# Patient Record
Sex: Male | Born: 1940 | Race: White | Hispanic: No | Marital: Single | Smoking: Current every day smoker
Health system: Southern US, Community
[De-identification: ages and names within clinical notes are randomized; demographics above are authoritative.]

## PROBLEM LIST (undated history)

## (undated) DIAGNOSIS — F17200 Nicotine dependence, unspecified, uncomplicated: Secondary | ICD-10-CM

## (undated) DIAGNOSIS — J449 Chronic obstructive pulmonary disease, unspecified: Secondary | ICD-10-CM

## (undated) DIAGNOSIS — Z86010 Personal history of colonic polyps: Secondary | ICD-10-CM

## (undated) DIAGNOSIS — R7309 Other abnormal glucose: Secondary | ICD-10-CM

## (undated) DIAGNOSIS — R042 Hemoptysis: Secondary | ICD-10-CM

## (undated) DIAGNOSIS — J438 Other emphysema: Secondary | ICD-10-CM

## (undated) DIAGNOSIS — Z8546 Personal history of malignant neoplasm of prostate: Secondary | ICD-10-CM

## (undated) DIAGNOSIS — E278 Other specified disorders of adrenal gland: Secondary | ICD-10-CM

## (undated) DIAGNOSIS — I10 Essential (primary) hypertension: Secondary | ICD-10-CM

## (undated) DIAGNOSIS — K219 Gastro-esophageal reflux disease without esophagitis: Secondary | ICD-10-CM

## (undated) HISTORY — DX: Gastro-esophageal reflux disease without esophagitis: K21.9

## (undated) HISTORY — DX: Essential (primary) hypertension: I10

## (undated) HISTORY — DX: Other specified disorders of adrenal gland: E27.8

## (undated) HISTORY — PX: CATARACT EXTRACTION: SUR2

## (undated) HISTORY — DX: Personal history of malignant neoplasm of prostate: Z85.46

## (undated) HISTORY — DX: Hemoptysis: R04.2

## (undated) HISTORY — PX: APPENDECTOMY: SHX54

## (undated) HISTORY — PX: PROSTATE SURGERY: SHX751

## (undated) HISTORY — DX: Chronic obstructive pulmonary disease, unspecified: J44.9

## (undated) HISTORY — DX: Personal history of colonic polyps: Z86.010

## (undated) HISTORY — DX: Other abnormal glucose: R73.09

## (undated) HISTORY — DX: Other emphysema: J43.8

## (undated) HISTORY — DX: Nicotine dependence, unspecified, uncomplicated: F17.200

---

## 2004-03-06 ENCOUNTER — Ambulatory Visit: Payer: Self-pay | Admitting: Internal Medicine

## 2004-03-17 ENCOUNTER — Ambulatory Visit: Payer: Self-pay | Admitting: Internal Medicine

## 2004-03-31 ENCOUNTER — Ambulatory Visit: Payer: Self-pay | Admitting: Internal Medicine

## 2004-06-17 ENCOUNTER — Ambulatory Visit: Payer: Self-pay | Admitting: Internal Medicine

## 2005-03-23 ENCOUNTER — Ambulatory Visit: Payer: Self-pay | Admitting: Internal Medicine

## 2006-05-18 ENCOUNTER — Ambulatory Visit: Payer: Self-pay | Admitting: Internal Medicine

## 2006-05-18 LAB — CONVERTED CEMR LAB
ALT: 17 units/L (ref 0–40)
AST: 17 units/L (ref 0–37)
Albumin: 4 g/dL (ref 3.5–5.2)
Alkaline Phosphatase: 92 units/L (ref 39–117)
BUN: 9 mg/dL (ref 6–23)
Basophils Absolute: 0 10*3/uL (ref 0.0–0.1)
Basophils Relative: 0 % (ref 0.0–1.0)
Bilirubin, Direct: 0.2 mg/dL (ref 0.0–0.3)
CO2: 29 meq/L (ref 19–32)
Calcium: 8.9 mg/dL (ref 8.4–10.5)
Chloride: 104 meq/L (ref 96–112)
Cholesterol: 148 mg/dL (ref 0–200)
Creatinine, Ser: 0.9 mg/dL (ref 0.4–1.5)
Eosinophils Absolute: 0.2 10*3/uL (ref 0.0–0.6)
Eosinophils Relative: 2.1 % (ref 0.0–5.0)
GFR calc Af Amer: 109 mL/min
GFR calc non Af Amer: 90 mL/min
Glucose, Bld: 90 mg/dL (ref 70–99)
HCT: 45.3 % (ref 39.0–52.0)
HDL: 26.5 mg/dL — ABNORMAL LOW (ref >39.0)
Hemoglobin: 16.1 g/dL (ref 13.0–17.0)
LDL Cholesterol: 95 mg/dL (ref 0–99)
Lymphocytes Relative: 20.7 % (ref 12.0–46.0)
MCHC: 35.4 g/dL (ref 30.0–36.0)
MCV: 92.1 fL (ref 78.0–100.0)
Monocytes Absolute: 0.6 10*3/uL (ref 0.2–0.7)
Monocytes Relative: 7.1 % (ref 3.0–11.0)
Neutro Abs: 5.4 10*3/uL (ref 1.4–7.7)
Neutrophils Relative %: 70.1 % (ref 43.0–77.0)
PSA: 5.14 ng/mL — ABNORMAL HIGH (ref 0.10–4.00)
Platelets: 268 10*3/uL (ref 150–400)
Potassium: 3.8 meq/L (ref 3.5–5.1)
RBC: 4.92 M/uL (ref 4.22–5.81)
RDW: 12.9 % (ref 11.5–14.6)
Sodium: 139 meq/L (ref 135–145)
TSH: 1.35 microintl units/mL (ref 0.35–5.50)
Total Bilirubin: 1 mg/dL (ref 0.3–1.2)
Total CHOL/HDL Ratio: 5.6
Total Protein: 7.3 g/dL (ref 6.0–8.3)
Triglycerides: 132 mg/dL (ref 0–149)
VLDL: 26 mg/dL (ref 0–40)
WBC: 7.8 10*3/uL (ref 4.5–10.5)

## 2006-06-14 ENCOUNTER — Ambulatory Visit: Payer: Self-pay | Admitting: Internal Medicine

## 2006-06-14 LAB — CONVERTED CEMR LAB
PSA, Free Pct: 9 — ABNORMAL LOW (ref >25)
PSA, Free: 0.4 ng/mL
PSA: 4.67 ng/mL — ABNORMAL HIGH (ref 0.10–4.00)

## 2006-09-15 ENCOUNTER — Inpatient Hospital Stay (HOSPITAL_COMMUNITY): Admission: RE | Admit: 2006-09-15 | Discharge: 2006-09-16 | Payer: Self-pay | Admitting: Urology

## 2006-09-15 ENCOUNTER — Encounter: Payer: Self-pay | Admitting: Urology

## 2006-10-11 DIAGNOSIS — D126 Benign neoplasm of colon, unspecified: Secondary | ICD-10-CM | POA: Insufficient documentation

## 2006-10-11 DIAGNOSIS — Z860100 Personal history of colon polyps, unspecified: Secondary | ICD-10-CM

## 2006-10-11 HISTORY — DX: Personal history of colonic polyps: Z86.010

## 2006-10-11 HISTORY — DX: Personal history of colon polyps, unspecified: Z86.0100

## 2007-01-30 ENCOUNTER — Ambulatory Visit: Payer: Self-pay | Admitting: Internal Medicine

## 2007-01-30 DIAGNOSIS — F17200 Nicotine dependence, unspecified, uncomplicated: Secondary | ICD-10-CM

## 2007-01-30 DIAGNOSIS — Z8546 Personal history of malignant neoplasm of prostate: Secondary | ICD-10-CM

## 2007-01-30 HISTORY — DX: Personal history of malignant neoplasm of prostate: Z85.46

## 2007-01-30 HISTORY — DX: Nicotine dependence, unspecified, uncomplicated: F17.200

## 2007-02-01 ENCOUNTER — Encounter: Payer: Self-pay | Admitting: Internal Medicine

## 2007-05-02 ENCOUNTER — Ambulatory Visit: Payer: Self-pay | Admitting: Internal Medicine

## 2007-05-03 ENCOUNTER — Telehealth: Payer: Self-pay | Admitting: Internal Medicine

## 2007-05-03 LAB — CONVERTED CEMR LAB
ALT: 14 units/L (ref 0–53)
AST: 15 units/L (ref 0–37)
Alkaline Phosphatase: 97 units/L (ref 39–117)
Basophils Absolute: 0 10*3/uL (ref 0.0–0.1)
Basophils Relative: 0.1 % (ref 0.0–1.0)
Bilirubin, Direct: 0.3 mg/dL (ref 0.0–0.3)
CO2: 31 meq/L (ref 19–32)
Chloride: 102 meq/L (ref 96–112)
Creatinine, Ser: 0.9 mg/dL (ref 0.4–1.5)
Eosinophils Absolute: 0.2 10*3/uL (ref 0.0–0.7)
GFR calc non Af Amer: 89 mL/min
HDL: 24.6 mg/dL — ABNORMAL LOW (ref >39.0)
LDL Cholesterol: 103 mg/dL — ABNORMAL HIGH (ref 0–99)
Lymphocytes Relative: 17.8 % (ref 12.0–46.0)
MCHC: 33.7 g/dL (ref 30.0–36.0)
MCV: 94.9 fL (ref 78.0–100.0)
Neutrophils Relative %: 71.5 % (ref 43.0–77.0)
Platelets: 250 10*3/uL (ref 150–400)
Potassium: 4.2 meq/L (ref 3.5–5.1)
RBC: 4.92 M/uL (ref 4.22–5.81)
Total Bilirubin: 1.3 mg/dL — ABNORMAL HIGH (ref 0.3–1.2)
VLDL: 15 mg/dL (ref 0–40)
WBC: 6.9 10*3/uL (ref 4.5–10.5)

## 2007-05-04 ENCOUNTER — Ambulatory Visit: Payer: Self-pay | Admitting: Internal Medicine

## 2007-05-08 ENCOUNTER — Encounter: Payer: Self-pay | Admitting: Internal Medicine

## 2007-05-15 ENCOUNTER — Ambulatory Visit: Payer: Self-pay | Admitting: Internal Medicine

## 2007-05-15 ENCOUNTER — Telehealth: Payer: Self-pay | Admitting: Internal Medicine

## 2007-08-04 ENCOUNTER — Ambulatory Visit: Payer: Self-pay | Admitting: Internal Medicine

## 2007-08-04 DIAGNOSIS — I10 Essential (primary) hypertension: Secondary | ICD-10-CM | POA: Insufficient documentation

## 2007-08-04 HISTORY — DX: Essential (primary) hypertension: I10

## 2007-08-25 ENCOUNTER — Encounter: Payer: Self-pay | Admitting: Internal Medicine

## 2007-10-10 ENCOUNTER — Ambulatory Visit: Payer: Self-pay | Admitting: Internal Medicine

## 2007-12-07 ENCOUNTER — Telehealth: Payer: Self-pay | Admitting: Internal Medicine

## 2008-02-09 ENCOUNTER — Ambulatory Visit: Payer: Self-pay | Admitting: Family Medicine

## 2008-02-21 ENCOUNTER — Encounter: Payer: Self-pay | Admitting: Internal Medicine

## 2008-04-11 ENCOUNTER — Ambulatory Visit: Payer: Self-pay | Admitting: Internal Medicine

## 2008-04-12 LAB — CONVERTED CEMR LAB
BUN: 12 mg/dL (ref 6–23)
CO2: 33 meq/L — ABNORMAL HIGH (ref 19–32)
Calcium: 9.2 mg/dL (ref 8.4–10.5)
Chloride: 98 meq/L (ref 96–112)
Creatinine, Ser: 1 mg/dL (ref 0.4–1.5)
Glucose, Bld: 132 mg/dL — ABNORMAL HIGH (ref 70–99)

## 2008-07-22 ENCOUNTER — Telehealth: Payer: Self-pay | Admitting: Internal Medicine

## 2008-08-19 ENCOUNTER — Encounter: Payer: Self-pay | Admitting: Internal Medicine

## 2008-10-01 ENCOUNTER — Ambulatory Visit: Payer: Self-pay | Admitting: Internal Medicine

## 2008-10-02 LAB — CONVERTED CEMR LAB
BUN: 18 mg/dL (ref 6–23)
Calcium: 8.8 mg/dL (ref 8.4–10.5)
Creatinine, Ser: 1.1 mg/dL (ref 0.4–1.5)
GFR calc non Af Amer: 70.62 mL/min (ref >60)
Glucose, Bld: 104 mg/dL — ABNORMAL HIGH (ref 70–99)
Potassium: 3.2 meq/L — ABNORMAL LOW (ref 3.5–5.1)

## 2008-12-17 ENCOUNTER — Emergency Department (HOSPITAL_COMMUNITY): Admission: EM | Admit: 2008-12-17 | Discharge: 2008-12-17 | Payer: Self-pay | Admitting: Emergency Medicine

## 2008-12-17 ENCOUNTER — Encounter (INDEPENDENT_AMBULATORY_CARE_PROVIDER_SITE_OTHER): Payer: Self-pay | Admitting: *Deleted

## 2008-12-17 ENCOUNTER — Telehealth: Payer: Self-pay | Admitting: Internal Medicine

## 2008-12-24 ENCOUNTER — Ambulatory Visit: Payer: Self-pay | Admitting: Family Medicine

## 2009-01-06 ENCOUNTER — Ambulatory Visit: Payer: Self-pay | Admitting: Internal Medicine

## 2009-02-28 ENCOUNTER — Ambulatory Visit: Payer: Self-pay | Admitting: Internal Medicine

## 2009-03-10 ENCOUNTER — Telehealth: Payer: Self-pay | Admitting: Internal Medicine

## 2009-03-11 ENCOUNTER — Ambulatory Visit: Payer: Self-pay | Admitting: Internal Medicine

## 2009-03-31 ENCOUNTER — Ambulatory Visit: Payer: Self-pay | Admitting: Internal Medicine

## 2009-03-31 DIAGNOSIS — J441 Chronic obstructive pulmonary disease with (acute) exacerbation: Secondary | ICD-10-CM

## 2009-03-31 DIAGNOSIS — J449 Chronic obstructive pulmonary disease, unspecified: Secondary | ICD-10-CM

## 2009-03-31 DIAGNOSIS — J4489 Other specified chronic obstructive pulmonary disease: Secondary | ICD-10-CM

## 2009-03-31 HISTORY — DX: Other specified chronic obstructive pulmonary disease: J44.89

## 2009-03-31 HISTORY — DX: Chronic obstructive pulmonary disease, unspecified: J44.9

## 2009-04-01 LAB — CONVERTED CEMR LAB
CO2: 34 meq/L — ABNORMAL HIGH (ref 19–32)
Calcium: 8.9 mg/dL (ref 8.4–10.5)
Creatinine, Ser: 1 mg/dL (ref 0.4–1.5)
GFR calc non Af Amer: 78.72 mL/min (ref >60)
Sodium: 142 meq/L (ref 135–145)

## 2009-05-06 ENCOUNTER — Telehealth: Payer: Self-pay | Admitting: Internal Medicine

## 2009-06-11 ENCOUNTER — Ambulatory Visit: Payer: Self-pay | Admitting: Internal Medicine

## 2009-06-12 LAB — CONVERTED CEMR LAB
Basophils Absolute: 0 10*3/uL (ref 0.0–0.1)
Basophils Relative: 0.1 % (ref 0.0–3.0)
Eosinophils Absolute: 0.1 10*3/uL (ref 0.0–0.7)
Lipase: 21 units/L (ref 11.0–59.0)
Lymphocytes Relative: 7.2 % — ABNORMAL LOW (ref 12.0–46.0)
MCHC: 35.4 g/dL (ref 30.0–36.0)
Monocytes Relative: 4.3 % (ref 3.0–12.0)
Neutrophils Relative %: 88 % — ABNORMAL HIGH (ref 43.0–77.0)
RBC: 5.04 M/uL (ref 4.22–5.81)

## 2009-06-13 ENCOUNTER — Telehealth: Payer: Self-pay | Admitting: Internal Medicine

## 2009-07-30 ENCOUNTER — Telehealth: Payer: Self-pay | Admitting: Internal Medicine

## 2009-07-30 DIAGNOSIS — R042 Hemoptysis: Secondary | ICD-10-CM | POA: Insufficient documentation

## 2009-07-30 HISTORY — DX: Hemoptysis: R04.2

## 2009-07-31 ENCOUNTER — Ambulatory Visit: Payer: Self-pay | Admitting: Internal Medicine

## 2009-08-10 ENCOUNTER — Encounter (INDEPENDENT_AMBULATORY_CARE_PROVIDER_SITE_OTHER): Payer: Self-pay | Admitting: *Deleted

## 2009-08-10 ENCOUNTER — Emergency Department (HOSPITAL_COMMUNITY): Admission: EM | Admit: 2009-08-10 | Discharge: 2009-08-10 | Payer: Self-pay | Admitting: Emergency Medicine

## 2009-08-12 ENCOUNTER — Ambulatory Visit: Payer: Self-pay | Admitting: Family Medicine

## 2009-08-12 DIAGNOSIS — E278 Other specified disorders of adrenal gland: Secondary | ICD-10-CM

## 2009-08-12 DIAGNOSIS — R739 Hyperglycemia, unspecified: Secondary | ICD-10-CM

## 2009-08-12 DIAGNOSIS — R7309 Other abnormal glucose: Secondary | ICD-10-CM

## 2009-08-12 HISTORY — DX: Other abnormal glucose: R73.09

## 2009-08-12 HISTORY — DX: Other specified disorders of adrenal gland: E27.8

## 2009-08-12 LAB — CONVERTED CEMR LAB
CO2: 32 meq/L (ref 19–32)
Chloride: 105 meq/L (ref 96–112)
Eosinophils Relative: 2.5 % (ref 0.0–5.0)
Glucose, Bld: 101 mg/dL — ABNORMAL HIGH (ref 70–99)
HCT: 43.3 % (ref 39.0–52.0)
Monocytes Relative: 9.4 % (ref 3.0–12.0)
Neutrophils Relative %: 75.1 % (ref 43.0–77.0)
Platelets: 268 10*3/uL (ref 150.0–400.0)
Potassium: 4.2 meq/L (ref 3.5–5.1)
RBC: 4.48 M/uL (ref 4.22–5.81)
Sodium: 140 meq/L (ref 135–145)
WBC: 8.4 10*3/uL (ref 4.5–10.5)

## 2009-08-15 ENCOUNTER — Ambulatory Visit: Payer: Self-pay | Admitting: Internal Medicine

## 2009-08-18 ENCOUNTER — Encounter: Admission: RE | Admit: 2009-08-18 | Discharge: 2009-08-18 | Payer: Self-pay | Admitting: Internal Medicine

## 2009-08-18 ENCOUNTER — Telehealth: Payer: Self-pay | Admitting: Internal Medicine

## 2009-09-09 ENCOUNTER — Ambulatory Visit: Payer: Self-pay | Admitting: Internal Medicine

## 2009-09-09 DIAGNOSIS — K219 Gastro-esophageal reflux disease without esophagitis: Secondary | ICD-10-CM

## 2009-09-09 HISTORY — DX: Gastro-esophageal reflux disease without esophagitis: K21.9

## 2009-09-24 ENCOUNTER — Encounter: Payer: Self-pay | Admitting: Internal Medicine

## 2009-10-02 ENCOUNTER — Ambulatory Visit: Payer: Self-pay | Admitting: Internal Medicine

## 2009-10-03 LAB — CONVERTED CEMR LAB
AST: 15 units/L (ref 0–37)
Basophils Relative: 0.6 % (ref 0.0–3.0)
CO2: 30 meq/L (ref 19–32)
Calcium: 9.1 mg/dL (ref 8.4–10.5)
Eosinophils Relative: 3.3 % (ref 0.0–5.0)
Glucose, Bld: 99 mg/dL (ref 70–99)
HCT: 42.9 % (ref 39.0–52.0)
HDL: 25 mg/dL — ABNORMAL LOW (ref >39.00)
Hemoglobin: 14.8 g/dL (ref 13.0–17.0)
Lymphs Abs: 1 10*3/uL (ref 0.7–4.0)
MCV: 96.6 fL (ref 78.0–100.0)
Monocytes Absolute: 0.5 10*3/uL (ref 0.1–1.0)
Neutrophils Relative %: 69.7 % (ref 43.0–77.0)
RBC: 4.44 M/uL (ref 4.22–5.81)
Sodium: 142 meq/L (ref 135–145)
Total Bilirubin: 0.9 mg/dL (ref 0.3–1.2)
Total CHOL/HDL Ratio: 5
WBC: 5.8 10*3/uL (ref 4.5–10.5)

## 2009-10-08 ENCOUNTER — Ambulatory Visit: Payer: Self-pay | Admitting: Internal Medicine

## 2009-10-08 LAB — HM COLONOSCOPY

## 2009-10-10 ENCOUNTER — Ambulatory Visit: Payer: Self-pay | Admitting: Cardiology

## 2009-10-12 ENCOUNTER — Encounter: Payer: Self-pay | Admitting: Internal Medicine

## 2009-10-16 ENCOUNTER — Ambulatory Visit: Payer: Self-pay | Admitting: Pulmonary Disease

## 2009-10-16 DIAGNOSIS — J438 Other emphysema: Secondary | ICD-10-CM

## 2009-10-16 DIAGNOSIS — R930 Abnormal findings on diagnostic imaging of skull and head, not elsewhere classified: Secondary | ICD-10-CM

## 2009-10-16 HISTORY — DX: Other emphysema: J43.8

## 2009-10-21 ENCOUNTER — Ambulatory Visit: Payer: Self-pay | Admitting: Pulmonary Disease

## 2010-02-09 ENCOUNTER — Ambulatory Visit: Payer: Self-pay | Admitting: Cardiology

## 2010-02-24 NOTE — Assessment & Plan Note (Signed)
Summary: congestion/fever/dm   Vital Signs:  Patient profile:   70 year old male Temp:     98.4 degrees F Pulse rate:   68 / minute Resp:     12 per minute BP sitting:   122 / 64  (left arm)  Vitals Entered By: Gladis Riffle, RN (February 28, 2009 12:32 PM) CC: c/o head congestion, cough, fever 102 yesterday; sxs since 02/25/09 Is Patient Diabetic? No   CC:  c/o head congestion, cough, and fever 102 yesterday; sxs since 02/25/09.  History of Present Illness: 3 day hx of illness sudden onset fever chills, sinus congestion.  sxs improving but developed a cough---bothersome at night sinus sxs improved with OTC sinus meds/spray Fever up to 58 Son and grandsons with similar sxs 2 weeks  All other systems reviewed and were negative   Preventive Screening-Counseling & Management  Alcohol-Tobacco     Smoking Status: current     Smoking Cessation Counseling: yes     Packs/Day: 0.5     Year Started: 1958  Current Problems (verified): 1)  Hypertension  (ICD-401.9) 2)  Preventive Health Care  (ICD-V70.0) 3)  Prostate Cancer, Hx of  (ICD-V10.46) 4)  Tobacco Use  (ICD-305.1) 5)  Colonic Polyps, Hx of  (ICD-V12.72)  Current Medications (verified): 1)  Aspirin 81 Mg  Tbec (Aspirin) .... One By Mouth Every Day 2)  Lisinopril 20 Mg Tabs (Lisinopril) .... Take 1 Tablet By Mouth Once A Day 3)  Ibuprofen 200 Mg Tabs (Ibuprofen) .... As Needed  Allergies: 1)  ! Pcn  Past History:  Past Medical History: Last updated: 01/06/2009 Colonic polyps, hx of Prostate cancer, hx of-8/08 Hypertension tobacco abuse history of hemoptysis  Past Surgical History: Last updated: 04/11/2008 Appendectomy Prostatectomy 09/02/2006  Family History: Last updated: 08/04/2007 Family History of Arthritis Family History Breast cancer 1st degree relative <50 Family History of Colon CA 1st degree relative <60 Family History Lung cancer Family History of Stroke M 1st degree relative <50 Family History  Hypertension-mother  Social History: Last updated: 02/09/2008 Occupation: Single Retired Current Smoker  Risk Factors: Smoking Status: current (02/28/2009) Packs/Day: 0.5 (02/28/2009)  Social History: Packs/Day:  0.5  Physical Exam  General:  Well-developed,well-nourished,in no acute distress; alert,appropriate and cooperative throughout examination Head:  normocephalic and atraumatic.   Eyes:  pupils equal and pupils round.   Neck:  No deformities, masses, or tenderness noted. Chest Wall:  No deformities, masses, tenderness or gynecomastia noted. Lungs:  Normal respiratory effort, chest expands symmetrically. Lungs are clear to auscultation, no crackles or wheezes. Heart:  Normal rate and regular rhythm. S1 and S2 normal without gallop, murmur, click, rub or other extra sounds.   Impression & Recommendations:  Problem # 1:  INFLUENZA, WITH RESPIRATORY SYMPTOMS (ICD-487.1) by hx symptomatic care only side effects discussed call for increased sxs  Complete Medication List: 1)  Aspirin 81 Mg Tbec (Aspirin) .... One by mouth every day 2)  Lisinopril 20 Mg Tabs (Lisinopril) .... Take 1 tablet by mouth once a day 3)  Ibuprofen 200 Mg Tabs (Ibuprofen) .... As needed 4)  Hydromet 5-1.5 Mg/37ml Syrp (Hydrocodone-homatropine) .[name].. 1 tsp three times a day as needed cough Prescriptions: HYDROMET 5-1.5 MG/5ML SYRP (HYDROCODONE-HOMATROPINE) 1 tsp three times a day as needed cough  #120 cc x 0   Entered and Authorized by:   Birdie Sons MD   Signed by:   Birdie Sons MD on 02/28/2009   Method used:   Print then Give to Patient  RxID:   [id number]

## 2010-02-24 NOTE — Procedures (Signed)
Summary: Colonoscopy  Patient: Joe Hill Note: All result statuses are Final unless otherwise noted.  Tests: (1) Colonoscopy (COL)   COL Colonoscopy           DONE     [facility]     [address]     [place], [place]  [address]           COLONOSCOPY PROCEDURE REPORT           PATIENT:  Joe Hill, Joe Hill  MR#:  [id number]     BIRTHDATE:  [birth year], 69 yrs. old  GENDER:  male     ENDOSCOPIST:  Wilhemina Bonito. Eda Keys, MD     REF. BY:  Surveillance Program Recall,     PROCEDURE DATE:  10/08/2009     PROCEDURE:  Colonoscopy with snare polypectomy x 9;     EXTENDED SERVICE FOR TIME (>30     MIN) AND MULTIPLE POLYPECTOMIES(9)     ASA CLASS:  Class II     INDICATIONS:  history of pre-cancerous (adenomatous) colon polyps,     surveillance and high-risk screening ; index exam 03-2004 w/ small     adenomas     MEDICATIONS:   Fentanyl 75 mcg IV, Versed 9 mg IV           DESCRIPTION OF PROCEDURE:   After the risks benefits and     alternatives of the procedure were thoroughly explained, informed     consent was obtained.  Digital rectal exam was performed and     revealed no abnormalities.   The LB CF-[id number] endoscope     was introduced through the anus and advanced to the cecum, which     was identified by both the appendix and ileocecal valve, without     limitations.TIME TO CECUM = 5:10 MIN.  The quality of the prep was     excellent, using MoviPrep.  The instrument was then slowly     withdrawn (TIME = 24;11 MIN) as the colon was fully examined.     <<PROCEDUREIMAGES>>           FINDINGS:  There were multiple polyps measuring between 2mm and     7mm identified (cecum 2, ascending 4, descending 1, rectal 2 hp     like) and removed. Polyps were snared without cautery. Retrieval     was successful.   Moderate diverticulosis was found in the sigmoid     colon.   Retroflexed views in the rectum revealed internal     hemorrhoids.    The scope was then withdrawn from the  patient and     the procedure completed.           COMPLICATIONS:  None           ENDOSCOPIC IMPRESSION:     1) Polyps, multiple (9) - removed     2) Moderate diverticulosis in the sigmoid colon     3) Internal hemorrhoids           RECOMMENDATIONS:     1) Follow up colonoscopy in 3 years           ______________________________     Wilhemina Bonito. Eda Keys, MD           CC:  Lindley Magnus, MD; The Patient           n.     eSIGNED:   Wilhemina Bonito. Eda Keys at 10/08/2009 03:33 PM  Joe Hill, Joe Hill, [id number]  Note: An exclamation mark (!) indicates a result that was not dispersed into the flowsheet. Document Creation Date: 10/08/2009 3:34 PM _______________________________________________________________________  (1) Order result status: Final Collection or observation date-time: 10/08/2009 15:21 Requested date-time:  Receipt date-time:  Reported date-time:  Referring Physician:   Ordering Physician: Fransico Setters [phone]) Specimen Source:  Source: Launa Grill Order Number: [id number] Lab site:   Appended Document: Colonoscopy recall     Procedures Next Due Date:    Colonoscopy: 09/2012

## 2010-02-24 NOTE — Assessment & Plan Note (Signed)
Summary: Post ER appt/dm   Vital Signs:  Patient profile:   70 year old male Height:      69.75 inches (177.16 cm) Weight:      205.31 pounds (93.32 kg) O2 Sat:      97 % on Room air Temp:     98.2 degrees F (36.78 degrees C) oral Pulse rate:   80 / minute BP sitting:   120 / 72  (left arm) Cuff size:   large  Vitals Entered By: Josph Macho RMA (August 12, 2009 8:45 AM)  O2 Flow:  Room air CC: Post ER appt/ CF Is Patient Diabetic? No   History of Present Illness: Patient in today for ER follow up. Patient presented to ER on Saturday with epigastric pain that was unremitting. He had over eaten spaghetti that night. His belly had felt over extended and tight at bedtime. He lied down went to sleep and woke up an hour later with severe pain. By the time he hit the ER he was nauseous and diaphoretic. His work up included a CT but it had to be without contrast because he tried to take the by mouth contrast twice and vomitting both times.  CT Abd/Pelvis w/o showed b/l liver and renal cysts, b/l Adrenal nodules and MRI was recommended to further evaluation. No sign of bowel obstruction/diverticulitis. Cardiac and pancreatic enzymes were normal. He was started on Prilosec and asked to eat a bland diet and he notes his symptoms have greatly improved. He reports his pain on Sat was 6 of 10 and is now 2 of 10 at most and has moved. It was localized to epigastrium/slightly in RUQ and sharp and is now more of a achy strained muscle feel and across his mid abdomen. Of note he usually has a BM roughly every 3rd day and has not had a BM now in 6 days. He denies any CP/palp/malaise/SOB/f/c/diarrhea/bloody or tarry stool.  Current Medications (verified): 1)  Aspirin 325 Mg Tabs (Aspirin) .... Once Daily 2)  Lisinopril 20 Mg Tabs (Lisinopril) .... Take 1 Tablet By Mouth Once A Day 3)  Prilosec 20 Mg Cpdr (Omeprazole) .... 2 A Day For 3 Days, Then 1 A Day Till Finished  Allergies (verified): 1)  !  Pcn  Past History:  Past medical history reviewed for relevance to current acute and chronic problems. Social history (including risk factors) reviewed for relevance to current acute and chronic problems.  Past Medical History: Reviewed history from 03/31/2009 and no changes required. Colonic polyps, hx of Prostate cancer, hx of-8/08 Hypertension tobacco abuse history of hemoptysis COPD  Social History: Reviewed history from 02/09/2008 and no changes required. Occupation: Single Retired Current Smoker  Review of Systems      See HPI  Physical Exam  General:  Well-developed,well-nourished,in no acute distress; alert,appropriate and cooperative throughout examination Head:  Normocephalic and atraumatic without obvious abnormalities. No apparent alopecia or balding. Eyes:  No corneal or conjunctival inflammation noted. EOMI.  Mouth:  Oral mucosa and oropharynx without lesions or exudates.  Teeth in good repair. Neck:  No deformities, masses, or tenderness noted. Lungs:  Normal respiratory effort, chest expands symmetrically. Lungs are clear to auscultation, no crackles or wheezes. Heart:  Normal rate and regular rhythm. S1 and S2 normal without gallop, murmur, click, rub or other extra sounds. Abdomen:  Bowel sounds positive,abdomen soft and non-tender without masses, organomegaly or hernias noted. Obese Extremities:  No clubbing, cyanosis, edema, or deformity noted with normal full range of motion  of all joints.   Psych:  Cognition and judgment appear intact. Alert and cooperative with normal attention span and concentration. No apparent delusions, illusions, hallucinations   Impression & Recommendations:  Problem # 1:  EPIGASTRIC PAIN (ICD-789.06)  Orders: Venipuncture (16109) Specimen Handling (60454) TLB-CBC Platelet - w/Differential (85025-CBCD) TLB-H. Pylori Abs(Helicobacter Pylori) (86677-HELICO) Radiology Referral (Radiology) Responding to Prilosec and bland  diet, cont Bland diet for one more week and Prilosec for next month and then as needed. 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 Take 1 tablet by mouth once a day Well controlled at today's visit  Problem # 5:  HYPERGLYCEMIA (ICD-790.29)  Orders: Venipuncture (09811) Specimen Handling (91478) TLB-CBC Platelet - w/Differential (85025-CBCD) TLB-BMP (Basic Metabolic Panel-BMET) (80048-METABOL) TLB-A1C / Hgb A1C (Glycohemoglobin) (83036-A1C) TLB-H. Pylori Abs(Helicobacter Pylori) (86677-HELICO) Radiology Referral (Radiology) Noted in ER, hgba1c normal today, decrease simple carb intake continue to monitor  Complete Medication List: 1)  Aspirin 325 Mg Tabs (Aspirin) .... Once daily 2)  Lisinopril 20 Mg Tabs (Lisinopril) .... Take 1 tablet by mouth once a day 3)  Prilosec 20 Mg Cpdr (Omeprazole) .[name].. 1 tab by mouth once daily  Patient Instructions: 1)  Please schedule a follow-up appointment in 1 month after MR to discuss results and evaluate for resolution of abdominal discomfort. 2)  Continue bland diet for 1 more week and Omeprazole for one more month. 3)  Call with any worsening or concerning symptoms. 4)  Constipation: MOM 2 tbls by mouth today if no results in 4 hour repeat with Dulcolax Suppository per Rectum at same time. 5)  Add Benefiber powder 2 tsp in liquid daily to ongoing regimen. Prescriptions: PRILOSEC 20 MG CPDR (OMEPRAZOLE) 1 tab by mouth once daily  #30 x 1   Entered and Authorized by:    Danise Edge MD   Signed by:   Danise Edge MD on 08/12/2009   Method used:   Electronically to        [facility]  [address] [phone]* (retail)       [address]       [place], [place]  [address]       Ph: [phone] or [phone]       Fax: [phone]   RxID:   [id number]

## 2010-02-24 NOTE — Progress Notes (Signed)
Summary: hemoptysis  Phone Note Call from Patient   Caller: Patient Call For: Birdie Sons MD Summary of Call: Returning pt's call.  Pt is taking one  Baby ASA 81 mg plus 1200 mg. q day of Ibuprofen.  Has had 2-3 episodes of hemoptysis in the past few months. Dr. Kirtland Bouchard gave him Aciphex 3 months ago and it helped, but he gave out of meds and stopped.  What does Dr. Cato Mulligan think he should do? [phone] Called again to check on this.  Rudy Jew, RN  July 30, 2009 4:38 PM  Initial call taken by: Lynann Beaver CMA,  July 30, 2009 11:08 AM  Follow-up for Phone Call        is he coughing up blood (hemoptysis) or throwing up blood (hematemesis).  if hemoptysis: CXR  and see me if hematemesis: start omeperazole 20 mg by mouth once daily, stop ibuprofen, stop asa and see me Follow-up by: Birdie Sons MD,  July 30, 2009 5:03 PM  Additional Follow-up for Phone Call Additional follow up Details #1::        Pt is having hemoptysis, and will go for chest xray today.......and see Dr. Cato Mulligan depending on results. No hematemisis. Additional Follow-up by: Lynann Beaver CMA,  July 31, 2009 8:57 AM  New Problems: HEMOPTYSIS UNSPECIFIED (ICD-786.30)   Additional Follow-up for Phone Call Additional follow up Details #2::    See chest xray........next step???? Follow-up by: Lynann Beaver CMA,  July 31, 2009 1:11 PM  New Problems: HEMOPTYSIS UNSPECIFIED (ICD-786.30)   Pt notified.  Appended Document: hemoptysis What should pt do next?  What should we tell him about his chest xray?

## 2010-02-24 NOTE — Assessment & Plan Note (Signed)
Summary: FU hemoptysis/et   Vital Signs:  Patient profile:   70 year old male Weight:      202 pounds Temp:     98.4 degrees F oral Pulse rate:   60 / minute Pulse rhythm:   regular Resp:     12 per minute BP sitting:   110 / 76  (left arm) Cuff size:   regular  Vitals Entered By: Gladis Riffle, RN (August 15, 2009 8:59 AM) CC: FU hemoptysis, put on bland diet--waiting to hear when MRI scheduled Is Patient Diabetic? No   CC:  FU hemoptysis and put on bland diet--waiting to hear when MRI scheduled.  History of Present Illness: reports gi irritation when taking ibuprofen he quit ibuprofen and bleeding has stopped. It's hard to determine whenter he is coughing up blood or whether he is gagging and then spitting up blood. regardless sxs have resolved.  All other systems reviewed and were negative    Preventive Screening-Counseling & Management  Alcohol-Tobacco     Smoking Status: current     Smoking Cessation Counseling: yes     Packs/Day: 0.25     Year Started: 1958  Current Medications (verified): 1)  Aspirin 325 Mg Tabs (Aspirin) .... Once Daily 2)  Lisinopril 20 Mg Tabs (Lisinopril) .... Take 1 Tablet By Mouth Once A Day 3)  Prilosec 20 Mg Cpdr (Omeprazole) .[name].. 1 Tab By Mouth Once Daily  Allergies: 1)  ! Pcn  Past History:  Past Medical History: Last updated: 03/31/2009 Colonic polyps, hx of Prostate cancer, hx of-8/08 Hypertension tobacco abuse history of hemoptysis COPD  Past Surgical History: Last updated: 04/11/2008 Appendectomy Prostatectomy 09/02/2006  Family History: Last updated: 08/04/2007 Family History of Arthritis Family History Breast cancer 1st degree relative <50 Family History of Colon CA 1st degree relative <60 Family History Lung cancer Family History of Stroke M 1st degree relative <50 Family History Hypertension-mother  Social History: Last updated: 02/09/2008 Occupation: Single Retired Current Smoker  Risk Factors: Smoking  Status: current (08/15/2009) Packs/Day: 0.25 (08/15/2009)  Physical Exam  General:  Well-developed,well-nourished,in no acute distress; alert,appropriate and cooperative throughout examination Head:  Normocephalic and atraumatic without obvious abnormalities. No apparent alopecia or balding. Neck:  No deformities, masses, or tenderness noted. Lungs:  Normal respiratory effort, chest expands symmetrically. Lungs are clear to auscultation, no crackles or wheezes.   Impression & Recommendations:  Problem # 1:  EPIGASTRIC PAIN (ICD-789.06)  had some bleeding suspect hematemesis not hemoptysis---reviewed CXR eventhough sxs resolved i think needs furhter eval refer GI--probably needs endoscopy  Orders: Gastroenterology Referral (GI)  Complete Medication List: 1)  Aspirin 325 Mg Tabs (Aspirin) .... Once daily 2)  Lisinopril 20 Mg Tabs (Lisinopril) .... Take 1 tablet by mouth once a day 3)  Prilosec 20 Mg Cpdr (Omeprazole) .[name].. 1 tab by mouth once daily

## 2010-02-24 NOTE — Procedures (Signed)
Summary: Colonoscopy   Colonoscopy  Procedure date:  03/31/2004  Findings:      Results: Polyp. Tubular Adenoma Results: Diverticulosis.       Location:  [facility].    Procedures Next Due Date:    Colonoscopy: 03/2007  Colonoscopy  Procedure date:  03/31/2004  Findings:      Results: Polyp. Tubular Adenoma Results: Diverticulosis.       Location:  [facility].    Procedures Next Due Date:    Colonoscopy: 03/2007 Patient Name: Emmanual, Gauthreaux MRN:  Procedure Procedures: Colonoscopy CPT: 16109.    with polypectomy. CPT: [id number].  Personnel: Endoscopist: Wilhemina Bonito. Marina Goodell, MD.  Referred By: Valetta Mole Swords, MD.  Exam Location: Exam performed in Outpatient Clinic. Outpatient  Patient Consent: Procedure, Alternatives, Risks and Benefits discussed, consent obtained, from patient. Consent was obtained by the RN.  Indications  Average Risk Screening Routine.  History  Current Medications: Patient is not currently taking Coumadin.  Pre-Exam Physical: Performed Mar 31, 2004. Entire physical exam was normal.  Exam Exam: Extent of exam reached: Cecum, extent intended: Cecum.  The cecum was identified by appendiceal orifice and IC valve. Patient position: on left side. Colon retroflexion performed. Images taken. ASA Classification: I. Tolerance: excellent.  Monitoring: Pulse and BP monitoring, Oximetry used. Supplemental O2 given.  Colon Prep Used MIRALAX for colon prep. Prep results: excellent.  Sedation Meds: Patient assessed and found to be appropriate for moderate (conscious) sedation. Fentanyl 75 mcg. given IV. Versed 7 mg. given IV.  Findings POLYP: Descending Colon, Maximum size: 6 mm. sessile polyp. Procedure:  snare without cautery, removed, retrieved, Polyp sent to pathology. ICD9: Colon Polyps: 211.3.  NORMAL EXAM: Cecum to Rectum.  POLYP: Sigmoid Colon, Maximum size: 4 mm. pedunculated polyp. Procedure:  snare without  cautery, removed, retrieved, sent to pathology. ICD9: Colon Polyps: 211.3.  DIVERTICULOSIS: Sigmoid Colon. ICD9: Diverticulosis, Colon: 562.10. Comments: VERY MILD.   Assessment Abnormal examination, see findings above.  Diagnoses: 211.3: Colon Polyps.  562.10: Diverticulosis, Colon.   Events  Unplanned Interventions: No intervention was required.  Unplanned Events: There were no complications. Plans Disposition: After procedure patient sent to recovery. After recovery patient sent home.  Scheduling/Referral: Colonoscopy, to Wilhemina Bonito. Marina Goodell, MD, IN 3 YEARS IF POLYPS ADENOMATOUS,    This report was created from the original endoscopy report, which was reviewed and signed by the above listed endoscopist.   cc:  Birdie Sons, MD      The Patient

## 2010-02-24 NOTE — Assessment & Plan Note (Signed)
Summary: abd. pain/dm   Vital Signs:  Patient profile:   70 year old male Weight:      213 pounds Temp:     98.2 degrees F oral BP sitting:   150 / 80  (right arm) Cuff size:   regular  Vitals Entered By: Duard Brady LPN (Jun 11, 2009 10:17 AM) CC: c/o epigastric pain, no n/v/d or fever , worse when lying down, no BM in 3 days  Is Patient Diabetic? No   CC:  c/o epigastric pain, no n/v/d or fever , worse when lying down, and no BM in 3 days .  History of Present Illness:  70 year old patient who was stable until yesterday when at approximately 5:30 p.m. after dinner began having constant epigastric pain.  There is been no nausea, vomiting, or change in his bowel habits.  No fever or chills.  Has never had any similar pain in the past.  There is no history of pancreatitis, ulcer disease or gallbladder disease.  He has had a remote appendectomy and a laparoscopic prostatectomy.  He has taken Tums without benefit.  He has consumed little food intake. He has a history of hypertension, which has been stable.  Also, has stable COPD, present and is without medication  Preventive Screening-Counseling & Management  Alcohol-Tobacco     Smoking Status: current  Allergies: 1)  ! Pcn  Review of Systems       The patient complains of anorexia and abdominal pain.  The patient denies fever, weight loss, weight gain, vision loss, decreased hearing, hoarseness, chest pain, syncope, dyspnea on exertion, peripheral edema, prolonged cough, headaches, hemoptysis, melena, hematochezia, severe indigestion/heartburn, hematuria, incontinence, genital sores, muscle weakness, suspicious skin lesions, transient blindness, difficulty walking, depression, unusual weight change, abnormal bleeding, enlarged lymph nodes, angioedema, breast masses, and testicular masses.    Physical Exam  General:  Well-developed,well-nourished,in no acute distress; alert,appropriate and cooperative throughout examination;  blood pressure 130/80 Head:  Normocephalic and atraumatic without obvious abnormalities. No apparent alopecia or balding. Eyes:  No corneal or conjunctival inflammation noted. EOMI. Perrla. Funduscopic exam benign, without hemorrhages, exudates or papilledema. Vision grossly normal. anicteric Mouth:  Oral mucosa and oropharynx without lesions or exudates.  Teeth in good repair. Neck:  No deformities, masses, or tenderness noted. Lungs:  Normal respiratory effort, chest expands symmetrically. Lungs are clear to auscultation, no crackles or wheezes. Heart:  Normal rate and regular rhythm. S1 and S2 normal without gallop, murmur, click, rub or other extra sounds. Abdomen:  epigastric tenderness.  Bowel sounds are active.  No guarding or rebound noted.  Surgical scars noted.  No organomegaly   Impression & Recommendations:  Problem # 1:  ABDOMINAL PAIN (ICD-789.00)  will check a CBC, amylase, lipase, and empirically placed on PPI therapy.  He will call if he develops worsening symptoms or if he fails to improve in 24 to 48 hours  Orders: Venipuncture (52841) TLB-CBC Platelet - w/Differential (85025-CBCD) TLB-Amylase (82150-AMYL) TLB-Lipase (83690-LIPASE)  Problem # 2:  COPD (ICD-496)  Problem # 3:  HYPERTENSION (ICD-401.9)  His updated medication list for this problem includes:    Lisinopril 20 Mg Tabs (Lisinopril) .[name]... Take 1 tablet by mouth once a day  Complete Medication List: 1)  Aspirin 81 Mg Tbec (Aspirin) .... One by mouth every day 2)  Lisinopril 20 Mg Tabs (Lisinopril) .... Take 1 tablet by mouth once a day 3)  Ibuprofen 200 Mg Tabs (Ibuprofen) .... As needed  Patient Instructions: 1)  Drink clear liquids  only for the next 24 hours, then slowly add other liquids and food as you  tolerate them. 2)  Aciphex 20 mg twice daily 3)  Call if symptoms intensify or you are not  improved and 24 to 48 hours

## 2010-02-24 NOTE — Letter (Signed)
Summary: Patient Notice- Polyp Results  [facility]  [address] [place], [place] [address]   Phone: [phone]  Fax: [phone]        October 12, 2009 MRN: [id number]    [facility] [address] [place], [place]  [address]    Dear Mr. Villeda,  I am pleased to inform you that the colon polyp(s) removed during your recent colonoscopy was (were) found to be benign (no cancer detected) upon pathologic examination.  I recommend you have a repeat colonoscopy examination in 3 years to look for recurrent polyps, as having colon polyps increases your risk for having recurrent polyps or even colon cancer in the future.  Should you develop new or worsening symptoms of abdominal pain, bowel habit changes or bleeding from the rectum or bowels, please schedule an evaluation with either your primary care physician or with me.  Additional information/recommendations:  __ No further action with gastroenterology is needed at this time. Please      follow-up with your primary care physician for your other healthcare      needs.    Please call us if you are having persistent problems or have questions about your condition that have not been fully answered at this time.  Sincerely,  Hilarie Fredrickson MD  This letter has been electronically signed by your physician.  Appended Document: Patient Notice- Polyp Results letter mailed

## 2010-02-24 NOTE — Progress Notes (Signed)
Summary: triage  Phone Note From Other Clinic Call back at [phone]   Caller: Bjorn Loser, scheduler Call For: Dr. Marina Goodell Reason for Call: Schedule Patient Appt Summary of Call: Dr. Cato Mulligan would like pt worked in asap for GERD and spitting up blood Initial call taken by: Vallarie Mare,  August 18, 2009 10:45 AM    Additional Follow-up for Phone Call Additional follow up Details #2::    pt scheduled with Marina Goodell 09/09/09.  Bjorn Loser will notify pt Follow-up by: Chales Abrahams CMA Duncan Dull),  August 18, 2009 11:16 AM

## 2010-02-24 NOTE — Assessment & Plan Note (Signed)
Summary: 6 month rov/njr rsc bmp/njr   Vital Signs:  Patient profile:   70 year old male Height:      69.75 inches Weight:      212 pounds BMI:     30.75 Pulse rate:   54 / minute Pulse rhythm:   regular Resp:     12 per minute BP sitting:   142 / 74  (left arm) Cuff size:   regular  Vitals Entered By: Gladis Riffle, RN (March 31, 2009 9:02 AM) CC: 6 month rov, fasting--c/o PND at times Is Patient Diabetic? No   CC:  6 month rov and fasting--c/o PND at times.  History of Present Illness:  Follow-Up Visit      This is a 70 year old man who presents for Follow-up visit.  The patient denies chest pain and palpitations.  Since the last visit the patient notes no new problems or concerns (see recent uri/bronchitis---he is much better).  The patient reports taking meds as prescribed.  When questioned about possible medication side effects, the patient notes none.    All other systems reviewed and were negative except chronic PnDr---uses otc meds with success  Preventive Screening-Counseling & Management  Alcohol-Tobacco     Smoking Status: current     Packs/Day: 0.25  Current Problems (verified): 1)  COPD  (ICD-496) 2)  Hypertension  (ICD-401.9) 3)  Preventive Health Care  (ICD-V70.0) 4)  Prostate Cancer, Hx of  (ICD-V10.46) 5)  Tobacco Use  (ICD-305.1) 6)  Colonic Polyps, Hx of  (ICD-V12.72)  Current Medications (verified): 1)  Aspirin 81 Mg  Tbec (Aspirin) .... One By Mouth Every Day 2)  Lisinopril 20 Mg Tabs (Lisinopril) .... Take 1 Tablet By Mouth Once A Day 3)  Ibuprofen 200 Mg Tabs (Ibuprofen) .... As Needed  Allergies: 1)  ! Pcn  Past History:  Past Medical History: Colonic polyps, hx of Prostate cancer, hx of-8/08 Hypertension tobacco abuse history of hemoptysis COPD  Social History: Packs/Day:  0.25  Review of Systems       All other systems reviewed and were negative   Physical Exam  General:  alert and well-developed.   Head:  normocephalic and  atraumatic.   Eyes:  pupils equal and pupils round.   Ears:  R ear normal and L ear normal.   Neck:  No deformities, masses, or tenderness noted. Chest Wall:  No deformities, masses, tenderness or gynecomastia noted. Lungs:  normal respiratory effort, no intercostal retractions, and no accessory muscle use.   Heart:  normal rate and regular rhythm.   Abdomen:  soft, non-tender, normal bowel sounds, no distention, no masses, no guarding, no rigidity, and no rebound tenderness.   Msk:  No deformity or scoliosis noted of thoracic or lumbar spine.   Neurologic:  cranial nerves II-XII intact and gait normal.   Cervical Nodes:  no anterior cervical adenopathy and no posterior cervical adenopathy.   Psych:  memory intact for recent and remote and normally interactive.     Impression & Recommendations:  Problem # 1:  HYPERTENSION (ICD-401.9)  reasonable control continue current medications  His updated medication list for this problem includes:    Lisinopril 20 Mg Tabs (Lisinopril) .[name]... Take 1 tablet by mouth once a day  BP today: 142/74 Prior BP: 122/64 (02/28/2009)  Labs Reviewed: K+: 3.2 (10/01/2008) Creat: : 1.1 (10/01/2008)   Chol: 143 (05/02/2007)   HDL: 24.6 (05/02/2007)   LDL: 103 (05/02/2007)   TG: 75 (05/02/2007)  Orders: Venipuncture (  [address]) TLB-BMP (Basic Metabolic Panel-BMET) (80048-METABOL)  Problem # 2:  TOBACCO USE (ICD-305.1)  Encouraged smoking cessation and discussed different methods for smoking cessation.  he will taper cigarrettes weekly  Complete Medication List: 1)  Aspirin 81 Mg Tbec (Aspirin) .... One by mouth every day 2)  Lisinopril 20 Mg Tabs (Lisinopril) .... Take 1 tablet by mouth once a day 3)  Ibuprofen 200 Mg Tabs (Ibuprofen) .... As needed 4)  Fluticasone Propionate 50 Mcg/act Susp (Fluticasone propionate) .... 2 sprays each nostril once daily  Patient Instructions: 1)  cancel march 23 appt 2)  Please schedule a follow-up appointment in 6  months. CPX Prescriptions: FLUTICASONE PROPIONATE 50 MCG/ACT  SUSP (FLUTICASONE PROPIONATE) 2 sprays each nostril once daily  #1 vial x 3   Entered and Authorized by:   Birdie Sons MD   Signed by:   Birdie Sons MD on 03/31/2009   Method used:   Electronically to        [facility] [address] [phone]* (retail)       [address]       [place], [place]  [address]       Ph: [phone] or [phone]       Fax: [phone]   RxID:   [id number]

## 2010-02-24 NOTE — Procedures (Signed)
Summary: Colon   Colonoscopy  Procedure date:  03/31/2004  Findings:      Location:  [facility].   Patient Name: Joe Hill, Joe Hill MRN:  Procedure Procedures: Colonoscopy CPT: 14782.    with polypectomy. CPT: A3573898.  Personnel: Endoscopist: Wilhemina Bonito. Marina Goodell, MD.  Referred By: Valetta Mole Swords, MD.  Exam Location: Exam performed in Outpatient Clinic. Outpatient  Patient Consent: Procedure, Alternatives, Risks and Benefits discussed, consent obtained, from patient. Consent was obtained by the RN.  Indications  Average Risk Screening Routine.  History  Current Medications: Patient is not currently taking Coumadin.  Pre-Exam Physical: Performed Mar 31, 2004. Entire physical exam was normal.  Exam Exam: Extent of exam reached: Cecum, extent intended: Cecum.  The cecum was identified by appendiceal orifice and IC valve. Patient position: on left side. Colon retroflexion performed. Images taken. ASA Classification: I. Tolerance: excellent.  Monitoring: Pulse and BP monitoring, Oximetry used. Supplemental O2 given.  Colon Prep Used MIRALAX for colon prep. Prep results: excellent.  Sedation Meds: Patient assessed and found to be appropriate for moderate (conscious) sedation. Fentanyl 75 mcg. given IV. Versed 7 mg. given IV.  Findings POLYP: Descending Colon, Maximum size: 6 mm. sessile polyp. Procedure:  snare without cautery, removed, retrieved, Polyp sent to pathology. ICD9: Colon Polyps: 211.3.  NORMAL EXAM: Cecum to Rectum.  POLYP: Sigmoid Colon, Maximum size: 4 mm. pedunculated polyp. Procedure:  snare without cautery, removed, retrieved, sent to pathology. ICD9: Colon Polyps: 211.3.  DIVERTICULOSIS: Sigmoid Colon. ICD9: Diverticulosis, Colon: 562.10. Comments: VERY MILD.   Assessment Abnormal examination, see findings above.  Diagnoses: 211.3: Colon Polyps.  562.10: Diverticulosis, Colon.   Events  Unplanned Interventions: No intervention was  required.  Unplanned Events: There were no complications. Plans Disposition: After procedure patient sent to recovery. After recovery patient sent home.  Scheduling/Referral: Colonoscopy, to Wilhemina Bonito. Marina Goodell, MD, IN 3 YEARS IF POLYPS ADENOMATOUS,

## 2010-02-24 NOTE — Progress Notes (Signed)
Summary: no better  Phone Note Call from Patient   Caller: Patient Call For: Birdie Sons MD Reason for Call: Acute Illness Summary of Call: Pt is no better, and was expecting a call from Dr. Kirtland Bouchard yesterday???? [phone]  Initial call taken by: Lynann Beaver CMA,  Jun 13, 2009 8:52 AM  Follow-up for Phone Call        called and discussed; improved but still mild abdominal pain-now in peri umbilical area- will call Monday am for abd u/s if still symptomatic Follow-up by: Gordy Savers  MD,  Jun 13, 2009 12:45 PM

## 2010-02-24 NOTE — Miscellaneous (Signed)
Summary: Orders Update pft charges  Clinical Lists Changes  Orders: Added new Service order of Carbon Monoxide diffusing w/capacity [phone]) - Signed Added new Service order of Lung Volumes (60454) - Signed Added new Service order of Spirometry (Pre & Post) [phone]) - Signed  Appended Document: Orders Update pft charges please let pt know that pfts show he has moderate emphysema, but would probably improve if he was to quit smoking.  If he is truly asymptomatic with his breathing, would stop smoking and see how things go.  If he thinks his breathing is being affected, would start on inhalers to see if things improved (and still needs to quit smoking).  don't forget about upcoming followup ct chest.  Appended Document: Orders Update pft charges Called and spoke with pt about pft results. PT verbalized understanding of the results. Pt states he will try to stop smoking to see if it helps his breathing before he would try any inhalers. I told pt if his breathing gets worse then to give our office a call back. Pt stated he would give [place] a call back if he is having any problems.  I reminded pt of his upcoming ct chest and pt verbalized understanding of the date.

## 2010-02-24 NOTE — Procedures (Signed)
Summary: Upper Endoscopy  Patient: Joe Hill Note: All result statuses are Final unless otherwise noted.  Tests: (1) Upper Endoscopy (EGD)   EGD Upper Endoscopy       DONE     [facility]     [address]     [place], [place]  [address]           ENDOSCOPY PROCEDURE REPORT           PATIENT:  [name], [name]  MR#:  [id number]     BIRTHDATE:  Mar 17, 1940, 69 yrs. old  GENDER:  male           ENDOSCOPIST:  Wilhemina Bonito. Eda Keys, MD     Referred by:  Office           PROCEDURE DATE:  10/08/2009     PROCEDURE:  EGD, diagnostic     ASA CLASS:  Class II     INDICATIONS:  ? hematemesis (sounded more like recurrent minor     hemoptysis - none x 2 months)           MEDICATIONS:   There was residual sedation effect present from     prior procedure., Versed 1 mg IV     TOPICAL ANESTHETIC:  Exactacain Spray           DESCRIPTION OF PROCEDURE:   After the risks benefits and     alternatives of the procedure were thoroughly explained, informed     consent was obtained.  The LB GIF-[id number] [id number] endoscope was     introduced through the mouth and advanced to the second portion of     the duodenum, without limitations.  The instrument was slowly     withdrawn as the mucosa was fully examined.     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 The esophagus was otherwise normal.  The stomach was     entered and closely examined. The antrum, angularis, and lesser     curvature were well visualized, including a retroflexed view of     the cardia and fundus. The stomach wall was normally distensable.     The scope passed easily through the pylorus into the duodenum.     The duodenal bulb was normal in appearance, as was the postbulbar     duodenum.    Retroflexed views revealed no abnormalities.    The     scope was then withdrawn from the patient and the procedure     completed.           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 May need pulmonary evaluation     if CT abnormal or recurrent problems           ______________________________     Wilhemina Bonito. Eda Keys, MD           CC:  Lindley Magnus, MD, The Patient  n.     eSIGNED:   Wilhemina Bonito. Eda Keys at 10/08/2009 03:46 PM           Karie Soda, [phone]  Note: An exclamation mark (!) indicates a result that was not dispersed into the flowsheet. Document Creation Date: 10/08/2009 3:47 PM _______________________________________________________________________  (1) Order result status: Final Collection or observation date-time: 10/08/2009 15:37 Requested date-time:  Receipt date-time:  Reported date-time:  Referring Physician:   Ordering Physician: Fransico Setters [phone]) Specimen Source:  Source: Launa Grill Order Number: [id number] Lab site:   Appended Document: Orders Update-CT Chest    Clinical Lists Changes  Orders: Added new Test order of CT Chest (CT Chest) - Signed      Appended Document: Upper Endoscopy Called patient and left message for patient to call me tomorrow for date and time of CT.  Appended Document: Upper Endoscopy Pt. notified of CT appt. tomorrow.

## 2010-02-24 NOTE — Assessment & Plan Note (Signed)
Summary: consult for hemoptysis, ?copd   Visit Type:  Initial Consult Copy to:  Joe Hill Primary Provider/Referring Provider:  Birdie Sons, MD  CC:  Pulmonary Consult. [name]  History of Present Illness: The pt is a 70y/o male who I have been asked to see for hemoptysis.  The pt reports "coughing up blood" the first part of the year, and it was unclear whether this was from a GI or pulmonary source.  He was taking a lot of ibuprofen at the time, and the symptom resolved with discontinuation of the ibuprofen.  The medication was later restarted, and the "hemoptysis" recurred.  He underwent upper endo which did not show a GI source for the blood, and subsequently underwent ct chest.  This revealed emphysematous changes, as well as pleural thickening in both apices right greater than left.  The pt has not had further hemoptysis in at least 2 mos.  He describes his prior hemoptysis as being bright red, with streaks to clumps.  He denies any epistaxis issues.  He denies sob except with heavy exertional activities.  He feels that he can walk for miles without getting sob.  Unfortunately, he is still smoking.  Preventive Screening-Counseling & Management  Alcohol-Tobacco     Smoking Status: current     Smoking Cessation Counseling: yes     Packs/Day: 1.0     Tobacco Counseling: to quit use of tobacco products  Current Medications (verified): 1)  Aspirin 325 Mg Tabs (Aspirin) .... Once Daily 2)  Lisinopril 20 Mg Tabs (Lisinopril) .... Take 1 Tablet By Mouth Once A Day 3)  Prilosec 20 Mg Cpdr (Omeprazole) .[name].. 1 Tab By Mouth Once Daily  Allergies (verified): 1)  ! Pcn  Past History:  Past Medical History: Colonic polyps, hx of  Tubular Adenoma Prostate cancer, hx of-8/08 Hypertension tobacco abuse Hepatic Cysts  Past Surgical History: Reviewed history from 09/09/2009 and no changes required. Appendectomy Prostatectomy 09/02/2006 Cataract Extraction  Family History: Reviewed history  from 08/04/2007 and no changes required. Family History Breast cancerSister Family History Lung cancer--father Family History Hypertension-mother  Social History: Reviewed history from 09/09/2009 and no changes required. Occupation: PT Dentist Single Retired Current Smoker 1/2 ppd Daily Caffeine Use 1 Illicit Drug Use - no Packs/Day:  1.0  Review of Systems       The patient complains of coughing up blood and indigestion.  The patient denies shortness of breath with activity, shortness of breath at rest, productive cough, non-productive cough, chest pain, irregular heartbeats, acid heartburn, loss of appetite, weight change, abdominal pain, difficulty swallowing, sore throat, tooth/dental problems, headaches, nasal congestion/difficulty breathing through nose, sneezing, itching, ear ache, anxiety, depression, hand/feet swelling, joint stiffness or pain, rash, change in color of mucus, and fever.    Vital Signs:  Patient profile:   70 year old male Height:      70 inches Weight:      207.13 pounds BMI:     29.83 O2 Sat:      98 % on Room air Temp:     97.8 degrees F oral Pulse rate:   49 / minute BP sitting:   132 / 70  (right arm) Cuff size:   regular  Vitals Entered By: [name] (October 16, 2009 11:54 AM)  O2 Flow:  Room air  CC: Pulmonary Consult.  Comments meds and allergies updated Phone number updated [name]  October 16, 2009 11:54 AM    Physical Exam  General:  23 male in  nad Eyes:  PERRLA and EOMI.   Nose:  mildly prominent plexus of vessels on left turbinate, but no bleeding noted.  No lesions or other sources of NP bleeding. Mouth:  clear, no lesions or exudates. Neck:  no jvd, tmg, LN Lungs:  decreased bs, but no wheezing or rhonchi Heart:  rrr, no mrg Abdomen:  soft and nontender, bs+ Extremities:  no edema or cyanosis pulses intact distally Neurologic:  alert and oriented, moves all 4.   Impression &  Recommendations:  Problem # 1:  EMPHYSEMA (ICD-492.8) the pt has significant emphysematous changes on his ct chest that do not necessarily translate into functional issues.  He feels his exertional tolerance is fairly good, even with very heavy exertional activities.  I think he should have pfts for documentation, and I have asked him to stop smoking.  Problem # 2:  CT, CHEST, ABNORMAL (ICD-793.1) the pt has hemoptysis that may or may not be from a pulmonary source.  He has an area of thickening in the right apex that is not overly impressive for a cancer, however does need to be followed.  I would recommend a f/u scan in 4mos.  The pt is also to call me if his hemoptysis recurs.  There is about a 3-4 % incidence of occult endobronchial cancer in pt's over age 14, ongoing smoking, and a nonlocalizing cxr.    Other Orders: Consultation Level IV [phone]) Radiology Referral (Radiology) Pulmonary Referral (Pulmonary) Tobacco use cessation intermediate 3-10 minutes (28413)  Patient Instructions: 1)  will schedule for breathing studies, and let you know the results 2)  will need a followup ct chest in 4mos, and call you with results 3)  please call me if you cough up blood again.

## 2010-02-24 NOTE — Assessment & Plan Note (Signed)
Summary: Abdominal pain / GERD / "spitting up blood"   History of Present Illness Visit Type: Initial Consult Primary GI MD: Yancey Flemings MD Primary Provider: Birdie Sons, MD Requesting Provider: Birdie Sons, MD Chief Complaint: Patient had episodes of spitting up blood after coughing 3 months ago; some problems with GERD pertaining to diet. History of Present Illness:   70 year old white male with history of hypertension, COPD, prostate cancer, chronic tobacco abuse, and adenomatous colon polyps. He is sent today regarding "spitting up blood" and a question of hematemesis. Also, problems with epigastric pain and the need for surveillance colonoscopy. The patient underwent index colonoscopy in March of 2006. He was found to have several small adenomas and mild diverticulosis. Followup in 3 years recommended. He did receive a recall letter. He has not had followup colonoscopy. The patient reports several episodes of what is clearly a hemoptysis. He describes 3-4 episodes over the past year. He attributes this to ibuprofen use, generally 600 mg b.i.d. for joint aches. He has been treated with PPI sporadically for this symptom. He does report occasional pyrosis. Next, he was seen in the emergency room for severe epigastric pain on August 10, 2009. Extensive laboratory profile was unremarkable. CT Scan of the abdomen and pelvis was negative except for liver cysts and adrenal nodules. Followup MRI for characterization revealed benign adrenal adenomas as well as benign renal and hepatic cysts. Follow up laboratories, including H. pylori antibody were negative. Chest x-ray on July 7 revealed atelectasis at the right lung base. Chest x-ray July 17 revealed mild peribronchial thickening. He has not seen a pulmonologist. He denies change in bowel habits or weight loss. No obvious melena.   GI Review of Systems    Reports acid reflux.      Denies abdominal pain, belching, bloating, chest pain, dysphagia with  liquids, dysphagia with solids, heartburn, loss of appetite, nausea, vomiting, vomiting blood, weight loss, and  weight gain.        Denies anal fissure, black tarry stools, change in bowel habit, constipation, diarrhea, diverticulosis, fecal incontinence, heme positive stool, hemorrhoids, irritable bowel syndrome, jaundice, light color stool, liver problems, rectal bleeding, and  rectal pain. Preventive Screening-Counseling & Management      Drug Use:  no.      Current Medications (verified): 1)  Aspirin 325 Mg Tabs (Aspirin) .... Once Daily 2)  Lisinopril 20 Mg Tabs (Lisinopril) .... Take 1 Tablet By Mouth Once A Day 3)  Prilosec 20 Mg Cpdr (Omeprazole) .[name].. 1 Tab By Mouth Once Daily  Allergies (verified): 1)  ! Pcn  Past History:  Past Medical History: Reviewed history from 09/03/2009 and no changes required. Colonic polyps, hx of  Tubular Adenoma Prostate cancer, hx of-8/08 Hypertension tobacco abuse history of hemoptysis COPD Hepatic Cysts  Past Surgical History: Appendectomy Prostatectomy 09/02/2006 Cataract Extraction  Family History: Reviewed history from 08/04/2007 and no changes required. Family History of Arthritis Family History Breast cancer 1st degree relative <50 Family History of Colon CA 1st degree relative <60 Family History Lung cancer Family History of Stroke M 1st degree relative <50 Family History Hypertension-mother  Social History: Occupation: PT Single Retired Current Smoker 1/2 ppd Daily Caffeine Use 1 Illicit Drug Use - no Drug Use:  no  Review of Systems       The patient complains of change in vision, cough, and coughing up blood.  The patient denies allergy/sinus, anemia, anxiety-new, arthritis/joint pain, back pain, blood in urine, breast changes/lumps, confusion, depression-new, fainting, fatigue, fever,  headaches-new, hearing problems, heart murmur, heart rhythm changes, itching, menstrual pain, muscle pains/cramps, night sweats,  nosebleeds, pregnancy symptoms, shortness of breath, skin rash, sleeping problems, sore throat, swelling of feet/legs, swollen lymph glands, thirst - excessive , urination - excessive , urination changes/pain, urine leakage, vision changes, and voice change.    Vital Signs:  Patient profile:   70 year old male Height:      70 inches Weight:      206.38 pounds BMI:     29.72 Pulse rate:   60 / minute Pulse rhythm:   regular BP sitting:   126 / 68  (left arm) Cuff size:   regular  Vitals Entered By: June McMurray CMA Duncan Dull) (September 09, 2009 8:27 AM)  Physical Exam  General:  Well developed, well nourished, no acute distress. Head:  Normocephalic and atraumatic. Eyes:  PERRLA, no icterus. Mouth:  No deformity or lesions. Tobacco stained tongue Neck:  Supple; no masses or thyromegaly. Breasts:  no masses, tenderness or gynecomastia noted. Lungs:  Clear throughout to auscultation. Heart:  Regular rate and rhythm; no murmurs, rubs,  or bruits. Abdomen:  Soft, nontender and nondistended. No masses, hepatosplenomegaly or hernias noted. Normal bowel sounds. Rectal:  deferred until colonoscopy Msk:  Symmetrical with no gross deformities. Normal posture. Pulses:  Normal pulses noted. Extremities:  No clubbing, cyanosis, edema or deformities noted. Neurologic:  Alert and  oriented x4;  grossly normal neurologically. Skin:  Intact without significant lesions or rashes. Cervical Nodes:  No significant cervical adenopathy.. No supraclavicular adenopathy Psych:  Alert and cooperative. Normal mood and affect.   Impression & Recommendations:  Problem # 1:  EPIGASTRIC PAIN (ICD-789.06) problems with severe epigastric pain. Negative workup as outlined. Possibly ulcer disease given chronic NSAID use. Possibly reflux equivalent with occasional pyrosis.  Plan: #1. Continue daily omeprazole #2. Schedule upper endoscopy. The nature of the procedure as well as the risks, benefits, and alternatives  were reviewed. He understood and agreed to proceed #3. Avoid NSAIDs  Problem # 2:  HEMOPTYSIS UNSPECIFIED (ICD-786.30) the patient describes hemoptysis. After GI workup completed, I think he needs chest CT and pulmonology evaluation. I discussed this with him.  Problem # 3:  COLONIC POLYPS, HX OF (ICD-V12.72) history of colonic adenomas. Somewhat overdue for followup.  Plan: #1. Colonoscopy. The nature of the procedure as well as the risks, benefits, and alternatives were reviewed. He understood and agreed to proceed. #2. Movi prep prescribed. The patient instructed on its use  Problem # 4:  GERD (ICD-530.81) continue PPI daily. Initiate reflux precautions including discontinuation of smoking  Other Orders: Colon/Endo (Colon/Endo)  Patient Instructions: 1)  Colon/Endo [facility] 10/08/09 2:00 pm  arrive at 1:00 pm 2)  Movi prep instructions given to patient. 3)  Movi prep Rx. sent to pharmacy. 4)  Colonoscopy and Flexible Sigmoidoscopy brochure given.  5)  Upper Endoscopy brochure given.  6)  Copy sent to : Birdie Sons, MD 7)  The medication list was reviewed and reconciled.  All changed / newly prescribed medications were explained.  A complete medication list was provided to the patient / caregiver. Prescriptions: MOVIPREP 100 GM  SOLR (PEG-KCL-NACL-NASULF-NA ASC-C) As per prep instructions.  #1 x 0   Entered by:   Milford Cage NCMA   Authorized by:   Hilarie Fredrickson MD   Signed by:   Milford Cage NCMA on 09/09/2009   Method used:   [facility]        [facility] [address] [id number] (retail[facility]       [address]       [place], [place]  [address]       Ph: [phone] or [phone]       Fax: [phone]   RxID:   [id number]

## 2010-02-24 NOTE — Assessment & Plan Note (Signed)
Summary: pt will come in fasting/njr   Vital Signs:  Patient profile:   70 year old male Height:      70 inches (177.80 cm) Weight:      205.50 pounds (93.41 kg) BMI:     29.59 Temp:     98.0 degrees F (36.67 degrees C) oral BP sitting:   120 / 60  (left arm) Cuff size:   large  Vitals Entered By: Lucious Groves CMA (October 02, 2009 7:59 AM) CC: Yearly--Fasting./kb Is Patient Diabetic? No Pain Assessment Patient in pain? no      Comments Patient states that no refills are needed at this time. He is scheduled for colonoscopy next week/kb   Primary Care Provider:  Birdie Sons, MD  CC:  Yearly--Fasting./kb.  History of Present Illness: Here for Medicare AWV:  1.   Risk factors based on Past M, S, F history: see list 2.   Physical Activities:  3.   Depression/mood:  4.   Hearing:  5.   ADL's:  6.   Fall Risk:  7.   Home Safety:  8.   Height, weight, &visual acuity: 9.   Counseling:  10.   Labs ordered based on risk factors:  11.           Referral Coordination 12.           Care Plan 13.            Cognitive Assessment   Current Problems:  GERD (ICD-530.81) HYPERGLYCEMIA (ICD-790.29) HEMOPTYSIS UNSPECIFIED (ICD-786.30) COPD (ICD-496) HYPERTENSION (ICD-401.9) PREVENTIVE HEALTH CARE (ICD-V70.0) PROSTATE CANCER, HX OF (ICD-V10.46) TOBACCO USE (ICD-305.1) COLONIC POLYPS, HX OF (ICD-V12.72)    Current Problems (verified): 1)  Gerd  (ICD-530.81) 2)  Hyperglycemia  (ICD-790.29) 3)  Adrenal Mass, Bilateral  (ICD-255.8) 4)  Hemoptysis Unspecified  (ICD-786.30) 5)  COPD  (ICD-496) 6)  Hypertension  (ICD-401.9) 7)  Preventive Health Care  (ICD-V70.0) 8)  Prostate Cancer, Hx of  (ICD-V10.46) 9)  Tobacco Use  (ICD-305.1) 10)  Colonic Polyps, Hx of  (ICD-V12.72)  Current Medications (verified): 1)  Aspirin 325 Mg Tabs (Aspirin) .... Once Daily 2)  Lisinopril 20 Mg Tabs (Lisinopril) .... Take 1 Tablet By Mouth Once A Day 3)  Prilosec 20 Mg Cpdr (Omeprazole)  .[name].. 1 Tab By Mouth Once Daily 4)  Moviprep 100 Gm  Solr (Peg-Kcl-Nacl-Nasulf-Na Asc-C) .... As Per Prep Instructions.  Allergies (verified): 1)  ! Pcn  Past History:  Past Medical History: Last updated: 09/03/2009 Colonic polyps, hx of  Tubular Adenoma Prostate cancer, hx of-8/08 Hypertension tobacco abuse history of hemoptysis COPD Hepatic Cysts  Past Surgical History: Last updated: 09/09/2009 Appendectomy Prostatectomy 09/02/2006 Cataract Extraction  Family History: Last updated: 08/04/2007 Family History of Arthritis Family History Breast cancer 1st degree relative <50 Family History of Colon CA 1st degree relative <60 Family History Lung cancer Family History of Stroke M 1st degree relative <50 Family History Hypertension-mother  Social History: Last updated: 09/09/2009 Occupation: PT Single Retired Current Smoker 1/2 ppd Daily Caffeine Use 1 Illicit Drug Use - no  Risk Factors: Smoking Status: current (08/15/2009) Packs/Day: 0.25 (08/15/2009)  Physical Exam  General:  alert and well-developed.   Head:  normocephalic and atraumatic.   Eyes:  pupils equal and pupils round.   Ears:  R ear normal and L ear normal.   Neck:  No deformities, masses, or tenderness noted. Chest Wall:  No deformities, masses, tenderness or gynecomastia noted. Lungs:  normal respiratory effort and no  intercostal retractions.   Heart:  normal rate and regular rhythm.   Abdomen:  soft and non-tender.   Msk:  No deformity or scoliosis noted of thoracic or lumbar spine.   Neurologic:  cranial nerves II-XII intact and gait normal.   Skin:  turgor normal and color normal.   Psych:  good eye contact and not anxious appearing.     Impression & Recommendations:  Problem # 1:  PREVENTIVE HEALTH CARE (ICD-V70.0)  Orders: Medicare -1st Annual Wellness Visit [phone])  Problem # 2:  HEMOPTYSIS UNSPECIFIED (ICD-786.30)  Problem # 3:  GERD (ICD-530.81)  His updated medication list  for this problem includes:    Prilosec 20 Mg Cpdr (Omeprazole) .[name]... 1 tab by mouth once daily  Problem # 4:  COPD (ICD-496)  Problem # 5:  HYPERTENSION (ICD-401.9)  His updated medication list for this problem includes:    Lisinopril 20 Mg Tabs (Lisinopril) .[name]... Take 1 tablet by mouth once a day  BP today: 120/60 Prior BP: 126/68 (09/09/2009)  Labs Reviewed: K+: 4.2 (08/12/2009) Creat: : 1.0 (08/12/2009)   Chol: 143 (05/02/2007)   HDL: 24.6 (05/02/2007)   LDL: 103 (05/02/2007)   TG: 75 (05/02/2007)  Orders: TLB-Lipid Panel (80061-LIPID)  Complete Medication List: 1)  Aspirin 325 Mg Tabs (Aspirin) .... Once daily 2)  Lisinopril 20 Mg Tabs (Lisinopril) .... Take 1 tablet by mouth once a day 3)  Prilosec 20 Mg Cpdr (Omeprazole) .[name].. 1 tab by mouth once daily 4)  Moviprep 100 Gm Solr (Peg-kcl-nacl-nasulf-na asc-c) .... As per prep instructions.  Other Orders: Venipuncture (60454) TLB-CBC Platelet - w/Differential (85025-CBCD) TLB-A1C / Hgb A1C (Glycohemoglobin) (83036-A1C) TLB-BMP (Basic Metabolic Panel-BMET) (80048-METABOL) TLB-Hepatic/Liver Function Pnl (80076-HEPATIC)  Contraindications/Deferment of Procedures/Staging:    Test/Procedure: FLU VAX    Reason for deferment: patient declined     Test/Procedure: Zoster vaccine    Reason for deferment: declined   Patient Instructions: 1)  Please schedule a follow-up appointment in 6 months.  Appended Document: Orders Update    Clinical Lists Changes  Orders: Added new Service order of Specimen Handling (09811) - Signed

## 2010-02-24 NOTE — Progress Notes (Signed)
Summary: muscle relaxer  Phone Note Call from Patient   Caller: Patient Call For: Birdie Sons MD Summary of Call: [facility] [phone] Tightness in back with increased activiity. Would like a muscle relaxer.  Initial call taken by: Lynann Beaver CMA,  May 06, 2009 9:18 AM  Follow-up for Phone Call        flexeril 10 mg by mouth three times a day as needed #30/0 Follow-up by: Birdie Sons MD,  May 06, 2009 4:49 PM    New/Updated Medications: FLEXERIL 10 MG TABS (CYCLOBENZAPRINE HCL) one by mouth three times a day Prescriptions: FLEXERIL 10 MG TABS (CYCLOBENZAPRINE HCL) one by mouth three times a day  #30 x 0   Entered by:   Lynann Beaver CMA   Authorized by:   Birdie Sons MD   Signed by:   Lynann Beaver CMA on 05/06/2009   Method used:   [facility] to        [facility] [address] [phone]* (retail)       [address]       [place], [place]  [address]       Ph: [phone] or [phone]       Fax: [phone]   RxID:   [id number]  Pt notified.

## 2010-02-24 NOTE — Letter (Signed)
Summary: [facility] Instructions  [facility]  [address] [place], [place] [address]   Phone: [phone]  Fax: [phone]       Joe Hill    [birth year]    MRN: [id number]        Procedure Day /Date:WEDNESDAY, 10/08/09     Arrival Time:1:00 PM     Procedure Time:2:00 PM     Location of Procedure:                    X  [facility] (4th Floor)                        PREPARATION FOR COLONOSCOPY WITH MOVIPREP/ENDO   Starting 5 days prior to your procedure 10/03/09 do not eat nuts, seeds, popcorn, corn, beans, peas,  salads, or any raw vegetables.  Do not take any fiber supplements (e.g. Metamucil, Citrucel, and Benefiber).  THE DAY BEFORE YOUR PROCEDURE         DATE: 10/07/09  DAY: TUESDAY  1.  Drink clear liquids the entire day-NO SOLID FOOD  2.  Do not drink anything colored red or purple.  Avoid juices with pulp.  No orange juice.  3.  Drink at least 64 oz. (8 glasses) of fluid/clear liquids during the day to prevent dehydration and help the prep work efficiently.  CLEAR LIQUIDS INCLUDE: Water Jello Ice Popsicles Tea (sugar ok, no milk/cream) Powdered fruit flavored drinks Coffee (sugar ok, no milk/cream) Gatorade Juice: apple, white grape, white cranberry  Lemonade Clear bullion, consomm, broth Carbonated beverages (any kind) Strained chicken noodle soup Hard Candy                             4.  In the morning, mix first dose of MoviPrep solution:    Empty 1 Pouch A and 1 Pouch B into the disposable container    Add lukewarm drinking water to the top line of the container. Mix to dissolve    Refrigerate (mixed solution should be used within 24 hrs)  5.  Begin drinking the prep at 5:00 p.m. The MoviPrep container is divided by 4 marks.   Every 15 minutes drink the solution down to the next mark (approximately 8 oz) until the full liter is complete.   6.  Follow completed prep with 16 oz of clear liquid of your choice (Nothing  red or purple).  Continue to drink clear liquids until bedtime.  7.  Before going to bed, mix second dose of MoviPrep solution:    Empty 1 Pouch A and 1 Pouch B into the disposable container    Add lukewarm drinking water to the top line of the container. Mix to dissolve    Refrigerate  THE DAY OF YOUR PROCEDURE      DATE: 9/14/11DAY: WEDNESDAY  Beginning at 9:00 a.m. (5 hours before procedure):         1. Every 15 minutes, drink the solution down to the next mark (approx 8 oz) until the full liter is complete.  2. Follow completed prep with 16 oz. of clear liquid of your choice.    3. You may drink clear liquids until 12:00 NOON (2 HOURS BEFORE PROCEDURE).   MEDICATION INSTRUCTIONS  Unless otherwise instructed, you should take regular prescription medications with a small sip of water   as early as possible the morning of your procedure.  OTHER INSTRUCTIONS  You will need a responsible adult at least 70 years of age to accompany you and drive you home.   This person must remain in the waiting room during your procedure.  Wear loose fitting clothing that is easily removed.  Leave jewelry and other valuables at home.  However, you may wish to bring a book to read or  an iPod/MP3 player to listen to music as you wait for your procedure to start.  Remove all body piercing jewelry and leave at home.  Total time from sign-in until discharge is approximately 2-3 hours.  You should go home directly after your procedure and rest.  You can resume normal activities the  day after your procedure.  The day of your procedure you should not:   Drive   Make legal decisions   Operate machinery   Drink alcohol   Return to work  You will receive specific instructions about eating, activities and medications before you leave.    The above instructions have been reviewed and explained to me by   _______________________    I fully understand and can verbalize  these instructions _____________________________ Date _________

## 2010-02-24 NOTE — Progress Notes (Signed)
Summary: slight hemoptysis  Phone Note Call from Patient Call back at Work Phone [phone]   Caller: Patient VM Call For: Birdie Sons MD Summary of Call: Was seen for flu sxs week ago Fri.  Now small amount blood when coughs, has done this previously in Nov 2010.  Asking if should continue mucinex DM. Initial call taken by: Gladis Riffle, RN,  March 10, 2009 2:39 PM  Follow-up for Phone Call        Advised pt to continue mucinex, but monitor amount of blood coughing up.  He is to call for appt if persists or amt of bleeding increases.  Did have the problem before in Nov and subsided on its own.  He admits to coughing pretty hard to clear phlegm. Follow-up by: Gladis Riffle, RN,  March 10, 2009 2:44 PM  Additional Follow-up for Phone Call Additional follow up Details #1::        agree, send for CXR---hemoptysis Additional Follow-up by: Birdie Sons MD,  March 10, 2009 3:57 PM    Additional Follow-up for Phone Call Additional follow up Details #2::    Patient notified. order in process and he will have done Follow-up by: Gladis Riffle, RN,  March 10, 2009 4:00 PM

## 2010-02-24 NOTE — Letter (Signed)
[facility]: [facility]   Imported By: Sherian Rein 10/03/2009 13:35:29  _____________________________________________________________________  External Attachment:    Type:   Image     Comment:   External Document

## 2010-04-01 ENCOUNTER — Encounter: Payer: Self-pay | Admitting: Internal Medicine

## 2010-04-02 ENCOUNTER — Ambulatory Visit: Payer: Self-pay | Admitting: Internal Medicine

## 2010-04-11 LAB — POCT CARDIAC MARKERS
CKMB, poc: <1 ng/mL — ABNORMAL LOW (ref 1.0–8.0)
Myoglobin, poc: 75.8 ng/mL (ref 12–200)

## 2010-04-11 LAB — URINALYSIS, ROUTINE W REFLEX MICROSCOPIC
Hgb urine dipstick: NEGATIVE
Specific Gravity, Urine: 1.018 (ref 1.005–1.030)
Urobilinogen, UA: 1 mg/dL (ref 0.0–1.0)
pH: 7.5 (ref 5.0–8.0)

## 2010-04-11 LAB — COMPREHENSIVE METABOLIC PANEL
AST: 20 U/L (ref 0–37)
Albumin: 4.2 g/dL (ref 3.5–5.2)
BUN: 13 mg/dL (ref 6–23)
Calcium: 9.2 mg/dL (ref 8.4–10.5)
Creatinine, Ser: 0.95 mg/dL (ref 0.4–1.5)
GFR calc Af Amer: >60 mL/min (ref >60)
Total Protein: 7.3 g/dL (ref 6.0–8.3)

## 2010-04-11 LAB — DIFFERENTIAL
Basophils Absolute: 0 10*3/uL (ref 0.0–0.1)
Eosinophils Relative: 1 % (ref 0–5)
Lymphocytes Relative: 8 % — ABNORMAL LOW (ref 12–46)
Lymphs Abs: 0.7 10*3/uL (ref 0.7–4.0)
Monocytes Absolute: 0.5 10*3/uL (ref 0.1–1.0)
Monocytes Relative: 5 % (ref 3–12)
Neutro Abs: 7.6 10*3/uL (ref 1.7–7.7)

## 2010-04-11 LAB — CBC
MCH: 33.5 pg (ref 26.0–34.0)
MCV: 95.3 fL (ref 78.0–100.0)
Platelets: 252 10*3/uL (ref 150–400)
RDW: 14.2 % (ref 11.5–15.5)

## 2010-05-06 ENCOUNTER — Ambulatory Visit (INDEPENDENT_AMBULATORY_CARE_PROVIDER_SITE_OTHER): Payer: Medicare Other | Admitting: Internal Medicine

## 2010-05-06 ENCOUNTER — Encounter: Payer: Self-pay | Admitting: Internal Medicine

## 2010-05-06 DIAGNOSIS — J449 Chronic obstructive pulmonary disease, unspecified: Secondary | ICD-10-CM

## 2010-05-06 DIAGNOSIS — I10 Essential (primary) hypertension: Secondary | ICD-10-CM

## 2010-05-06 DIAGNOSIS — M25559 Pain in unspecified hip: Secondary | ICD-10-CM

## 2010-05-06 MED ORDER — AMLODIPINE BESYLATE 5 MG PO TABS: 5.0000 mg | ORAL_TABLET | Freq: Every day | ORAL | Status: DC

## 2010-05-06 NOTE — Assessment & Plan Note (Signed)
Amlodipine 5 mg Side effects discussed  I'm not sure that his abdominal complaints related to lisinopril---i will not list as allergy

## 2010-05-06 NOTE — Assessment & Plan Note (Signed)
Reviewed ct He understands need to quit smoking---not interested

## 2010-05-06 NOTE — Progress Notes (Signed)
  Subjective:    Patient ID: Joe Hill, male    DOB: 1940/11/11, 70 y.o.   MRN: [id number]  HPI  htn Copd Tobacco abuse  [place] Peers sxs last week---resolved  Hip pain and leg pain (right) when he first stands---no trouble with walking  Past Medical History  Diagnosis Date  . 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 COLONIC POLYPS, HX OF 10/11/2006  . COPD 03/31/2009  . EMPHYSEMA 10/16/2009  . GERD 09/09/2009  . HEMOPTYSIS UNSPECIFIED 07/30/2009  . HYPERGLYCEMIA 08/12/2009  . HYPERTENSION 08/04/2007  . 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 TOBACCO USE 01/30/2007   Past Surgical History  Procedure Date  . Appendectomy   . Prostate surgery     prostatectomy  . Cataract extraction     reports that he has been smoking Cigarettes.  He has been smoking about .5 packs per day. He does not have any smokeless tobacco history on file. He reports that he does not drink alcohol or use illicit drugs. family history includes Arthritis in his maternal aunt; Cancer in his father and sister; and Hyperlipidemia in his mother. Allergies  Allergen Reactions  . Penicillins      Review of Systems  patient denies chest pain, shortness of breath, orthopnea. Denies lower extremity edema, abdominal pain, change in appetite, change in bowel movements. Patient denies rashes, musculoskeletal complaints. No other specific complaints in a complete review of systems.      Objective:   Physical Exam  well-developed well-nourished male in no acute distress. HEENT exam atraumatic, normocephalic, neck supple without jugular venous distention. Chest clear to auscultation cardiac exam S1-S2 are regular. Abdominal exam overweight with bowel sounds, soft and nontender. Extremities no edema. Neurologic exam is alert with a normal gait.        Assessment & Plan:

## 2010-05-07 ENCOUNTER — Ambulatory Visit (INDEPENDENT_AMBULATORY_CARE_PROVIDER_SITE_OTHER)
Admission: RE | Admit: 2010-05-07 | Discharge: 2010-05-07 | Disposition: A | Discharge status: Home / Self Care | Payer: Medicare Other | Source: Ambulatory Visit | Attending: Internal Medicine | Admitting: Internal Medicine

## 2010-05-07 DIAGNOSIS — M25559 Pain in unspecified hip: Secondary | ICD-10-CM

## 2010-05-08 ENCOUNTER — Other Ambulatory Visit: Payer: Self-pay | Admitting: Internal Medicine

## 2010-05-08 DIAGNOSIS — M25551 Pain in right hip: Secondary | ICD-10-CM

## 2010-06-09 NOTE — Op Note (Signed)
NAMEINFANT, ZINK NO.:  [id number]   MEDICAL RECORD NO.:  [id number]          PATIENT TYPE:  INP   LOCATION:  X002                         FACILITY:  [facility]   PHYSICIAN:  Excell Seltzer. Annabell Howells, M.D.    DATE OF BIRTH:  [birth year]   DATE OF PROCEDURE:  09/15/2006  DATE OF DISCHARGE:                               OPERATIVE REPORT   PROCEDURE:  Robot assisted laparoscopic radical prostatectomy with  bilateral pelvic lymph node dissection.   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   SURGEON:  Dr. Bjorn Pippin.   ASSISTANT:  Crecencio Mc, M.D.   ANESTHESIA:  General.   DRAIN:  10-French round Blake wound drain and 20-French coude' Foley  catheter.   BLOOD LOSS:  200 mL.   COMPLICATIONS:  None.   INDICATIONS:  Mr. Kniskern is a 70 year old white male who was sent for an  elevated PSA and prostate nodularity, who was found to have a T2a  Gleason 7 for 3+4 adenocarcinoma of prostate.  He has elected  prostatectomy for treatment.   FINDINGS AND PROCEDURE:  The patient was taken to the operating room  after receiving a gram of Ancef and being fitted with PAS hose.  A  general anesthetic was induced.  He was placed in the lithotomy  position.  His abdomen was shaved and a red rubber rectal catheter with  an Asepto syringe was placed rectally.  The patient's abdomen was  prepped with Betadine solution.  He was draped in the usual sterile  fashion.  Prior to draping the patient was placed in steep Trendelenburg  as per routine.   At this point the pubis was marked and the camera port incision was  located 18 cm superior to the pubis just to the left of the umbilicus.  A 2.5 cm incision was made with the knife.  The subcutaneous tissue was  spread with a hemostat and the anterior rectus sheath was exposed.  This  was incised with the Bovie.  The rectus  muscle was separated exposing  the posterior sheath which was nicked with a knife.  A hemostat was used  to then enter the peritoneal cavity followed by a finger to ensure good  positioning.  The #12 camera port was then placed and the skin around  the camera port was secured to prevent leakage with 0 Vicryl suture.  The abdomen was then insufflated initially on low-flow once progress was  made on high-flow.  The remainder of the ports were then placed in with  a total of three robot ports, one 5 mm working port and one 12 mm  working port in the standard distribution.  The sites were marked,  infiltrated with lidocaine.  Skin was incised with a knife and then  trocars were then placed under visual guidance.   The robot was then docked.  A PK dissector was placed in the third arm  port. A progress was placed in the fourth arm port, cautery  scissors  were placed in the right #2 port.   We then initiated the procedure by filling the bladder to allow  identification then incising the peritoneum over the anterior abdominal  wall dividing the obliterated umbilical arteries.  The bladder was then  dropped down off the anterior abdominal wall exposing the anterior  prostate and anterior prostate was defatted.  The endopelvic fascia was  incised initially on the right and the prostate was dissected off the  lateral pelvic sidewall.  This was then repeated on the left.  The  puboprostatic ligaments were divided and the dorsal vein complex was  exposed distally an Endo-GIA was then used to divide the dorsal vein  complex.  We then turned our attention to the bladder neck which was  located by traction on the Foley balloon.  The bladder neck was incised  using the cautery scissors exposing the Foley which was brought into the  wound and used to provide anterior traction.  The posterior bladder neck  was then divided allowing exposure of the ampulla of the vas.  The left  vas was grasped, dissected  out, divided and the fourth arm was then used  to provide anterior traction and the Foley catheter was removed.  The  left vas was then dissected out and divided.  The right seminal vesicle  was dissected out using cautery dissection.  Followed by the right  seminal vesicle.  Once all the structures had been elevated,  Denonvilliers fascia was incised exposing the plane between the prostate  and the rectum which was then developed using the progress in the fourth  arm.  Once the posterior plane had been developed, we turned our  attention to the nerve-sparing procedure.  Initially the right  periprostatic fascia was incised allowing development of plane between  the prostate and the neurovascular bundle which was dropped away from  the prostate.  This was then repeated on the left side.  Once both of  the nerve-sparing portions had been performed, the pedicles were taken  down.  Initially the left pedicle was taken down using large Hem-o-lok  clips.  Once the pedicle had been divided, additional attachments out to  the apex were taken down sharply.  We then took down the right pedicle  in identical fashion.  At this point the remaining dorsal vein complex  was incised with the cautery scissors.  The urethra was opened  anteriorly with sharp dissection and the Foley was then replaced was  removed.  The posterior urethra was divided along with some residual  rectourethralis attachments.  The prostate was then moved the operative  field and the pelvic floor was inspected for bleeding.  No active  bleeding was noted.  The pelvic floor was then irrigated.  The rectum  was then insufflated.  No evidence of rectal injury was identified.   At this point we turned our attention to the node dissection.  The left  pelvic nodes were dissected out initially with the limits dissection  being the iliac vein the obturator nerve, the circumflex iliac vein and  the bifurcation of the iliac artery.  Clips  were used controlled the  lymphatic pedicles and the packet was removed and sent for permanent  section.  The dissection was then repeated on the right with similar  limits of dissection and clip control of the pedicles.   Once the lymph node dissection had been performed, we turned our  attention to the anastomosis.  A 3-0 Vicryl  suture was used to  reapproximate the cut edge of Denonvilliers fascia with the  rectourethralis muscle.  A 2-0 Vicryl was then used to reapproximate the  posterior bladder neck with the posterior urethra.  A 4-0 PDS was then  used to complete a running urethrovesical anastomosis without  complication.  The sutures were snugged and tied and a fresh 20-French  coude' Foley catheter was inserted.  The balloon was filled with 15 mL  of sterile fluid and the catheter was irrigated.  No evidence of  anastomotic leak was identified.   At this point a round #10 Blake drain was placed through the fourth arm  port and the port was removed.  The drain was secured to the skin with a  nylon suture that had been positioned prior to being secured.   At this point the prostate was placed in an endocatch pouch and the 12  mm right working port was then removed and the port site was closed  using a 2-0 Vicryl on suture passer.  Once this port had been closed,  the remaining ports were removed under visual inspection.  Finally the  camera port was removed and the incisional suture was divided.  We then  opened the anterior rectus fascia sufficient to remove the specimen.  The anterior rectus fascia was then closed with 0 Vicryl suture.  The  port sites were infiltrated with Marcaine and closed with skin clips.  Dressings  were applied.  The catheter was irrigated a final time with clear return  and was placed to straight drainage.  The patient was taken down from  lithotomy position.  His anesthetic was reversed.  He was removed to the  recovery room in stable condition and  there were no complications.      Excell Seltzer. Annabell Howells, M.D.  Electronically Signed     JJW/MEDQ  D:  09/15/2006  T:  09/16/2006  Job:  [id number]   cc:   Valetta Mole. Swords, MD  [address] [place]  [place] [address]

## 2010-08-05 ENCOUNTER — Ambulatory Visit: Payer: Medicare Other | Admitting: Internal Medicine

## 2010-08-25 ENCOUNTER — Encounter: Payer: Self-pay | Admitting: Internal Medicine

## 2010-08-25 ENCOUNTER — Ambulatory Visit (INDEPENDENT_AMBULATORY_CARE_PROVIDER_SITE_OTHER): Payer: Medicare Other | Admitting: Internal Medicine

## 2010-08-25 DIAGNOSIS — I10 Essential (primary) hypertension: Secondary | ICD-10-CM

## 2010-08-25 DIAGNOSIS — M79604 Pain in right leg: Secondary | ICD-10-CM | POA: Insufficient documentation

## 2010-08-25 DIAGNOSIS — M79609 Pain in unspecified limb: Secondary | ICD-10-CM

## 2010-08-25 MED ORDER — MELOXICAM 7.5 MG PO TABS: 7.5000 mg | ORAL_TABLET | Freq: Every day | ORAL | Status: AC

## 2010-08-25 NOTE — Progress Notes (Signed)
  Subjective:    Patient ID: Joe Hill, male    DOB: Feb 18, 1940, 70 y.o.   MRN: [id number]  HPI Right leg pain Ongoing for 4 weeks can be severe.  Pain extends from thigh to calf Pain is positional---can sleep well  Past Medical History  Diagnosis Date  . 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 COLONIC POLYPS, HX OF 10/11/2006  . COPD 03/31/2009  . EMPHYSEMA 10/16/2009  . GERD 09/09/2009  . HEMOPTYSIS UNSPECIFIED 07/30/2009  . HYPERGLYCEMIA 08/12/2009  . HYPERTENSION 08/04/2007  . 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 TOBACCO USE 01/30/2007   Past Surgical History  Procedure Date  . Appendectomy   . Prostate surgery     prostatectomy  . Cataract extraction     reports that he has been smoking Cigarettes.  He has been smoking about .5 packs per day. He does not have any smokeless tobacco history on file. He reports that he does not drink alcohol or use illicit drugs. family history includes Arthritis in his maternal aunt; Cancer in his father and sister; and Hyperlipidemia in his mother. Allergies  Allergen Reactions  . Penicillins      Review of Systems  patient denies chest pain, shortness of breath, orthopnea. Denies lower extremity edema, abdominal pain, change in appetite, change in bowel movements. Patient denies rashes, musculoskeletal complaints. No other specific complaints in a complete review of systems.      Objective:   Physical Exam  well-developed well-nourished male in no acute distress. HEENT exam atraumatic, normocephalic, neck supple without jugular venous distention. Chest clear to auscultation cardiac exam S1-S2 are regular. Abdominal exam overweight with bowel sounds, soft and nontender. Extremities no edema. Neurologic exam is alert with a normal gait.        Assessment & Plan:

## 2010-08-25 NOTE — Assessment & Plan Note (Signed)
Controlled No need for further evaluation Stay on amlodipine

## 2010-08-25 NOTE — Assessment & Plan Note (Signed)
Pain is new  Exacerbated by sitting i think this is more likely neurologic (radiculopathy)

## 2010-08-26 LAB — D-DIMER, QUANTITATIVE: D-Dimer, Quant: 0.38 ug/mL-FEU (ref 0.00–0.48)

## 2010-09-07 ENCOUNTER — Other Ambulatory Visit: Payer: Self-pay | Admitting: Pulmonary Disease

## 2010-09-07 DIAGNOSIS — R9389 Abnormal findings on diagnostic imaging of other specified body structures: Secondary | ICD-10-CM | POA: Insufficient documentation

## 2010-09-09 ENCOUNTER — Ambulatory Visit (INDEPENDENT_AMBULATORY_CARE_PROVIDER_SITE_OTHER)
Admission: RE | Admit: 2010-09-09 | Discharge: 2010-09-09 | Disposition: A | Discharge status: Home / Self Care | Payer: Medicare Other | Source: Ambulatory Visit | Attending: Pulmonary Disease | Admitting: Pulmonary Disease

## 2010-09-09 DIAGNOSIS — R9389 Abnormal findings on diagnostic imaging of other specified body structures: Secondary | ICD-10-CM

## 2010-11-06 LAB — TYPE AND SCREEN: ABO/RH(D): O POS

## 2010-11-06 LAB — BASIC METABOLIC PANEL
CO2: 29
Calcium: 9.2
Chloride: 106
Creatinine, Ser: 0.99
GFR calc Af Amer: >60
Glucose, Bld: 121 — ABNORMAL HIGH
Sodium: 144

## 2010-11-06 LAB — ABO/RH: ABO/RH(D): O POS

## 2010-11-06 LAB — HEMOGLOBIN AND HEMATOCRIT, BLOOD: HCT: 44

## 2011-05-25 ENCOUNTER — Other Ambulatory Visit: Payer: Self-pay | Admitting: *Deleted

## 2011-05-25 DIAGNOSIS — I10 Essential (primary) hypertension: Secondary | ICD-10-CM

## 2011-05-25 MED ORDER — AMLODIPINE BESYLATE 5 MG PO TABS: 5.0000 mg | ORAL_TABLET | Freq: Every day | ORAL | Status: DC

## 2011-06-25 ENCOUNTER — Other Ambulatory Visit: Payer: Self-pay | Admitting: Internal Medicine

## 2011-09-06 DIAGNOSIS — Z8546 Personal history of malignant neoplasm of prostate: Secondary | ICD-10-CM | POA: Diagnosis not present

## 2011-11-02 DIAGNOSIS — N529 Male erectile dysfunction, unspecified: Secondary | ICD-10-CM | POA: Diagnosis not present

## 2011-11-02 DIAGNOSIS — Z8546 Personal history of malignant neoplasm of prostate: Secondary | ICD-10-CM | POA: Diagnosis not present

## 2011-11-02 DIAGNOSIS — N39498 Other specified urinary incontinence: Secondary | ICD-10-CM | POA: Diagnosis not present

## 2012-02-14 ENCOUNTER — Telehealth: Payer: Self-pay | Admitting: Internal Medicine

## 2012-02-14 MED ORDER — HYDROCODONE-ACETAMINOPHEN 5-500 MG PO TABS: 1.0000 | ORAL_TABLET | Freq: Every day | ORAL | Status: DC | PRN

## 2012-02-14 NOTE — Telephone Encounter (Signed)
Pt's HYDROcodone-acetaminophen (VICODIN) 5-500 MG per tablet ran out in July 2013 & he never got in here to get refills. He states now he is feeling some symptoms and he feels it is because of this. Dr. Cato Mulligan' first appt is later in Feb. Please advise re refills vs appt, or refills and OV and call pt. Thank you.

## 2012-02-14 NOTE — Telephone Encounter (Signed)
Can give #20/0 refills

## 2012-02-14 NOTE — Telephone Encounter (Signed)
rx called in

## 2012-02-14 NOTE — Telephone Encounter (Signed)
Is this ok to refill since its been since July?

## 2012-02-15 MED ORDER — AMLODIPINE BESYLATE 5 MG PO TABS: 5.0000 mg | ORAL_TABLET | Freq: Every day | ORAL | Status: DC

## 2012-02-15 NOTE — Telephone Encounter (Signed)
Pt called back and said he does not know where we got that he needed a refill on his hydrocodone because he has never taken hydrocodone.  He needs a refill on amlodipine.  Pt has already p/u the hydrocodone at the pharmacy and I instructed pt to take the medicine back and I would call in the correct med

## 2012-02-15 NOTE — Addendum Note (Signed)
Addended by: Alfred Levins D on: 02/15/2012 12:06 PM   Modules accepted: Orders

## 2012-03-14 ENCOUNTER — Encounter: Payer: Self-pay | Admitting: Internal Medicine

## 2012-03-14 ENCOUNTER — Ambulatory Visit (INDEPENDENT_AMBULATORY_CARE_PROVIDER_SITE_OTHER): Payer: Medicare Other | Admitting: Internal Medicine

## 2012-03-14 VITALS — BP 132/76 | HR 68 | Temp 97.9°F | Wt 212.0 lb

## 2012-03-14 DIAGNOSIS — I10 Essential (primary) hypertension: Secondary | ICD-10-CM | POA: Diagnosis not present

## 2012-03-14 DIAGNOSIS — R9389 Abnormal findings on diagnostic imaging of other specified body structures: Secondary | ICD-10-CM | POA: Diagnosis not present

## 2012-03-14 DIAGNOSIS — J449 Chronic obstructive pulmonary disease, unspecified: Secondary | ICD-10-CM | POA: Diagnosis not present

## 2012-03-14 LAB — BASIC METABOLIC PANEL
Calcium: 8.9 mg/dL (ref 8.4–10.5)
Creatinine, Ser: 0.9 mg/dL (ref 0.4–1.5)
Sodium: 141 mEq/L (ref 135–145)

## 2012-03-14 LAB — LIPID PANEL
Cholesterol: 126 mg/dL (ref 0–200)
HDL: 33.3 mg/dL — ABNORMAL LOW (ref >39.00)
Triglycerides: 74 mg/dL (ref 0.0–149.0)

## 2012-03-14 NOTE — Progress Notes (Signed)
Patient ID: Joe Hill, male   DOB: 06-07-1940, 72 y.o.   MRN: [id number] htn-- has run out of bp meds  1 month ago had an episode of sob at rest-- lasted 5 minutes and resolved.   Prostate ca-- sees urology yearly  Past Medical History  Diagnosis Date  . 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 COLONIC POLYPS, HX OF 10/11/2006  . COPD 03/31/2009  . EMPHYSEMA 10/16/2009  . GERD 09/09/2009  . HEMOPTYSIS UNSPECIFIED 07/30/2009  . HYPERGLYCEMIA 08/12/2009  . HYPERTENSION 08/04/2007  . 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 TOBACCO USE 01/30/2007    History   Social History  . Marital Status: Single    Spouse Name: N/A    Number of Children: N/A  . Years of Education: N/A   Occupational History  . Not on file.   Social History Main Topics  . Smoking status: Current Every Day Smoker -- 0.50 packs/day    Types: Cigarettes  . Smokeless tobacco: Not on file  . Alcohol Use: No  . Drug Use: No  . Sexually Active: Not on file   Other Topics Concern  . Not on file   Social History Narrative  . No narrative on file    Past Surgical History  Procedure Laterality Date  . Appendectomy    . Prostate surgery      prostatectomy  . Cataract extraction      Family History  Problem Relation Age of Onset  . Hyperlipidemia Mother   . Cancer Father     lung  . Cancer Sister     lung  . Arthritis Maternal Aunt     Allergies  Allergen Reactions  . Penicillins     Current Outpatient Prescriptions on File Prior to Visit  Medication Sig Dispense Refill  . amLODipine (NORVASC) 5 MG tablet Take 1 tablet (5 mg total) by mouth daily.  30 tablet  1   No current facility-administered medications on file prior to visit.     patient denies chest pain, shortness of breath, orthopnea. Denies lower extremity edema, abdominal pain, change in appetite, change in bowel movements. Patient denies rashes, musculoskeletal complaints. No other specific complaints in a complete review of systems.   BP  132/76  Pulse 68  Temp(Src) 97.9 F (36.6 C) (Oral)  Wt 212 lb (96.163 kg)  BMI 29.98 kg/m2  well-developed well-nourished male in no acute distress. HEENT exam atraumatic, normocephalic, neck supple without jugular venous distention. Chest clear to auscultation cardiac exam S1-S2 are regular. Abdominal exam overweight with bowel sounds, soft and nontender. Extremities no edema. Neurologic exam is alert with a normal gait.

## 2012-03-14 NOTE — Assessment & Plan Note (Signed)
No need for f/u-- reviewed chart and ct chest

## 2012-03-15 NOTE — Assessment & Plan Note (Signed)
Patient has no complaints

## 2012-04-12 ENCOUNTER — Other Ambulatory Visit: Payer: Self-pay | Admitting: Internal Medicine

## 2012-06-12 ENCOUNTER — Other Ambulatory Visit: Payer: Self-pay | Admitting: Internal Medicine

## 2012-08-14 ENCOUNTER — Other Ambulatory Visit: Payer: Self-pay | Admitting: Internal Medicine

## 2012-10-12 ENCOUNTER — Other Ambulatory Visit: Payer: Self-pay | Admitting: Internal Medicine

## 2012-10-16 ENCOUNTER — Encounter: Payer: Self-pay | Admitting: Internal Medicine

## 2012-10-25 DIAGNOSIS — Z8546 Personal history of malignant neoplasm of prostate: Secondary | ICD-10-CM | POA: Diagnosis not present

## 2012-11-01 DIAGNOSIS — N39498 Other specified urinary incontinence: Secondary | ICD-10-CM | POA: Diagnosis not present

## 2012-11-01 DIAGNOSIS — Z8546 Personal history of malignant neoplasm of prostate: Secondary | ICD-10-CM | POA: Diagnosis not present

## 2012-11-01 DIAGNOSIS — N529 Male erectile dysfunction, unspecified: Secondary | ICD-10-CM | POA: Diagnosis not present

## 2012-12-12 ENCOUNTER — Other Ambulatory Visit: Payer: Self-pay | Admitting: Internal Medicine

## 2012-12-19 DIAGNOSIS — H5200 Hypermetropia, unspecified eye: Secondary | ICD-10-CM | POA: Diagnosis not present

## 2012-12-19 DIAGNOSIS — Z961 Presence of intraocular lens: Secondary | ICD-10-CM | POA: Diagnosis not present

## 2012-12-19 DIAGNOSIS — H52229 Regular astigmatism, unspecified eye: Secondary | ICD-10-CM | POA: Diagnosis not present

## 2013-02-10 ENCOUNTER — Other Ambulatory Visit: Payer: Self-pay | Admitting: Internal Medicine

## 2013-02-28 ENCOUNTER — Encounter: Payer: Self-pay | Admitting: Family Medicine

## 2013-02-28 ENCOUNTER — Ambulatory Visit (INDEPENDENT_AMBULATORY_CARE_PROVIDER_SITE_OTHER): Payer: Medicare Other | Admitting: Family Medicine

## 2013-02-28 ENCOUNTER — Telehealth: Payer: Self-pay | Admitting: Internal Medicine

## 2013-02-28 VITALS — BP 146/78 | HR 67 | Temp 98.3°F | Ht 70.5 in | Wt 210.0 lb

## 2013-02-28 DIAGNOSIS — R1032 Left lower quadrant pain: Secondary | ICD-10-CM | POA: Diagnosis not present

## 2013-02-28 DIAGNOSIS — R109 Unspecified abdominal pain: Secondary | ICD-10-CM | POA: Diagnosis not present

## 2013-02-28 LAB — POCT URINALYSIS DIPSTICK
Bilirubin, UA: NEGATIVE
Glucose, UA: NEGATIVE
Ketones, UA: NEGATIVE
LEUKOCYTES UA: NEGATIVE
Nitrite, UA: NEGATIVE
Spec Grav, UA: 1.01
UROBILINOGEN UA: 0.2
pH, UA: 8

## 2013-02-28 MED ORDER — METRONIDAZOLE 500 MG PO TABS: 500.0000 mg | ORAL_TABLET | Freq: Two times a day (BID) | ORAL | Status: DC

## 2013-02-28 MED ORDER — CIPROFLOXACIN HCL 500 MG PO TABS: 500.0000 mg | ORAL_TABLET | Freq: Two times a day (BID) | ORAL | Status: DC

## 2013-02-28 NOTE — Telephone Encounter (Signed)
Patient Information:  Caller Name: [place]  Phone: [phone]  Patient: Joe Hill, Joe Hill  Gender: Male  DOB: January 25, 1941  Age: 73 Years  PCP: Phoebe Sharps (Adults only)  Office Follow Up:  Does the office need to follow up with this patient?: No  Instructions For The Office: N/A  RN Note:  Patient calling today regarding lower abdominal pain.  Voiding without difficulty.  Last PM 02/25/13. Describes as constant pain.  Symptoms  Reason For Call & Symptoms: abdominal pain and discomfort  Reviewed Health History In EMR: Yes  Reviewed Medications In EMR: Yes  Reviewed Allergies In EMR: Yes  Reviewed Surgeries / Procedures: Yes  Date of Onset of Symptoms: 02/27/2013  Guideline(s) Used:  Abdominal Pain - Male  Disposition Per Guideline:   Go to ED Now (or to Office with PCP Approval)  Reason For Disposition Reached:   Constant abdominal pain lasting > 2 hours  Advice Given:  Call Back If:  You become worse.  RN Overrode Recommendation:  Follow Up With Office Later  Appointment scheduled with Dr. Sarajane Jews @ 15:00

## 2013-02-28 NOTE — Progress Notes (Signed)
Pre visit review using our clinic review tool, if applicable. No additional management support is needed unless otherwise documented below in the visit note. 

## 2013-02-28 NOTE — Telephone Encounter (Signed)
He was seen today

## 2013-02-28 NOTE — Progress Notes (Signed)
   Subjective:    Patient ID: Joe Hill, male    DOB: [birth year], 73 y.o.   MRN: [id number]  HPI Here for 2 days of left sided lower back pain, of left flank pain and LLQ pain. No fever or nausea. His last BM was 3 days ago but it is typical for him to have a BM every 2-3 days. No urinary sx. His colonoscopy on 10-08-09 showed diverticulosis.    Review of Systems  Constitutional: Negative.   Respiratory: Negative.   Gastrointestinal: Positive for abdominal pain. Negative for nausea, vomiting, diarrhea, constipation, blood in stool, abdominal distention, anal bleeding and rectal pain.  Genitourinary: Negative.        Objective:   Physical Exam  Constitutional: He appears well-developed and well-nourished. No distress.  Abdominal: Soft. Bowel sounds are normal. He exhibits no distension and no mass. There is no rebound and no guarding.  Mildly tender in the LLQ and left flank           Assessment & Plan:  Use Cipro and Flagyl. Drink plenty of water and start using Miralax daily

## 2013-02-28 NOTE — Telephone Encounter (Signed)
For your review.  Pt coming to see Dr. Sarajane Jews.

## 2013-03-02 ENCOUNTER — Telehealth: Payer: Self-pay | Admitting: Internal Medicine

## 2013-03-02 NOTE — Telephone Encounter (Signed)
Relevant patient education mailed to patient.  

## 2013-03-05 ENCOUNTER — Emergency Department (HOSPITAL_COMMUNITY): Payer: Medicare Other

## 2013-03-05 ENCOUNTER — Emergency Department (HOSPITAL_COMMUNITY)
Admission: EM | Admit: 2013-03-05 | Discharge: 2013-03-06 | Disposition: A | Discharge status: Home / Self Care | Payer: Medicare Other | Attending: Emergency Medicine | Admitting: Emergency Medicine

## 2013-03-05 ENCOUNTER — Encounter (HOSPITAL_COMMUNITY): Payer: Self-pay | Admitting: Emergency Medicine

## 2013-03-05 DIAGNOSIS — F17200 Nicotine dependence, unspecified, uncomplicated: Secondary | ICD-10-CM | POA: Insufficient documentation

## 2013-03-05 DIAGNOSIS — I10 Essential (primary) hypertension: Secondary | ICD-10-CM | POA: Diagnosis not present

## 2013-03-05 DIAGNOSIS — Z8719 Personal history of other diseases of the digestive system: Secondary | ICD-10-CM | POA: Insufficient documentation

## 2013-03-05 DIAGNOSIS — Z860100 Personal history of colon polyps, unspecified: Secondary | ICD-10-CM | POA: Insufficient documentation

## 2013-03-05 DIAGNOSIS — J438 Other emphysema: Secondary | ICD-10-CM | POA: Diagnosis not present

## 2013-03-05 DIAGNOSIS — Z79899 Other long term (current) drug therapy: Secondary | ICD-10-CM | POA: Insufficient documentation

## 2013-03-05 DIAGNOSIS — Z8546 Personal history of malignant neoplasm of prostate: Secondary | ICD-10-CM | POA: Diagnosis not present

## 2013-03-05 DIAGNOSIS — N1330 Unspecified hydronephrosis: Secondary | ICD-10-CM | POA: Diagnosis not present

## 2013-03-05 DIAGNOSIS — Z792 Long term (current) use of antibiotics: Secondary | ICD-10-CM | POA: Insufficient documentation

## 2013-03-05 DIAGNOSIS — N201 Calculus of ureter: Secondary | ICD-10-CM | POA: Diagnosis not present

## 2013-03-05 DIAGNOSIS — Z880 Allergy status to penicillin: Secondary | ICD-10-CM | POA: Diagnosis not present

## 2013-03-05 DIAGNOSIS — E876 Hypokalemia: Secondary | ICD-10-CM | POA: Diagnosis not present

## 2013-03-05 DIAGNOSIS — N200 Calculus of kidney: Secondary | ICD-10-CM | POA: Diagnosis not present

## 2013-03-05 LAB — URINALYSIS, ROUTINE W REFLEX MICROSCOPIC
Bilirubin Urine: NEGATIVE
GLUCOSE, UA: NEGATIVE mg/dL
Ketones, ur: 15 mg/dL — AB
Nitrite: NEGATIVE
PH: 7 (ref 5.0–8.0)
PROTEIN: NEGATIVE mg/dL
Specific Gravity, Urine: 1.012 (ref 1.005–1.030)
Urobilinogen, UA: 0.2 mg/dL (ref 0.0–1.0)

## 2013-03-05 LAB — CBC
HCT: 41.2 % (ref 39.0–52.0)
Hemoglobin: 15.4 g/dL (ref 13.0–17.0)
MCH: 33.1 pg (ref 26.0–34.0)
MCHC: 37.4 g/dL — AB (ref 30.0–36.0)
MCV: 88.6 fL (ref 78.0–100.0)
PLATELETS: 229 10*3/uL (ref 150–400)
RBC: 4.65 MIL/uL (ref 4.22–5.81)
RDW: 13.2 % (ref 11.5–15.5)
WBC: 10.8 10*3/uL — AB (ref 4.0–10.5)

## 2013-03-05 LAB — COMPREHENSIVE METABOLIC PANEL
ALBUMIN: 4.5 g/dL (ref 3.5–5.2)
ALK PHOS: 94 U/L (ref 39–117)
ALT: 31 U/L (ref 0–53)
AST: 45 U/L — AB (ref 0–37)
BILIRUBIN TOTAL: 0.9 mg/dL (ref 0.3–1.2)
BUN: 12 mg/dL (ref 6–23)
CHLORIDE: 96 meq/L (ref 96–112)
CO2: 27 mEq/L (ref 19–32)
CREATININE: 1.31 mg/dL (ref 0.50–1.35)
Calcium: 9.6 mg/dL (ref 8.4–10.5)
GFR calc Af Amer: 61 mL/min — ABNORMAL LOW (ref >90)
GFR calc non Af Amer: 52 mL/min — ABNORMAL LOW (ref >90)
Glucose, Bld: 109 mg/dL — ABNORMAL HIGH (ref 70–99)
POTASSIUM: 3 meq/L — AB (ref 3.7–5.3)
Sodium: 138 mEq/L (ref 137–147)
TOTAL PROTEIN: 7.5 g/dL (ref 6.0–8.3)

## 2013-03-05 LAB — URINE MICROSCOPIC-ADD ON

## 2013-03-05 NOTE — ED Notes (Signed)
Pt states that he began to have left sided flank pain that began this afternoon; pt states that he had a similar episode last week but that it had gotten better then came back this afternoon more intense; pt states that he has had his prostate removed and has urgency but states that he feels like he is going more frequently

## 2013-03-05 NOTE — ED Notes (Signed)
Initial Contact - pt a+ox4, present with c/o 7/10 L flank pain since this afternoon.  Pt reports similar episode last week, seen by PCP and given Cipro and Flagyl "they thought it was diverticulitis".  Pt reports pain improved since then, but came back today and is worsening. Pt reports possible inc frequency of urination, but otherwise denies all complaints.  Pt is well appearing.  Skin PWD.  MAEI.  Speaking full/clear sentences.  NAD.  Changed to hospital gown, awaiting EDP eval.

## 2013-03-05 NOTE — ED Provider Notes (Signed)
CSN: [id number]     Arrival date & time 03/05/13  1934 History   First MD Initiated Contact with Patient 03/05/13 2314     Chief Complaint  Patient presents with  . Flank Pain     (Consider location/radiation/quality/duration/timing/severity/associated sxs/prior Treatment) HPI Comments: 73 year old male, history of COPD, hypertension who presents with a complaint of left lower back and flank pain. He states this started last Wednesday, lasted several days but gradually eased off after he was prescribed antibiotics by his family doctor. Saturday and Sunday he had no pain but today at 2:30 in the afternoon he developed acute onset of recurrent left-sided pain in the same location. There is no radiation, no associated abdominal pain, no nausea vomiting or testicular pain and no urinary or gastrointestinal symptoms. Normal bowel movement this morning, normal urinary habits. It seems to get worse when he moves, bends, flexes or rotates at the hips, there are certain positions are more comfortable than others. He has no history of kidney stones  Patient is a 73 y.o. male presenting with flank pain. The history is provided by the patient.  Flank Pain    Past Medical History  Diagnosis Date  . 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 COLONIC POLYPS, HX OF 10/11/2006  . COPD 03/31/2009  . EMPHYSEMA 10/16/2009  . GERD 09/09/2009  . HEMOPTYSIS UNSPECIFIED 07/30/2009  . HYPERGLYCEMIA 08/12/2009  . HYPERTENSION 08/04/2007  . 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 TOBACCO USE 01/30/2007   Past Surgical History  Procedure Laterality Date  . Appendectomy    . Prostate surgery      prostatectomy  . Cataract extraction     Family History  Problem Relation Age of Onset  . Hyperlipidemia Mother   . Cancer Father     lung  . Cancer Sister     lung  . Arthritis Maternal Aunt    History  Substance Use Topics  . Smoking status: Current Every Day Smoker -- 0.50 packs/day    Types: Cigarettes  . Smokeless  tobacco: Never Used  . Alcohol Use: No    Review of Systems  Genitourinary: Positive for flank pain.  All other systems reviewed and are negative.      Allergies  Penicillins  Home Medications   Current Outpatient Rx  Name  Route  Sig  Dispense  Refill  . amLODipine (NORVASC) 5 MG tablet   Oral   Take 5 mg by mouth daily.         . ciprofloxacin (CIPRO) 500 MG tablet   Oral   Take 1 tablet (500 mg total) by mouth 2 (two) times daily.   20 tablet   0   . ibuprofen (ADVIL,MOTRIN) 200 MG tablet   Oral   Take 200 mg by mouth every 6 (six) hours as needed for pain.         . metroNIDAZOLE (FLAGYL) 500 MG tablet   Oral   Take 1 tablet (500 mg total) by mouth 2 (two) times daily.   20 tablet   0   . naproxen (NAPROSYN) 500 MG tablet   Oral   Take 1 tablet (500 mg total) by mouth 2 (two) times daily with a meal.   30 tablet   0   . ondansetron (ZOFRAN ODT) 4 MG disintegrating tablet   Oral   Take 1 tablet (4 mg total) by mouth every 8 (eight) hours as needed for nausea.   10 tablet   0   . oxyCODONE-acetaminophen (  PERCOCET) 5-325 MG per tablet   Oral   Take 1 tablet by mouth every 4 (four) hours as needed.   20 tablet   0   . potassium chloride (K-DUR) 10 MEQ tablet   Oral   Take 1 tablet (10 mEq total) by mouth daily.   10 tablet   0   . tamsulosin (FLOMAX) 0.4 MG CAPS capsule   Oral   Take 1 capsule (0.4 mg total) by mouth 2 (two) times daily.   10 capsule   0    BP 155/65  Pulse 53  Temp(Src) 98.2 F (36.8 C) (Oral)  Resp 18  SpO2 99% Physical Exam  Nursing note and vitals reviewed. Constitutional: He appears well-developed and well-nourished. No distress.  HENT:  Head: Normocephalic and atraumatic.  Mouth/Throat: Oropharynx is clear and moist. No oropharyngeal exudate.  Eyes: Conjunctivae and EOM are normal. Pupils are equal, round, and reactive to light. Right eye exhibits no discharge. Left eye exhibits no discharge. No scleral  icterus.  Neck: Normal range of motion. Neck supple. No JVD present. No thyromegaly present.  Cardiovascular: Normal rate, regular rhythm, normal heart sounds and intact distal pulses.  Exam reveals no gallop and no friction rub.   No murmur heard. Pulmonary/Chest: Effort normal and breath sounds normal. No respiratory distress. He has no wheezes. He has no rales.  Abdominal: Soft. Bowel sounds are normal. He exhibits no distension and no mass. There is no tenderness.  Musculoskeletal: Normal range of motion. He exhibits tenderness ( Focal tenderness to the left costovertebral angle with palpation and percussion). He exhibits no edema.  Lymphadenopathy:    He has no cervical adenopathy.  Neurological: He is alert. Coordination normal.  Skin: Skin is warm and dry. No rash noted. No erythema.  Psychiatric: He has a normal mood and affect. His behavior is normal.    ED Course  Procedures (including critical care time) Labs Review Labs Reviewed  CBC - Abnormal; Notable for the following:    WBC 10.8 (*)    MCHC 37.4 (*)    All other components within normal limits  COMPREHENSIVE METABOLIC PANEL - Abnormal; Notable for the following:    Potassium 3.0 (*)    Glucose, Bld 109 (*)    AST 45 (*)    GFR calc non Af Amer 52 (*)    GFR calc Af Amer 61 (*)    All other components within normal limits  URINALYSIS, ROUTINE W REFLEX MICROSCOPIC - Abnormal; Notable for the following:    APPearance CLOUDY (*)    Hgb urine dipstick TRACE (*)    Ketones, ur 15 (*)    Leukocytes, UA TRACE (*)    All other components within normal limits  URINE MICROSCOPIC-ADD ON   Imaging Review Ct Abdomen Pelvis Wo Contrast  03/06/2013   CLINICAL DATA:  Left flank pain  EXAM: CT ABDOMEN AND PELVIS WITHOUT CONTRAST  TECHNIQUE: Multidetector CT imaging of the abdomen and pelvis was performed following the standard protocol without intravenous contrast.  COMPARISON:  August 10, 2009  FINDINGS: There is left perinephric  stranding with left hydronephrosis due to obstruction by a 3 mm stone in the mid left ureter. There is a 2.4 cm cyst in the upper pole left kidney unchanged. The previously noted cyst in the lower pole right kidney with partial calcified wall is significantly smaller. There is no right hydronephrosis.  There are small low-density lesions within the liver unchanged compared to prior exam, probably liver cysts.  There is gallstone at the gallbladder neck. The spleen, pancreas are normal. There are bilateral adrenal nodules unchanged. Atherosclerosis of the abdominal aorta is identified without aneurysmal dilatation. No abdominal lymphadenopathy is identified. There is no small bowel obstruction or diverticulitis. The patient is status post prior appendectomy.  The bladder is decompressed. Pelvic phleboliths are noted. The visualized lung bases are clear. Degenerative joint changes of the spine are noted.  IMPRESSION: Left hydroureteronephrosis due to obstruction by 3 mm stone in the mid left ureter.   Electronically Signed   By: Abelardo Diesel M.D.   On: 03/06/2013 00:08    EKG Interpretation   None       MDM   Final diagnoses:  Kidney stone on left side  Hypokalemia    Patient has absolutely no abdominal tenderness, his vital signs are also absolutely normal, he states that he is always slightly bradycardic of note the patient is not on a beta blocker. At this time the patient will go over for a CT scan to evaluate for kidney stone though based on his urinalysis and his history I suspect that this will be musculoskeletal rather than urologic in origin. Doubt diverticulitis given no abdominal or gastrointestinal symptoms. The patient declines pain medication.  Labs overall unremarkable, mild hypokalemia, patient informed of kidney stone, will followup for recheck. Pain medication given here including Toradol, other medications per home, patient expresses understanding.   Meds given in  ED:  Medications  ketorolac (TORADOL) injection 60 mg (not administered)    New Prescriptions   NAPROXEN (NAPROSYN) 500 MG TABLET    Take 1 tablet (500 mg total) by mouth 2 (two) times daily with a meal.   ONDANSETRON (ZOFRAN ODT) 4 MG DISINTEGRATING TABLET    Take 1 tablet (4 mg total) by mouth every 8 (eight) hours as needed for nausea.   OXYCODONE-ACETAMINOPHEN (PERCOCET) 5-325 MG PER TABLET    Take 1 tablet by mouth every 4 (four) hours as needed.   POTASSIUM CHLORIDE (K-DUR) 10 MEQ TABLET    Take 1 tablet (10 mEq total) by mouth daily.   TAMSULOSIN (FLOMAX) 0.4 MG CAPS CAPSULE    Take 1 capsule (0.4 mg total) by mouth 2 (two) times daily.      Johnna Acosta, MD 03/06/13 [phone]

## 2013-03-06 DIAGNOSIS — N1330 Unspecified hydronephrosis: Secondary | ICD-10-CM | POA: Diagnosis not present

## 2013-03-06 DIAGNOSIS — N201 Calculus of ureter: Secondary | ICD-10-CM | POA: Diagnosis not present

## 2013-03-06 MED ORDER — POTASSIUM CHLORIDE ER 10 MEQ PO TBCR: 10.0000 meq | EXTENDED_RELEASE_TABLET | Freq: Every day | ORAL | Status: DC

## 2013-03-06 MED ORDER — NAPROXEN 500 MG PO TABS: 500.0000 mg | ORAL_TABLET | Freq: Two times a day (BID) | ORAL | Status: DC

## 2013-03-06 MED ORDER — TAMSULOSIN HCL 0.4 MG PO CAPS: 0.4000 mg | ORAL_CAPSULE | Freq: Two times a day (BID) | ORAL | Status: DC

## 2013-03-06 MED ORDER — OXYCODONE-ACETAMINOPHEN 5-325 MG PO TABS: 1.0000 | ORAL_TABLET | ORAL | Status: DC | PRN

## 2013-03-06 MED ORDER — KETOROLAC TROMETHAMINE 60 MG/2ML IM SOLN
60.0000 mg | Freq: Once | INTRAMUSCULAR | Status: AC
  Administered 2013-03-06: 60 mg via INTRAMUSCULAR
  Filled 2013-03-06: qty 2

## 2013-03-06 MED ORDER — ONDANSETRON 4 MG PO TBDP: 4.0000 mg | ORAL_TABLET | Freq: Three times a day (TID) | ORAL | Status: DC | PRN

## 2013-03-06 NOTE — Discharge Instructions (Signed)
You have a 65mm kidney stone - read instructions below.  Your potassium was slightly low - take supplement for 10 days, have level rechecked at your doctor in 1 week  Your exam and or your xrays have shown that you likely have a kidney stone.  You should follow up with the Urologist of your choosing or the Urologist listed above in the next 2-3 days if you have not passed the stone.  You should urinate in to the strainer until you pass the stone.    Flomax helps with passing the stone by opening up the Ureters (tubes), Vicodin and an antiinflammatory for pain if you are not allergic to these medicines.  Phenergan or Zofran for nausea.  Return to the ER for severe or worsening pain, vomiting or fevers or if you are unable to control your pain with the medicines provided.  Kidney Stones Kidney stones (ureteral lithiasis) are deposits that form inside your kidneys. The intense pain is caused by the stone moving through the urinary tract. When the stone moves, the ureter goes into spasm around the stone. The stone is usually passed in the urine.  CAUSES   A disorder that makes certain neck glands produce too much parathyroid hormone (primary hyperparathyroidism).   A buildup of uric acid crystals.   Narrowing (stricture) of the ureter.   A kidney obstruction present at birth (congenital obstruction).   Previous surgery on the kidney or ureters.   Numerous kidney infections.  SYMPTOMS   Feeling sick to your stomach (nauseous).   Throwing up (vomiting).   Blood in the urine (hematuria).   Pain that usually spreads (radiates) to the groin.   Frequency or urgency of urination.  DIAGNOSIS   Taking a history and physical exam.   Blood or urine tests.   Computerized X-ray scan (CT scan).   Occasionally, an examination of the inside of the urinary bladder (cystoscopy) is performed.  TREATMENT   Observation.   Increasing your fluid intake.   Surgery may be needed if you have severe  pain or persistent obstruction.  The size, location, and chemical composition are all important variables that will determine the proper choice of action for you. Talk to your caregiver to better understand your situation so that you will minimize the risk of injury to yourself and your kidney.  HOME CARE INSTRUCTIONS   Drink enough water and fluids to keep your urine clear or pale yellow.   Strain all urine through the provided strainer. Keep all particulate matter and stones for your caregiver to see. The stone causing the pain may be as small as a grain of salt. It is very important to use the strainer each and every time you pass your urine. The collection of your stone will allow your caregiver to analyze it and verify that a stone has actually passed.   Only take over-the-counter or prescription medicines for pain, discomfort, or fever as directed by your caregiver.   Make a follow-up appointment with your caregiver as directed.   Get follow-up X-rays if required. The absence of pain does not always mean that the stone has passed. It may have only stopped moving. If the urine remains completely obstructed, it can cause loss of kidney function or even complete destruction of the kidney. It is your responsibility to make sure X-rays and follow-ups are completed. Ultrasounds of the kidney can show blockages and the status of the kidney. Ultrasounds are not associated with any radiation and can be performed  easily in a matter of minutes.  SEEK IMMEDIATE MEDICAL CARE IF:   Pain cannot be controlled with the prescribed medicine.   You have a fever.   The severity or intensity of pain increases over 18 hours and is not relieved by pain medicine.   You develop a new onset of abdominal pain.   You feel faint or pass out.  MAKE SURE YOU:   Understand these instructions.   Will watch your condition.   Will get help right away if you are not doing well or get worse.  Document Released:  01/11/2005 Document Revised: 12/31/2010 Document Reviewed: 05/09/2009 [facility] Patient Information 2012 [place].  RESOURCE GUIDE  Chronic Pain Problems: Contact [facility]  [phone] Patients need to be referred by their primary care doctor.  Insufficient Money for Medicine: Contact [employer] Way:  call "211" or [place] [phone].  No Primary Care Doctor: - Call [facility]  [phone] - can help you locate a primary care doctor that  accepts your insurance, provides certain services, etc. - Physician Referral Service- [phone]  Agencies that provide inexpensive medical care: - [facility]  [facility]  [phone] - [facility]  [phone] - [facility]  [phone] - Planned Parenthood  [phone] - [facility]  [phone]  [place] Providers: - [facility]- [address], [address]  [phone], Mon-Fri 9am-7pm, Sat 9am-1pm - [place], [address] [place]  [place], [address] [place]  [place]- [address]  [place], [address], [phone]  Only accepts [place] patients after they have their name  applied to their card  Self Pay (no insurance) in [place]: - Sickle Cell Patients: Dr Kevan Ny, [facility]  [place], [facility]- [place]  [facility] [place]- [address], [facility]- see information above (Speak to [employer] if you do not have insurance)       -  [facility]- [place], [place],  [place], [place]  Dr Vista Lawman-  [address], [place], [facility]- [address], [phone]       -  [facility]- [address], [place], also [address] [phone]       -    [facility]- [address], [place], 1st & 3rd Saturday   every month, 10am-1pm  1) Find a Doctor and Pay Out of Pocket Although you won't have to find out who is covered by your insurance plan, it is a good idea to ask around and get recommendations. You will then need to call the office and see if the doctor you have chosen will accept you as a new patient and what types  of options they offer for patients who are self-pay. Some doctors offer discounts or will set up payment plans for their patients who do not have insurance, but you will need to ask so you aren't surprised when you get to your appointment.  2) Contact Your Local Health Department Not all health departments have doctors that can see patients for sick visits, but many do, so it is worth a call to see if yours does. If you don't know where your local health department is, you can check in your phone book. The CDC also has a tool to help you locate your state's health department, and many state websites also have listings of all of their local health departments.  3) Find a [place] [facility] If your illness is not likely to be very severe or complicated, you may want to try a walk in clinic. These are popping up all over the country in pharmacies, drugstores, and shopping centers. They're usually staffed by nurse practitioners or physician assistants that have been trained to treat common illnesses and complaints. They're usually fairly quick and inexpensive. However, if you have serious medical issues or chronic medical problems, these are probably not your best option  STD Testing - [place], [facility], [address], [place], phone [phone] or [phone].  Monday - Friday, call for an appointment. - [place], [facility], [address], [place], phone [phone] or [phone].  Monday - Friday, call for an appointment.  Abuse/Neglect: - [place] [phone] - [place] [phone] (After Hours)  Emergency Shelter:  [facility] [phone]  Maternity Homes: - [facility] [place] [phone] - [place] [phone]  MRSA Hotline #:   210-610-7225  [facility] Dept. [address]         [place] Phone:  [phone]                                  Phone:  [phone]                   Phone:  [phone]  [place], [place] in [place], [address],                                  [place] [phone] or [phone] (After Hours)   [place]  Substance Abuse Resources: - Alcohol and Drug Services  [phone] - [facility] [phone] - [facility] [place] [phone] - Residential & Outpatient Substance Abuse Program  [phone]  Psychological Services: - [place]  [place]  [place], [phone] [place]. [address], [place], [place]: [phone] or [phone], [place]  Dental Assistance  If unable to pay  or uninsured, contact:  [facility] or [facility]. to become qualified for the adult dental clinic.  Patients with Medicaid: [facility] [phone] [address], [place] [address] [phone]  If unable to pay, or uninsured, contact [facility] [phone]) or [place] [phone] in [place], [place] in [facility]) to become qualified for the adult dental clinic  Other [place]- [place], [place], [place], [address], [place], [place], 2nd and 4th Thursday of the month at 6:30am.  10 clients each day by appointment, can sometimes see walk-in patients if someone does not show for an appointment. [facility]- [address] [place], [place], [address], [place], [place], [place], [address], [place] Department- [place] Department- [place] Department- [phone]

## 2013-03-12 ENCOUNTER — Other Ambulatory Visit: Payer: Self-pay | Admitting: Internal Medicine

## 2013-03-13 DIAGNOSIS — N201 Calculus of ureter: Secondary | ICD-10-CM | POA: Diagnosis not present

## 2013-03-29 DIAGNOSIS — N201 Calculus of ureter: Secondary | ICD-10-CM | POA: Diagnosis not present

## 2013-04-13 ENCOUNTER — Other Ambulatory Visit: Payer: Self-pay | Admitting: Internal Medicine

## 2013-05-16 ENCOUNTER — Other Ambulatory Visit: Payer: Self-pay | Admitting: Internal Medicine

## 2013-05-20 ENCOUNTER — Other Ambulatory Visit: Payer: Self-pay | Admitting: Internal Medicine

## 2013-06-18 ENCOUNTER — Other Ambulatory Visit: Payer: Self-pay | Admitting: Internal Medicine

## 2013-07-19 ENCOUNTER — Other Ambulatory Visit: Payer: Self-pay | Admitting: Internal Medicine

## 2013-08-15 ENCOUNTER — Encounter: Payer: Self-pay | Admitting: Internal Medicine

## 2013-08-20 ENCOUNTER — Other Ambulatory Visit: Payer: Self-pay | Admitting: Internal Medicine

## 2013-08-21 ENCOUNTER — Encounter: Payer: Self-pay | Admitting: Physician Assistant

## 2013-08-21 ENCOUNTER — Ambulatory Visit (INDEPENDENT_AMBULATORY_CARE_PROVIDER_SITE_OTHER): Payer: Medicare Other | Admitting: Physician Assistant

## 2013-08-21 VITALS — BP 120/68 | HR 60 | Temp 98.4°F | Resp 18 | Wt 215.0 lb

## 2013-08-21 DIAGNOSIS — I10 Essential (primary) hypertension: Secondary | ICD-10-CM

## 2013-08-21 LAB — BASIC METABOLIC PANEL
BUN: 17 mg/dL (ref 6–23)
CHLORIDE: 105 meq/L (ref 96–112)
CO2: 31 mEq/L (ref 19–32)
Calcium: 8.9 mg/dL (ref 8.4–10.5)
Creatinine, Ser: 1.2 mg/dL (ref 0.4–1.5)
GFR: 66.16 mL/min (ref >60.00)
Glucose, Bld: 119 mg/dL — ABNORMAL HIGH (ref 70–99)
POTASSIUM: 3.3 meq/L — AB (ref 3.5–5.1)
SODIUM: 141 meq/L (ref 135–145)

## 2013-08-21 MED ORDER — AMLODIPINE BESYLATE 5 MG PO TABS: 5.0000 mg | ORAL_TABLET | Freq: Every day | ORAL | Status: DC

## 2013-08-21 NOTE — Patient Instructions (Addendum)
We will call with your lab results when available.  We have refilled your amlodipine.  Schedule a physical with your PCP up front prior to leaving.  If emergency symptoms discussed during visit developed, seek medical attention immediately.  Followup as needed, or for worsening or persistent symptoms despite treatment.    Hypertension Hypertension is another name for high blood pressure. High blood pressure forces your heart to work harder to pump blood. A blood pressure reading has two numbers, which includes a higher number over a lower number (example: 110/72). HOME CARE   Have your blood pressure rechecked by your doctor.  Only take medicine as told by your doctor. Follow the directions carefully. The medicine does not work as well if you skip doses. Skipping doses also puts you at risk for problems.  Do not smoke.  Monitor your blood pressure at home as told by your doctor. GET HELP IF:  You think you are having a reaction to the medicine you are taking.  You have repeat headaches or feel dizzy.  You have puffiness (swelling) in your ankles.  You have trouble with your vision. GET HELP RIGHT AWAY IF:   You get a very bad headache and are confused.  You feel weak, numb, or faint.  You get chest or belly (abdominal) pain.  You throw up (vomit).  You cannot breathe very well. MAKE SURE YOU:   Understand these instructions.  Will watch your condition.  Will get help right away if you are not doing well or get worse. Document Released: 06/30/2007 Document Revised: 01/16/2013 Document Reviewed: 11/03/2012 [facility] Patient Information 2015 [place], [place]. This information is not intended to replace advice given to you by your health care provider. Make sure you discuss any questions you have with your health care provider.

## 2013-08-21 NOTE — Progress Notes (Signed)
Pre visit review using our clinic review tool, if applicable. No additional management support is needed unless otherwise documented below in the visit note. 

## 2013-08-21 NOTE — Progress Notes (Signed)
Subjective:    Patient ID: Joe Hill, male    DOB: 04-Feb-1940, 73 y.o.   MRN: [id number]  HPI Patient is a 73 y.o. male presenting for medication management for HTN. Pt takes amlodipine for HTN as monotherapy. He states that he tolerates this well and denies adverse effects to this medication. Asymptomatic, no complaints. Patient denies fevers, chills, nausea, vomiting, diarrhea, shortness of breath, chest pain, headache, syncope.    Review of Systems As per history of present illness and are otherwise negative.   Past Medical History  Diagnosis Date  . 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 COLONIC POLYPS, HX OF 10/11/2006  . COPD 03/31/2009  . EMPHYSEMA 10/16/2009  . GERD 09/09/2009  . HEMOPTYSIS UNSPECIFIED 07/30/2009  . HYPERGLYCEMIA 08/12/2009  . HYPERTENSION 08/04/2007  . 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 TOBACCO USE 01/30/2007    History   Social History  . Marital Status: Single    Spouse Name: N/A    Number of Children: N/A  . Years of Education: N/A   Occupational History  . Not on file.   Social History Main Topics  . Smoking status: Current Every Day Smoker -- 0.50 packs/day    Types: Cigarettes  . Smokeless tobacco: Never Used  . Alcohol Use: No  . Drug Use: No  . Sexual Activity: Not on file   Other Topics Concern  . Not on file   Social History Narrative  . No narrative on file    Past Surgical History  Procedure Laterality Date  . Appendectomy    . Prostate surgery      prostatectomy  . Cataract extraction      Family History  Problem Relation Age of Onset  . Hyperlipidemia Mother   . Cancer Father     lung  . Cancer Sister     lung  . Arthritis Maternal Aunt     Allergies  Allergen Reactions  . Penicillins     Current Outpatient Prescriptions on File Prior to Visit  Medication Sig Dispense Refill  . ibuprofen (ADVIL,MOTRIN) 200 MG tablet Take 200 mg by mouth every 6 (six) hours as needed for pain.      . naproxen (NAPROSYN)  500 MG tablet Take 1 tablet (500 mg total) by mouth 2 (two) times daily with a meal.  30 tablet  0  . potassium chloride (K-DUR) 10 MEQ tablet Take 1 tablet (10 mEq total) by mouth daily.  10 tablet  0  . tamsulosin (FLOMAX) 0.4 MG CAPS capsule Take 1 capsule (0.4 mg total) by mouth 2 (two) times daily.  10 capsule  0   No current facility-administered medications on file prior to visit.    EXAM: BP 120/68  Pulse 60  Temp(Src) 98.4 F (36.9 C) (Oral)  Resp 18  Wt 215 lb (97.523 kg)     Objective:   Physical Exam  Nursing note and vitals reviewed. Constitutional: He is oriented to person, place, and time. He appears well-developed and well-nourished. No distress.  HENT:  Head: Normocephalic and atraumatic.  Eyes: Conjunctivae and EOM are normal. Pupils are equal, round, and reactive to light.  Cardiovascular: Normal rate, regular rhythm and intact distal pulses.   Pulmonary/Chest: Effort normal and breath sounds normal. No respiratory distress. He exhibits no tenderness.  Musculoskeletal: He exhibits no edema.  Neurological: He is alert and oriented to person, place, and time.  Skin: Skin is warm and dry. No rash noted. He is not diaphoretic. No  erythema. No pallor.  Psychiatric: He has a normal mood and affect. His behavior is normal. Judgment and thought content normal.     Lab Results  Component Value Date   WBC 10.8* 03/05/2013   HGB 15.4 03/05/2013   HCT 41.2 03/05/2013   PLT 229 03/05/2013   GLUCOSE 109* 03/05/2013   CHOL 126 03/14/2012   TRIG 74.0 03/14/2012   HDL 33.30* 03/14/2012   LDLCALC 78 03/14/2012   ALT 31 03/05/2013   AST 45* 03/05/2013   NA 138 03/05/2013   K 3.0* 03/05/2013   CL 96 03/05/2013   CREATININE 1.31 03/05/2013   BUN 12 03/05/2013   CO2 27 03/05/2013   TSH 1.09 05/02/2007   PSA 4.67* 06/14/2006   HGBA1C 4.9 10/02/2009        Assessment & Plan:  Joe Hill was seen today for follow-up.  Diagnoses and associated orders for this visit:  Unspecified essential  hypertension Comments: Stable on amlodipine. Will obtain BMP. Will continue. - amLODipine (NORVASC) 5 MG tablet; Take 1 tablet (5 mg total) by mouth daily. - Basic Metabolic Panel    Will have pt schedule physical with PCP prior to leaving today.  Return precautions provided, and patient handout on HTN.  Plan to follow up as needed, or for worsening or persistent symptoms despite treatment.  Patient Instructions  We will call with your lab results when available.  We have refilled your amlodipine.  Schedule a physical with your PCP up front prior to leaving.  If emergency symptoms discussed during visit developed, seek medical attention immediately.  Followup as needed, or for worsening or persistent symptoms despite treatment.

## 2013-10-15 ENCOUNTER — Emergency Department (HOSPITAL_COMMUNITY): Payer: Medicare Other

## 2013-10-15 ENCOUNTER — Encounter (HOSPITAL_COMMUNITY): Payer: Self-pay | Admitting: Emergency Medicine

## 2013-10-15 ENCOUNTER — Emergency Department (HOSPITAL_COMMUNITY)
Admission: EM | Admit: 2013-10-15 | Discharge: 2013-10-15 | Disposition: A | Discharge status: Home / Self Care | Payer: Medicare Other | Attending: Emergency Medicine | Admitting: Emergency Medicine

## 2013-10-15 DIAGNOSIS — Z860100 Personal history of colon polyps, unspecified: Secondary | ICD-10-CM | POA: Insufficient documentation

## 2013-10-15 DIAGNOSIS — Z8719 Personal history of other diseases of the digestive system: Secondary | ICD-10-CM | POA: Diagnosis not present

## 2013-10-15 DIAGNOSIS — F17200 Nicotine dependence, unspecified, uncomplicated: Secondary | ICD-10-CM | POA: Insufficient documentation

## 2013-10-15 DIAGNOSIS — Z8639 Personal history of other endocrine, nutritional and metabolic disease: Secondary | ICD-10-CM | POA: Insufficient documentation

## 2013-10-15 DIAGNOSIS — I10 Essential (primary) hypertension: Secondary | ICD-10-CM | POA: Insufficient documentation

## 2013-10-15 DIAGNOSIS — Z8546 Personal history of malignant neoplasm of prostate: Secondary | ICD-10-CM | POA: Insufficient documentation

## 2013-10-15 DIAGNOSIS — Z862 Personal history of diseases of the blood and blood-forming organs and certain disorders involving the immune mechanism: Secondary | ICD-10-CM | POA: Diagnosis not present

## 2013-10-15 DIAGNOSIS — Z880 Allergy status to penicillin: Secondary | ICD-10-CM | POA: Diagnosis not present

## 2013-10-15 DIAGNOSIS — Z79899 Other long term (current) drug therapy: Secondary | ICD-10-CM | POA: Diagnosis not present

## 2013-10-15 DIAGNOSIS — R042 Hemoptysis: Secondary | ICD-10-CM | POA: Diagnosis not present

## 2013-10-15 DIAGNOSIS — J438 Other emphysema: Secondary | ICD-10-CM | POA: Insufficient documentation

## 2013-10-15 LAB — TROPONIN I

## 2013-10-15 LAB — COMPREHENSIVE METABOLIC PANEL
ALT: 11 U/L (ref 0–53)
AST: 18 U/L (ref 0–37)
Albumin: 4.2 g/dL (ref 3.5–5.2)
Alkaline Phosphatase: 110 U/L (ref 39–117)
Anion gap: 12 (ref 5–15)
BUN: 16 mg/dL (ref 6–23)
CO2: 23 mEq/L (ref 19–32)
CREATININE: 0.84 mg/dL (ref 0.50–1.35)
Calcium: 8.5 mg/dL (ref 8.4–10.5)
Chloride: 100 mEq/L (ref 96–112)
GFR calc Af Amer: >90 mL/min (ref >90)
GFR, EST NON AFRICAN AMERICAN: 85 mL/min — AB (ref >90)
Glucose, Bld: 120 mg/dL — ABNORMAL HIGH (ref 70–99)
Potassium: 3.6 mEq/L — ABNORMAL LOW (ref 3.7–5.3)
Sodium: 135 mEq/L — ABNORMAL LOW (ref 137–147)
Total Bilirubin: 0.7 mg/dL (ref 0.3–1.2)
Total Protein: 7.5 g/dL (ref 6.0–8.3)

## 2013-10-15 LAB — CBC WITH DIFFERENTIAL/PLATELET
Basophils Absolute: 0 10*3/uL (ref 0.0–0.1)
Basophils Relative: 0 % (ref 0–1)
Eosinophils Absolute: 0.2 10*3/uL (ref 0.0–0.7)
Eosinophils Relative: 2 % (ref 0–5)
HEMATOCRIT: 41.6 % (ref 39.0–52.0)
HEMOGLOBIN: 15 g/dL (ref 13.0–17.0)
LYMPHS PCT: 14 % (ref 12–46)
Lymphs Abs: 1.1 10*3/uL (ref 0.7–4.0)
MCH: 32.6 pg (ref 26.0–34.0)
MCHC: 36.1 g/dL — ABNORMAL HIGH (ref 30.0–36.0)
MCV: 90.4 fL (ref 78.0–100.0)
MONO ABS: 0.6 10*3/uL (ref 0.1–1.0)
MONOS PCT: 7 % (ref 3–12)
NEUTROS ABS: 6 10*3/uL (ref 1.7–7.7)
Neutrophils Relative %: 77 % (ref 43–77)
Platelets: 218 10*3/uL (ref 150–400)
RBC: 4.6 MIL/uL (ref 4.22–5.81)
RDW: 12.8 % (ref 11.5–15.5)
WBC: 7.9 10*3/uL (ref 4.0–10.5)

## 2013-10-15 MED ORDER — IOHEXOL 350 MG/ML SOLN
100.0000 mL | Freq: Once | INTRAVENOUS | Status: AC | PRN
  Administered 2013-10-15: 100 mL via INTRAVENOUS

## 2013-10-15 NOTE — ED Notes (Signed)
Pt states that he coughed prior to coming to the ED and coughed up blood, he describes it as bright red blood, his blood pressure is elevated in triage

## 2013-10-15 NOTE — ED Provider Notes (Signed)
CSN: [id number]     Arrival date & time 10/15/13  2030 History   First MD Initiated Contact with Patient 10/15/13 2045     Chief Complaint  Patient presents with  . Hemoptysis     (Consider location/radiation/quality/duration/timing/severity/associated sxs/prior Treatment) HPI Comments: Patient is a 73 yo M PMHx significant for COPD, GERD, HTN, Hx of a Prostate cancer, Hx of hemoptysis presenting to the ED for hemoptysis that started this evening. Patient states he had "rattling in my chest" and started coughing and "tasted something metallic in my mouth." He describes the sputum production as a combination of mucous streaked with blood and straight blood. Denies any excess NSAID use, ETOH use, hematemesis, black or tarry stools, bright red blood per rectum.    Past Medical History  Diagnosis Date  . 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 COLONIC POLYPS, HX OF 10/11/2006  . COPD 03/31/2009  . EMPHYSEMA 10/16/2009  . GERD 09/09/2009  . HEMOPTYSIS UNSPECIFIED 07/30/2009  . HYPERGLYCEMIA 08/12/2009  . HYPERTENSION 08/04/2007  . 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 TOBACCO USE 01/30/2007   Past Surgical History  Procedure Laterality Date  . Appendectomy    . Prostate surgery      prostatectomy  . Cataract extraction     Family History  Problem Relation Age of Onset  . Hyperlipidemia Mother   . Cancer Father     lung  . Cancer Sister     lung  . Arthritis Maternal Aunt    History  Substance Use Topics  . Smoking status: Current Every Day Smoker -- 0.50 packs/day    Types: Cigarettes  . Smokeless tobacco: Never Used  . Alcohol Use: No    Review of Systems  Respiratory:       Hemoptysis.   All other systems reviewed and are negative.     Allergies  Penicillins  Home Medications   Prior to Admission medications   Medication Sig Start Date End Date Taking? Authorizing Provider  amLODipine (NORVASC) 5 MG tablet Take 1 tablet (5 mg total) by mouth daily. 08/21/13  Yes Zenaida Niece, PA-C  ibuprofen (ADVIL,MOTRIN) 200 MG tablet Take 200 mg by mouth every 6 (six) hours as needed (for pain).    Yes Historical Provider, MD   BP 143/65  Pulse 54  Temp(Src) 98.5 F (36.9 C) (Oral)  Resp 15  Ht 5\' 10"  (1.778 m)  Wt 220 lb (99.791 kg)  BMI 31.57 kg/m2  SpO2 93% Physical Exam  Nursing note and vitals reviewed. Constitutional: He is oriented to person, place, and time. He appears well-developed and well-nourished. No distress.  HENT:  Head: Normocephalic and atraumatic.  Right Ear: External ear normal.  Left Ear: External ear normal.  Nose: Nose normal.  Mouth/Throat: Uvula is midline, oropharynx is clear and moist and mucous membranes are normal. No oropharyngeal exudate.  Eyes: Conjunctivae are normal.  Neck: Normal range of motion. Neck supple.  Cardiovascular: Normal rate, regular rhythm and normal heart sounds.   Pulmonary/Chest: Effort normal and breath sounds normal. No respiratory distress.  Abdominal: Soft. Bowel sounds are normal. There is no tenderness.  Musculoskeletal: Normal range of motion.  Neurological: He is alert and oriented to person, place, and time.  Skin: Skin is warm and dry. He is not diaphoretic.  Psychiatric: He has a normal mood and affect.    ED Course  Procedures (including critical care time) Medications  iohexol (OMNIPAQUE) 350 MG/ML injection 100 mL (100 mLs Intravenous Contrast Given  10/15/13 2238)    Labs Review Labs Reviewed  CBC WITH DIFFERENTIAL - Abnormal; Notable for the following:    MCHC 36.1 (*)    All other components within normal limits  COMPREHENSIVE METABOLIC PANEL - Abnormal; Notable for the following:    Sodium 135 (*)    Potassium 3.6 (*)    Glucose, Bld 120 (*)    GFR calc non Af Amer 85 (*)    All other components within normal limits  TROPONIN I    Imaging Review Dg Chest 2 View  10/15/2013   CLINICAL DATA:  Hemoptysis.  Cough.  EXAM: CHEST  2 VIEW  COMPARISON:  Chest CT 09/09/2010   FINDINGS: Pulmonary hyperinflation and apical emphysematous change. No superimposed edema or pneumonia suspected. No effusion or pneumothorax. Symmetric biapical pleural thickening. Questionable 16 mm nodule at the right apex.  No cardiomegaly.  IMPRESSION: 1. Emphysema without acute superimposed findings. 2. Questionable pulmonary nodule at the right apex. Suggest nonemergent chest CT followup.   Electronically Signed   By: Jorje Guild M.D.   On: 10/15/2013 22:04   Ct Angio Chest W/cm &/or Wo Cm  10/15/2013   CLINICAL DATA:  Hemoptysis.  History prostate cancer.  EXAM: CT ANGIOGRAPHY CHEST WITH CONTRAST  TECHNIQUE: Multidetector CT imaging of the chest was performed using the standard protocol during bolus administration of intravenous contrast. Multiplanar CT image reconstructions and MIPs were obtained to evaluate the vascular anatomy.  CONTRAST:  135mL OMNIPAQUE IOHEXOL 350 MG/ML SOLN  COMPARISON:  09/09/2010  FINDINGS: THORACIC INLET/BODY WALL:  No acute abnormality.  MEDIASTINUM:  Normal heart size. No pericardial effusion. Scattered atherosclerosis, including the aorta and coronary arteries. No aortic aneurysm or dissection. No adenopathy. Negative esophagus. Negative for pulmonary embolism.  LUNG WINDOWS:  Moderate centrilobular emphysema. Stable pleural scarring and subpleural nodularity in the right more than left apices. There is no suspicious pulmonary nodule. No consolidation. No effusion or pneumothorax.  UPPER ABDOMEN:  No significant findings.  No change from abdominal CT 03/06/2013.  OSSEOUS:  No acute fracture.  No suspicious lytic or blastic lesions.  Review of the MIP images confirms the above findings.  IMPRESSION: 1. Negative for pulmonary embolism or other cause of hemoptysis. 2. Moderate centrilobular emphysema.   Electronically Signed   By: Jorje Guild M.D.   On: 10/15/2013 22:57     EKG Interpretation None      MDM   Final diagnoses:  Hemoptysis    Filed Vitals:    10/15/13 2308  BP:   Pulse:   Temp: 98.5 F (36.9 C)  Resp:    Afebrile, NAD, non-toxic appearing, AAOx4. Physical examination unremarkable. I have reviewed nursing notes, vital signs, and all appropriate lab and imaging results for this patient. CT angio chest obtained w/o evidence of PE or pulmonary nodule. Hgb and HCT stable. Patient hemodynamically stable. The patient can followup with his primary care physician for further evaluation of his hemoptysis. Discussed the importance of tobacco cessation. Return precautions discussed. Patient is agreeable to plan. Patient is stable at time of discharge. Patient d/w with Dr. Wilson Singer, agrees with plan.         Harlow Mares, PA-C 10/16/13 [phone]

## 2013-10-15 NOTE — Discharge Instructions (Signed)
Please follow up with your primary care physician in 1-2 days. If you do not have one please call the [facility] number listed above. Please read all discharge instructions and return precautions.    Hemoptysis Hemoptysis, which means coughing up blood, can be a sign of a minor problem or a serious medical condition. The blood that is coughed up may come from the lungs and airways. Coughed-up blood can also come from bleeding that occurs outside the lungs and airways. Blood can drain into the windpipe during a severe nosebleed or when blood is vomited from the stomach. Because hemoptysis can be a sign of something serious, a medical evaluation is required. For some people with hemoptysis, no definite cause is ever identified. CAUSES  The most common cause of hemoptysis is bronchitis. Some other common causes include:   A ruptured blood vessel caused by coughing or an infection.   A medical condition that causes damage to the large air passageways (bronchiectasis).   A blood clot in the lungs (pulmonary embolism).   Pneumonia.   Tuberculosis.   Breathing in a small foreign object.   Cancer. For some people with hemoptysis, no definite cause is ever identified.  HOME CARE INSTRUCTIONS  Only take over-the-counter or prescription medicines as directed by your caregiver. Do not use cough suppressants unless your caregiver approves.  If your caregiver prescribes antibiotic medicines, take them as directed. Finish them even if you start to feel better.  Do not smoke. Also avoid secondhand smoke.  Follow up with your caregiver as directed. SEEK IMMEDIATE MEDICAL CARE IF:   You cough up bloody mucus for longer than a week.  You have a blood-producing cough that is severe or getting worse.  You have a blood-producing cough thatcomes and goes over time.  You develop problems with your breathing.   You vomit blood.  You develop bloody or black-colored  stools.  You have chest pain.   You develop night sweats.  You feel faint or pass out.   You have a fever or persistent symptoms for more than 2-3 days.  You have a fever and your symptoms suddenly get worse. MAKE SURE YOU:  Understand these instructions.  Will watch your condition.  Will get help right away if you are not doing well or get worse. Document Released: 03/22/2001 Document Revised: 12/29/2011 Document Reviewed: 10/29/2011 [facility] Patient Information 2015 [place], [place]. This information is not intended to replace advice given to you by your health care provider. Make sure you discuss any questions you have with your health care provider.

## 2013-10-16 ENCOUNTER — Encounter: Payer: Self-pay | Admitting: Family Medicine

## 2013-10-16 ENCOUNTER — Ambulatory Visit (INDEPENDENT_AMBULATORY_CARE_PROVIDER_SITE_OTHER): Payer: Medicare Other | Admitting: Family Medicine

## 2013-10-16 VITALS — BP 122/78 | HR 58 | Temp 98.0°F | Ht 70.0 in | Wt 210.0 lb

## 2013-10-16 DIAGNOSIS — J069 Acute upper respiratory infection, unspecified: Secondary | ICD-10-CM | POA: Diagnosis not present

## 2013-10-16 DIAGNOSIS — J438 Other emphysema: Secondary | ICD-10-CM

## 2013-10-16 DIAGNOSIS — R042 Hemoptysis: Secondary | ICD-10-CM | POA: Diagnosis not present

## 2013-10-16 DIAGNOSIS — J449 Chronic obstructive pulmonary disease, unspecified: Secondary | ICD-10-CM | POA: Diagnosis not present

## 2013-10-16 DIAGNOSIS — F17200 Nicotine dependence, unspecified, uncomplicated: Secondary | ICD-10-CM

## 2013-10-16 NOTE — Progress Notes (Signed)
Pre visit review using our clinic review tool, if applicable. No additional management support is needed unless otherwise documented below in the visit note. 

## 2013-10-16 NOTE — Patient Instructions (Signed)
-  for 5 days, very gently applied antibiotic ointment to area in nose and have Dr. Yong Channel check this when you see him  -if continued coughing of blood will ned further evaluation  -call the [facility] and consider medications to help you quit smoking- please quit  -follow up with Dr. Yong Channel as scheduled or see a doctor immediately if worsening or new concerns

## 2013-10-16 NOTE — Progress Notes (Signed)
No chief complaint on file.   HPI:  Joe Hill, a patient of Dr. Leanne Chang, with MH COPD, emphysema, smoking, GERD, hemoptysis, HTN , HLD, prostate cancer presents ED follow up for:  1) Hemoptysis: -cough yesterday with increased mucus production and streak of blood in sputum - a small streak today -evaluated extensively in the ED yesterday with labs, CXR and CT angio all ok except for moderate centrilobular emphysema and told to follow up with PCP -denies: SOB, fever, hematochezia, melena, bleeding elsewhere, , SOB, wheezing, CP -has felt like has a mild flu the last few days, sinus congestion, cough, nasal congestion, mild sore throat -has chronic cough, COPD, continues to smoke, occ wheeze -occ nasal congestion and dry mucus membranes in the nose -reports he had this in the past and was thought to be due to ibuprofen and he had been taking normal amounts of this -smokes about 1/2 ppd, starting to cut back, not interested in quitting right now but maybe in the future Note: he was not aware of Dr. Leanne Chang leaving.  ROS: See pertinent positives and negatives per HPI.  Past Medical History  Diagnosis Date  . 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 COLONIC POLYPS, HX OF 10/11/2006  . COPD 03/31/2009  . EMPHYSEMA 10/16/2009  . GERD 09/09/2009  . HEMOPTYSIS UNSPECIFIED 07/30/2009  . HYPERGLYCEMIA 08/12/2009  . HYPERTENSION 08/04/2007  . 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 TOBACCO USE 01/30/2007    Past Surgical History  Procedure Laterality Date  . Appendectomy    . Prostate surgery      prostatectomy  . Cataract extraction      Family History  Problem Relation Age of Onset  . Hyperlipidemia Mother   . Cancer Father     lung  . Cancer Sister     lung  . Arthritis Maternal Aunt     History   Social History  . Marital Status: Single    Spouse Name: N/A    Number of Children: N/A  . Years of Education: N/A   Social History Main Topics  . Smoking status: Current Every Day Smoker  -- 0.50 packs/day    Types: Cigarettes  . Smokeless tobacco: Never Used  . Alcohol Use: No  . Drug Use: No  . Sexual Activity: None   Other Topics Concern  . None   Social History Narrative  . None    Current outpatient prescriptions:amLODipine (NORVASC) 5 MG tablet, Take 1 tablet (5 mg total) by mouth daily., Disp: 90 tablet, Rfl: 2;  ibuprofen (ADVIL,MOTRIN) 200 MG tablet, Take 200 mg by mouth every 6 (six) hours as needed (for pain). , Disp: , Rfl:   EXAM:  Filed Vitals:   10/16/13 1340  BP: 122/78  Pulse: 58  Temp: 98 F (36.7 C)    Body mass index is 30.13 kg/(m^2).  GENERAL: vitals reviewed and listed above, alert, oriented, appears well hydrated and in no acute distress  HEENT: atraumatic, conjunttiva clear, no obvious abnormalities on inspection of external nose and ears, normal appearance of ear canals and TMs, clear nasal congestion, mild irritation of R ant septal nasal mucosal with small amount of dried blood, mild post oropharyngeal erythema with PND, no tonsillar edema or exudate, no sinus TTP  NECK: no obvious masses on inspection  LUNGS: clear to auscultation bilaterally, no wheezes, rales or rhonchi, good air movement  CV: HRRR, no peripheral edema  MS: moves all extremities without noticeable abnormality  PSYCH: pleasant and cooperative, no obvious  depression or anxiety  ASSESSMENT AND PLAN:  Discussed the following assessment and plan:  Hemoptysis -s/p extensive eval with cxr, CT angio, labs -discussed seeing pulm - but he reports did this before and nothing further done, certainly should see them if persists, has COPDbut no trouble breathing -query from nasal membranes given findings on exam - likely from dryer weather with VURI - apply ointment and close follow up to evaluate -return and emergency precautions discussed  Viral upper respiratory infection -supportive care  COPD/EMPHYSEMA/TOBACCO USE -no SOB, DOE -advised to quit smoking and  counseled for >3<10 minutes -quit line information provided  -Patient advised to return or notify a doctor immediately if symptoms worsen or persist or new concerns arise.  Patient Instructions  -for 5 days, very gently applied antibiotic ointment to area in nose and have Dr. Yong Channel check this when you see him  -if continued coughing of blood will ned further evaluation  -call the [facility] and consider medications to help you quit smoking- please quit  -follow up with Dr. Yong Channel as scheduled or see a doctor immediately if worsening or new concerns     Joe Hill, Joe Soho R.

## 2013-10-18 NOTE — ED Provider Notes (Signed)
Medical screening examination/treatment/procedure(s) were performed by non-physician practitioner and as supervising physician I was immediately available for consultation/collaboration.   EKG Interpretation None       Virgel Manifold, MD 10/18/13 1501

## 2013-10-30 ENCOUNTER — Ambulatory Visit: Payer: Medicare Other | Admitting: Family Medicine

## 2013-11-01 DIAGNOSIS — Z8546 Personal history of malignant neoplasm of prostate: Secondary | ICD-10-CM | POA: Diagnosis not present

## 2013-11-07 DIAGNOSIS — N393 Stress incontinence (female) (male): Secondary | ICD-10-CM | POA: Diagnosis not present

## 2013-11-07 DIAGNOSIS — Z8546 Personal history of malignant neoplasm of prostate: Secondary | ICD-10-CM | POA: Diagnosis not present

## 2013-11-07 DIAGNOSIS — N5201 Erectile dysfunction due to arterial insufficiency: Secondary | ICD-10-CM | POA: Diagnosis not present

## 2014-04-04 ENCOUNTER — Ambulatory Visit: Payer: Medicare Other | Admitting: Family Medicine

## 2014-04-09 ENCOUNTER — Encounter: Payer: Self-pay | Admitting: Internal Medicine

## 2014-04-09 ENCOUNTER — Ambulatory Visit (INDEPENDENT_AMBULATORY_CARE_PROVIDER_SITE_OTHER): Payer: Medicare Other | Admitting: Internal Medicine

## 2014-04-09 ENCOUNTER — Telehealth: Payer: Self-pay | Admitting: Family Medicine

## 2014-04-09 VITALS — BP 128/70 | HR 54 | Temp 98.1°F | Resp 20 | Ht 70.0 in | Wt 214.0 lb

## 2014-04-09 DIAGNOSIS — F17200 Nicotine dependence, unspecified, uncomplicated: Secondary | ICD-10-CM

## 2014-04-09 DIAGNOSIS — Z720 Tobacco use: Secondary | ICD-10-CM

## 2014-04-09 DIAGNOSIS — J069 Acute upper respiratory infection, unspecified: Secondary | ICD-10-CM | POA: Diagnosis not present

## 2014-04-09 DIAGNOSIS — I10 Essential (primary) hypertension: Secondary | ICD-10-CM

## 2014-04-09 DIAGNOSIS — B9789 Other viral agents as the cause of diseases classified elsewhere: Secondary | ICD-10-CM

## 2014-04-09 DIAGNOSIS — J441 Chronic obstructive pulmonary disease with (acute) exacerbation: Secondary | ICD-10-CM | POA: Diagnosis not present

## 2014-04-09 MED ORDER — HYDROCODONE-HOMATROPINE 5-1.5 MG/5ML PO SYRP: 5.0000 mL | ORAL_SOLUTION | Freq: Four times a day (QID) | ORAL | Status: DC | PRN

## 2014-04-09 NOTE — Progress Notes (Signed)
Pre visit review using our clinic review tool, if applicable. No additional management support is needed unless otherwise documented below in the visit note. 

## 2014-04-09 NOTE — Patient Instructions (Signed)
Acute bronchitis symptoms for less than 10 days are generally not helped by antibiotics.  Take over-the-counter expectorants and cough medications such as  Mucinex DM.  Call if there is no improvement in 5 to 7 days or if  you develop worsening cough, fever, or new symptoms, such as shortness of breath or chest pain.  TREATMENT  Acute bronchitis usually goes away in a couple weeks. Oftentimes, no medical treatment is necessary. Medicines are sometimes given for relief of fever or cough. Antibiotic medicines are usually not needed but may be prescribed in certain situations. In some cases, an inhaler may be recommended to help reduce shortness of breath and control the cough. A cool mist vaporizer may also be used to help thin bronchial secretions and make it easier to clear the chest.  HOME CARE INSTRUCTIONS  Get plenty of rest.  Drink enough fluids to keep your urine clear or pale yellow (unless you have a medical condition that requires fluid restriction). Increasing fluids may help thin your respiratory secretions (sputum) and reduce chest congestion, and it will prevent dehydration.  Take medicines only as directed by your health care provider.  [name]  Avoid smoking and secondhand smoke. Exposure to cigarette smoke or irritating chemicals will make bronchitis worse. If you are a smoker, consider using nicotine gum or skin patches to help control withdrawal symptoms. Quitting smoking will help your lungs heal faster.  Reduce the chances of another bout of acute bronchitis by washing your hands frequently, avoiding people with cold symptoms, and trying not to touch your hands to your mouth, nose, or eyes.  Keep all follow-up visits as directed by your health care provider.

## 2014-04-09 NOTE — Progress Notes (Signed)
Subjective:    Patient ID: Joe Hill, male    DOB: [birth year], 74 y.o.   MRN: [id number]  HPI 74 year old patient who has a history of essential hypertension and ongoing tobacco use.  He presents with a 5 day history of rhinorrhea and cough.  He has persistent chest congestion and mild the productive cough.  He has been using no OTC medications.  His cough and rhinorrhea seen to have improved.  There is been no fever or wheezing. He does have a history of COPD and impaired glucose tolerance.  He is scheduled for a physical in the fall  Past Medical History  Diagnosis Date  . 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 COLONIC POLYPS, HX OF 10/11/2006  . COPD 03/31/2009  . EMPHYSEMA 10/16/2009  . GERD 09/09/2009  . HEMOPTYSIS UNSPECIFIED 07/30/2009  . HYPERGLYCEMIA 08/12/2009  . HYPERTENSION 08/04/2007  . 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 TOBACCO USE 01/30/2007    History   Social History  . Marital Status: Single    Spouse Name: N/A  . Number of Children: N/A  . Years of Education: N/A   Occupational History  . Not on file.   Social History Main Topics  . Smoking status: Current Every Day Smoker -- 0.50 packs/day    Types: Cigarettes  . Smokeless tobacco: Never Used  . Alcohol Use: No  . Drug Use: No  . Sexual Activity: Not on file   Other Topics Concern  . Not on file   Social History Narrative    Past Surgical History  Procedure Laterality Date  . Appendectomy    . Prostate surgery      prostatectomy  . Cataract extraction      Family History  Problem Relation Age of Onset  . Hyperlipidemia Mother   . Cancer Father     lung  . Cancer Sister     lung  . Arthritis Maternal Aunt     Allergies  Allergen Reactions  . Penicillins Hives    Current Outpatient Prescriptions on File Prior to Visit  Medication Sig Dispense Refill  . amLODipine (NORVASC) 5 MG tablet Take 1 tablet (5 mg total) by mouth daily. 90 tablet 2  . ibuprofen (ADVIL,MOTRIN) 200 MG tablet  Take 200 mg by mouth every 6 (six) hours as needed (for pain).      No current facility-administered medications on file prior to visit.    BP 128/70 mmHg  Pulse 54  Temp(Src) 98.1 F (36.7 C) (Oral)  Resp 20  Ht 5\' 10"  (1.778 m)  Wt 214 lb (97.07 kg)  BMI 30.71 kg/m2  SpO2 97%      Review of Systems  Constitutional: Positive for activity change. Negative for fever, chills, appetite change and fatigue.  HENT: Positive for congestion, postnasal drip, rhinorrhea and sinus pressure. Negative for dental problem, ear pain, hearing loss, sore throat, tinnitus, trouble swallowing and voice change.   Eyes: Negative for pain, discharge and visual disturbance.  Respiratory: Positive for cough. Negative for chest tightness, wheezing and stridor.   Cardiovascular: Negative for chest pain, palpitations and leg swelling.  Gastrointestinal: Negative for nausea, vomiting, abdominal pain, diarrhea, constipation, blood in stool and abdominal distention.  Genitourinary: Negative for urgency, hematuria, flank pain, discharge, difficulty urinating and genital sores.  Musculoskeletal: Negative for myalgias, back pain, joint swelling, arthralgias, gait problem and neck stiffness.  Skin: Negative for rash.  Neurological: Negative for dizziness, syncope, speech difficulty, weakness, numbness and headaches.  Hematological: Negative  for adenopathy. Does not bruise/bleed easily.  Psychiatric/Behavioral: Negative for behavioral problems and dysphoric mood. The patient is not nervous/anxious.        Objective:   Physical Exam  Constitutional: He is oriented to person, place, and time. He appears well-developed.  HENT:  Head: Normocephalic.  Right Ear: External ear normal.  Left Ear: External ear normal.  Eyes: Conjunctivae and EOM are normal.  Neck: Normal range of motion.  Cardiovascular: Normal rate and normal heart sounds.   Pulmonary/Chest: Effort normal and breath sounds normal. No respiratory  distress. He has no wheezes. He has no rales.  Abdominal: Bowel sounds are normal.  Musculoskeletal: Normal range of motion. He exhibits no edema or tenderness.  Neurological: He is alert and oriented to person, place, and time.  Psychiatric: He has a normal mood and affect. His behavior is normal.          Assessment & Plan:   Viral URI with cough.  Will treat symptomatically Patient instructions discussed and dispensed   Hypertension, well-controlled.  Medicines the refilled  CPX as scheduled

## 2014-04-09 NOTE — Telephone Encounter (Signed)
emmi mailed  °

## 2014-05-27 ENCOUNTER — Other Ambulatory Visit: Payer: Self-pay | Admitting: Internal Medicine

## 2014-05-29 ENCOUNTER — Other Ambulatory Visit: Payer: Self-pay | Admitting: Family Medicine

## 2014-07-16 ENCOUNTER — Encounter: Payer: Self-pay | Admitting: Internal Medicine

## 2014-07-16 ENCOUNTER — Telehealth: Payer: Self-pay | Admitting: Family Medicine

## 2014-07-18 NOTE — Telephone Encounter (Signed)
Error

## 2014-10-02 ENCOUNTER — Encounter: Payer: Self-pay | Admitting: Family Medicine

## 2014-10-02 ENCOUNTER — Ambulatory Visit (INDEPENDENT_AMBULATORY_CARE_PROVIDER_SITE_OTHER): Payer: Medicare Other | Admitting: Family Medicine

## 2014-10-02 VITALS — BP 122/74 | HR 55 | Temp 98.1°F | Wt 210.0 lb

## 2014-10-02 DIAGNOSIS — J438 Other emphysema: Secondary | ICD-10-CM

## 2014-10-02 DIAGNOSIS — R739 Hyperglycemia, unspecified: Secondary | ICD-10-CM | POA: Diagnosis not present

## 2014-10-02 DIAGNOSIS — D126 Benign neoplasm of colon, unspecified: Secondary | ICD-10-CM

## 2014-10-02 DIAGNOSIS — Z8546 Personal history of malignant neoplasm of prostate: Secondary | ICD-10-CM

## 2014-10-02 DIAGNOSIS — F17200 Nicotine dependence, unspecified, uncomplicated: Secondary | ICD-10-CM

## 2014-10-02 DIAGNOSIS — Z720 Tobacco use: Secondary | ICD-10-CM | POA: Diagnosis not present

## 2014-10-02 DIAGNOSIS — I10 Essential (primary) hypertension: Secondary | ICD-10-CM

## 2014-10-02 LAB — PSA: PSA: 0.01 ng/mL — AB (ref 0.10–4.00)

## 2014-10-02 LAB — HEMOGLOBIN A1C: Hgb A1c MFr Bld: 5.1 % (ref 4.6–6.5)

## 2014-10-02 NOTE — Progress Notes (Signed)
Joe Reddish, MD Phone: [phone]  Subjective:  Patient presents today to establish care with me as their new primary care provider. Patient was formerly a patient of Dr. Leanne Chang. Chief complaint-noted.   See problem oriented charting ROS- no further hemoptysis, denies chest pain or shortness of breath, able to climb stairs without difficulty   The following were reviewed and entered/updated in epic: Past Medical History  Diagnosis Date  . 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 COLONIC POLYPS, HX OF 10/11/2006  . COPD 03/31/2009  . EMPHYSEMA 10/16/2009  . GERD 09/09/2009  . HEMOPTYSIS UNSPECIFIED 07/30/2009  . HYPERGLYCEMIA 08/12/2009  . HYPERTENSION 08/04/2007  . 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 TOBACCO USE 01/30/2007   Patient Active Problem List   Diagnosis Date Noted  . EMPHYSEMA 10/16/2009    Priority: High  . TOBACCO USE 01/30/2007    Priority: High  . 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 Hyperglycemia 08/12/2009    Priority: Medium  . Essential hypertension 08/04/2007    Priority: Medium  . Abnormal CT scan, chest 09/07/2010    Priority: Low  . GERD 09/09/2009    Priority: Low  . 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/11/2006    Priority: Low   Past Surgical History  Procedure Laterality Date  . Appendectomy    . Prostate surgery      prostatectomy  . Cataract extraction      Family History  Problem Relation Age of Onset  . Hyperlipidemia Mother   . Cancer Father     lung, smoker  . Cancer Sister     breast, smoker  . Arthritis Maternal Aunt     Medications- reviewed and updated Current Outpatient Prescriptions  Medication Sig Dispense Refill  . amLODipine (NORVASC) 5 MG tablet TAKE 1 TABLET (5 MG TOTAL) BY MOUTH DAILY. 90 tablet 2  . ibuprofen (ADVIL,MOTRIN) 200 MG tablet Take 200 mg by mouth every 6 (six) hours as needed (for pain).      No current facility-administered medications for this visit.    Allergies-reviewed and  updated Allergies  Allergen Reactions  . Penicillins Hives    Social History   Social History  . Marital Status: Single    Spouse Name: N/A  . Number of Children: N/A  . Years of Education: N/A   Social History Main Topics  . Smoking status: Current Every Day Smoker -- 0.50 packs/day    Types: Cigarettes  . Smokeless tobacco: Never Used  . Alcohol Use: No  . Drug Use: No  . Sexual Activity: Not Asked   Other Topics Concern  . None   Social History Narrative   Divorced. 2 sons. 5 grandkids.       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       HObbies: woodworking, taking care of yard, very active at work    ROS--See HPI   Objective: BP 122/74 mmHg  Pulse 55  Temp(Src) 98.1 F (36.7 C)  Wt 210 lb (95.255 kg) Gen: NAD, resting comfortably, smells of smoke HEENT: Mucous membranes are moist. Oropharynx normal Neck: no thyromegaly CV: RRR no murmurs rubs or gallops Lungs: CTAB no crackles, wheeze, rhonchi Abdomen: soft/nontender/nondistended/normal bowel sounds. No rebound or guarding.  Ext: no edema Skin: warm, dry, no rash Neuro: grossly normal, moves all extremities, PERRLA   Assessment/Plan:   TOBACCO USE S:1 PPD or 2 days. 60 years. At least 30 pack.  Not interested in quitting A/P: advised cessation, patient declines. Also declines lung cancer screening program but wants me to ask again next visit   PROSTATE CANCER, HX OF S:Dr. Jeffie Pollock previously-now released 2008 or so. Has had undetectable PSA <0.01. Now to check with PCP yearly.  A/P: check PSA today   EMPHYSEMA S: known by CT and PFTs but patient largely asymptomatic A/P: inhalers primarily for symptom treatment so we will not start as asymptomatic. Needs to quit smoking- declines   Hyperglycemia S: previous CBGs between 100-125 A/P: check a1c   Essential hypertension S: controlled. On Amlodipine '5mg'$   A/P:Continue  current meds.    Adenomatous colon polyp S: 2011 several adenomatous polyps A/P: was supposed to have 3 year repeat, refer at this time back to GI   6 month CPE  Orders Placed This Encounter  Procedures  . Hemoglobin A1c    [place]  . PSA  . Ambulatory referral to Gastroenterology    Referral Priority:  Routine    Referral Type:  Consultation    Referral Reason:  Specialty Services Required    Number of Visits Requested:  1

## 2014-10-02 NOTE — Assessment & Plan Note (Signed)
S: 2011 several adenomatous polyps A/P: was supposed to have 3 year repeat, refer at this time back to GI

## 2014-10-02 NOTE — Assessment & Plan Note (Signed)
S: previous CBGs between 100-125 A/P: check a1c

## 2014-10-02 NOTE — Assessment & Plan Note (Signed)
S:Dr. Jeffie Pollock previously-now released 2008 or so. Has had undetectable PSA <0.01. Now to check with PCP yearly.  A/P: check PSA today

## 2014-10-02 NOTE — Assessment & Plan Note (Signed)
S: known by CT and PFTs but patient largely asymptomatic A/P: inhalers primarily for symptom treatment so we will not start as asymptomatic. Needs to quit smoking- declines

## 2014-10-02 NOTE — Patient Instructions (Addendum)
Labs before you go  See me for a physical in 6 months or so.   Please strongly consider if you would like to do the lung cancer screening. WIth your smoking history and family history I would strongly consider it.   Come in fasting at physical and we will update other needed labs

## 2014-10-02 NOTE — Assessment & Plan Note (Signed)
S: controlled. On Amlodipine '5mg'$   A/P:Continue current meds.

## 2014-10-02 NOTE — Assessment & Plan Note (Signed)
S:1 PPD or 2 days. 60 years. At least 30 pack. Not interested in quitting A/P: advised cessation, patient declines. Also declines lung cancer screening program but wants me to ask again next visit

## 2015-03-04 ENCOUNTER — Other Ambulatory Visit: Payer: Self-pay | Admitting: Family Medicine

## 2015-03-06 ENCOUNTER — Encounter (INDEPENDENT_AMBULATORY_CARE_PROVIDER_SITE_OTHER): Payer: Medicare Other | Admitting: Ophthalmology

## 2015-03-06 DIAGNOSIS — H26493 Other secondary cataract, bilateral: Secondary | ICD-10-CM | POA: Diagnosis not present

## 2015-03-06 DIAGNOSIS — I10 Essential (primary) hypertension: Secondary | ICD-10-CM | POA: Diagnosis not present

## 2015-03-06 DIAGNOSIS — H43813 Vitreous degeneration, bilateral: Secondary | ICD-10-CM

## 2015-03-06 DIAGNOSIS — H35033 Hypertensive retinopathy, bilateral: Secondary | ICD-10-CM

## 2015-03-27 ENCOUNTER — Ambulatory Visit (INDEPENDENT_AMBULATORY_CARE_PROVIDER_SITE_OTHER): Payer: Medicare Other | Admitting: Ophthalmology

## 2015-03-27 DIAGNOSIS — H2702 Aphakia, left eye: Secondary | ICD-10-CM

## 2015-04-01 ENCOUNTER — Encounter: Payer: Self-pay | Admitting: Family Medicine

## 2015-04-01 ENCOUNTER — Ambulatory Visit (INDEPENDENT_AMBULATORY_CARE_PROVIDER_SITE_OTHER): Payer: Medicare Other | Admitting: Family Medicine

## 2015-04-01 VITALS — BP 118/70 | HR 64 | Temp 97.7°F | Ht 70.0 in | Wt 207.0 lb

## 2015-04-01 DIAGNOSIS — R739 Hyperglycemia, unspecified: Secondary | ICD-10-CM

## 2015-04-01 DIAGNOSIS — Z8546 Personal history of malignant neoplasm of prostate: Secondary | ICD-10-CM

## 2015-04-01 DIAGNOSIS — Z0001 Encounter for general adult medical examination with abnormal findings: Secondary | ICD-10-CM

## 2015-04-01 DIAGNOSIS — Z0000 Encounter for general adult medical examination without abnormal findings: Secondary | ICD-10-CM | POA: Diagnosis not present

## 2015-04-01 DIAGNOSIS — I10 Essential (primary) hypertension: Secondary | ICD-10-CM

## 2015-04-01 DIAGNOSIS — Z1211 Encounter for screening for malignant neoplasm of colon: Secondary | ICD-10-CM

## 2015-04-01 NOTE — Progress Notes (Signed)
Joe Reddish, MD Phone: [phone]  Subjective:  Patient presents today for their annual wellness visit.    Preventive Screening-Counseling & Management  Smoking Status: current Smoker slightly less than a pack a day, advised cessation- not interested Second Hand Smoking status: No smokers in home  Risk Factors Regular exercise: very active at work, no regular exercise outside Diet: reasonable  Fall Risk: None   Cardiac risk factors:  advanced age (older than 65 for men, 42 for women)  Hyperlipidemia - low HDL but otherwise good No diabetes.  Family History: none Current smoker   Depression Screen None. PHQ2 0 o  Activities of Daily Living Independent ADLs and IADLs   Hearing Difficulties: -patient declines  Cognitive Testing No reported trouble.   Normal 3 word recall   List the Names of Other Physician/Practitioners you currently use: -Dr. Sherlean Foot optho -Dr. Zigmund Daniel- retina specialist  Immunization History  Administered Date(s) Administered  . Influenza Whole 03/23/2005  . Pneumococcal Polysaccharide-23 05/02/2007  . Td 05/02/2007   Required Immunizations needed today: declines prevnar  Screening tests- up to date Health Maintenance Due  Topic Date Due  . ZOSTAVAX - declines 02/17/2000  . COLONOSCOPY - was supposed to have 3 year follow up, referred 09/2014 did not go, refer again today and reinforced 10/09/2014    ROS- No pertinent positives discovered in course of AWV No chest pain. Still with some shortness of breath with mowing lawn but not worsening in years. Very active at work  The following were reviewed and entered/updated in epic: Past Medical History  Diagnosis Date  . 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 COLONIC POLYPS, HX OF 10/11/2006  . COPD 03/31/2009  . EMPHYSEMA 10/16/2009  . GERD 09/09/2009  . HEMOPTYSIS UNSPECIFIED 07/30/2009  . HYPERGLYCEMIA 08/12/2009  . HYPERTENSION 08/04/2007  . 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  TOBACCO USE 01/30/2007   Patient Active Problem List   Diagnosis Date Noted  . EMPHYSEMA 10/16/2009    Priority: High  . TOBACCO USE 01/30/2007    Priority: High  . 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 Hyperglycemia 08/12/2009    Priority: Medium  . Essential hypertension 08/04/2007    Priority: Medium  . Abnormal CT scan, chest 09/07/2010    Priority: Low  . GERD 09/09/2009    Priority: Low  . 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/11/2006    Priority: Low   Past Surgical History  Procedure Laterality Date  . Appendectomy    . Prostate surgery      prostatectomy  . Cataract extraction      Family History  Problem Relation Age of Onset  . Hyperlipidemia Mother   . Cancer Father     lung, smoker  . Cancer Sister     breast, smoker  . Arthritis Maternal Aunt     Medications- reviewed and updated Current Outpatient Prescriptions  Medication Sig Dispense Refill  . amLODipine (NORVASC) 5 MG tablet TAKE 1 TABLET (5 MG TOTAL) BY MOUTH DAILY. 90 tablet 1  . ibuprofen (ADVIL,MOTRIN) 200 MG tablet Take 200 mg by mouth every 6 (six) hours as needed (for pain).      No current facility-administered medications for this visit.    Allergies-reviewed and updated Allergies  Allergen Reactions  . Penicillins Hives    Social History   Social History  . Marital Status: Single    Spouse Name: N/A  . Number of Children: N/A  . Years of Education: N/A  Social History Main Topics  . Smoking status: Current Every Day Smoker -- 0.50 packs/day    Types: Cigarettes  . Smokeless tobacco: Never Used  . Alcohol Use: No  . Drug Use: No  . Sexual Activity: Not Asked   Other Topics Concern  . None   Social History Narrative   Divorced. 2 sons. 5 grandkids.       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       HObbies: woodworking, taking care of yard, very active at work    Objective: BP  118/70 mmHg  Pulse 64  Temp(Src) 97.7 F (36.5 C)  Ht '5\' 10"'$  (1.778 m)  Wt 207 lb (93.895 kg)  BMI 29.70 kg/m2 Gen: NAD, resting comfortably HEENT: Mucous membranes are moist. Oropharynx normal Neck: no thyromegaly CV: RRR no murmurs rubs or gallops Lungs: CTAB no crackles, wheeze, rhonchi Abdomen: soft/nontender/nondistended/normal bowel sounds.  Ext: no edema, 2+ PT pulse Skin: warm, dry Neuro: grossly normal, moves all extremities, PERRLA  Assessment/Plan:  AWV completed  Tobacco abuse- counseled to quit smoking, not ready Lung cancer screening- opts out but will continue to consider Opts out of Prevnar Denies worsening shortness of breath- stable for years despite known empysema and smoking Hyperglycemia- a1c was normal- will only check fasting CBG next visit Hypertension controlled on amlodipine alone on repeat Referred back for colonoscopy  Return in about 6 months (around 10/02/2015) for follow up- also schedule visit for labs a few days before.  Future fasting Orders Placed This Encounter  Procedures  . CBC    Standing Status: Future     Number of Occurrences:      Standing Expiration Date: 03/31/2016  . Comprehensive metabolic panel    [place]    Standing Status: Future     Number of Occurrences:      Standing Expiration Date: 03/31/2016  . Lipid panel    Standing Status: Future     Number of Occurrences:      Standing Expiration Date: 03/31/2016  . PSA    Standing Status: Future     Number of Occurrences:      Standing Expiration Date: 03/31/2016  . Ambulatory referral to Gastroenterology    Referral Priority:  Routine    Referral Type:  Consultation    Referral Reason:  Specialty Services Required    Number of Visits Requested:  1  . POCT Urinalysis Dipstick (Automated)

## 2015-04-01 NOTE — Patient Instructions (Addendum)
Mr. Henk , Thank you for taking time to come for your Medicare Wellness Visit. I appreciate your ongoing commitment to your health goals. Please review the following plan we discussed and let me know if I can assist you in the future.   ____________________________________________________ These are the goals we discussed: 1.  [facility] will call you to schedule your repeat colonoscopy. Please update your colonoscopy 2. Continue to consider quitting smoking- one of best things you can do for your health 3. Consider Prevnar (final pneumonia shot) 4. Consider lung cancer screening program 5. Return for fasting labs before your next visit ______________________________________________________     This is a list of the screening recommended for you and due dates:  Health Maintenance  Topic Date Due  . Colon Cancer Screening  10/09/2014  . Flu Shot  10/01/2024*  . Pneumonia vaccines (2 of 2 - PCV13) 10/01/2024*  . Shingles Vaccine  05/10/2035*  . Tetanus Vaccine  05/01/2017  *Topic was postponed. The date shown is not the original due date.

## 2015-08-29 ENCOUNTER — Other Ambulatory Visit: Payer: Self-pay | Admitting: Family Medicine

## 2015-10-02 ENCOUNTER — Ambulatory Visit (INDEPENDENT_AMBULATORY_CARE_PROVIDER_SITE_OTHER): Payer: Medicare Other | Admitting: Family Medicine

## 2015-10-02 ENCOUNTER — Encounter: Payer: Self-pay | Admitting: Family Medicine

## 2015-10-02 VITALS — BP 136/72 | HR 60 | Temp 97.8°F | Wt 208.0 lb

## 2015-10-02 DIAGNOSIS — D126 Benign neoplasm of colon, unspecified: Secondary | ICD-10-CM | POA: Diagnosis not present

## 2015-10-02 DIAGNOSIS — J438 Other emphysema: Secondary | ICD-10-CM

## 2015-10-02 DIAGNOSIS — F17200 Nicotine dependence, unspecified, uncomplicated: Secondary | ICD-10-CM

## 2015-10-02 DIAGNOSIS — Z8546 Personal history of malignant neoplasm of prostate: Secondary | ICD-10-CM

## 2015-10-02 DIAGNOSIS — I10 Essential (primary) hypertension: Secondary | ICD-10-CM | POA: Diagnosis not present

## 2015-10-02 NOTE — Progress Notes (Signed)
Subjective:  Joe Hill is a 75 y.o. year old very pleasant male patient who presents for/with See problem oriented charting ROS- No chest pain. No headache or blurry vision. see any ROS included in HPI as well.   Past Medical History-  Patient Active Problem List   Diagnosis Date Noted  . EMPHYSEMA 10/16/2009    Priority: High  . TOBACCO USE 01/30/2007    Priority: High  . 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 Hyperglycemia 08/12/2009    Priority: Medium  . Essential hypertension 08/04/2007    Priority: Medium  . Abnormal CT scan, chest 09/07/2010    Priority: Low  . GERD 09/09/2009    Priority: Low  . 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 amLODipine (NORVASC) 5 MG tablet TAKE 1 TABLET (5 MG TOTAL) BY MOUTH DAILY. 90 tablet 1  . ibuprofen (ADVIL,MOTRIN) 200 MG tablet Take 200 mg by mouth every 6 (six) hours as needed (for pain).      No current facility-administered medications for this visit.     Objective: BP 136/72 (BP Location: Left Arm, Patient Position: Sitting, Cuff Size: Normal)   Pulse 60   Temp 97.8 F (36.6 C) (Oral)   Wt 208 lb (94.3 kg)   SpO2 96%   BMI 29.84 kg/m  Gen: NAD, resting comfortably, smells of smoke CV: RRR no murmurs rubs or gallops Lungs: CTAB no crackles, wheeze, rhonchi Abdomen: soft/nontender/nondistended/normal bowel sounds.overweight Ext: no edema Skin: warm, dry Neuro: grossly normal, moves all extremities  Assessment/Plan:  Hypertension S: controlled on amlodipine '5mg'$  BP Readings from Last 3 Encounters:  10/02/15 136/72  04/01/15 118/70  10/02/14 122/74  A/P:Continue current meds:  Doing well  TOBACCO USE Remains uninterested in lung cancer screening program. Declines cessation despite my advice to quit.   EMPHYSEMA S: no worsening SOB, no difficulty with stairs still. Rests a few  times with mowing yard- no change A/P: he does not have any wheezing episodes- he declines inhaler- but we discussed he can call in for prn albuterol if needed in future   Adenomatous colon polyp S: needs 5 year repeat colonsocopy- did not return GI call last time A/P: states he does not have someone to wait with him right now- does agree to stool cards and if positive- would agree to work harder to find help with someone to sit with him   6 months follow up  Orders Placed This Encounter  Procedures  . PSA    Standing Status:   Future    Standing Expiration Date:   10/01/2016  . Comprehensive metabolic panel    [place]    Standing Status:   Future    Standing Expiration Date:   10/01/2016  . CBC    Standing Status:   Future    Standing Expiration Date:   10/01/2016  . Lipid panel    Standing Status:   Future    Standing Expiration Date:   10/01/2016  . POC Hemoccult Bld/Stl (3-Cd Home Screen)    Send home    Standing Status:   Future    Standing Expiration Date:   10/01/2016  . POCT Urinalysis Dipstick (Automated)    Standing Status:   Future    Standing Expiration Date:   10/01/2016   Declines all immunizations  The duration of face-to-face time during this visit was  greater than 25 minutes. Greater than 50% of this time was spent in counseling, explanation of diagnosis, planning of further management, and/or coordination of care including counseling on risks of continued smoking, risks for colon cancer, risks for lung cancer, importance of regular BP checks, importance of returning for labs- he would rather wait until next visit but I advised at this time, importance of immunizations.   Return precautions advised.  Garret Reddish, MD

## 2015-10-02 NOTE — Assessment & Plan Note (Signed)
S: needs 5 year repeat colonsocopy- did not return GI call last time A/P: states he does not have someone to wait with him right now- does agree to stool cards and if positive- would agree to work harder to find help with someone to sit with him

## 2015-10-02 NOTE — Progress Notes (Signed)
Pre visit review using our clinic review tool, if applicable. No additional management support is needed unless otherwise documented below in the visit note. 

## 2015-10-02 NOTE — Patient Instructions (Signed)
Stop by lab to get stool cards- return them to [place]. This will give [place] some information but we still need to get around to colonoscopy as able  Schedule a lab visit at the check out desk within 2 weeks. Return for future fasting labs meaning nothing but water after midnight please. Ok to take your medications with water.   Return to see [place] in 6 months

## 2015-10-02 NOTE — Assessment & Plan Note (Signed)
S: no worsening SOB, no difficulty with stairs still. Rests a few times with mowing yard- no change A/P: he does not have any wheezing episodes- he declines inhaler- but we discussed he can call in for prn albuterol if needed in future

## 2015-10-02 NOTE — Assessment & Plan Note (Signed)
Remains uninterested in lung cancer screening program. Declines cessation despite my advice to quit.

## 2015-10-16 ENCOUNTER — Other Ambulatory Visit (INDEPENDENT_AMBULATORY_CARE_PROVIDER_SITE_OTHER): Payer: Medicare Other

## 2015-10-16 DIAGNOSIS — I10 Essential (primary) hypertension: Secondary | ICD-10-CM | POA: Diagnosis not present

## 2015-10-16 DIAGNOSIS — Z8546 Personal history of malignant neoplasm of prostate: Secondary | ICD-10-CM

## 2015-10-16 LAB — COMPREHENSIVE METABOLIC PANEL
ALBUMIN: 4.1 g/dL (ref 3.5–5.2)
ALK PHOS: 91 U/L (ref 39–117)
ALT: 10 U/L (ref 0–53)
AST: 12 U/L (ref 0–37)
BILIRUBIN TOTAL: 0.8 mg/dL (ref 0.2–1.2)
BUN: 18 mg/dL (ref 6–23)
CO2: 31 mEq/L (ref 19–32)
Calcium: 8.7 mg/dL (ref 8.4–10.5)
Chloride: 106 mEq/L (ref 96–112)
Creatinine, Ser: 1.02 mg/dL (ref 0.40–1.50)
GFR: 75.54 mL/min (ref >60.00)
Glucose, Bld: 110 mg/dL — ABNORMAL HIGH (ref 70–99)
POTASSIUM: 3.6 meq/L (ref 3.5–5.1)
SODIUM: 142 meq/L (ref 135–145)
TOTAL PROTEIN: 6.8 g/dL (ref 6.0–8.3)

## 2015-10-16 LAB — LIPID PANEL
CHOLESTEROL: 119 mg/dL (ref 0–200)
HDL: 35.1 mg/dL — AB (ref >39.00)
LDL Cholesterol: 74 mg/dL (ref 0–99)
NONHDL: 83.87
TRIGLYCERIDES: 49 mg/dL (ref 0.0–149.0)
Total CHOL/HDL Ratio: 3
VLDL: 9.8 mg/dL (ref 0.0–40.0)

## 2015-10-16 LAB — CBC
HCT: 44.4 % (ref 39.0–52.0)
HEMOGLOBIN: 15.5 g/dL (ref 13.0–17.0)
MCHC: 35 g/dL (ref 30.0–36.0)
MCV: 94.1 fl (ref 78.0–100.0)
Platelets: 247 10*3/uL (ref 150.0–400.0)
RBC: 4.71 Mil/uL (ref 4.22–5.81)
RDW: 13.5 % (ref 11.5–15.5)
WBC: 7.8 10*3/uL (ref 4.0–10.5)

## 2015-10-16 LAB — PSA: PSA: 0 ng/mL — ABNORMAL LOW (ref 0.10–4.00)

## 2016-02-23 ENCOUNTER — Other Ambulatory Visit: Payer: Self-pay | Admitting: Family Medicine

## 2016-03-31 ENCOUNTER — Ambulatory Visit: Payer: Medicare Other | Admitting: Family Medicine

## 2016-04-06 ENCOUNTER — Telehealth: Payer: Self-pay | Admitting: Family Medicine

## 2016-04-07 ENCOUNTER — Encounter: Payer: Self-pay | Admitting: Family Medicine

## 2016-04-07 ENCOUNTER — Ambulatory Visit (INDEPENDENT_AMBULATORY_CARE_PROVIDER_SITE_OTHER): Payer: Medicare Other | Admitting: Family Medicine

## 2016-04-07 DIAGNOSIS — D126 Benign neoplasm of colon, unspecified: Secondary | ICD-10-CM | POA: Diagnosis not present

## 2016-04-07 DIAGNOSIS — F17200 Nicotine dependence, unspecified, uncomplicated: Secondary | ICD-10-CM | POA: Diagnosis not present

## 2016-04-07 DIAGNOSIS — R739 Hyperglycemia, unspecified: Secondary | ICD-10-CM | POA: Diagnosis not present

## 2016-04-07 DIAGNOSIS — I10 Essential (primary) hypertension: Secondary | ICD-10-CM

## 2016-04-07 DIAGNOSIS — J438 Other emphysema: Secondary | ICD-10-CM | POA: Diagnosis not present

## 2016-04-07 NOTE — Assessment & Plan Note (Signed)
S: Smoking 1 pack per 1.5 days.  offered lung cancer screening again and declined again. Declines cessation as well.  A/P: advised to quit as always. He states may consider if retires again- feels like he needs the smoking for travel

## 2016-04-07 NOTE — Progress Notes (Signed)
Pre visit review using our clinic review tool, if applicable. No additional management support is needed unless otherwise documented below in the visit note. 

## 2016-04-07 NOTE — Assessment & Plan Note (Signed)
S: needs colonoscopy. Does not have someone to sit with him. Did not bring back stool card slast visit A/P: offered colonoscopy again- he opts for stool cards given above mentioned reason. He is going to actively look for someone to sit with him for colonoscopy

## 2016-04-07 NOTE — Progress Notes (Signed)
Subjective:  Joe Hill is a 76 y.o. year old very pleasant male patient who presents for/with See problem oriented charting ROS- no chest pain. Some shortness of breath but not worsening. No headaches or blurry vision.    Past Medical History-  Patient Active Problem List   Diagnosis Date Noted  . EMPHYSEMA 10/16/2009    Priority: High  . TOBACCO USE 01/30/2007    Priority: High  . 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 Hyperglycemia 08/12/2009    Priority: Medium  . Essential hypertension 08/04/2007    Priority: Medium  . Abnormal CT scan, chest 09/07/2010    Priority: Low  . GERD 09/09/2009    Priority: Low  . 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 amLODipine (NORVASC) 5 MG tablet TAKE 1 TABLET (5 MG TOTAL) BY MOUTH DAILY. 90 tablet 1  . ibuprofen (ADVIL,MOTRIN) 200 MG tablet Take 200 mg by mouth every 6 (six) hours as needed (for pain).      No current facility-administered medications for this visit.     Objective: BP 128/62 (BP Location: Left Arm, Patient Position: Sitting, Cuff Size: Large)   Pulse 61   Temp 97.9 F (36.6 C) (Oral)   Ht '5\' 10"'$  (1.778 m)   Wt 211 lb 3.2 oz (95.8 kg)   SpO2 97%   BMI 30.30 kg/m  Gen: NAD, resting comfortably CV: RRR no murmurs rubs or gallops Lungs: CTAB no crackles, wheeze, rhonchi Ext: no edema Skin: warm, dry, no rash  Assessment/Plan:  Adenomatous colon polyp S: needs colonoscopy. Does not have someone to sit with him. Did not bring back stool card slast visit A/P: offered colonoscopy again- he opts for stool cards given above mentioned reason. He is going to actively look for someone to sit with him for colonoscopy  Essential hypertension S: controlled on amlodipine '5mg'$  alone.  BP Readings from Last 3 Encounters:  04/07/16 128/62  10/02/15 136/72  04/01/15 118/70  A/P:Continue  current medications  TOBACCO USE S: Smoking 1 pack per 1.5 days.  offered lung cancer screening again and declined again. Declines cessation as well.  A/P: advised to quit as always. He states may consider if retires again- feels like he needs the smoking for travel  EMPHYSEMA S: no worsening SOB reported. Rests when mowing yard or doing other harder labor. He thinks this is due to age and not copd. A/P: once again offered prn albuterol and declines. Discussed smoking cessation  Hyperglycemia S: fasting sugar was 110.  Wt Readings from Last 3 Encounters:  04/07/16 211 lb 3.2 oz (95.8 kg)  10/02/15 208 lb (94.3 kg)  04/01/15 207 lb (93.9 kg)  A/P: weight up 3 lbs- Encouraged need for healthy eating, regular exercise, weight loss.   Return in about 6 months (around 10/08/2016) for follow up- or sooner if needed. Declines awv   Orders Placed This Encounter  Procedures  . POC Hemoccult Bld/Stl (3-Cd Home Screen)    Send home    Standing Status:   Future    Standing Expiration Date:   04/07/2017   Return precautions advised.  Garret Reddish, MD

## 2016-04-07 NOTE — Assessment & Plan Note (Signed)
S: no worsening SOB reported. Rests when mowing yard or doing other harder labor. He thinks this is due to age and not copd. A/P: once again offered prn albuterol and declines. Discussed smoking cessation

## 2016-04-07 NOTE — Patient Instructions (Addendum)
Please stop by lab before you go to pick up stool cards. Have to bring these back to [place]  No changes today otherwise

## 2016-04-07 NOTE — Assessment & Plan Note (Signed)
S: controlled on amlodipine '5mg'$  alone.  BP Readings from Last 3 Encounters:  04/07/16 128/62  10/02/15 136/72  04/01/15 118/70  A/P:Continue current medications

## 2016-04-07 NOTE — Assessment & Plan Note (Signed)
S: fasting sugar was 110.  Wt Readings from Last 3 Encounters:  04/07/16 211 lb 3.2 oz (95.8 kg)  10/02/15 208 lb (94.3 kg)  04/01/15 207 lb (93.9 kg)  A/P: weight up 3 lbs- Encouraged need for healthy eating, regular exercise, weight loss.

## 2016-06-08 NOTE — Telephone Encounter (Signed)
Called to see if pt wanted to schedule awv - left message.  

## 2016-08-20 ENCOUNTER — Other Ambulatory Visit: Payer: Self-pay | Admitting: Family Medicine

## 2016-08-27 ENCOUNTER — Other Ambulatory Visit: Payer: Self-pay | Admitting: Family Medicine

## 2016-10-07 ENCOUNTER — Ambulatory Visit (INDEPENDENT_AMBULATORY_CARE_PROVIDER_SITE_OTHER): Payer: Medicare Other | Admitting: Family Medicine

## 2016-10-07 ENCOUNTER — Encounter: Payer: Self-pay | Admitting: Family Medicine

## 2016-10-07 VITALS — BP 118/66 | HR 54 | Temp 98.0°F | Ht 70.0 in | Wt 206.4 lb

## 2016-10-07 DIAGNOSIS — J438 Other emphysema: Secondary | ICD-10-CM

## 2016-10-07 DIAGNOSIS — F17200 Nicotine dependence, unspecified, uncomplicated: Secondary | ICD-10-CM | POA: Diagnosis not present

## 2016-10-07 DIAGNOSIS — Z8546 Personal history of malignant neoplasm of prostate: Secondary | ICD-10-CM | POA: Diagnosis not present

## 2016-10-07 DIAGNOSIS — D126 Benign neoplasm of colon, unspecified: Secondary | ICD-10-CM

## 2016-10-07 DIAGNOSIS — I10 Essential (primary) hypertension: Secondary | ICD-10-CM | POA: Diagnosis not present

## 2016-10-07 DIAGNOSIS — R739 Hyperglycemia, unspecified: Secondary | ICD-10-CM | POA: Diagnosis not present

## 2016-10-07 NOTE — Assessment & Plan Note (Signed)
Adenomatous colon polyp history but does not have someone to sit with him. Asked for stool cares last visit which  (He never brought them back)- discussed this is not truly adequate given polyp history. He is going to work hard on this and call me once he finds someone to help- he states has a friend that is close to helping him (he will sit for hers and she will sit for his). Discussed colon cancer risks. He wants to stop as over age 76 but with polyp history I advised minimum at least one more and now has bene 7 years.

## 2016-10-07 NOTE — Assessment & Plan Note (Signed)
s:  smoking 1 pack per 1.5 days or less. Declines lung cancer screening and cessation  A/P: advised cessation- not ready. Considering if he retires again.

## 2016-10-07 NOTE — Assessment & Plan Note (Signed)
Released from urology. 2008 treatment.  Lab Results  Component Value Date   PSA 0.00 (L) 10/16/2015   PSA 0.01 (L) 10/02/2014   PSA 4.67 (H) 06/14/2006  update psa with labs

## 2016-10-07 NOTE — Assessment & Plan Note (Signed)
S: luckily no worsening SOB but we discussed risks with continued smoking. Still has to rest when mowing yard or doing other hard labor.  A/P:  does not want to trial albuterol = prefers to stop and rest. Continue to monitor. Advised smoking cessation

## 2016-10-07 NOTE — Patient Instructions (Addendum)
Schedule a lab visit at the check out desk within 2 weeks. Return for future fasting labs meaning nothing but water after midnight please. Ok to take your medications with water.   As always encourage you to quit smoking  Please call me when you have someone to sit with you for colonoscopy- usually takes at least a month to get in for the colonoscopy after order placed  I would also like for you to sign up for an annual wellness visit with our nurse, Manuela Schwartz, who specializes in the annual wellness exam. This is a free benefit under medicare that may help [place] find additional ways to help you. Some highlights are reviewing medications, lifestyle, and doing a dementia screen.

## 2016-10-07 NOTE — Progress Notes (Signed)
Subjective:  Joe Hill is a 76 y.o. year old very pleasant male patient who presents for/with See problem oriented charting ROS- No chest pain. stable shortness of breath. No headache or blurry vision.     Past Medical History-  Patient Active Problem List   Diagnosis Date Noted  . EMPHYSEMA 10/16/2009    Priority: High  . TOBACCO USE 01/30/2007    Priority: High  . 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 Hyperglycemia 08/12/2009    Priority: Medium  . Essential hypertension 08/04/2007    Priority: Medium  . Abnormal CT scan, chest 09/07/2010    Priority: Low  . GERD 09/09/2009    Priority: Low  . 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 amLODipine (NORVASC) 5 MG tablet TAKE 1 TABLET BY MOUTH EVERY DAY 90 tablet 1  . ibuprofen (ADVIL,MOTRIN) 200 MG tablet Take 200 mg by mouth every 6 (six) hours as needed (for pain).      No current facility-administered medications for this visit.     Objective: BP 118/66 (BP Location: Left Arm, Patient Position: Sitting, Cuff Size: Large)   Pulse (!) 54   Temp 98 F (36.7 C) (Oral)   Ht 5\' 10"  (1.778 m)   Wt 206 lb 6.4 oz (93.6 kg)   SpO2 97%   BMI 29.62 kg/m  Gen: NAD, resting comfortably, smells of smoke CV: RRR no murmurs rubs or gallops Lungs: CTAB no crackles, wheeze, rhonchi Abdomen: soft/nontender/nondistended  Ext: no edema Skin: warm, dry Neuro: grossly normal, moves all extremities, normal gait  Assessment/Plan:  Hypertension S: controlled on amlodipine 5mg  alone.  BP Readings from Last 3 Encounters:  10/07/16 118/66  04/07/16 128/62  10/02/15 136/72  A/P: We discussed blood pressure goal of <140/90. Continue current meds  Adenomatous colon polyp Adenomatous colon polyp history but does not have someone to sit with him. Asked for stool cares last visit which  (He never  brought them back)- discussed this is not truly adequate given polyp history. He is going to work hard on this and call me once he finds someone to help- he states has a friend that is close to helping him (he will sit for hers and she will sit for his). Discussed colon cancer risks. He wants to stop as over age 29 but with polyp history I advised minimum at least one more and now has bene 7 years.   EMPHYSEMA S: luckily no worsening SOB but we discussed risks with continued smoking. Still has to rest when mowing yard or doing other hard labor.  A/P:  does not want to trial albuterol = prefers to stop and rest. Continue to monitor. Advised smoking cessation  Hyperglycemia S: weight down 5 lbs after gaining 3 previously. Last cbg elevated at 110 but lower than 120 2 years ago A/P: update cbg and a1c- luckily a1c has not been elevated  TOBACCO USE s:  smoking 1 pack per 1.5 days or less. Declines lung cancer screening and cessation  A/P: advised cessation- not ready. Considering if he retires again.   PROSTATE CANCER, HX OF Released from urology. 2008 treatment.  Lab Results  Component Value Date   PSA 0.00 (L) 10/16/2015   PSA 0.01 (L) 10/02/2014   PSA 4.67 (H) 06/14/2006  update psa with labs  Declines Flu and prevnar and pneumovax again despite smoking  Return in about 6 months (around 04/06/2017) for follow up- or sooner if needed.  Future fasting Orders Placed This Encounter  Procedures  . CBC    Standing Status:   Future    Standing Expiration Date:   10/07/2017  . Comprehensive metabolic panel    [place]    Standing Status:   Future    Standing Expiration Date:   10/07/2017  . Hemoglobin A1c    [place]    Standing Status:   Future    Standing Expiration Date:   10/07/2017  . Lipid panel    Standing Status:   Future    Standing Expiration Date:   10/07/2017  . PSA    Standing Status:   Future    Standing Expiration Date:   10/07/2017  . Urinalysis    Standing Status:    Future    Standing Expiration Date:   10/07/2017   Return precautions advised.  Garret Reddish, MD

## 2016-10-07 NOTE — Assessment & Plan Note (Signed)
S: weight down 5 lbs after gaining 3 previously. Last cbg elevated at 110 but lower than 120 2 years ago A/P: update cbg and a1c- luckily a1c has not been elevated

## 2016-10-21 ENCOUNTER — Other Ambulatory Visit (INDEPENDENT_AMBULATORY_CARE_PROVIDER_SITE_OTHER): Payer: Medicare Other

## 2016-10-21 DIAGNOSIS — Z8546 Personal history of malignant neoplasm of prostate: Secondary | ICD-10-CM | POA: Diagnosis not present

## 2016-10-21 DIAGNOSIS — I10 Essential (primary) hypertension: Secondary | ICD-10-CM | POA: Diagnosis not present

## 2016-10-21 DIAGNOSIS — R739 Hyperglycemia, unspecified: Secondary | ICD-10-CM | POA: Diagnosis not present

## 2016-10-21 DIAGNOSIS — F17200 Nicotine dependence, unspecified, uncomplicated: Secondary | ICD-10-CM

## 2016-10-21 LAB — COMPREHENSIVE METABOLIC PANEL
ALBUMIN: 4.1 g/dL (ref 3.5–5.2)
ALT: 8 U/L (ref 0–53)
AST: 12 U/L (ref 0–37)
Alkaline Phosphatase: 91 U/L (ref 39–117)
BUN: 12 mg/dL (ref 6–23)
CO2: 31 mEq/L (ref 19–32)
Calcium: 9.1 mg/dL (ref 8.4–10.5)
Chloride: 104 mEq/L (ref 96–112)
Creatinine, Ser: 0.93 mg/dL (ref 0.40–1.50)
GFR: 83.81 mL/min (ref >60.00)
Glucose, Bld: 105 mg/dL — ABNORMAL HIGH (ref 70–99)
Potassium: 3.3 mEq/L — ABNORMAL LOW (ref 3.5–5.1)
Sodium: 141 mEq/L (ref 135–145)
Total Bilirubin: 1 mg/dL (ref 0.2–1.2)
Total Protein: 6.6 g/dL (ref 6.0–8.3)

## 2016-10-21 LAB — LIPID PANEL
CHOLESTEROL: 121 mg/dL (ref 0–200)
HDL: 34.9 mg/dL — AB (ref >39.00)
LDL CALC: 73 mg/dL (ref 0–99)
NonHDL: 85.88
TRIGLYCERIDES: 66 mg/dL (ref 0.0–149.0)
Total CHOL/HDL Ratio: 3
VLDL: 13.2 mg/dL (ref 0.0–40.0)

## 2016-10-21 LAB — CBC
HCT: 44.9 % (ref 39.0–52.0)
HEMOGLOBIN: 15.7 g/dL (ref 13.0–17.0)
MCHC: 35 g/dL (ref 30.0–36.0)
MCV: 94.5 fl (ref 78.0–100.0)
PLATELETS: 243 10*3/uL (ref 150.0–400.0)
RBC: 4.75 Mil/uL (ref 4.22–5.81)
RDW: 13.2 % (ref 11.5–15.5)
WBC: 6.4 10*3/uL (ref 4.0–10.5)

## 2016-10-21 LAB — PSA: PSA: 0.01 ng/mL — AB (ref 0.10–4.00)

## 2016-10-21 LAB — HEMOGLOBIN A1C: HEMOGLOBIN A1C: 5.3 % (ref 4.6–6.5)

## 2016-10-23 ENCOUNTER — Encounter: Payer: Self-pay | Admitting: Family Medicine

## 2016-10-23 DIAGNOSIS — E785 Hyperlipidemia, unspecified: Secondary | ICD-10-CM | POA: Insufficient documentation

## 2016-10-25 ENCOUNTER — Other Ambulatory Visit: Payer: Self-pay

## 2016-10-25 ENCOUNTER — Other Ambulatory Visit (INDEPENDENT_AMBULATORY_CARE_PROVIDER_SITE_OTHER): Payer: Medicare Other

## 2016-10-25 DIAGNOSIS — I10 Essential (primary) hypertension: Secondary | ICD-10-CM | POA: Diagnosis not present

## 2016-10-25 DIAGNOSIS — F17200 Nicotine dependence, unspecified, uncomplicated: Secondary | ICD-10-CM

## 2016-10-25 LAB — URINALYSIS, ROUTINE W REFLEX MICROSCOPIC
Bilirubin Urine: NEGATIVE
Ketones, ur: NEGATIVE
Leukocytes, UA: NEGATIVE
Nitrite: NEGATIVE
PH: 5.5 (ref 5.0–8.0)
Total Protein, Urine: NEGATIVE
Urine Glucose: NEGATIVE
Urobilinogen, UA: 0.2 (ref 0.0–1.0)

## 2016-10-25 MED ORDER — ATORVASTATIN CALCIUM 10 MG PO TABS: 10.0000 mg | ORAL_TABLET | Freq: Every day | ORAL | 3 refills | Status: DC

## 2017-04-06 ENCOUNTER — Ambulatory Visit: Payer: Medicare Other | Admitting: Family Medicine

## 2017-05-17 ENCOUNTER — Ambulatory Visit (INDEPENDENT_AMBULATORY_CARE_PROVIDER_SITE_OTHER): Payer: Medicare Other | Admitting: Family Medicine

## 2017-05-17 ENCOUNTER — Encounter: Payer: Self-pay | Admitting: Family Medicine

## 2017-05-17 VITALS — BP 118/68 | HR 51 | Temp 97.7°F | Ht 70.0 in | Wt 204.2 lb

## 2017-05-17 DIAGNOSIS — F17200 Nicotine dependence, unspecified, uncomplicated: Secondary | ICD-10-CM | POA: Diagnosis not present

## 2017-05-17 DIAGNOSIS — J438 Other emphysema: Secondary | ICD-10-CM

## 2017-05-17 DIAGNOSIS — D126 Benign neoplasm of colon, unspecified: Secondary | ICD-10-CM | POA: Diagnosis not present

## 2017-05-17 DIAGNOSIS — I10 Essential (primary) hypertension: Secondary | ICD-10-CM

## 2017-05-17 DIAGNOSIS — R739 Hyperglycemia, unspecified: Secondary | ICD-10-CM

## 2017-05-17 NOTE — Progress Notes (Signed)
Subjective:  Joe Hill is a 77 y.o. year old very pleasant male patient who presents for/with See problem oriented charting ROS- complains of knee pain, some fatigue with exercise though no calf pain or thigh or hamstring pain. No edema. No chest pain.    Past Medical History-  Patient Active Problem List   Diagnosis Date Noted  . EMPHYSEMA 10/16/2009    Priority: High  . TOBACCO USE 01/30/2007    Priority: High  . 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 Hyperglycemia 08/12/2009    Priority: Medium  . Essential hypertension 08/04/2007    Priority: Medium  . Abnormal CT scan, chest 09/07/2010    Priority: Low  . GERD 09/09/2009    Priority: Low  . 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 Dyslipidemia 10/23/2016    Medications- reviewed and updated Current Outpatient Medications  Medication Sig Dispense Refill  . amLODipine (NORVASC) 5 MG tablet TAKE 1 TABLET BY MOUTH EVERY DAY 90 tablet 1   No current facility-administered medications for this visit.     Objective: BP 118/68 (BP Location: Left Arm, Patient Position: Sitting, Cuff Size: Normal)   Pulse (!) 51   Temp 97.7 F (36.5 C) (Oral)   Ht 5\' 10"  (1.778 m)   Wt 204 lb 3.2 oz (92.6 kg)   SpO2 97%   BMI 29.30 kg/m  Gen: NAD, resting comfortably CV: RRR no murmurs rubs or gallops Lungs: CTAB no crackles, wheeze, rhonchi Abdomen: soft/nontender/nondistended/normal bowel sounds.  Ext: no edema Skin: warm, dry Neuro: normal gait and speech  Assessment/Plan:  Of note does take advil once a day- helps with knee pain. Will monitor creatinine  Could consider EKG at future visit with intermittent bradycardia.   Hypertension S: controlled on amlodipine alone BP Readings from Last 3 Encounters:  05/17/17 118/68  10/07/16 118/66  04/07/16 128/62  A/P: We discussed blood pressure goal of <140/90. Continue current meds  EMPHYSEMA S: no recent wheezing or shortness of breath.  Sometimes fatigues with yardwork- after about an hour but knee pain contributes. No calf pain or thigh pain with this. Is getting to [employer] to walk 2-3x a week.  A/P: declines albuterol once again  Adenomatous colon polyp S: adenomatous colon poly history- has had a hard time finding someone to sit with him A/P: he agrees to ask his son- he will ask within a few weeks about this.   Hyperglycemia S: has lost another 2 lbs. a1c has not been elevated.  cbgs high- a1c ok Lab Results  Component Value Date   HGBA1C 5.3 10/21/2016  A/P: continue healthy lifestyle changes.  We will monitor A1c just once a year at this point  TOBACCO USE S: Last visit he was smoking 1 pack per 1.5 days- 2 days.  He has declined lung cancer screening and cessation of cigarettes.  He also declines flu and Prevnar despite his smoking A/P: not ready for cessation. Contemplating doing prevnar 13 next visit  Future Appointments  Date Time Provider [place]  11/17/2017  8:15 AM [name], MD [facility]   Return in about 6 months (around 11/16/2017) for follow up- come fasting.  Return precautions advised.  Garret Reddish, MD

## 2017-05-17 NOTE — Assessment & Plan Note (Signed)
S: Last visit he was smoking 1 pack per 1.5 days- 2 days.  He has declined lung cancer screening and cessation of cigarettes.  He also declines flu and Prevnar despite his smoking A/P: not ready for cessation. Contemplating doing prevnar 13 next visit

## 2017-05-17 NOTE — Patient Instructions (Addendum)
Health Maintenance Due  Topic Date Due  . COLONOSCOPY - Patient states he will talk to his son about taking him. 10/09/2014  . TETANUS/TDAP - Informed patient to go to his pharmacy and have it do and send [place] the record. 05/01/2017   No labs today- will do next visit. Will monitor kidney function at that time as advil can affect kidneys in long run. Also does slightly increase heart attack and stroke risk.

## 2017-05-17 NOTE — Assessment & Plan Note (Signed)
S: has lost another 2 lbs. a1c has not been elevated.  cbgs high- a1c ok Lab Results  Component Value Date   HGBA1C 5.3 10/21/2016  A/P: continue healthy lifestyle changes.  We will monitor A1c just once a year at this point

## 2017-05-17 NOTE — Assessment & Plan Note (Signed)
S: no recent wheezing or shortness of breath. Sometimes fatigues with yardwork- after about an hour but knee pain contributes. No calf pain or thigh pain with this. Is getting to [employer] to walk 2-3x a week.  A/P: declines albuterol once again

## 2017-05-17 NOTE — Assessment & Plan Note (Signed)
S: adenomatous colon poly history- has had a hard time finding someone to sit with him A/P: he agrees to ask his son- he will ask within a few weeks about this.

## 2017-06-08 ENCOUNTER — Telehealth: Payer: Self-pay

## 2017-06-08 NOTE — Telephone Encounter (Signed)
-----   Message from [name], MD sent at 06/07/2017  9:35 AM EDT ----- Adonis Housekeeper this to phone note please  have team call him about colonoscopy/can son sit with him?

## 2017-06-08 NOTE — Telephone Encounter (Signed)
Called patient and left a voicemail message asking patient to return my phone call.

## 2017-09-05 ENCOUNTER — Other Ambulatory Visit: Payer: Self-pay

## 2017-09-05 MED ORDER — AMLODIPINE BESYLATE 5 MG PO TABS: 5.0000 mg | ORAL_TABLET | Freq: Every day | ORAL | 3 refills | Status: DC

## 2017-11-16 NOTE — Patient Instructions (Addendum)
1.Please stop by lab before you go 2.  Please get your colonoscopy updated once you find some time for son to sit with you 3.  Please consider tetanus shot at the pharmacy.  Can get it here if you have a cut or scrape. 4. I would also like for you to sign up for an annual wellness visit with one of our nurse specialists, Cassie or Manuela Schwartz or Roselyn Reef. This is a free benefit under medicare that may help [place] find additional ways to help you. Some highlights are reviewing medications, lifestyle, and doing a dementia screen.

## 2017-11-16 NOTE — Progress Notes (Signed)
Subjective:  Joe Hill is a 77 y.o. year old very pleasant male patient who presents for/with See problem oriented charting ROS- no chest pain. No edema. Cough hasnt been as bad lately. Some runny nose with weather change.    Past Medical History-  Patient Active Problem List   Diagnosis Date Noted  . EMPHYSEMA 10/16/2009    Priority: High  . TOBACCO USE 01/30/2007    Priority: High  . 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 Dyslipidemia 10/23/2016    Priority: Medium  . Hyperglycemia 08/12/2009    Priority: Medium  . Essential hypertension 08/04/2007    Priority: Medium  . Abnormal CT scan, chest 09/07/2010    Priority: Low  . GERD 09/09/2009    Priority: Low  . 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 amLODipine (NORVASC) 5 MG tablet Take 1 tablet (5 mg total) by mouth daily. 90 tablet 3   No current facility-administered medications for this visit.     Objective: BP 116/68 (BP Location: Left Arm, Patient Position: Sitting, Cuff Size: Large)   Pulse (!) 51   Temp 97.8 F (36.6 C) (Oral)   Ht 5\' 10"  (1.778 m)   Wt 206 lb 9.6 oz (93.7 kg)   SpO2 97%   BMI 29.64 kg/m  Gen: NAD, resting comfortably CV: RRR no murmurs rubs or gallops Lungs: CTAB no crackles, wheeze, rhonchi Abdomen: soft/nontender/nondistended/normal bowel sounds. No rebound or guarding.  Ext: trace edema Skin: warm, dry Neuro: grossly normal, moves all extremities  Assessment/Plan:  Other notes: 1.Continues to decline lung cancer screening, Prevnar 2.  History of adenomatous polyp-still does not have anyone to sit with him for colonoscopy-strongly encouraged again 3.  Takes Advil once a day-continue to monitor creatinine each visit.  4.  History of prostate cancer-update PSA Lab Results  Component Value Date   PSA 0.01 (L) 10/21/2016   PSA 0.00 (L)  10/16/2015   PSA 0.01 (L) 10/02/2014  5. See me back in 6 months 6. High CBGs in past- will get a1c to make sure no diabetes risk though looked good last year  Essential hypertension S: controlled on amlodipine 5 mg alone BP Readings from Last 3 Encounters:  11/17/17 116/68  05/17/17 118/68  10/07/16 118/66  A/P: Stable-at blood pressure goal of <140/90. Continue current meds  Dyslipidemia S: Given long-term smoking, intolerance to aspirin, hypertension- high cardiac risk.  Had suggested atorvastatin 10 mg for daily use September 2018-he stopped taking medication due to stomach upset and muscle cramps.    Lab Results  Component Value Date   CHOL 121 10/21/2016   HDL 34.90 (L) 10/21/2016   LDLCALC 73 10/21/2016   TRIG 66.0 10/21/2016   CHOLHDL 3 10/21/2016   A/P: Update lipid panel today-discussed posisble statin use once again- discussed weekly option rosuvastatin 5mg  if HDL under 50 or LDL over 70  EMPHYSEMA S: Patient continues to smoke about 1/2 pack a day- his plan is to continue to slowly cut down.  Still with some shortness of breath particular with activities like mowing the yard (he states just gets tired) Declines inhaler trial A/P: Stable-continue to monitor -encouraged smoking cessation  6 month follow up  Lab/Order associations: FASTING Dyslipidemia - Plan: CBC, Comprehensive metabolic panel, Lipid panel, Lipid panel, Comprehensive metabolic panel, CBC  Hyperglycemia - Plan: Hemoglobin A1c, Hemoglobin A1c  History  of prostate cancer - Plan: PSA, PSA  TOBACCO USE - Plan: Urinalysis  Essential hypertension  EMPHYSEMA  Return precautions advised.  Garret Reddish, MD

## 2017-11-17 ENCOUNTER — Encounter: Payer: Self-pay | Admitting: Family Medicine

## 2017-11-17 ENCOUNTER — Ambulatory Visit (INDEPENDENT_AMBULATORY_CARE_PROVIDER_SITE_OTHER): Payer: Medicare Other | Admitting: Family Medicine

## 2017-11-17 VITALS — BP 116/68 | HR 51 | Temp 97.8°F | Ht 70.0 in | Wt 206.6 lb

## 2017-11-17 DIAGNOSIS — Z8546 Personal history of malignant neoplasm of prostate: Secondary | ICD-10-CM | POA: Diagnosis not present

## 2017-11-17 DIAGNOSIS — J438 Other emphysema: Secondary | ICD-10-CM

## 2017-11-17 DIAGNOSIS — E785 Hyperlipidemia, unspecified: Secondary | ICD-10-CM | POA: Diagnosis not present

## 2017-11-17 DIAGNOSIS — I10 Essential (primary) hypertension: Secondary | ICD-10-CM | POA: Diagnosis not present

## 2017-11-17 DIAGNOSIS — F17200 Nicotine dependence, unspecified, uncomplicated: Secondary | ICD-10-CM | POA: Diagnosis not present

## 2017-11-17 DIAGNOSIS — R739 Hyperglycemia, unspecified: Secondary | ICD-10-CM

## 2017-11-17 LAB — COMPREHENSIVE METABOLIC PANEL
ALT: 13 U/L (ref 0–53)
AST: 12 U/L (ref 0–37)
Albumin: 4.5 g/dL (ref 3.5–5.2)
Alkaline Phosphatase: 109 U/L (ref 39–117)
BUN: 12 mg/dL (ref 6–23)
CALCIUM: 9.2 mg/dL (ref 8.4–10.5)
CHLORIDE: 103 meq/L (ref 96–112)
CO2: 31 meq/L (ref 19–32)
Creatinine, Ser: 1 mg/dL (ref 0.40–1.50)
GFR: 76.86 mL/min (ref >60.00)
Glucose, Bld: 120 mg/dL — ABNORMAL HIGH (ref 70–99)
POTASSIUM: 3.9 meq/L (ref 3.5–5.1)
Sodium: 140 mEq/L (ref 135–145)
Total Bilirubin: 0.8 mg/dL (ref 0.2–1.2)
Total Protein: 7.1 g/dL (ref 6.0–8.3)

## 2017-11-17 LAB — LIPID PANEL
Cholesterol: 126 mg/dL (ref 0–200)
HDL: 37 mg/dL — AB (ref >39.00)
LDL CALC: 75 mg/dL (ref 0–99)
NonHDL: 89.45
TRIGLYCERIDES: 70 mg/dL (ref 0.0–149.0)
Total CHOL/HDL Ratio: 3
VLDL: 14 mg/dL (ref 0.0–40.0)

## 2017-11-17 LAB — CBC
HEMATOCRIT: 47.1 % (ref 39.0–52.0)
HEMOGLOBIN: 16.4 g/dL (ref 13.0–17.0)
MCHC: 34.8 g/dL (ref 30.0–36.0)
MCV: 94.4 fl (ref 78.0–100.0)
PLATELETS: 259 10*3/uL (ref 150.0–400.0)
RBC: 4.99 Mil/uL (ref 4.22–5.81)
RDW: 13.4 % (ref 11.5–15.5)
WBC: 8.7 10*3/uL (ref 4.0–10.5)

## 2017-11-17 LAB — URINALYSIS
Bilirubin Urine: NEGATIVE
Hgb urine dipstick: NEGATIVE
Ketones, ur: NEGATIVE
Leukocytes, UA: NEGATIVE
NITRITE: NEGATIVE
PH: 7 (ref 5.0–8.0)
Specific Gravity, Urine: 1.015 (ref 1.000–1.030)
TOTAL PROTEIN, URINE-UPE24: NEGATIVE
Urine Glucose: NEGATIVE
Urobilinogen, UA: 0.2 (ref 0.0–1.0)

## 2017-11-17 LAB — PSA: PSA: 0.01 ng/mL — ABNORMAL LOW (ref 0.10–4.00)

## 2017-11-17 LAB — HEMOGLOBIN A1C: Hgb A1c MFr Bld: 5.4 % (ref 4.6–6.5)

## 2017-11-17 NOTE — Assessment & Plan Note (Signed)
S: Patient continues to smoke about 1/2 pack a day- his plan is to continue to slowly cut down.  Still with some shortness of breath particular with activities like mowing the yard (he states just gets tired) Declines inhaler trial A/P: Stable-continue to monitor -encouraged smoking cessation

## 2017-11-17 NOTE — Assessment & Plan Note (Signed)
S: Given long-term smoking, intolerance to aspirin, hypertension- high cardiac risk.  Had suggested atorvastatin 10 mg for daily use September 2018-he stopped taking medication due to stomach upset and muscle cramps.    Lab Results  Component Value Date   CHOL 121 10/21/2016   HDL 34.90 (L) 10/21/2016   LDLCALC 73 10/21/2016   TRIG 66.0 10/21/2016   CHOLHDL 3 10/21/2016   A/P: Update lipid panel today-discussed posisble statin use once again- discussed weekly option rosuvastatin 5mg  if HDL under 50 or LDL over 70

## 2017-11-17 NOTE — Addendum Note (Signed)
Addended by: Kayren Eaves T on: 11/17/2017 08:34 AM   Modules accepted: Orders

## 2017-11-17 NOTE — Assessment & Plan Note (Signed)
S: controlled on amlodipine 5 mg alone BP Readings from Last 3 Encounters:  11/17/17 116/68  05/17/17 118/68  10/07/16 118/66  A/P: Stable-at blood pressure goal of <140/90. Continue current meds

## 2017-11-18 ENCOUNTER — Other Ambulatory Visit: Payer: Self-pay

## 2017-11-18 MED ORDER — ROSUVASTATIN CALCIUM 5 MG PO TABS: 5.0000 mg | ORAL_TABLET | ORAL | 3 refills | Status: DC

## 2018-05-19 ENCOUNTER — Ambulatory Visit: Payer: Medicare Other | Admitting: Family Medicine

## 2018-07-05 ENCOUNTER — Ambulatory Visit: Payer: Medicare Other | Admitting: Family Medicine

## 2018-08-24 ENCOUNTER — Other Ambulatory Visit: Payer: Self-pay

## 2018-08-24 DIAGNOSIS — R6889 Other general symptoms and signs: Secondary | ICD-10-CM | POA: Diagnosis not present

## 2018-08-24 DIAGNOSIS — Z20822 Contact with and (suspected) exposure to covid-19: Secondary | ICD-10-CM

## 2018-08-26 LAB — NOVEL CORONAVIRUS, NAA: SARS-CoV-2, NAA: NOT DETECTED

## 2018-08-27 ENCOUNTER — Encounter: Payer: Self-pay | Admitting: Family Medicine

## 2018-08-28 ENCOUNTER — Telehealth: Payer: Self-pay

## 2018-08-28 NOTE — Telephone Encounter (Signed)
Pt. Called to request copy of COVID result be emailed to him.  Advised it is available to view in [place].  Pt. Reported he can't print copy from [place].  Offered to mail to home address, as we are not able to email results.  Pt. Agreed with plan.  Prepared report of COVID result to be mailed to home address.

## 2018-08-29 ENCOUNTER — Other Ambulatory Visit: Payer: Self-pay | Admitting: Family Medicine

## 2018-10-10 ENCOUNTER — Ambulatory Visit (INDEPENDENT_AMBULATORY_CARE_PROVIDER_SITE_OTHER): Payer: Medicare Other | Admitting: Family Medicine

## 2018-10-10 ENCOUNTER — Other Ambulatory Visit: Payer: Self-pay

## 2018-10-10 ENCOUNTER — Encounter: Payer: Self-pay | Admitting: Family Medicine

## 2018-10-10 VITALS — BP 138/80 | HR 56 | Temp 98.3°F | Ht 70.0 in | Wt 205.0 lb

## 2018-10-10 DIAGNOSIS — J438 Other emphysema: Secondary | ICD-10-CM | POA: Diagnosis not present

## 2018-10-10 DIAGNOSIS — F17200 Nicotine dependence, unspecified, uncomplicated: Secondary | ICD-10-CM | POA: Diagnosis not present

## 2018-10-10 DIAGNOSIS — R739 Hyperglycemia, unspecified: Secondary | ICD-10-CM

## 2018-10-10 DIAGNOSIS — E785 Hyperlipidemia, unspecified: Secondary | ICD-10-CM | POA: Diagnosis not present

## 2018-10-10 DIAGNOSIS — I10 Essential (primary) hypertension: Secondary | ICD-10-CM

## 2018-10-10 DIAGNOSIS — Z8546 Personal history of malignant neoplasm of prostate: Secondary | ICD-10-CM | POA: Diagnosis not present

## 2018-10-10 LAB — CBC
HCT: 44.7 % (ref 39.0–52.0)
Hemoglobin: 15.7 g/dL (ref 13.0–17.0)
MCHC: 35.1 g/dL (ref 30.0–36.0)
MCV: 93.1 fl (ref 78.0–100.0)
Platelets: 225 10*3/uL (ref 150.0–400.0)
RBC: 4.8 Mil/uL (ref 4.22–5.81)
RDW: 13.3 % (ref 11.5–15.5)
WBC: 6.4 10*3/uL (ref 4.0–10.5)

## 2018-10-10 LAB — COMPREHENSIVE METABOLIC PANEL
ALT: 10 U/L (ref 0–53)
AST: 14 U/L (ref 0–37)
Albumin: 4.1 g/dL (ref 3.5–5.2)
Alkaline Phosphatase: 100 U/L (ref 39–117)
BUN: 14 mg/dL (ref 6–23)
CO2: 31 mEq/L (ref 19–32)
Calcium: 9.3 mg/dL (ref 8.4–10.5)
Chloride: 101 mEq/L (ref 96–112)
Creatinine, Ser: 0.92 mg/dL (ref 0.40–1.50)
GFR: 79.43 mL/min (ref >60.00)
Glucose, Bld: 103 mg/dL — ABNORMAL HIGH (ref 70–99)
Potassium: 3.5 mEq/L (ref 3.5–5.1)
Sodium: 138 mEq/L (ref 135–145)
Total Bilirubin: 0.8 mg/dL (ref 0.2–1.2)
Total Protein: 6.7 g/dL (ref 6.0–8.3)

## 2018-10-10 LAB — LIPID PANEL
Cholesterol: 115 mg/dL (ref 0–200)
HDL: 33.8 mg/dL — ABNORMAL LOW (ref >39.00)
LDL Cholesterol: 65 mg/dL (ref 0–99)
NonHDL: 81.23
Total CHOL/HDL Ratio: 3
Triglycerides: 82 mg/dL (ref 0.0–149.0)
VLDL: 16.4 mg/dL (ref 0.0–40.0)

## 2018-10-10 LAB — HEMOGLOBIN A1C: Hgb A1c MFr Bld: 5.6 % (ref 4.6–6.5)

## 2018-10-10 LAB — PSA: PSA: 0.01 ng/mL — ABNORMAL LOW (ref 0.10–4.00)

## 2018-10-10 LAB — POC URINALSYSI DIPSTICK (AUTOMATED)
Bilirubin, UA: NEGATIVE
Blood, UA: POSITIVE
Glucose, UA: NEGATIVE
Ketones, UA: NEGATIVE
Leukocytes, UA: NEGATIVE
Nitrite, UA: NEGATIVE
Protein, UA: NEGATIVE
Spec Grav, UA: 1.025 (ref 1.010–1.025)
Urobilinogen, UA: 0.2 E.U./dL
pH, UA: 6 (ref 5.0–8.0)

## 2018-10-10 NOTE — Patient Instructions (Addendum)
Health Maintenance Due  Topic Date Due  . COLONOSCOPY declines. We discussed history of precancerous polyp- he is going to get with his son to see if he can sit with him now- and let us know if he would like to proceed forward 10/09/2014  . TETANUS/TDAP - check with your pharmacy 05/01/2017  . INFLUENZA VACCINE - declines today, will consider later in flu season (please let us know if you get this at another location so we can update your chart)  08/26/2018   As always- would love for you to quit smoking  Please stop by lab before you go If you do not have mychart- we will call you about results within 5 business days of [place] receiving them.  If you have mychart- we will send your results within 3 business days of [place] receiving them.  If abnormal or we want to clarify a result, we will call or mychart you to make sure you receive the message.  If you have questions or concerns or don't hear within 5-7 days, please send [place] a message or call us.

## 2018-10-10 NOTE — Progress Notes (Signed)
Phone [phone]   Subjective:  Joe Hill is a 78 y.o. year old very pleasant male patient who presents for/with See problem oriented charting Chief Complaint  Patient presents with  . Follow-up    Not fasing today.   . Hypertension  . Hyperglycemia  . Hyperlipidemia   ROS-  Denies HA, dizziness, CP, SOB, visual changes.   Past Medical History-  Patient Active Problem List   Diagnosis Date Noted  . EMPHYSEMA 10/16/2009    Priority: High  . TOBACCO USE 01/30/2007    Priority: High  . 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 Dyslipidemia 10/23/2016    Priority: Medium  . Hyperglycemia 08/12/2009    Priority: Medium  . Essential hypertension 08/04/2007    Priority: Medium  . Abnormal CT scan, chest 09/07/2010    Priority: Low  . GERD 09/09/2009    Priority: Low  . 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 amLODipine (NORVASC) 5 MG tablet TAKE 1 TABLET BY MOUTH EVERY DAY 90 tablet 3  . rosuvastatin (CRESTOR) 5 MG tablet Take 1 tablet (5 mg total) by mouth once a week. (Patient not taking: Reported on 10/10/2018) 13 tablet 3   No current facility-administered medications for this visit.      Objective:  BP 138/80 (BP Location: Left Arm, Patient Position: Sitting, Cuff Size: Normal)   Pulse (!) 56   Temp 98.3 F (36.8 C) (Temporal)   Ht 5\' 10"  (1.778 m)   Wt 205 lb (93 kg)   SpO2 99%   BMI 29.41 kg/m  Gen: NAD, resting comfortably CV: RRR- not bradycardic on my exam- low 60s- no murmurs rubs or gallops Lungs: CTAB no crackles, wheeze, rhonchi Abdomen: soft/nontender/nondistended/normal bowel sounds. No rebound or guarding.  Ext: no edema, 2+ DP pulses Skin: warm, dry Neuro: grossly normal, moves all extremities     Assessment and Plan   #hypertension S: controlled on Amlodipine 5 mg daily. Checking BP at home  occasionally, staying below 140/90- usually 120s to 130s. Trying to follow low sodium diet. Very active at work.  BP Readings from Last 3 Encounters:  10/10/18 138/80  11/17/17 116/68  05/17/17 118/68  A/P: High normal systolic reading today-but has run better at home. Stable- continue current medicine.   #hyperlipidemia S: Took Rosuvastatin in the past, d/c d/t side effects-even low-dose rosuvastatin 5 mg once a week-ideally with smoking history would get LDL below 70 consistently Lab Results  Component Value Date   CHOL 126 11/17/2017   HDL 37.00 (L) 11/17/2017   LDLCALC 75 11/17/2017   TRIG 70.0 11/17/2017   CHOLHDL 3 11/17/2017   A/P: we reviewed ideally LDL under 70- since he has had no history of heart attack or stroke- he would prefer to remain off medicine even if LDL slightly high today. Can continue to work on healthy eating/regular exercise- specifically plant based diet has been shown to be effective for lowering lipids.  - update lipids today  # Hyperglycemia S: Has had elevated CBGs in the past but A1c's have not been elevated-last approximately a year ago  A/P: we opted to just do a cbg today- if normal will continue this, if high- consider a1c again in future  #Tobacco use/emphysema S: down to 0.75 pack per day or less.  Over 30-pack-year smoking-outside of lung cancer screening program.  Patient has emphysema  by PFTs in 2011 and also noted on CT.  Patient still has to stop mowing his yard at times due to heat but doesn't feel short of breath- no issues with stairs A/P: strongly encouraged quitting smoking- one of the best things he can do for his health. Emphysema is stable without medicines- we can send in an inhaler if needed in the future.    # maintaining weight around same range- very active with work and this pushes him. Single- doesn't cook much but trying to make healthier choices- adding salads in.   #monitor PSA with history prostate cancer- was released from  uroogy years ago  Recommended follow up: 6 months follow up, he plans for 1 year follow up instead  Lab/Order associations:   ICD-10-CM   1. Essential hypertension  I10 CBC    Comprehensive metabolic panel    Lipid panel    POCT Urinalysis Dipstick (Automated)  2. Dyslipidemia  E78.5 CBC    Comprehensive metabolic panel    Lipid panel  3. EMPHYSEMA  J43.8   4. Hyperglycemia  R73.9 Hemoglobin A1c  5. TOBACCO USE  F17.200   6. PROSTATE CANCER, HX OF  Z85.46 PSA   Return precautions advised.  Garret Reddish, MD

## 2018-10-10 NOTE — Addendum Note (Signed)
Addended by: Francis Dowse T on: 10/10/2018 09:46 AM   Modules accepted: Orders

## 2018-10-11 ENCOUNTER — Other Ambulatory Visit: Payer: Self-pay

## 2018-10-11 DIAGNOSIS — R319 Hematuria, unspecified: Secondary | ICD-10-CM

## 2018-10-13 ENCOUNTER — Telehealth: Payer: Self-pay | Admitting: Family Medicine

## 2018-10-13 NOTE — Telephone Encounter (Signed)
I called the patient to schedule AWV with Loma Sousa, but he declined because he just saw Dr. Yong Channel this week. VDM (DD)

## 2018-10-17 ENCOUNTER — Other Ambulatory Visit: Payer: Self-pay

## 2018-10-17 ENCOUNTER — Other Ambulatory Visit (INDEPENDENT_AMBULATORY_CARE_PROVIDER_SITE_OTHER): Payer: Medicare Other

## 2018-10-17 DIAGNOSIS — R319 Hematuria, unspecified: Secondary | ICD-10-CM | POA: Diagnosis not present

## 2018-10-17 LAB — URINALYSIS, MICROSCOPIC ONLY: RBC / HPF: NONE SEEN (ref 0–?)

## 2019-03-25 ENCOUNTER — Ambulatory Visit: Payer: Medicare Other | Attending: Internal Medicine

## 2019-03-25 DIAGNOSIS — Z23 Encounter for immunization: Secondary | ICD-10-CM | POA: Insufficient documentation

## 2019-03-25 NOTE — Progress Notes (Signed)
   Covid-19 Vaccination Clinic  Name:  Joe Hill    MRN: [id number] DOB: 02-Nov-1940  03/25/2019  [name] was observed post Covid-19 immunization for 15 minutes without incidence. He was provided with Vaccine Information Sheet and instruction to access the V-Safe system.   [name] was instructed to call 911 with any severe reactions post vaccine: [name] Difficulty breathing  . Swelling of your face and throat  . A fast heartbeat  . A bad rash all over your body  . Dizziness and weakness    Immunizations Administered    Name Date Dose VIS Date Route   Pfizer COVID-19 Vaccine 03/25/2019 10:59 AM 0.3 mL 01/05/2019 Intramuscular   Manufacturer: [place]   Lot: [id number]   [place]: [id number]

## 2019-04-10 ENCOUNTER — Ambulatory Visit (INDEPENDENT_AMBULATORY_CARE_PROVIDER_SITE_OTHER): Payer: Medicare Other | Admitting: Family Medicine

## 2019-04-10 ENCOUNTER — Encounter: Payer: Self-pay | Admitting: Family Medicine

## 2019-04-10 ENCOUNTER — Other Ambulatory Visit: Payer: Self-pay

## 2019-04-10 VITALS — BP 110/70 | HR 57 | Temp 98.6°F | Ht 70.0 in | Wt 207.8 lb

## 2019-04-10 DIAGNOSIS — I10 Essential (primary) hypertension: Secondary | ICD-10-CM | POA: Diagnosis not present

## 2019-04-10 DIAGNOSIS — R739 Hyperglycemia, unspecified: Secondary | ICD-10-CM

## 2019-04-10 DIAGNOSIS — E785 Hyperlipidemia, unspecified: Secondary | ICD-10-CM | POA: Diagnosis not present

## 2019-04-10 DIAGNOSIS — J438 Other emphysema: Secondary | ICD-10-CM | POA: Diagnosis not present

## 2019-04-10 DIAGNOSIS — F17200 Nicotine dependence, unspecified, uncomplicated: Secondary | ICD-10-CM

## 2019-04-10 NOTE — Progress Notes (Signed)
Phone [phone] In person visit   Subjective:   Joe Hill is a 79 y.o. year old very pleasant male patient who presents for/with See problem oriented charting Chief Complaint  Patient presents with  . Hypertension   This visit occurred during the SARS-CoV-2 public health emergency.  Safety protocols were in place, including screening questions prior to the visit, additional usage of staff PPE, and extensive cleaning of exam room while observing appropriate contact time as indicated for disinfecting solutions.   Past Medical History-  Patient Active Problem List   Diagnosis Date Noted  . EMPHYSEMA 10/16/2009    Priority: High  . TOBACCO USE 01/30/2007    Priority: High  . 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 Dyslipidemia 10/23/2016    Priority: Medium  . Hyperglycemia 08/12/2009    Priority: Medium  . Essential hypertension 08/04/2007    Priority: Medium  . Abnormal CT scan, chest 09/07/2010    Priority: Low  . GERD 09/09/2009    Priority: Low  . 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 amLODipine (NORVASC) 5 MG tablet TAKE 1 TABLET BY MOUTH EVERY DAY 90 tablet 3   No current facility-administered medications for this visit.     Objective:  BP 110/70   Pulse (!) 57   Temp 98.6 F (37 C) (Temporal)   Ht 5\' 10"  (1.778 m)   Wt 207 lb 12.8 oz (94.3 kg)   SpO2 98%   BMI 29.82 kg/m  Gen: NAD, resting comfortably CV: RRR no murmurs rubs or gallops Lungs: CTAB no crackles, wheeze, rhonchi Abdomen: soft/nontender/nondistended/normal bowel sounds.  Ext: no edema Skin: warm, dry    Assessment and Plan   # scheduled for 2nd covid vaccine on the 22nd  # declines lung cancer screening and prevnar  #consider ekg next visit  # Hypertension  S:controlled on Amlodipine 5 mg daily. Patient denies any chest pain, shortness of  breath, changes or changes in vision. Does not add salt to food. Tries to maintain a heart healthy diet.  BP Readings from Last 3 Encounters:  04/10/19 110/70  10/10/18 138/80  11/17/17 116/68  A/P: Stable. Continue current medications.    # Dyslipidemia  S:Not currently on any medications. Took Rosuvastatin in the past, d/c d/t side effects-even low-dose rosuvastatin 5 mg once a week-ideally with smoking history would get LDL below 70 consistently.  Lab Results  Component Value Date   CHOL 115 10/10/2018   HDL 33.80 (L) 10/10/2018   LDLCALC 65 10/10/2018   TRIG 82.0 10/10/2018   CHOLHDL 3 10/10/2018  A/P: excellent LDL under 70 and total cholesterol at 115. HDL still low at 33- discussed a more regular exercise regimen- he is doing a great job getting steps in on daily basis but just not intentional regular exercise.    - we did discuss potentially starting aspirin 81 mg even though guidelines would ont clearly recommend in his group- my thought is with smoking and low HDL that it may be protective since he has not tolerated statin even once a week- he declines for now.   # Hyperglycemia S: Elevated CGB's in the past but A1C's have not been elevated.  Lab Results  Component Value Date   HGBA1C 5.6 10/10/2018  A/P: CBGs high but a1c ok- we will continue to check yearly- defer until next visit   #  Emphysema/tobacco abuse S:just under a pack a day.   Continues to not feel limited by breathing- cuts lawn but has to stop to rest- he states feels this is normal for his age not even from a lung perspective alone. Emphysema was noted by CT and PFTs in 2011 with Dr. Gwenette Greet- luckily has not had worsening issues A/P: emphysema appears stable. He does not want to try an inhaler.  - strongly encouraged smoking cessation-he is not ready to quit   Recommended follow up: Return in about 6 months (around 10/11/2019) for annual wellness visit or sooner if needed. Future Appointments  Date Time  Provider [place]  04/16/2019  9:45 AM [facility]   Lab/Order associations:   ICD-10-CM   1. Essential hypertension  I10   2. Dyslipidemia  E78.5   3. Hyperglycemia  R73.9   4. EMPHYSEMA  J43.8   5. TOBACCO USE  F17.200     Return precautions advised.  Garret Reddish, MD

## 2019-04-10 NOTE — Patient Instructions (Addendum)
Health Maintenance Due  Topic Date Due  . COLONOSCOPY Patient not sure if he would like to have at this time. Son is very busy with work and kids and he does not feel like he needs at this time  10/09/2014  . TETANUS/TDAP Will need to get at pharmacy  05/01/2017   No changes today other than as always would encourage you to quit smoking for your long term health  Recommended follow up: Return in about 6 months (around 10/11/2019) for annual wellness visit or sooner if needed.

## 2019-04-16 ENCOUNTER — Ambulatory Visit: Payer: Medicare Other | Attending: Internal Medicine

## 2019-04-16 DIAGNOSIS — Z23 Encounter for immunization: Secondary | ICD-10-CM

## 2019-04-16 NOTE — Progress Notes (Signed)
   Covid-19 Vaccination Clinic  Name:  Joe Hill    MRN: [id number] DOB: 23-Apr-1940  04/16/2019  [name] was observed post Covid-19 immunization for 15 minutes without incident. He was provided with Vaccine Information Sheet and instruction to access the V-Safe system.   [name] was instructed to call 911 with any severe reactions post vaccine: [name] Difficulty breathing  . Swelling of face and throat  . A fast heartbeat  . A bad rash all over body  . Dizziness and weakness   Immunizations Administered    Name Date Dose VIS Date Route   Pfizer COVID-19 Vaccine 04/16/2019  9:29 AM 0.3 mL 01/05/2019 Intramuscular   Manufacturer: [place]   Lot: [id number]   [place]: [id number]

## 2019-09-03 ENCOUNTER — Other Ambulatory Visit: Payer: Self-pay | Admitting: Family Medicine

## 2019-10-11 NOTE — Progress Notes (Signed)
Phone [phone]    Subjective:  Patient presents today for their annual wellness visit (subsequent )  Preventive Screening-Counseling & Management  Modifiable Risk Factors/behavioral risk assessment/psychosocial risk assessment Regular exercise:  Mows his lawn, active with work. Does not feel has a lot of extra energy for exercise on top of this Diet: has some unhealthy habits but has been able to maintain weight- did discuss trying to increase fruits/veggies and limiting processed foods Wt Readings from Last 3 Encounters:  10/15/19 206 lb 9.6 oz (93.7 kg)  04/10/19 207 lb 12.8 oz (94.3 kg)  10/10/18 205 lb (93 kg)   Smoking Status:  Smoker just under a pack a day and sometimes less. Encouraged cessation. Declines yearly CT for lung cancer screening Second Hand Smoking status: No smokers in home Alcohol intake: 0 per week No drugs  Cardiac risk factors:  advanced age (older than 72 for men, 57 for women)  smoking dyslipidemia treated Hypertension  No diabetes.  Lab Results  Component Value Date   HGBA1C 5.6 10/10/2018  Family History: none  Family History  Problem Relation Age of Onset  . Hyperlipidemia Mother   . Cancer Father        lung, smoker  . Cancer Sister        breast, smoker  . Arthritis Maternal Aunt     Depression Screen/risk evaluation Risk factors: denies.[name] PHQ2 0  Depression screen [facility] 2/9 10/15/2019 10/10/2018 11/17/2017 10/07/2016 10/02/2015  Decreased Interest 0 0 0 0 0  Down, Depressed, Hopeless 0 0 0 0 0  PHQ - 2 Score 0 0 0 0 0    Functional ability and level of safety Mobility assessment:  timed get up and go <12 seconds Activities of Daily Living- Independent in ADLs (toileting, bathing, dressing, transferring, eating) and in IADLs (shopping, housekeeping, managing own medications, and handling finances) Home Safety: Loose rugs (no), smoke detectors (up to date), small pets (no), grab bars (does not feel at a point where he needs these),  stairs (none- 1 level home), life-alert system (keeps cell phone on him) Hearing Difficulties: -patient declines Fall Risk: None  Fall Risk  10/15/2019 10/10/2018 11/17/2017 10/07/2016 10/02/2015  Falls in the past year? 0 0 No No No  Number falls in past yr: 0 0 - - -  Injury with Fall? 0 0 - - -   Opioid use history:  no long term opioids use Self assessment of health status: "good"  Cognitive Testing             No reported trouble.   Mini cog: normal clock draw. 1/3 delayed recall. Normal test result   List the Names of Other Physician/Practitioners you currently use: Patient Care Team: [name], MD as PCP - General (Family Medicine) -considering seeing an eye doctor  Required Immunizations needed today:  Declines flu shot despite smoking history Immunization History  Administered Date(s) Administered  . Influenza Whole 03/23/2005  . PFIZER SARS-COV-2 Vaccination 03/25/2019, 04/16/2019  . Pneumococcal Polysaccharide-23 05/02/2007  . Td 05/02/2007   Health Maintenance  Topic Date Due  .  Hepatitis C: One time screening is recommended by Center for Disease Control  (CDC) for  adults born from [birth year] through 1965.   Never done  . Colon Cancer Screening  10/09/2014  . Flu Shot  08/26/2019  . Tetanus Vaccine  04/09/2020*  . Pneumonia vaccines (2 of 2 - PCV13) 10/01/2024*  . COVID-19 Vaccine  Completed  *Topic was postponed. The date shown is not  the original due date.    Screening tests-  Health Maintenance Due  Topic Date Due  . Hepatitis C Screening - with labs Never done  . COLONOSCOPY - see below 10/09/2014  1. Colon cancer screening- declines for now- has to work out situation with his son to sit with him for the visit 2. Lung Cancer screening- declines 3. Skin cancer screening- saw dermatologist 4-5 years ago for a mole in back of head and reassuring exam. No new concerning lesions since that time. Advised regular sunscreen 4. Prostate cancer screening- history  of prostate cancer so will get PSA with labs Lab Results  Component Value Date   PSA 0.01 (L) 10/10/2018   PSA 0.01 (L) 11/17/2017   PSA 0.01 (L) 10/21/2016   The following were reviewed and entered/updated in epic if appropriate: Past Medical History:  Diagnosis Date  . 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 COLONIC POLYPS, HX OF 10/11/2006  . COPD 03/31/2009  . EMPHYSEMA 10/16/2009  . GERD 09/09/2009  . HEMOPTYSIS UNSPECIFIED 07/30/2009  . HYPERGLYCEMIA 08/12/2009  . HYPERTENSION 08/04/2007  . 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 TOBACCO USE 01/30/2007   Patient Active Problem List   Diagnosis Date Noted  . EMPHYSEMA 10/16/2009    Priority: High  . TOBACCO USE 01/30/2007    Priority: High  . 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 Dyslipidemia 10/23/2016    Priority: Medium  . Hyperglycemia 08/12/2009    Priority: Medium  . Essential hypertension 08/04/2007    Priority: Medium  . Abnormal CT scan, chest 09/07/2010    Priority: Low  . GERD 09/09/2009    Priority: Low  . 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/11/2006    Priority: Low   Past Surgical History:  Procedure Laterality Date  . APPENDECTOMY    . CATARACT EXTRACTION    . PROSTATE SURGERY     prostatectomy    Family History  Problem Relation Age of Onset  . Hyperlipidemia Mother   . Cancer Father        lung, smoker  . Cancer Sister        breast, smoker  . Arthritis Maternal Aunt     Medications- reviewed and updated Current Outpatient Medications  Medication Sig Dispense Refill  . amLODipine (NORVASC) 5 MG tablet TAKE 1 TABLET BY MOUTH EVERY DAY 90 tablet 3   No current facility-administered medications for this visit.    Allergies-reviewed and updated Allergies  Allergen Reactions  . Penicillins Hives    Social History   Socioeconomic History  . Marital status: Single    Spouse name: Not on file  . Number of children: Not on file  . Years of education: Not on file  . Highest education  level: Not on file  Occupational History  . Not on file  Tobacco Use  . Smoking status: Current Every Day Smoker    Packs/day: 0.50    Types: Cigarettes  . Smokeless tobacco: Never Used  Substance and Sexual Activity  . Alcohol use: No    Alcohol/week: 0.0 standard drinks  . Drug use: No  . Sexual activity: Not on file  Other Topics Concern  . Not on file  Social History Narrative   Divorced. 2 sons. 5 grandkids.       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       HObbies: woodworking, taking care of yard, very active  at work   Social Determinants of Radio broadcast assistant Strain:   . Difficulty of Paying Living Expenses: Not on file  Food Insecurity:   . Worried About Charity fundraiser in the Last Year: Not on file  . Ran Out of Food in the Last Year: Not on file  Transportation Needs:   . Lack of Transportation (Medical): Not on file  . Lack of Transportation (Non-Medical): Not on file  Physical Activity:   . Days of Exercise per Week: Not on file  . Minutes of Exercise per Session: Not on file  Stress:   . Feeling of Stress : Not on file  Social Connections:   . Frequency of Communication with Friends and Family: Not on file  . Frequency of Social Gatherings with Friends and Family: Not on file  . Attends Religious Services: Not on file  . Active Member of Clubs or Organizations: Not on file  . Attends Archivist Meetings: Not on file  . Marital Status: Not on file      Objective:  BP 122/62   Pulse (!) 51   Temp 98 F (36.7 C) (Temporal)   Resp 18   Ht 5\' 10"  (1.778 m)   Wt 206 lb 9.6 oz (93.7 kg)   SpO2 100%   BMI 29.64 kg/m  Gen: NAD, resting comfortably   Assessment/Plan:  AWV completed 1. Educated, counseled and referred based on above elements 2. Educated, counseled and referred as appropriate for preventative needs 3. Discussed and documented a written  plan for preventiative services and screenings with personalized health advice- After Visit Summary was given to patient which included this plan   Status of chronic or acute concerns   See separate note  Recommended follow up: 6 month follow up   Lab/Order associations:   ICD-10-CM   1. Preventative health care  Z00.00   2. Encounter for hepatitis C screening test for low risk patient  Z11.59 Hepatitis C antibody  3. Screening for colon cancer  Z12.11 Ambulatory referral to Gastroenterology    No orders of the defined types were placed in this encounter.    Return precautions advised.  Garret Reddish, MD

## 2019-10-11 NOTE — Patient Instructions (Addendum)
Health Maintenance Due  Topic Date Due  . COLONOSCOPY declines for now- has to work out situation with his son to sit with him for the visit  10/09/2014  . INFLUENZA VACCINE Declined in office flu shot.  08/26/2019   Please stop by lab before you go If you have mychart- we will send your results within 3 business days of [place] receiving them.  If you do not have mychart- we will call you about results within 5 business days of [place] receiving them.  *please note we are currently using [facility] which has a longer processing time than [place] typically so labs may not come back as quickly as in the past *please also note that you will see labs on mychart as soon as they post. I will later go in and write notes on them- will say "notes from Dr. Yong Channel"    Mr. Bonn , Thank you for taking time to come for your Medicare Wellness Visit. I appreciate your ongoing commitment to your health goals. Please review the following plan we discussed and let me know if I can assist you in the future.   These are the goals we discussed: 1. Strongly encourage you to quit smoking   This is a list of the screening recommended for you and due dates:  Health Maintenance  Topic Date Due  .  Hepatitis C: One time screening is recommended by Center for Disease Control  (CDC) for  adults born from [birth year] through 1965.   Never done  . Colon Cancer Screening  10/09/2014  . Flu Shot  08/26/2019  . Tetanus Vaccine  04/09/2020*  . Pneumonia vaccines (2 of 2 - PCV13) 10/01/2024*  . COVID-19 Vaccine  Completed  *Topic was postponed. The date shown is not the original due date.

## 2019-10-15 ENCOUNTER — Encounter: Payer: Self-pay | Admitting: Family Medicine

## 2019-10-15 ENCOUNTER — Ambulatory Visit (INDEPENDENT_AMBULATORY_CARE_PROVIDER_SITE_OTHER): Payer: Medicare Other | Admitting: Family Medicine

## 2019-10-15 ENCOUNTER — Other Ambulatory Visit: Payer: Self-pay

## 2019-10-15 VITALS — BP 122/62 | HR 51 | Temp 98.0°F | Resp 18 | Ht 70.0 in | Wt 206.6 lb

## 2019-10-15 DIAGNOSIS — Z0001 Encounter for general adult medical examination with abnormal findings: Secondary | ICD-10-CM

## 2019-10-15 DIAGNOSIS — E785 Hyperlipidemia, unspecified: Secondary | ICD-10-CM | POA: Diagnosis not present

## 2019-10-15 DIAGNOSIS — R001 Bradycardia, unspecified: Secondary | ICD-10-CM

## 2019-10-15 DIAGNOSIS — R739 Hyperglycemia, unspecified: Secondary | ICD-10-CM | POA: Diagnosis not present

## 2019-10-15 DIAGNOSIS — Z1159 Encounter for screening for other viral diseases: Secondary | ICD-10-CM

## 2019-10-15 DIAGNOSIS — Z8546 Personal history of malignant neoplasm of prostate: Secondary | ICD-10-CM

## 2019-10-15 DIAGNOSIS — I10 Essential (primary) hypertension: Secondary | ICD-10-CM | POA: Diagnosis not present

## 2019-10-15 DIAGNOSIS — Z1211 Encounter for screening for malignant neoplasm of colon: Secondary | ICD-10-CM

## 2019-10-15 DIAGNOSIS — Z0000 Encounter for general adult medical examination without abnormal findings: Secondary | ICD-10-CM

## 2019-10-15 NOTE — Progress Notes (Signed)
Phone [phone] In person visit   Subjective:   Joe Hill is a 79 y.o. year old very pleasant male patient who presents for/with See problem oriented charting Chief Complaint  Patient presents with  . Hypertension   This visit occurred during the SARS-CoV-2 public health emergency.  Safety protocols were in place, including screening questions prior to the visit, additional usage of staff PPE, and extensive cleaning of exam room while observing appropriate contact time as indicated for disinfecting solutions.   Past Medical History-  Patient Active Problem List   Diagnosis Date Noted  . EMPHYSEMA 10/16/2009    Priority: High  . TOBACCO USE 01/30/2007    Priority: High  . 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 Dyslipidemia 10/23/2016    Priority: Medium  . Hyperglycemia 08/12/2009    Priority: Medium  . Essential hypertension 08/04/2007    Priority: Medium  . Abnormal CT scan, chest 09/07/2010    Priority: Low  . GERD 09/09/2009    Priority: Low  . 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 amLODipine (NORVASC) 5 MG tablet TAKE 1 TABLET BY MOUTH EVERY DAY 90 tablet 3   No current facility-administered medications for this visit.     Objective:  BP 122/62   Pulse (!) 51   Temp 98 F (36.7 C) (Temporal)   Resp 18   Ht 5\' 10"  (1.778 m)   Wt 206 lb 9.6 oz (93.7 kg)   SpO2 100%   BMI 29.64 kg/m  Gen: NAD, resting comfortably CV: bradycardic but regular no murmurs rubs or gallops Lungs: CTAB no crackles, wheeze, rhonchi Abdomen: soft/nontender/nondistended/normal bowel sounds. No rebound or guarding.  Ext: no edema Skin: warm, dry Neuro: grossly normal, moves all extremities  EKG: sinus bradycardia with rate 52, normal axis, normal intervals (pr borderline), no hypertrophy, no significant st or t wave changes (st depressions  noted less than 1 box in v4-v6 but I believe movement may hve affected ekg machine reading       Assessment and Plan   #ekg under bradycardia- baseline today  # Emphysema/tobacco abuse S:Under a pack a day. Denies limitatiosn in breathing other than stopping to rest when mowing yard. Emphysema noted on CT in past and PFTs in 2011 with Dr. Gwenette Greet.  -has declined lung cancer screening- declines again today -cessation counseling: yes but not ready to quit  A/P: emphysema- discussed having albuterol on hand but declines. Seems stable - encouraged smoking cessation- not ready to quit.    #hypertension S: medication:  amlodipine 5 mg Home readings #s: checks if feels- normal #s A/P: Stable. Continue current medications. Get baseline EKG  #dyslipidemia- low HDL, LDL <70 S: Medication: none- did not tolerate rosuvastatin in past due to side effects- upset stomach Lab Results  Component Value Date   CHOL 115 10/10/2018   HDL 33.80 (L) 10/10/2018   LDLCALC 65 10/10/2018   TRIG 82.0 10/10/2018   CHOLHDL 3 10/10/2018  A/P: we will update this today- he wants to hold off on statin and aspirin and also discussed coronary calcium and opts out. Diet and exercise could improve #s potentially  # Hyperglycemia/insulin resistance/prediabetes- a1c has not been elevated, just fasting CBGs S:  Medication: none Exercise and diet- active, diet not the best.  A/P: suspect controlled update a1c today  Recommended follow up:  6 months  Lab/Order associations:   ICD-10-CM   5. Bradycardia as manifestation of blood transfusion reaction  T80.89XA EKG 12-Lead   R00.1    Y84.8   6. Essential hypertension  I10 CBC With Differential/Platelet    COMPLETE METABOLIC PANEL WITH GFR    Lipid Panel (Refl)  7. Dyslipidemia  E78.5 CBC With Differential/Platelet    COMPLETE METABOLIC PANEL WITH GFR    Lipid Panel (Refl)  8. Hyperglycemia  R73.9 Hemoglobin A1c   Return precautions advised.  Garret Reddish,  MD

## 2019-10-16 LAB — COMPLETE METABOLIC PANEL WITH GFR
AG Ratio: 1.6 (calc) (ref 1.0–2.5)
ALT: 10 U/L (ref 9–46)
AST: 15 U/L (ref 10–35)
Albumin: 4.2 g/dL (ref 3.6–5.1)
Alkaline phosphatase (APISO): 91 U/L (ref 35–144)
BUN: 15 mg/dL (ref 7–25)
CO2: 30 mmol/L (ref 20–32)
Calcium: 9 mg/dL (ref 8.6–10.3)
Chloride: 102 mmol/L (ref 98–110)
Creat: 0.97 mg/dL (ref 0.70–1.18)
GFR, Est African American: 86 mL/min/{1.73_m2} (ref >=60)
GFR, Est Non African American: 74 mL/min/{1.73_m2} (ref >=60)
Globulin: 2.6 g/dL (calc) (ref 1.9–3.7)
Glucose, Bld: 93 mg/dL (ref 65–99)
Potassium: 3.1 mmol/L — ABNORMAL LOW (ref 3.5–5.3)
Sodium: 139 mmol/L (ref 135–146)
Total Bilirubin: 0.9 mg/dL (ref 0.2–1.2)
Total Protein: 6.8 g/dL (ref 6.1–8.1)

## 2019-10-16 LAB — HEPATITIS C ANTIBODY
Hepatitis C Ab: NONREACTIVE
SIGNAL TO CUT-OFF: 0.02 (ref <1.00)

## 2019-10-16 LAB — PSA: PSA: <0.04 ng/mL (ref <=4.0)

## 2019-10-16 LAB — CBC WITH DIFFERENTIAL/PLATELET
Absolute Monocytes: 444 cells/uL (ref 200–950)
Basophils Absolute: 41 cells/uL (ref 0–200)
Basophils Relative: 0.8 %
Eosinophils Absolute: 82 cells/uL (ref 15–500)
Eosinophils Relative: 1.6 %
HCT: 42.8 % (ref 38.5–50.0)
Hemoglobin: 14.8 g/dL (ref 13.2–17.1)
Lymphs Abs: 724 cells/uL — ABNORMAL LOW (ref 850–3900)
MCH: 33 pg (ref 27.0–33.0)
MCHC: 34.6 g/dL (ref 32.0–36.0)
MCV: 95.5 fL (ref 80.0–100.0)
MPV: 9.8 fL (ref 7.5–12.5)
Monocytes Relative: 8.7 %
Neutro Abs: 3810 cells/uL (ref 1500–7800)
Neutrophils Relative %: 74.7 %
Platelets: 224 10*3/uL (ref 140–400)
RBC: 4.48 10*6/uL (ref 4.20–5.80)
RDW: 12.8 % (ref 11.0–15.0)
Total Lymphocyte: 14.2 %
WBC: 5.1 10*3/uL (ref 3.8–10.8)

## 2019-10-16 LAB — LIPID PANEL (REFL)
Cholesterol: 116 mg/dL (ref <200)
HDL: 38 mg/dL — ABNORMAL LOW (ref >=40)
LDL Cholesterol (Calc): 65 mg/dL (calc)
Non-HDL Cholesterol (Calc): 78 mg/dL (calc) (ref <130)
Total CHOL/HDL Ratio: 3.1 (calc) (ref <5.0)
Triglycerides: 53 mg/dL (ref <150)

## 2019-10-16 LAB — HEMOGLOBIN A1C
Hgb A1c MFr Bld: 5.4 % of total Hgb (ref <5.7)
Mean Plasma Glucose: 108 (calc)
eAG (mmol/L): 6 (calc)

## 2020-02-27 DIAGNOSIS — H25099 Other age-related incipient cataract, unspecified eye: Secondary | ICD-10-CM | POA: Diagnosis not present

## 2020-04-04 DIAGNOSIS — H18413 Arcus senilis, bilateral: Secondary | ICD-10-CM | POA: Diagnosis not present

## 2020-04-04 DIAGNOSIS — H02831 Dermatochalasis of right upper eyelid: Secondary | ICD-10-CM | POA: Diagnosis not present

## 2020-04-04 DIAGNOSIS — H26491 Other secondary cataract, right eye: Secondary | ICD-10-CM | POA: Diagnosis not present

## 2020-04-04 DIAGNOSIS — Z961 Presence of intraocular lens: Secondary | ICD-10-CM | POA: Diagnosis not present

## 2020-04-09 NOTE — Patient Instructions (Incomplete)
Health Maintenance Due  Topic Date Due  . COLONOSCOPY (Pts 45-74yrs Insurance coverage will need to be confirmed)  10/09/2014  . INFLUENZA VACCINE  08/26/2019  . COVID-19 Vaccine (3 - Booster for Pfizer series) 10/17/2019   Depression screen [facility] 2/9 10/15/2019 10/10/2018 11/17/2017  Decreased Interest 0 0 0  Down, Depressed, Hopeless 0 0 0  PHQ - 2 Score 0 0 0

## 2020-04-09 NOTE — Progress Notes (Deleted)
  Phone [phone] In person visit   Subjective:   Joe Hill is a 80 y.o. year old very pleasant male patient who presents for/with See problem oriented charting No chief complaint on file.   This visit occurred during the SARS-CoV-2 public health emergency.  Safety protocols were in place, including screening questions prior to the visit, additional usage of staff PPE, and extensive cleaning of exam room while observing appropriate contact time as indicated for disinfecting solutions.   Past Medical History-  Patient Active Problem List   Diagnosis Date Noted  . Dyslipidemia 10/23/2016  . Abnormal CT scan, chest 09/07/2010  . EMPHYSEMA 10/16/2009  . GERD 09/09/2009  . Hyperglycemia 08/12/2009  . Essential hypertension 08/04/2007  . TOBACCO USE 01/30/2007  . 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/11/2006    Medications- reviewed and updated Current Outpatient Medications  Medication Sig Dispense Refill  . amLODipine (NORVASC) 5 MG tablet TAKE 1 TABLET BY MOUTH EVERY DAY 90 tablet 3   No current facility-administered medications for this visit.     Objective:  There were no vitals taken for this visit. Gen: NAD, resting comfortably CV: RRR no murmurs rubs or gallops Lungs: CTAB no crackles, wheeze, rhonchi Abdomen: soft/nontender/nondistended/normal bowel sounds. No rebound or guarding.  Ext: no edema Skin: warm, dry Neuro: grossly normal, moves all extremities  ***    Assessment and Plan  *** ***awv 10/15/19 ***always bradycardic ***ekg under bradycardia next visit  # Emphysema/tobacco abuse S:Under a pack a day. Denies limitations in breathing other than stopping to rest when mowing yard. Emphysema noted on CT in past and PFTs in 2011 with Dr. Gwenette Greet.  -has declined lung cancer screening -cessation counseling: ***  A/P: ***   #hypertension S: medication: *** amlodipine 5 mg Home readings #s: *** A/P: ***  #dyslipidemia-  low HDL, LDL <70 S: Medication:*** none- did not tolerate rosuvastatin in past due to side effects- upset stomach A/P: ***  # Hyperglycemia/insulin resistance/prediabetes- a1c has not been elevated, just fasting CBGs S:  Medication: *** Exercise and diet- *** A/P: ***  No problem-specific Assessment & Plan notes found for this encounter.   Recommended follow up: ***No follow-ups on file. Future Appointments  Date Time Provider [place]  04/14/2020  8:00 AM [name], MD [facility]    Lab/Order associations: No diagnosis found.  No orders of the defined types were placed in this encounter.   Time Spent: *** minutes of total time (7:32 PM***- 7:32 PM***) was spent on the date of the encounter performing the following actions: chart review prior to seeing the patient, obtaining history, performing a medically necessary exam, counseling on the treatment plan, placing orders, and documenting in our EHR.   Return precautions advised.  Clyde Lundborg, CMA

## 2020-04-14 ENCOUNTER — Ambulatory Visit: Payer: Medicare Other | Admitting: Family Medicine

## 2020-04-14 DIAGNOSIS — K219 Gastro-esophageal reflux disease without esophagitis: Secondary | ICD-10-CM

## 2020-04-14 DIAGNOSIS — R739 Hyperglycemia, unspecified: Secondary | ICD-10-CM

## 2020-04-14 DIAGNOSIS — E785 Hyperlipidemia, unspecified: Secondary | ICD-10-CM

## 2020-04-14 DIAGNOSIS — I10 Essential (primary) hypertension: Secondary | ICD-10-CM

## 2020-04-24 NOTE — Progress Notes (Signed)
Phone [phone] In person visit   Subjective:   Joe Hill is a 80 y.o. year old very pleasant male patient who presents for/with See problem oriented charting Chief Complaint  Patient presents with  . Follow-up    Dyslipidemia, hyperglycemia, blood pressure. Prediabetic    This visit occurred during the SARS-CoV-2 public health emergency.  Safety protocols were in place, including screening questions prior to the visit, additional usage of staff PPE, and extensive cleaning of exam room while observing appropriate contact time as indicated for disinfecting solutions.   Past Medical History-  Patient Active Problem List   Diagnosis Date Noted  . EMPHYSEMA 10/16/2009    Priority: High  . TOBACCO USE 01/30/2007    Priority: High  . 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 Dyslipidemia 10/23/2016    Priority: Medium  . Hyperglycemia 08/12/2009    Priority: Medium  . Essential hypertension 08/04/2007    Priority: Medium  . Abnormal CT scan, chest 09/07/2010    Priority: Low  . GERD 09/09/2009    Priority: Low  . 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 amLODipine (NORVASC) 5 MG tablet TAKE 1 TABLET BY MOUTH EVERY DAY 90 tablet 3   No current facility-administered medications for this visit.     Objective:  BP 130/70   Pulse (!) 55   Temp 98.2 F (36.8 C) (Temporal)   Ht 5\' 10"  (1.778 m)   Wt 203 lb (92.1 kg)   SpO2 98%   BMI 29.13 kg/m  Gen: NAD, resting comfortably Nasal turbinates erythematous and slightly swollen, some drainage in pharynx CV: slightly bradycardic but regular no murmurs rubs or gallops Lungs: CTAB no crackles, wheeze, rhonchi Abdomen: soft/nontender/nondistended/normal bowel sounds. No rebound or guarding.  Ext: no edema Skin: warm, dry    Assessment and Plan   # Emphysema/tobacco abuse S:Under a pack a  day. Denies limitations in breathing other than stopping to rest when mowing yard but no recent worsening. Emphysema noted on CT in past and PFTs in 2011 with Dr. Gwenette Greet.  -has declined lung cancer screening-declines again today plus would have aged out -cessation counseling: today but not ready to quit  A/P: emphysema noted- declines inhaler. symptom stable. Encouraged cessation but hes not ready to quit   # Rhinorrhea S:started in December and has had intermittent issues since that time. Happens similar inside and out of the house. No head trauma- worse on the right side  -he has tried nasacort and flonase and didn't benefit. Sounds like may use afrin as well- advised against.  - has not tried OTC antihistamine  A/P: I am not overly confident this will work but would try a claritin or allegra before bed for at least 2 weeks to see if that helps with runny nose symptoms   #hypertension S: medication:  amlodipine 5 mg Home readings #s: does not check BP Readings from Last 3 Encounters:  04/28/20 130/70  10/15/19 122/62  04/10/19 110/70  A/P: Stable. Continue current medications.   #dyslipidemia- low HDL, LDL <70 S: Medication: none- did not tolerate rosuvastatin in past due to side effects- upset stomach even on just once a week  Lab Results  Component Value Date   CHOL 116 10/15/2019   HDL 38 (L) 10/15/2019   LDLCALC 65 10/15/2019   TRIG 53 10/15/2019   CHOLHDL 3.1 10/15/2019  A/P: #s looked really good overall except for low HDL. We will continue without statin particular since he has reached age 44 with no evidence of heart attacks or strokes in past.   # Hyperglycemia/insulin resistance- a1c has not been elevated, just fasting CBGs S:  Medication: none Exercise and diet- remains active - mowing own grass. Walks a fair amount with work. Down 4 lbs from last vist- he actually feels better at this weight or 210 than he does in 180s  Lab Results  Component Value Date   HGBA1C 5.4  10/15/2019   HGBA1C 5.6 10/10/2018   HGBA1C 5.4 11/17/2017  A/P: hopefully stable- update a1c next visit  #slightly low potassium- offered to repeat today - declines for now  Recommended follow up: Return in about 6 months (around 10/28/2020) for follow up- or sooner if needed.   Lab/Order associations:   ICD-10-CM   1. Essential hypertension  I10   2. Dyslipidemia  E78.5   3. Hyperglycemia  R73.9     No orders of the defined types were placed in this encounter.   Return precautions advised.  Garret Reddish, MD

## 2020-04-24 NOTE — Patient Instructions (Addendum)
  Health Maintenance Due  Topic Date Due  . COLONOSCOPY -overdue since 2015 but has not been able to work out situation for someone to sit with him yet. Please try to work on this 10/09/2014  . TETANUS/TDAP - recommend doing this at the pharmacy 05/01/2017  . COVID-19 Vaccine - could consider getting 3rd shot/1st booster at pharmacy 10/17/2019   I am not overly confident this will work but would try a claritin or allegra or zyrtec (or generic) before bed for at least 2 weeks to see if that helps with runny nose symptoms  Recommended follow up: Return in about 6 months (around 10/28/2020) for follow up- or sooner if needed.

## 2020-04-28 ENCOUNTER — Ambulatory Visit (INDEPENDENT_AMBULATORY_CARE_PROVIDER_SITE_OTHER): Payer: Medicare Other | Admitting: Family Medicine

## 2020-04-28 ENCOUNTER — Encounter: Payer: Self-pay | Admitting: Family Medicine

## 2020-04-28 ENCOUNTER — Other Ambulatory Visit: Payer: Self-pay

## 2020-04-28 VITALS — BP 130/70 | HR 55 | Temp 98.2°F | Ht 70.0 in | Wt 203.0 lb

## 2020-04-28 DIAGNOSIS — K219 Gastro-esophageal reflux disease without esophagitis: Secondary | ICD-10-CM

## 2020-04-28 DIAGNOSIS — R739 Hyperglycemia, unspecified: Secondary | ICD-10-CM | POA: Diagnosis not present

## 2020-04-28 DIAGNOSIS — I10 Essential (primary) hypertension: Secondary | ICD-10-CM | POA: Diagnosis not present

## 2020-04-28 DIAGNOSIS — J438 Other emphysema: Secondary | ICD-10-CM | POA: Diagnosis not present

## 2020-04-28 DIAGNOSIS — E785 Hyperlipidemia, unspecified: Secondary | ICD-10-CM | POA: Diagnosis not present

## 2020-07-22 ENCOUNTER — Ambulatory Visit (INDEPENDENT_AMBULATORY_CARE_PROVIDER_SITE_OTHER): Payer: Medicare Other | Admitting: Family

## 2020-07-22 ENCOUNTER — Other Ambulatory Visit: Payer: Self-pay

## 2020-07-22 ENCOUNTER — Encounter: Payer: Self-pay | Admitting: Family

## 2020-07-22 VITALS — BP 156/78 | HR 61 | Temp 98.1°F | Ht 70.0 in | Wt 198.0 lb

## 2020-07-22 DIAGNOSIS — I499 Cardiac arrhythmia, unspecified: Secondary | ICD-10-CM | POA: Diagnosis not present

## 2020-07-22 DIAGNOSIS — I491 Atrial premature depolarization: Secondary | ICD-10-CM | POA: Diagnosis not present

## 2020-07-22 DIAGNOSIS — R490 Dysphonia: Secondary | ICD-10-CM | POA: Diagnosis not present

## 2020-07-22 DIAGNOSIS — J302 Other seasonal allergic rhinitis: Secondary | ICD-10-CM

## 2020-07-22 MED ORDER — PREDNISONE 10 MG (21) PO TBPK: ORAL_TABLET | ORAL | 0 refills | Status: DC

## 2020-07-22 NOTE — Patient Instructions (Signed)
Allergic Rhinitis, Adult Allergic rhinitis is a reaction to allergens. Allergens are things that can cause an allergic reaction. This condition affects the lining inside the nose (mucous membrane). There are two types of allergic rhinitis: Seasonal. This type is also called hay fever. It happens only during some times of the year. Perennial. This type can happen at any time of the year. This condition cannot be spread from person to person (is not contagious). It can be mild, worse, or very bad. It can develop at any age and may beoutgrown. What are the causes? This condition may be caused by: Pollen from grasses, trees, and weeds. Dust mites. Smoke. Mold. Car fumes. The pee (urine), spit, or dander of pets. Dander is dead skin cells from a pet. What increases the risk? You are more likely to develop this condition if: You have allergies in your family. You have problems like allergies in your family. You may have: Swelling of parts of your eyes and eyelids. Asthma. This affects how you breathe. Long-term redness and swelling on your skin. Food allergies. What are the signs or symptoms? The main symptom of this condition is a runny or stuffy nose (nasal congestion). Other symptoms may include: Sneezing or coughing. Itching and tearing of your eyes. Mucus that drips down the back of your throat (postnasal drip). Trouble sleeping. Feeling tired. Headache. Sore throat. How is this treated? There is no cure for this condition. You should avoid things that you are allergic to. Treatment can help to relieve symptoms. This may include: Medicines that block allergy symptoms, such as corticosteroids or antihistamines. These may be given as a shot, nasal spray, or pill. Avoiding things you are allergic to. Medicines that give you bits of what you are allergic to over time. This is called immunotherapy. It is done if other treatments do not help. You may get: Shots. Medicine under your  tongue. Stronger medicines, if other treatments do not help. Follow these instructions at home: Avoiding allergens Find out what things you are allergic to and avoid them. To do this, try these things: If you get allergies any time of year: Replace carpet with wood, tile, or vinyl flooring. Carpet can trap pet dander and dust. Do not smoke. Do not allow smoking in your home. Change your heating and air conditioning filters at least once a month. If you get allergies only some times of the year: Keep windows closed when you can. Plan things to do outside when pollen counts are lowest. Check pollen counts before you plan things to do outside. When you come indoors, change your clothes and shower before you sit on furniture or bedding. If you are allergic to a pet: Keep the pet out of your bedroom. Vacuum, sweep, and dust often.  General instructions Take over-the-counter and prescription medicines only as told by your doctor. Drink enough fluid to keep your pee (urine) pale yellow. Keep all follow-up visits as told by your doctor. This is important. Where to find more information American Academy of Allergy, Asthma & Immunology: www.aaaai.org Contact a doctor if: You have a fever. You get a cough that does not go away. You make whistling sounds when you breathe (wheeze). Your symptoms slow you down. Your symptoms stop you from doing your normal things each day. Get help right away if: You are short of breath. This symptom may be an emergency. Do not wait to see if the symptom will go away. Get medical help right away. Call your local emergency  services (911 in the [place] Do not drive yourself to the hospital. Summary Allergic rhinitis may be treated by taking medicines and avoiding things you are allergic to. If you have allergies only some of the year, keep windows closed when you can at those times. Contact your doctor if you get a fever or a cough that does not go away. This  information is not intended to replace advice given to you by your health care provider. Make sure you discuss any questions you have with your healthcare provider. Document Revised: 03/05/2019 Document Reviewed: 01/09/2019 Elsevier Patient Education  2022 [employer].

## 2020-07-23 ENCOUNTER — Telehealth: Payer: Self-pay | Admitting: Family

## 2020-07-23 NOTE — Telephone Encounter (Signed)
Patient is returning a phone call.

## 2020-07-23 NOTE — Telephone Encounter (Signed)
Lvm for the pt to call the office back.

## 2020-07-23 NOTE — Progress Notes (Signed)
Acute Office Visit  Subjective:    Patient ID: Joe Hill, male    DOB: February 15, 1940, 80 y.o.   MRN: [id number]  Chief Complaint  Patient presents with   Hoarse    Pt says that he was cutting grass over the weekend. He was able to gargle with warm salt water, and this did help some.    HPI Patient is in today with concerns of hoarseness and congestion after cutting grass over the weekend. He has been gargling with salt water that helps. Has a history of allergic rhinitis. Wanted to have it checked. No chest pain, SOB  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   TOBACCO USE 01/30/2007    Past Surgical History:  Procedure Laterality Date   APPENDECTOMY     CATARACT EXTRACTION     PROSTATE SURGERY     prostatectomy    Family History  Problem Relation Age of Onset   Hyperlipidemia Mother    Cancer Father        lung, smoker   Cancer Sister        breast, smoker   Arthritis Maternal Aunt     Social History   Socioeconomic History   Marital status: Single    Spouse name: Not on file   Number of children: Not on file   Years of education: Not on file   Highest education level: Not on file  Occupational History   Not on file  Tobacco Use   Smoking status: Every Day    Packs/day: 0.50    Pack years: 0.00    Types: Cigarettes   Smokeless tobacco: Never  Substance and Sexual Activity   Alcohol use: No    Alcohol/week: 0.0 standard drinks   Drug use: No   Sexual activity: Not on file  Other Topics Concern   Not on file  Social History Narrative   Divorced. 2 sons. 5 grandkids- 2 in college, 3 in hs in 2021.       Worked in [employer] loss [employer] life. 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       HObbies: woodworking, taking care of yard, very active at work   Social Determinants of Radio broadcast assistant Strain: Not on file  Food Insecurity: Not on file  Transportation Needs: Not on file  Physical Activity: Not on file  Stress: Not on file  Social Connections: Not on file  Intimate Partner Violence: Not on file    Outpatient Medications Prior to Visit  Medication Sig Dispense Refill   amLODipine (NORVASC) 5 MG tablet TAKE 1 TABLET BY MOUTH EVERY DAY 90 tablet 3   No facility-administered medications prior to visit.    Allergies  Allergen Reactions   Penicillins Hives    Review of Systems  Constitutional: Negative.   HENT:  Positive for congestion and voice change.        Hoarseness  Respiratory: Negative.  Negative for chest tightness, shortness of breath and wheezing.   Cardiovascular: Negative.  Negative for chest pain and palpitations.  Gastrointestinal: Negative.   Musculoskeletal: Negative.   Skin: Negative.   Allergic/Immunologic: Negative.   Neurological: Negative.   Hematological: Negative.   All other  systems reviewed and are negative.     Objective:    Physical Exam Vitals and nursing note reviewed.  Constitutional:      Appearance: Normal appearance.  HENT:     Right Ear: Tympanic membrane normal.     Left Ear: Tympanic membrane and ear canal normal.  Cardiovascular:     Rate and Rhythm: Normal rate. Rhythm irregular.     Comments: PACs Pulmonary:     Effort: Pulmonary effort is normal.     Breath sounds: Normal breath sounds.  Abdominal:     General: Abdomen is flat.     Palpations: Abdomen is soft.     Tenderness: There is no rebound.  Musculoskeletal:        General: Normal range of motion.     Cervical back: Normal range of motion and neck supple.  Skin:    General: Skin is warm and dry.  Neurological:     General: No focal deficit present.     Mental Status: He is alert and oriented to person, place, and  time.  Psychiatric:        Mood and Affect: Mood normal.        Behavior: Behavior normal.    BP (!) 156/78   Pulse 61   Temp 98.1 F (36.7 C) (Temporal)   Ht 5\' 10"  (1.778 m)   Wt 198 lb (89.8 kg)   SpO2 96%   BMI 28.41 kg/m  Wt Readings from Last 3 Encounters:  07/22/20 198 lb (89.8 kg)  04/28/20 203 lb (92.1 kg)  10/15/19 206 lb 9.6 oz (93.7 kg)    Health Maintenance Due  Topic Date Due   Zoster Vaccines- Shingrix (1 of 2) Never done   COLONOSCOPY (Pts 45-39yrs Insurance coverage will need to be confirmed)  10/09/2014   TETANUS/TDAP  05/01/2017    There are no preventive care reminders to display for this patient.   Lab Results  Component Value Date   TSH 1.09 05/02/2007   Lab Results  Component Value Date   WBC 5.1 10/15/2019   HGB 14.8 10/15/2019   HCT 42.8 10/15/2019   MCV 95.5 10/15/2019   PLT 224 10/15/2019   Lab Results  Component Value Date   NA 139 10/15/2019   K 3.1 (L) 10/15/2019   CO2 30 10/15/2019   GLUCOSE 93 10/15/2019   BUN 15 10/15/2019   CREATININE 0.97 10/15/2019   BILITOT 0.9 10/15/2019   ALKPHOS 100 10/10/2018   AST 15 10/15/2019   ALT 10 10/15/2019   PROT 6.8 10/15/2019   ALBUMIN 4.1 10/10/2018   CALCIUM 9.0 10/15/2019   ANIONGAP 12 10/15/2013   GFR 79.43 10/10/2018   Lab Results  Component Value Date   CHOL 116 10/15/2019   Lab Results  Component Value Date   HDL 38 (L) 10/15/2019   Lab Results  Component Value Date   LDLCALC 65 10/15/2019   Lab Results  Component Value Date   TRIG 53 10/15/2019   Lab Results  Component Value Date   CHOLHDL 3.1 10/15/2019   Lab Results  Component Value Date   HGBA1C 5.4 10/15/2019       Assessment & Plan:   Problem List Items Addressed This Visit   None Visit Diagnoses     Raspy voice    -  Primary   Irregular heart rhythm       Relevant Orders   EKG 12-Lead (Completed)   Seasonal allergies  Premature atrial contraction       Relevant Orders   Ambulatory  referral to Cardiology        Meds ordered this encounter  Medications   predniSONE (STERAPRED UNI-PAK 21 TAB) 10 MG (21) TBPK tablet    Sig: As directed    Dispense:  21 tablet    Refill:  0    Call the office with any questions or concerns. Cardiology referral placed as a precaution. Stable. Prednisone as prescribed.   Kennyth Arnold, FNP

## 2020-07-24 NOTE — Telephone Encounter (Signed)
I spoke with the pt to give message below. Pt voiced understanding and has no questions or concerns. He is aware to allow 7-10 business days to be contacted for scheduling.

## 2020-07-30 ENCOUNTER — Other Ambulatory Visit: Payer: Self-pay

## 2020-07-30 ENCOUNTER — Ambulatory Visit (INDEPENDENT_AMBULATORY_CARE_PROVIDER_SITE_OTHER): Payer: Medicare Other | Admitting: Physician Assistant

## 2020-07-30 VITALS — BP 131/72 | HR 58 | Temp 98.1°F | Ht 70.0 in | Wt 195.0 lb

## 2020-07-30 DIAGNOSIS — J040 Acute laryngitis: Secondary | ICD-10-CM

## 2020-07-30 DIAGNOSIS — F17210 Nicotine dependence, cigarettes, uncomplicated: Secondary | ICD-10-CM | POA: Diagnosis not present

## 2020-07-30 NOTE — Patient Instructions (Addendum)
You have acute laryngitis, which may last up to 3 weeks. VOICE REST is going to be the best treatment. I also recommend daily humidifier, tea with honey and lemon, as well as Claritin 10 mg daily.  If this does not resolve, we will need to refer to ENT. Please call if this is the case.  For smoking cessation, you may try the Nicotine patch, which you do not need a prescription for. Discuss with pharmacist about the best strength for you based on the amount of cigarettes you smoke.

## 2020-07-30 NOTE — Progress Notes (Signed)
Acute Office Visit  Subjective:    Patient ID: Joe Hill, male    DOB: November 03, 1940, 80 y.o.   MRN: [id number]  Chief Complaint  Patient presents with   Hoarse    HPI Patient is in today for hoarse voice x 1 week after working outside mowing grass. He came in for a visit on 6-28 and was given a steroid pak, which he says did not help. He has also tried Robitussin, Sudafed, Mucinex DM, Cepacol, warm salt water. Does not take any allergy medication.   No history of losing his voice. Current every day smoker about 1/2 ppd.   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   TOBACCO USE 01/30/2007    Past Surgical History:  Procedure Laterality Date   APPENDECTOMY     CATARACT EXTRACTION     PROSTATE SURGERY     prostatectomy    Family History  Problem Relation Age of Onset   Hyperlipidemia Mother    Cancer Father        lung, smoker   Cancer Sister        breast, smoker   Arthritis Maternal Aunt     Social History   Socioeconomic History   Marital status: Single    Spouse name: Not on file   Number of children: Not on file   Years of education: Not on file   Highest education level: Not on file  Occupational History   Not on file  Tobacco Use   Smoking status: Every Day    Packs/day: 0.50    Pack years: 0.00    Types: Cigarettes   Smokeless tobacco: Never  Substance and Sexual Activity   Alcohol use: No    Alcohol/week: 0.0 standard drinks   Drug use: No   Sexual activity: Not on file  Other Topics Concern   Not on file  Social History Narrative   Divorced. 2 sons. 5 grandkids- 2 in college, 3 in hs in 2021.       Worked in [employer]. 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       HObbies: woodworking, taking care of yard, very active at work   Social Determinants of Radio broadcast assistant Strain: Not on file  Food Insecurity: Not on file  Transportation Needs: Not on file  Physical Activity: Not on file  Stress: Not on file  Social Connections: Not on file  Intimate Partner Violence: Not on file    Outpatient Medications Prior to Visit  Medication Sig Dispense Refill   amLODipine (NORVASC) 5 MG tablet TAKE 1 TABLET BY MOUTH EVERY DAY 90 tablet 3   predniSONE (STERAPRED UNI-PAK 21 TAB) 10 MG (21) TBPK tablet As directed 21 tablet 0   No facility-administered medications prior to visit.    Allergies  Allergen Reactions   Penicillins Hives    Review of Systems  Constitutional:  Negative for activity change, chills and fever.  HENT:  Positive for voice change. Negative for congestion, ear discharge, ear pain, rhinorrhea, sinus pressure, sinus pain, sneezing, sore throat and trouble swallowing.   Respiratory:  Negative for cough and shortness of breath.   Gastrointestinal:  Negative for nausea.  Skin:  Negative for rash.      Objective:    Physical Exam Vitals and nursing note reviewed.  Constitutional:      Appearance: Normal appearance.  HENT:     Right Ear: Tympanic membrane and ear canal normal.     Left Ear: Tympanic membrane and ear canal normal.     Mouth/Throat:     Mouth: Mucous membranes are moist.     Pharynx: Oropharynx is clear. No oropharyngeal exudate or posterior oropharyngeal erythema.     Comments: Raspy voice, but still able to vocalize  Eyes:     Extraocular Movements: Extraocular movements intact.     Conjunctiva/sclera: Conjunctivae normal.     Pupils: Pupils are equal, round, and reactive to light.  Pulmonary:     Effort: Pulmonary effort is normal.     Breath sounds: Normal breath sounds.  Neurological:     Mental Status: He is alert.    BP 131/72   Pulse (!) 58   Temp 98.1 F (36.7  C)   Ht 5\' 10"  (1.778 m)   Wt 195 lb (88.5 kg)   SpO2 98%   BMI 27.98 kg/m  Wt Readings from Last 3 Encounters:  07/30/20 195 lb (88.5 kg)  07/22/20 198 lb (89.8 kg)  04/28/20 203 lb (92.1 kg)    Health Maintenance Due  Topic Date Due   Zoster Vaccines- Shingrix (1 of 2) Never done   COLONOSCOPY (Pts 45-44yrs Insurance coverage will need to be confirmed)  10/09/2014   TETANUS/TDAP  05/01/2017    There are no preventive care reminders to display for this patient.   Lab Results  Component Value Date   TSH 1.09 05/02/2007   Lab Results  Component Value Date   WBC 5.1 10/15/2019   HGB 14.8 10/15/2019   HCT 42.8 10/15/2019   MCV 95.5 10/15/2019   PLT 224 10/15/2019   Lab Results  Component Value Date   NA 139 10/15/2019   K 3.1 (L) 10/15/2019   CO2 30 10/15/2019   GLUCOSE 93 10/15/2019   BUN 15 10/15/2019   CREATININE 0.97 10/15/2019   BILITOT 0.9 10/15/2019   ALKPHOS 100 10/10/2018   AST 15 10/15/2019   ALT 10 10/15/2019   PROT 6.8 10/15/2019   ALBUMIN 4.1 10/10/2018   CALCIUM 9.0 10/15/2019   ANIONGAP 12 10/15/2013   GFR 79.43 10/10/2018   Lab Results  Component Value Date   CHOL 116 10/15/2019   Lab Results  Component Value Date   HDL 38 (L) 10/15/2019   Lab Results  Component Value Date   LDLCALC 65 10/15/2019   Lab Results  Component Value Date   TRIG 53 10/15/2019   Lab Results  Component Value Date   CHOLHDL 3.1 10/15/2019   Lab Results  Component Value Date   HGBA1C 5.4 10/15/2019       Assessment & Plan:   Problem List Items Addressed This Visit   None   1. Acute laryngitis Reassured. Supportive care at this time, see AVS2. ENT if >3 weeks. Antibiotics not indicated and patient understanding.   2. Cigarette nicotine dependence without complication He expressed interest in quitting. He is going to look into the patches.   Margery Szostak M Herschell Virani, PA-C

## 2020-08-04 DIAGNOSIS — U071 COVID-19: Secondary | ICD-10-CM | POA: Diagnosis not present

## 2020-08-08 ENCOUNTER — Encounter: Payer: Self-pay | Admitting: Physician Assistant

## 2020-08-08 ENCOUNTER — Other Ambulatory Visit: Payer: Self-pay

## 2020-08-08 ENCOUNTER — Ambulatory Visit (INDEPENDENT_AMBULATORY_CARE_PROVIDER_SITE_OTHER): Payer: Medicare Other | Admitting: Physician Assistant

## 2020-08-08 VITALS — BP 113/55 | HR 68 | Temp 98.1°F | Ht 70.0 in | Wt 195.2 lb

## 2020-08-08 DIAGNOSIS — F17210 Nicotine dependence, cigarettes, uncomplicated: Secondary | ICD-10-CM | POA: Diagnosis not present

## 2020-08-08 DIAGNOSIS — R490 Dysphonia: Secondary | ICD-10-CM

## 2020-08-08 NOTE — Progress Notes (Signed)
Acute Office Visit  Subjective:    Patient ID: Joe Hill, male    DOB: [birth year], 80 y.o.   MRN: [id number]  Chief Complaint  Patient presents with   Hoarse    HPI  08-08-20: Recheck from previous description listed below... states no change / no improvement at all:   07-30-20: Patient is in today for hoarse voice x 1 week after working outside mowing grass. He came in for a visit on 6-28 and was given a steroid pak, which he says did not help. He has also tried Robitussin, Sudafed, Mucinex DM, Cepacol, warm salt water. Does not take any allergy medication.   No history of losing his voice. Current every day smoker about 1/2 ppd.   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   TOBACCO USE 01/30/2007    Past Surgical History:  Procedure Laterality Date   APPENDECTOMY     CATARACT EXTRACTION     PROSTATE SURGERY     prostatectomy    Family History  Problem Relation Age of Onset   Hyperlipidemia Mother    Cancer Father        lung, smoker   Cancer Sister        breast, smoker   Arthritis Maternal Aunt     Social History   Socioeconomic History   Marital status: Single    Spouse name: Not on file   Number of children: Not on file   Years of education: Not on file   Highest education level: Not on file  Occupational History   Not on file  Tobacco Use   Smoking status: Every Day    Packs/day: 0.50    Types: Cigarettes   Smokeless tobacco: Never  Substance and Sexual Activity   Alcohol use: No    Alcohol/week: 0.0 standard drinks   Drug use: No   Sexual activity: Not on file  Other Topics Concern   Not on file  Social History Narrative   Divorced. 2 sons. 5 grandkids- 2 in college, 3 in hs in 2021.        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       HObbies: woodworking, taking care of yard, very active at work   Social Determinants of Radio broadcast assistant Strain: Not on file  Food Insecurity: Not on file  Transportation Needs: Not on file  Physical Activity: Not on file  Stress: Not on file  Social Connections: Not on file  Intimate Partner Violence: Not on file    Outpatient Medications Prior to Visit  Medication Sig Dispense Refill   amLODipine (NORVASC) 5 MG tablet TAKE 1 TABLET BY MOUTH EVERY DAY 90 tablet 3   predniSONE (STERAPRED UNI-PAK 21 TAB) 10 MG (21) TBPK tablet As directed 21 tablet 0   No facility-administered medications prior to visit.    Allergies  Allergen Reactions   Penicillins Hives    Review of Systems  Constitutional:  Negative for activity change, chills and fever.  HENT:  Positive for voice change. Negative for congestion, ear discharge, ear pain, rhinorrhea, sinus pressure, sinus pain, sneezing, sore throat and trouble swallowing.   Respiratory:  Negative  for cough and shortness of breath.   Gastrointestinal:  Negative for nausea.  Skin:  Negative for rash.      Objective:    Physical Exam Vitals and nursing note reviewed.  Constitutional:      Appearance: Normal appearance.  HENT:     Right Ear: Tympanic membrane and ear canal normal.     Left Ear: Tympanic membrane and ear canal normal.     Mouth/Throat:     Mouth: Mucous membranes are moist.     Pharynx: Oropharynx is clear. No oropharyngeal exudate or posterior oropharyngeal erythema.     Comments: Raspy voice, but still able to vocalize  Eyes:     Extraocular Movements: Extraocular movements intact.     Conjunctiva/sclera: Conjunctivae normal.     Pupils: Pupils are equal, round, and reactive to light.  Pulmonary:     Effort: Pulmonary effort is normal.     Breath sounds: Normal breath sounds.   Neurological:     Mental Status: He is alert.    BP (!) 113/55   Pulse 68   Temp 98.1 F (36.7 C)   Ht 5\' 10"  (1.778 m)   Wt 195 lb 4 oz (88.6 kg)   SpO2 97%   BMI 28.02 kg/m  Wt Readings from Last 3 Encounters:  08/08/20 195 lb 4 oz (88.6 kg)  07/30/20 195 lb (88.5 kg)  07/22/20 198 lb (89.8 kg)    Health Maintenance Due  Topic Date Due   Zoster Vaccines- Shingrix (1 of 2) Never done   COLONOSCOPY (Pts 45-34yrs Insurance coverage will need to be confirmed)  10/09/2014   TETANUS/TDAP  05/01/2017   COVID-19 Vaccine (3 - Booster for [place] series) 09/16/2019    There are no preventive care reminders to display for this patient.   Lab Results  Component Value Date   TSH 1.09 05/02/2007   Lab Results  Component Value Date   WBC 5.1 10/15/2019   HGB 14.8 10/15/2019   HCT 42.8 10/15/2019   MCV 95.5 10/15/2019   PLT 224 10/15/2019   Lab Results  Component Value Date   NA 139 10/15/2019   K 3.1 (L) 10/15/2019   CO2 30 10/15/2019   GLUCOSE 93 10/15/2019   BUN 15 10/15/2019   CREATININE 0.97 10/15/2019   BILITOT 0.9 10/15/2019   ALKPHOS 100 10/10/2018   AST 15 10/15/2019   ALT 10 10/15/2019   PROT 6.8 10/15/2019   ALBUMIN 4.1 10/10/2018   CALCIUM 9.0 10/15/2019   ANIONGAP 12 10/15/2013   GFR 79.43 10/10/2018   Lab Results  Component Value Date   CHOL 116 10/15/2019   Lab Results  Component Value Date   HDL 38 (L) 10/15/2019   Lab Results  Component Value Date   LDLCALC 65 10/15/2019   Lab Results  Component Value Date   TRIG 53 10/15/2019   Lab Results  Component Value Date   CHOLHDL 3.1 10/15/2019   Lab Results  Component Value Date   HGBA1C 5.4 10/15/2019       Assessment & Plan:   Problem List Items Addressed This Visit   None   1. Acute laryngitis No improvement. >3 weeks. Referral to ENT at this time.  Antibiotics not indicated and patient understanding.   2. Cigarette nicotine dependence without  complication CONGRATULATED PATIENT down to 1-2 cigs/day! Using the patch.    Madelline Eshbach M Abdirahman Chittum, PA-C

## 2020-08-08 NOTE — Patient Instructions (Signed)
Someone will call about referral to ENT Continue voice rest! Doristine Devoid work cutting down smoking!!

## 2020-08-28 ENCOUNTER — Other Ambulatory Visit: Payer: Self-pay

## 2020-08-28 ENCOUNTER — Ambulatory Visit (INDEPENDENT_AMBULATORY_CARE_PROVIDER_SITE_OTHER): Payer: Medicare Other | Admitting: Otolaryngology

## 2020-08-28 DIAGNOSIS — J3801 Paralysis of vocal cords and larynx, unilateral: Secondary | ICD-10-CM

## 2020-08-28 DIAGNOSIS — R490 Dysphonia: Secondary | ICD-10-CM

## 2020-08-28 NOTE — Progress Notes (Signed)
HPI: Joe Hill is a 80 y.o. male who presents is referred by his PCP Dr. Yong Channel for evaluation of hoarseness that he has had now for about 6 weeks.  Denies any sore throat.  He does smoke less than a pack a day for over 60 years. On discussion with the patient in the office today he has a very breathy voice and weak voice..  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   TOBACCO USE 01/30/2007   Past Surgical History:  Procedure Laterality Date   APPENDECTOMY     CATARACT EXTRACTION     PROSTATE SURGERY     prostatectomy   Social History   Socioeconomic History   Marital status: Single    Spouse name: Not on file   Number of children: Not on file   Years of education: Not on file   Highest education level: Not on file  Occupational History   Not on file  Tobacco Use   Smoking status: Every Day    Packs/day: 0.50    Types: Cigarettes   Smokeless tobacco: Never  Substance and Sexual Activity   Alcohol use: No    Alcohol/week: 0.0 standard drinks   Drug use: No   Sexual activity: Not on file  Other Topics Concern   Not on file  Social History Narrative   Divorced. 2 sons. 5 grandkids- 2 in college, 3 in hs in 2021.       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       HObbies: woodworking, taking care of yard, very active at work   Social Determinants of Radio broadcast assistant Strain: Not on file  Food Insecurity: Not on file  Transportation Needs: Not on file  Physical Activity: Not on file  Stress: Not on file  Social Connections: Not on file   Family History  Problem Relation Age of Onset   Hyperlipidemia Mother    Cancer Father         lung, smoker   Cancer Sister        breast, smoker   Arthritis Maternal Aunt    Allergies  Allergen Reactions   Penicillins Hives   Prior to Admission medications   Medication Sig Start Date End Date Taking? Authorizing Provider  amLODipine (NORVASC) 5 MG tablet TAKE 1 TABLET BY MOUTH EVERY DAY 09/03/19   [name], MD     Positive ROS: Otherwise negative  All other systems have been reviewed and were otherwise negative with the exception of those mentioned in the HPI and as above.  Physical Exam: Constitutional: Alert, well-appearing, no acute distress.  Patient with a weak breathy voice. Ears: External ears without lesions or tenderness. Ear canals are clear bilaterally with intact, clear TMs.  Nasal: External nose without lesions. Septum with minimal deformity and mild rhinitis.. Clear nasal passages bilaterally. Oral: Lips and gums without lesions. Tongue and palate mucosa without lesions. Posterior oropharynx clear.  Indirect laryngoscopy revealed a clear base of tongue vallecula epiglottis.  I could barely visualize the vocal cords with what appears to be a left vocal cord paralysis. Fiberoptic laryngoscopy through the left nostril revealed a clear nasopharynx, vallecula  base of tongue and epiglottis were normal.  On evaluation of vocal cords both vocal cords were clear with normal mobility of the right true vocal cord and a paralyzed left true vocal cord with no significant mobility. Neck: No palpable adenopathy or masses.  Do not appreciate any thyroid nodules or jugular lymph nodes.  No supraclavicular adenopathy. Respiratory: Breathing comfortably  Skin: No facial/neck lesions or rash noted.  Laryngoscopy  Date/Time: 08/28/2020 2:51 PM Performed by: Rozetta Nunnery, MD Authorized by: Rozetta Nunnery, MD   Consent:    Consent obtained:  Verbal   Consent given by:  Patient Procedure details:    Indications: hoarseness, dysphagia, or aspiration      Medication:  Afrin   Instrument: flexible fiberoptic laryngoscope     Scope location: left nare   Sinus:    Left nasopharynx: normal   Mouth:    Oropharynx: normal     Vallecula: normal     Base of tongue: normal     Epiglottis: normal   Throat:    True vocal cords: normal     reduced mobility   Comments:     On fiberoptic laryngoscopy patient has paralysis of the left true vocal cord with no significant mobility.  There are no vocal cord lesions noted.  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 Patient will call us concerning results of the CT scan after this is performed.   Radene Journey, MD   CC:

## 2020-08-31 ENCOUNTER — Other Ambulatory Visit: Payer: Self-pay | Admitting: Family Medicine

## 2020-09-02 ENCOUNTER — Telehealth (INDEPENDENT_AMBULATORY_CARE_PROVIDER_SITE_OTHER): Payer: Self-pay | Admitting: Otolaryngology

## 2020-09-02 ENCOUNTER — Other Ambulatory Visit (INDEPENDENT_AMBULATORY_CARE_PROVIDER_SITE_OTHER): Payer: Self-pay | Admitting: Otolaryngology

## 2020-09-02 ENCOUNTER — Other Ambulatory Visit: Payer: Self-pay | Admitting: Otolaryngology

## 2020-09-02 DIAGNOSIS — J3800 Paralysis of vocal cords and larynx, unspecified: Secondary | ICD-10-CM

## 2020-09-02 DIAGNOSIS — J3801 Paralysis of vocal cords and larynx, unilateral: Secondary | ICD-10-CM

## 2020-09-02 NOTE — Telephone Encounter (Signed)
Called Joe Hill concerning scheduling his CT scan of the neck and chest to evaluate his left vocal cord paralysis.  The first scheduled appointment availability was over 2 weeks so I made this a stat CT scan and he can show up [facility] [address] as a walk-in before 3 PM today to have the CT scan of his neck and chest performed.

## 2020-09-03 ENCOUNTER — Ambulatory Visit
Admission: RE | Admit: 2020-09-03 | Discharge: 2020-09-03 | Disposition: A | Discharge status: Home / Self Care | Payer: Medicare Other | Source: Ambulatory Visit | Attending: Otolaryngology | Admitting: Otolaryngology

## 2020-09-03 DIAGNOSIS — J3800 Paralysis of vocal cords and larynx, unspecified: Secondary | ICD-10-CM

## 2020-09-03 DIAGNOSIS — R490 Dysphonia: Secondary | ICD-10-CM | POA: Diagnosis not present

## 2020-09-03 DIAGNOSIS — Z8546 Personal history of malignant neoplasm of prostate: Secondary | ICD-10-CM | POA: Diagnosis not present

## 2020-09-03 DIAGNOSIS — J387 Other diseases of larynx: Secondary | ICD-10-CM | POA: Diagnosis not present

## 2020-09-03 DIAGNOSIS — J040 Acute laryngitis: Secondary | ICD-10-CM | POA: Diagnosis not present

## 2020-09-03 DIAGNOSIS — G839 Paralytic syndrome, unspecified: Secondary | ICD-10-CM | POA: Diagnosis not present

## 2020-09-03 DIAGNOSIS — J432 Centrilobular emphysema: Secondary | ICD-10-CM | POA: Diagnosis not present

## 2020-09-03 MED ORDER — IOPAMIDOL (ISOVUE-300) INJECTION 61%
75.0000 mL | Freq: Once | INTRAVENOUS | Status: AC | PRN
  Administered 2020-09-03: 75 mL via INTRAVENOUS

## 2020-09-15 ENCOUNTER — Telehealth: Payer: Self-pay

## 2020-09-15 NOTE — Telephone Encounter (Signed)
Please work pt in sooner per Dr. Ronney Lion message below.

## 2020-09-15 NOTE — Telephone Encounter (Signed)
Patient is requesting a full panel of lab work in order to see if he has cancer he  Please place orders or call patient and advise patient does have an upcoming apt in October   Please be aware when calling patient he does have paralyzed voice cord and you can barley hear patient speak just be mindful. Thank you

## 2020-09-15 NOTE — Telephone Encounter (Signed)
Patient scheduled for 9/12

## 2020-09-15 NOTE — Telephone Encounter (Signed)
Is this ok or should this blood work be done at his visit in Oct?

## 2020-09-15 NOTE — Telephone Encounter (Signed)
There is not a standard panel to assess for cancer. There are basic screening tests that are typically age based (he has aged out of most of these) as well as some symptom based ones- I would like for you to schedule him a sooner visit so we can discuss symptoms and his unique risks to further evaluate

## 2020-09-17 ENCOUNTER — Ambulatory Visit (INDEPENDENT_AMBULATORY_CARE_PROVIDER_SITE_OTHER): Payer: Medicare Other | Admitting: Otolaryngology

## 2020-09-17 ENCOUNTER — Other Ambulatory Visit: Payer: Self-pay

## 2020-09-17 DIAGNOSIS — R490 Dysphonia: Secondary | ICD-10-CM | POA: Diagnosis not present

## 2020-09-17 DIAGNOSIS — J3801 Paralysis of vocal cords and larynx, unilateral: Secondary | ICD-10-CM | POA: Diagnosis not present

## 2020-09-17 NOTE — Progress Notes (Signed)
HPI: Joe Hill is a 80 y.o. male who returns today for evaluation of recent CT scan of the chest and neck.  On last visit patient has been hoarse for over a month and on exam he had a paralyzed left vocal cord and subsequent underwent a CT scan of his neck and chest.  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  Patient has an appointment to see Dr. Roxan Hockey next week for further treatment.[name] He is able to eat and swallow without too much difficulty.  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   TOBACCO USE 01/30/2007   Past Surgical History:  Procedure Laterality Date   APPENDECTOMY     CATARACT EXTRACTION     PROSTATE SURGERY     prostatectomy   Social History   Socioeconomic History   Marital status: Single    Spouse name: Not on file   Number of children: Not on file   Years of education: Not on file   Highest education level: Not on file  Occupational History   Not on file  Tobacco Use   Smoking status: Every Day    Packs/day: 0.50    Types: Cigarettes   Smokeless tobacco: Never  Substance and Sexual Activity   Alcohol use: No    Alcohol/week: 0.0 standard drinks   Drug use: No   Sexual activity: Not on file  Other Topics Concern   Not on file  Social History Narrative   Divorced. 2 sons. 5 grandkids- 2 in college, 3 in hs in 2021.       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       HObbies: woodworking, taking care of yard, very  active at work   Social Determinants of Radio broadcast assistant Strain: Not on file  Food Insecurity: Not on file  Transportation Needs: Not on file  Physical Activity: Not on file  Stress: Not on file  Social Connections: Not on file   Family History  Problem Relation Age of Onset   Hyperlipidemia Mother    Cancer Father        lung, smoker   Cancer Sister        breast, smoker   Arthritis Maternal Aunt    Allergies  Allergen Reactions   Penicillins Hives   Prior to Admission medications   Medication Sig Start Date End Date Taking? Authorizing Provider  amLODipine (NORVASC) 5 MG tablet TAKE 1 TABLET BY MOUTH EVERY DAY 09/03/20   [name], MD     Positive ROS: Otherwise negative  All other systems have been reviewed and were otherwise negative with the exception of those mentioned in the HPI and as above.  Physical Exam: Constitutional: Alert, well-appearing, no acute distress.  Patient has a very breathy voice. Ears: External ears without lesions or tenderness. Ear canals are clear bilaterally with intact, clear TMs.  Nasal: External nose without lesions.. Clear nasal passages Oral:  Lips and gums without lesions. Tongue and palate mucosa without lesions. Posterior oropharynx clear. Neck: No palpable adenopathy or masses Respiratory: Breathing comfortably  Skin: No facial/neck lesions or rash noted.  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  Plan: Reviewed the CT scan with the patient and his son in the office today. He has an appointment to meet with Dr. Roxan Hockey next week.   Radene Journey, MD

## 2020-09-24 ENCOUNTER — Encounter: Payer: Medicare Other | Admitting: Thoracic Surgery (Cardiothoracic Vascular Surgery)

## 2020-09-25 ENCOUNTER — Institutional Professional Consult (permissible substitution) (INDEPENDENT_AMBULATORY_CARE_PROVIDER_SITE_OTHER): Payer: Medicare Other | Admitting: Thoracic Surgery (Cardiothoracic Vascular Surgery)

## 2020-09-25 ENCOUNTER — Other Ambulatory Visit: Payer: Self-pay

## 2020-09-25 ENCOUNTER — Other Ambulatory Visit: Payer: Self-pay | Admitting: *Deleted

## 2020-09-25 VITALS — BP 135/83 | HR 84 | Resp 20 | Ht 70.0 in | Wt 188.0 lb

## 2020-09-25 DIAGNOSIS — R918 Other nonspecific abnormal finding of lung field: Secondary | ICD-10-CM

## 2020-09-25 DIAGNOSIS — C7931 Secondary malignant neoplasm of brain: Secondary | ICD-10-CM

## 2020-09-25 DIAGNOSIS — C3490 Malignant neoplasm of unspecified part of unspecified bronchus or lung: Secondary | ICD-10-CM

## 2020-09-25 DIAGNOSIS — R911 Solitary pulmonary nodule: Secondary | ICD-10-CM

## 2020-09-25 NOTE — Progress Notes (Signed)
PCP is Joe Olp, MD Referring Provider is Joe Hill, *  Chief Complaint  Patient presents with   Lung Lesion    Initial surgical consult, CT chest 8/10    HPI: [name] is sent for consultation regarding a left upper lobe lung mass with mediastinal adenopathy.  Joe Hill is an 80 year old man with history of tobacco abuse, COPD hypertension, hyperglycemia, prostate cancer, and reflux.  He also has a history of bilateral adrenal masses noted in 2011.  He was in his usual state of health until June.  He woke up on Monday morning noticed his voice is very raspy.  He has developed progressive hoarseness from that.  He was referred to Dr. Lucia Hill of ENT.  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  CT of the neck and chest showed a left apical lung mass with mediastinal adenopathy.  For the most part he is quit smoking.  Still smoking 1 to 2 cigarettes a day.  He says he smokes when he gets bored.  He denies any chest pain, pressure, or tightness.  No hemoptysis.  He does have a cough but that has not changed recently.  No change in appetite or weight loss.  No shoulder or arm pain but has noticed some neck pain when he looks down.  Zubrod Score: At the time of surgery this patient's most appropriate activity status/level should be described as: [x]     0    Normal activity, no symptoms []     1    Restricted in physical strenuous activity but ambulatory, able to do out light work []     2    Ambulatory and capable of self care, unable to do work activities, up and about >50 % of waking hours                              []     3    Only limited self care, in bed greater than 50% of waking hours []     4    Completely disabled, no self care, confined to bed or chair []     5    Moribund  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   COLONIC POLYPS, HX OF 10/11/2006   COPD 03/31/2009   EMPHYSEMA 10/16/2009   GERD 09/09/2009   HEMOPTYSIS UNSPECIFIED  07/30/2009   HYPERGLYCEMIA 08/12/2009   HYPERTENSION 08/04/2007   PROSTATE CANCER, 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   TOBACCO USE 01/30/2007    Past Surgical History:  Procedure Laterality Date   APPENDECTOMY     CATARACT EXTRACTION     PROSTATE SURGERY     prostatectomy    Family History  Problem Relation Age of Onset   Hyperlipidemia Mother    Cancer Father        lung, smoker   Cancer Sister        breast, smoker   Arthritis Maternal Aunt     Social History Social History   Tobacco Use   Smoking status: Every Day    Packs/day: 0.50    Types: Cigarettes   Smokeless tobacco: Never  Substance Use Topics   Alcohol use: No    Alcohol/week: 0.0 standard drinks   Drug use: No    Current Outpatient Medications  Medication Sig Dispense Refill   amLODipine (NORVASC) 5 MG tablet TAKE 1 TABLET BY MOUTH EVERY DAY 90 tablet 3   No current  facility-administered medications for this visit.    Allergies  Allergen Reactions   Penicillins Hives    Review of Systems  Constitutional:  Negative for activity change, fatigue and unexpected weight change.  HENT:  Positive for voice change. Negative for trouble swallowing.   Respiratory:  Positive for cough. Negative for shortness of breath and wheezing.   Cardiovascular:  Negative for chest pain and leg swelling.   BP 135/83 (BP Location: Right Arm, Patient Position: Sitting)   Pulse 84   Resp 20   Ht 5\' 10"  (1.778 m)   Wt 188 lb (85.3 kg)   SpO2 95% Comment: RA  BMI 26.98 kg/m  Physical Exam Vitals reviewed.  Constitutional:      General: He is not in acute distress.    Appearance: Normal appearance.  HENT:     Head: Normocephalic and atraumatic.  Eyes:     General: No scleral icterus.    Extraocular Movements: Extraocular movements intact.  Cardiovascular:     Rate and Rhythm: Normal rate and regular rhythm.     Heart sounds: Normal heart sounds. No murmur heard. Pulmonary:     Effort: No respiratory distress.     Breath sounds:  Normal breath sounds. No wheezing or rales.  Abdominal:     General: There is no distension.     Palpations: Abdomen is soft.  Musculoskeletal:     Cervical back: Neck supple.  Lymphadenopathy:     Cervical: No cervical adenopathy.  Skin:    General: Skin is warm and dry.  Neurological:     General: No focal deficit present.     Mental Status: He is alert and oriented to person, place, and time.     Cranial Nerves: No cranial nerve deficit.     Motor: No weakness.     Diagnostic Tests: CT CHEST WITH CONTRAST   TECHNIQUE: Multidetector CT imaging of the chest was performed during intravenous contrast administration.   CONTRAST:  96mL ISOVUE-300 IOPAMIDOL (ISOVUE-300) INJECTION 61%   COMPARISON:  10/15/2013   FINDINGS: Cardiovascular: Coronary, aortic arch, and branch vessel atherosclerotic vascular disease.   Mediastinum/Nodes: A medial left upper lobe mass invades the mediastinum in the vicinity of the AP window, with the mass in the region of the mediastinal invasion measuring 4.0 by 3.1 cm on image 67 series 4, and located about 1.9 cm from the trachea and about 3 cm from the carina.   Small right paratracheal lymph nodes are present. Right infrahilar node 0.8 cm in short axis, image 97 series 4. Pleural-based tumor versus left internal mammary chain lymph node 1.0 by 1.9 cm on image 51 series 4.   Lungs/Pleura: Marginally continuous with the lesion invading the AP window, a 3.2 by 2.0 by 2.6 cm medial left upper lobe mass is present abutting the pleural and mediastinal margins on image 57 series 9. Surrounding interstitial accentuation possibly reflecting mild surrounding lymphangitic tumor spread.   Biapical pleuroparenchymal scarring. Severe centrilobular emphysema. 3 mm subpleural nodule in the right upper lobe on image 37 series 7 is stable from 2015 and considered benign. Confluent nodularity anteriorly in the right upper lobe measuring about 1.1 by 0.6 cm  on image 56 series 7 is new and could be related to adjacent atelectasis, but a neoplastic pulmonary nodule is not readily excluded.   Airway thickening is present, suggesting bronchitis or reactive airways disease. Mild scarring or atelectasis in the lower lingula. Mild peripheral interstitial accentuation at the lung bases.   Upper  Abdomen: Small hypodense lesions in the dome of the right hepatic lobe and anteriorly in the left hepatic lobe are similar to the prior exam. A 0.7 cm hypodense lesion posteriorly in the left hepatic lobe on image 145 series 4 is not well seen previously and technically nonspecific although statistically likely to be benign. There calcifications along the gallbladder fossa presumably from gallstones in a gallbladder. Low-density nodular fullness of both adrenal glands was present previously and most likely from adrenal adenomas. Cystic lesions of the left kidney upper pole are present previously and most likely benign cysts. Single punctate bilateral calcifications along the renal hila could represent nonobstructive renal calculi.   Musculoskeletal: Thoracic spondylosis. Degenerative sternoclavicular arthropathy bilaterally.   IMPRESSION: 1. Suspected left lung malignancy with a central mass invading the mediastinum measuring 4.0 cm in long axis, tangential to a 3.2 cm medial left upper lobe mass abutting the mediastinal and anterior pleural surfaces. There is also a suspected tumor nodule along the left anterior upper pleura, versus an adjacent internal mammary lymph node. No significant pleural effusion at this time. 2. Nodularity along likely scarring in the right upper lobe 1.1 by 0.6 cm, cannot exclude neoplastic pulmonary nodule in this location. 3. Other imaging findings of potential clinical significance: Aortic Atherosclerosis (ICD10-I70.0) and Emphysema (ICD10-J43.9). Coronary atherosclerosis. Biapical scarring. Airway thickening is  present, suggesting bronchitis or reactive airways disease. Suspected gallstones. Bilateral nonobstructive nephrolithiasis. Low-density adrenal masses are similar to 9/20 1/15 and likely adenomas.     Electronically Signed   By: Joe Hill M.D.   On: 09/03/2020 12:34  I personally reviewed the CT images.  There is a left apical mass with significant AP window adenopathy and also an enlarged internal mammary node.  Emphysema noted as well.  Impression: Joe Hill is an 80 year old man with a history of tobacco abuse, COPD hypertension, hyperglycemia, prostate cancer, and reflux.  He presented with hoarseness and was found to have a paralyzed left vocal cord.  Work-up revealed a left apical mass with mediastinal adenopathy.  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  The apical mass does abut the chest wall.  He may have T3 disease.  He does not have any pain that is definitive for chest wall involvement.  He needs a PET/CT and MRI of the brain to complete his radiographic staging.  He then will need a biopsy for diagnostic and staging purposes.  I recommended we proceed with PET/CT first to make sure there is no other site that would be easier to biopsy.  If not, we would then proceed with navigational bronchoscopy and endobronchial ultrasound for diagnosis and staging.  I described the proposed procedure to Joe Hill and his son.  They understand we will do this in the operating room under general anesthesia.  It is an endoscopic procedure.  We would plan to do it as an outpatient.  I informed him of the indications, risks, benefits, and alternatives.  They understand the risks include, but not limited to bleeding, pneumothorax, failure to make a diagnosis, as well as those risks associated with general anesthesia.  Plan: PET/CT to guide initial diagnostic work-up of new left lung mass. MRI of the brain to rule out brain metastases.  Navigational  bronchoscopy and endobronchial ultrasound for diagnosis and staging.  We will call to schedule once we know when the PET/CT will be done.  Melrose Nakayama, MD [facility] [phone]

## 2020-10-06 ENCOUNTER — Ambulatory Visit: Payer: Medicare Other | Admitting: Family Medicine

## 2020-10-09 ENCOUNTER — Ambulatory Visit
Admission: RE | Admit: 2020-10-09 | Discharge: 2020-10-09 | Disposition: A | Discharge status: Home / Self Care | Payer: Medicare Other | Source: Ambulatory Visit | Attending: Thoracic Surgery (Cardiothoracic Vascular Surgery) | Admitting: Thoracic Surgery (Cardiothoracic Vascular Surgery)

## 2020-10-09 ENCOUNTER — Other Ambulatory Visit: Payer: Self-pay

## 2020-10-09 DIAGNOSIS — C3490 Malignant neoplasm of unspecified part of unspecified bronchus or lung: Secondary | ICD-10-CM

## 2020-10-09 DIAGNOSIS — G319 Degenerative disease of nervous system, unspecified: Secondary | ICD-10-CM | POA: Diagnosis not present

## 2020-10-09 DIAGNOSIS — Z85118 Personal history of other malignant neoplasm of bronchus and lung: Secondary | ICD-10-CM | POA: Diagnosis not present

## 2020-10-09 DIAGNOSIS — I6782 Cerebral ischemia: Secondary | ICD-10-CM | POA: Diagnosis not present

## 2020-10-09 DIAGNOSIS — C7931 Secondary malignant neoplasm of brain: Secondary | ICD-10-CM | POA: Diagnosis not present

## 2020-10-09 MED ORDER — GADOBENATE DIMEGLUMINE 529 MG/ML IV SOLN
18.0000 mL | Freq: Once | INTRAVENOUS | Status: AC | PRN
  Administered 2020-10-09: 18 mL via INTRAVENOUS

## 2020-10-13 ENCOUNTER — Telehealth: Payer: Self-pay | Admitting: Family Medicine

## 2020-10-13 NOTE — Telephone Encounter (Signed)
Copied from [place] [id number]. Topic: Medicare AWV >> Oct 13, 2020  9:45 AM Harris-Coley, Hannah Beat wrote: Reason for CRM: Left message for patient to schedule Annual Wellness Visit.  Please schedule with Nurse Health Advisor Charlott Rakes, RN at [facility].  Please call [phone] ask for [facility]

## 2020-10-15 ENCOUNTER — Ambulatory Visit (HOSPITAL_COMMUNITY)
Admission: RE | Admit: 2020-10-15 | Discharge: 2020-10-15 | Disposition: A | Discharge status: Home / Self Care | Payer: Medicare Other | Source: Ambulatory Visit | Attending: Thoracic Surgery (Cardiothoracic Vascular Surgery) | Admitting: Thoracic Surgery (Cardiothoracic Vascular Surgery)

## 2020-10-15 ENCOUNTER — Other Ambulatory Visit: Payer: Self-pay

## 2020-10-15 DIAGNOSIS — I700 Atherosclerosis of aorta: Secondary | ICD-10-CM | POA: Insufficient documentation

## 2020-10-15 DIAGNOSIS — C719 Malignant neoplasm of brain, unspecified: Secondary | ICD-10-CM | POA: Diagnosis not present

## 2020-10-15 DIAGNOSIS — C7972 Secondary malignant neoplasm of left adrenal gland: Secondary | ICD-10-CM | POA: Diagnosis not present

## 2020-10-15 DIAGNOSIS — J439 Emphysema, unspecified: Secondary | ICD-10-CM | POA: Insufficient documentation

## 2020-10-15 DIAGNOSIS — Z131 Encounter for screening for diabetes mellitus: Secondary | ICD-10-CM | POA: Insufficient documentation

## 2020-10-15 DIAGNOSIS — C781 Secondary malignant neoplasm of mediastinum: Secondary | ICD-10-CM | POA: Insufficient documentation

## 2020-10-15 DIAGNOSIS — K8020 Calculus of gallbladder without cholecystitis without obstruction: Secondary | ICD-10-CM | POA: Diagnosis not present

## 2020-10-15 DIAGNOSIS — R918 Other nonspecific abnormal finding of lung field: Secondary | ICD-10-CM | POA: Diagnosis not present

## 2020-10-15 DIAGNOSIS — I2510 Atherosclerotic heart disease of native coronary artery without angina pectoris: Secondary | ICD-10-CM | POA: Diagnosis not present

## 2020-10-15 DIAGNOSIS — I77811 Abdominal aortic ectasia: Secondary | ICD-10-CM | POA: Diagnosis not present

## 2020-10-15 LAB — GLUCOSE, CAPILLARY: Glucose-Capillary: 147 mg/dL — ABNORMAL HIGH (ref 70–99)

## 2020-10-15 MED ORDER — FLUDEOXYGLUCOSE F - 18 (FDG) INJECTION
9.3600 | Freq: Once | INTRAVENOUS | Status: AC
  Administered 2020-10-15: 9.36 via INTRAVENOUS

## 2020-10-17 ENCOUNTER — Encounter: Payer: Self-pay | Admitting: *Deleted

## 2020-10-17 ENCOUNTER — Other Ambulatory Visit: Payer: Self-pay | Admitting: *Deleted

## 2020-10-17 DIAGNOSIS — R918 Other nonspecific abnormal finding of lung field: Secondary | ICD-10-CM

## 2020-10-17 DIAGNOSIS — D499 Neoplasm of unspecified behavior of unspecified site: Secondary | ICD-10-CM

## 2020-10-27 NOTE — Pre-Procedure Instructions (Signed)
Surgical Instructions    Your procedure is scheduled on Thursday 10/30/20.   Report to [facility] Main Entrance "A" at 09:40 A.M., then check in with the Admitting office.  Call this number if you have problems the morning of surgery:  [phone]   If you have any questions prior to your surgery date call [phone]: Open Monday-Friday 8am-4pm    Remember:  Do not eat or drink after midnight the night before your surgery     Take these medicines the morning of surgery with A SIP OF WATER   amLODipine (NORVASC)  As of today, STOP taking any Aspirin (unless otherwise instructed by your surgeon) Aleve, Naproxen, Ibuprofen, Motrin, Advil, Goody's, BC's, all herbal medications, fish oil, and all vitamins.                     Do NOT Smoke (Tobacco/Vaping) or drink Alcohol 24 hours prior to your procedure.  If you use a CPAP at night, you may bring all equipment for your overnight stay.   Contacts, glasses, piercing's, hearing aid's, dentures or partials may not be worn into surgery, please bring cases for these belongings.    For patients admitted to the hospital, discharge time will be determined by your treatment team.   Patients discharged the day of surgery will not be allowed to drive home, and someone needs to stay with them for 24 hours.  NO VISITORS WILL BE ALLOWED IN PRE-OP WHERE PATIENTS GET READY FOR SURGERY.  ONLY 1 SUPPORT PERSON MAY BE PRESENT IN THE WAITING ROOM WHILE YOU ARE IN SURGERY.  IF YOU ARE TO BE ADMITTED, ONCE YOU ARE IN YOUR ROOM YOU WILL BE ALLOWED TWO (2) VISITORS.  Minor children may have two parents present. Special consideration for safety and communication needs will be reviewed on a case by case basis.   Special instructions:   [place]- Preparing For Surgery  Before surgery, you can play an important role. Because skin is not sterile, your skin needs to be as free of germs as possible. You can reduce the number of germs on your skin by washing  with CHG (chlorahexidine gluconate) Soap before surgery.  CHG is an antiseptic cleaner which kills germs and bonds with the skin to continue killing germs even after washing.    Oral Hygiene is also important to reduce your risk of infection.  Remember - BRUSH YOUR TEETH THE MORNING OF SURGERY WITH YOUR REGULAR TOOTHPASTE  Please do not use if you have an allergy to CHG or antibacterial soaps. If your skin becomes reddened/irritated stop using the CHG.  Do not shave (including legs and underarms) for at least 48 hours prior to first CHG shower. It is OK to shave your face.  Please follow these instructions carefully.   Shower the NIGHT BEFORE SURGERY and the MORNING OF SURGERY  If you chose to wash your hair, wash your hair first as usual with your normal shampoo.  After you shampoo, rinse your hair and body thoroughly to remove the shampoo.  Use CHG Soap as you would any other liquid soap. You can apply CHG directly to the skin and wash gently with a scrungie or a clean washcloth.   Apply the CHG Soap to your body ONLY FROM THE NECK DOWN.  Do not use on open wounds or open sores. Avoid contact with your eyes, ears, mouth and genitals (private parts). Wash Face and genitals (private parts)  with your normal soap.   Wash  thoroughly, paying special attention to the area where your surgery will be performed.  Thoroughly rinse your body with warm water from the neck down.  DO NOT shower/wash with your normal soap after using and rinsing off the CHG Soap.  Pat yourself dry with a CLEAN TOWEL.  Wear CLEAN PAJAMAS to bed the night before surgery  Place CLEAN SHEETS on your bed the night before your surgery  DO NOT SLEEP WITH PETS.   Day of Surgery: Shower with CHG soap. Do not wear jewelry, make up, nail polish, gel polish, artificial nails, or any other type of covering on natural nails including finger and toenails. If patients have artificial nails, gel coating, etc. that need to be  removed by a nail salon please have this removed prior to surgery. Surgery may need to be canceled/delayed if the surgeon/ anesthesia feels like the patient is unable to be adequately monitored. Do not wear lotions, powders, perfumes/colognes, or deodorant. Do not shave 48 hours prior to surgery.  Men may shave face and neck. Do not bring valuables to the hospital. [facility] is not responsible for any belongings or valuables. Wear Clean/Comfortable clothing the morning of surgery Remember to brush your teeth WITH YOUR REGULAR TOOTHPASTE.   Please read over the following fact sheets that you were given.   3 days prior to your procedure or After your COVID test   You are not required to quarantine however you are required to wear a well-fitting mask when you are out and around people not in your household. If your mask becomes wet or soiled, replace with a new one.   Wash your hands often with soap and water for 20 seconds or clean your hands with an alcohol-based hand sanitizer that contains at least 60% alcohol.   Do not share personal items.   Notify your provider:  o if you are in close contact with someone who has COVID  o or if you develop a fever of 100.4 or greater, sneezing, cough, sore throat, shortness of breath or body aches.

## 2020-10-28 ENCOUNTER — Encounter (HOSPITAL_COMMUNITY): Payer: Self-pay

## 2020-10-28 ENCOUNTER — Encounter (HOSPITAL_COMMUNITY)
Admission: RE | Admit: 2020-10-28 | Discharge: 2020-10-28 | Disposition: A | Discharge status: Home / Self Care | Payer: Medicare Other | Source: Ambulatory Visit | Attending: Thoracic Surgery (Cardiothoracic Vascular Surgery) | Admitting: Thoracic Surgery (Cardiothoracic Vascular Surgery)

## 2020-10-28 ENCOUNTER — Encounter: Payer: Self-pay | Admitting: Family Medicine

## 2020-10-28 ENCOUNTER — Ambulatory Visit (INDEPENDENT_AMBULATORY_CARE_PROVIDER_SITE_OTHER): Payer: Medicare Other | Admitting: Family Medicine

## 2020-10-28 ENCOUNTER — Other Ambulatory Visit: Payer: Self-pay

## 2020-10-28 ENCOUNTER — Ambulatory Visit (HOSPITAL_COMMUNITY)
Admission: RE | Admit: 2020-10-28 | Discharge: 2020-10-28 | Disposition: A | Discharge status: Home / Self Care | Payer: Medicare Other | Source: Ambulatory Visit | Attending: Thoracic Surgery (Cardiothoracic Vascular Surgery) | Admitting: Thoracic Surgery (Cardiothoracic Vascular Surgery)

## 2020-10-28 VITALS — BP 119/77 | HR 67 | Temp 99.2°F | Ht 70.0 in | Wt 187.6 lb

## 2020-10-28 DIAGNOSIS — I700 Atherosclerosis of aorta: Secondary | ICD-10-CM | POA: Diagnosis not present

## 2020-10-28 DIAGNOSIS — D499 Neoplasm of unspecified behavior of unspecified site: Secondary | ICD-10-CM

## 2020-10-28 DIAGNOSIS — E785 Hyperlipidemia, unspecified: Secondary | ICD-10-CM

## 2020-10-28 DIAGNOSIS — R918 Other nonspecific abnormal finding of lung field: Secondary | ICD-10-CM | POA: Diagnosis not present

## 2020-10-28 DIAGNOSIS — J438 Other emphysema: Secondary | ICD-10-CM | POA: Diagnosis not present

## 2020-10-28 DIAGNOSIS — Z01818 Encounter for other preprocedural examination: Secondary | ICD-10-CM

## 2020-10-28 DIAGNOSIS — J439 Emphysema, unspecified: Secondary | ICD-10-CM | POA: Diagnosis not present

## 2020-10-28 DIAGNOSIS — I10 Essential (primary) hypertension: Secondary | ICD-10-CM | POA: Diagnosis not present

## 2020-10-28 DIAGNOSIS — Z20822 Contact with and (suspected) exposure to covid-19: Secondary | ICD-10-CM | POA: Insufficient documentation

## 2020-10-28 DIAGNOSIS — J449 Chronic obstructive pulmonary disease, unspecified: Secondary | ICD-10-CM | POA: Diagnosis not present

## 2020-10-28 LAB — COMPREHENSIVE METABOLIC PANEL
ALT: 13 U/L (ref 0–44)
AST: 16 U/L (ref 15–41)
Albumin: 4.1 g/dL (ref 3.5–5.0)
Alkaline Phosphatase: 76 U/L (ref 38–126)
Anion gap: 7 (ref 5–15)
BUN: 13 mg/dL (ref 8–23)
CO2: 29 mmol/L (ref 22–32)
Calcium: 9.1 mg/dL (ref 8.9–10.3)
Chloride: 99 mmol/L (ref 98–111)
Creatinine, Ser: 1.12 mg/dL (ref 0.61–1.24)
GFR, Estimated: >60 mL/min (ref >60)
Glucose, Bld: 117 mg/dL — ABNORMAL HIGH (ref 70–99)
Potassium: 3.4 mmol/L — ABNORMAL LOW (ref 3.5–5.1)
Sodium: 135 mmol/L (ref 135–145)
Total Bilirubin: 1.5 mg/dL — ABNORMAL HIGH (ref 0.3–1.2)
Total Protein: 6.8 g/dL (ref 6.5–8.1)

## 2020-10-28 LAB — CBC
HCT: 45.1 % (ref 39.0–52.0)
Hemoglobin: 16 g/dL (ref 13.0–17.0)
MCH: 32.8 pg (ref 26.0–34.0)
MCHC: 35.5 g/dL (ref 30.0–36.0)
MCV: 92.4 fL (ref 80.0–100.0)
Platelets: 234 10*3/uL (ref 150–400)
RBC: 4.88 MIL/uL (ref 4.22–5.81)
RDW: 12.9 % (ref 11.5–15.5)
WBC: 7.6 10*3/uL (ref 4.0–10.5)
nRBC: 0 % (ref 0.0–0.2)

## 2020-10-28 LAB — APTT: aPTT: 31 seconds (ref 24–36)

## 2020-10-28 LAB — PROTIME-INR
INR: 1.1 (ref 0.8–1.2)
Prothrombin Time: 14 seconds (ref 11.4–15.2)

## 2020-10-28 LAB — SARS CORONAVIRUS 2 (TAT 6-24 HRS): SARS Coronavirus 2: NEGATIVE

## 2020-10-28 MED ORDER — ALBUTEROL SULFATE HFA 108 (90 BASE) MCG/ACT IN AERS: 2.0000 | INHALATION_SPRAY | Freq: Four times a day (QID) | RESPIRATORY_TRACT | 2 refills | Status: DC | PRN

## 2020-10-28 NOTE — Progress Notes (Signed)
Phone [phone] In person visit   Subjective:   Joe Hill is a 80 y.o. year old very pleasant male patient who presents for/with See problem oriented charting Chief Complaint  Patient presents with   Hypertension    This visit occurred during the SARS-CoV-2 public health emergency.  Safety protocols were in place, including screening questions prior to the visit, additional usage of staff PPE, and extensive cleaning of exam room while observing appropriate contact time as indicated for disinfecting solutions.   Past Medical History-  Patient Active Problem List   Diagnosis Date Noted   EMPHYSEMA 10/16/2009    Priority: 1.   TOBACCO USE 01/30/2007    Priority: 1.   PROSTATE CANCER, 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    Priority: 1.   Dyslipidemia 10/23/2016    Priority: 2.   Hyperglycemia 08/12/2009    Priority: 2.   Essential hypertension 08/04/2007    Priority: 2.   Abnormal CT scan, chest 09/07/2010    Priority: 3.   GERD 09/09/2009    Priority: 3.   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   Aortic atherosclerosis ([place]) 10/28/2020    Priority: 2.    Medications- reviewed and updated Current Outpatient Medications  Medication Sig Dispense Refill   albuterol (VENTOLIN HFA) 108 (90 Base) MCG/ACT inhaler Inhale 2 puffs into the lungs every 6 (six) hours as needed for wheezing or shortness of breath. 1 each 2   amLODipine (NORVASC) 5 MG tablet TAKE 1 TABLET BY MOUTH EVERY DAY 90 tablet 3   dextromethorphan-guaiFENesin (MUCINEX DM) 30-600 MG 12hr tablet Take 1 tablet by mouth 2 (two) times daily as needed for cough.     No current facility-administered medications for this visit.     Objective:  BP 119/77   Pulse 67   Temp 99.2 F (37.3 C) (Temporal)   Ht 5\' 10"  (1.778 m)   Wt 187 lb 9.6 oz (85.1 kg)   SpO2 98%   BMI 26.92 kg/m  Gen: NAD, resting comfortably smells of smoke CV: RRR no murmurs rubs or gallops Lungs: CTAB no crackles, wheeze, rhonchi Ext: no  edema Skin: warm, dry     Assessment and Plan  # Scheduled Thoracic Surgery for a video bronchoscopy with endobronchial navigation with Dr. Roxan Hockey on 10/30/2020-need for this determined by PET/CT with hypermetabolic 3.5 cm upper lobe solid lung mass concerning for primary bronchogenic carcinoma.  Probable metastasis 1.2 cm solid pulmonary nodule in right upper lobe and widespread mild hypermetabolic metastatic adenopathy in the mediastinum as well as hypermetabolic left adrenal metastasis. -Work-up originally started when patient woke up with raspy voice in June 2022 and was later referred to ENT Dr. Redmond Baseman vocal cord paralysis led to CT of the neck and chest which ultimately showed left apical mass with mediastinal adenopathy.  It was down to 1 to 2 cigarettes a day in early September when he saw Dr. Roxan Hockey.  Thankfully MRI of the brain was reassuring -some fear about radiation- dad had this and severely affected ability to eat and ultimately led to starvation  -declines flu shot. Considering  new bivalent booster once things get settled with likely lung cancer - can climb 2 flights of stairs without CP or SOB- would not need further cardiac clearance plus seeing Dr. Roxan Hockey  # Emphysema/tobacco abuse S:Under a pack a day previously now down to 1-2 a day and working on quitting. Mucinex helps with some chest congestion.    Denies limitations in breathing other  than stopping to rest when mowing yard. Emphysema noted on CT in past  and recent CTand PFTs in 2011 with Dr. Gwenette Greet.  A/P: Patient has noted some mild progressive symptoms in regards to chest congestion and shortness of breath-he is now willing to have albuterol on hand-this was sent in for him.  Congratulated him on his tremendous efforts for quitting smoking down to 1 to 2 cigarettes/day and encouraged him to completely stop  #hypertension S: medication:  amlodipine 5 mg daily BP Readings from Last 3 Encounters:   10/28/20 119/77  09/25/20 135/83  08/08/20 (!) 113/55  A/P: Controlled. Continue current medications.   #dyslipidemia- low HDL, LDL <70 #Aortic atherosclerosis-noted on CT and coronary calcifications S: Medication: none- did not tolerate rosuvastatin in past due to side effects- upset stomach. Last year controlled without meds though Lab Results  Component Value Date   CHOL 116 10/15/2019   HDL 38 (L) 10/15/2019   LDLCALC 65 10/15/2019   TRIG 53 10/15/2019   CHOLHDL 3.1 10/15/2019  A/P: hopefully LDL remains under 70- update lipids- could try alternate weaker statin perhaps-we are going to try to get this added to preop labs  # Hyperglycemia/insulin resistance/prediabetes- a1c has not been elevated, just fasting CBGs. Will hold off on check this year Lab Results  Component Value Date   HGBA1C 5.4 10/15/2019   HGBA1C 5.6 10/10/2018   HGBA1C 5.4 11/17/2017    Recommended follow up: No follow-ups on file. Future Appointments  Date Time Provider [place]  10/28/2020 11:00 AM [facility] PAT 3 [facility] None   Lab/Order associations:   ICD-10-CM   1. Essential hypertension  I10     2. Dyslipidemia  E78.5 Lipid panel    3. EMPHYSEMA  J43.8     4. Aortic atherosclerosis ([place])  I70.0      I,Harris Phan,acting as a scribe for Garret Reddish, MD.,have documented all relevant documentation on the behalf of Garret Reddish, MD,as directed by  Garret Reddish, MD while in the presence of Garret Reddish, MD.  I, Garret Reddish, MD, have reviewed all documentation for this visit. The documentation on 10/28/20 for the exam, diagnosis, procedures, and orders are all accurate and complete.   Return precautions advised.  Garret Reddish, MD

## 2020-10-28 NOTE — Patient Instructions (Addendum)
Health Maintenance Due  Topic Date Due   Zoster Vaccines- Shingrix (1 of 2) - Patient will hold off until things are settled. Never done   COLONOSCOPY (Pts 45-9yrs Insurance coverage will need to be confirmed)   - Patient will hold off until things are settled. 10/09/2014   TETANUS/TDAP   - Patient will hold off until things are settled. 05/01/2017   Please stop by lab before you go If you have mychart- we will send your results within 3 business days of [place] receiving them.  If you do not have mychart- we will call you about results within 5 business days of [place] receiving them.  *please also note that you will see labs on mychart as soon as they post. I will later go in and write notes on them- will say "notes from Dr. Yong Channel"  I will send in an order for a rescue inhaler- please keep this on hand and use 2 puffs for every 6 hours for wheezing or shortness of breath.  I want to congratulate you on being down to 1-2 cigarettes per day! I still recommend complete cessation and I know that you are trying.  Hoping for the best with your upcoming procedure and then subsequent treatments  Recommended getting Omicron specific booster only at your local pharmacy once things get settled! Please let us know when you have received this vaccination.  Recommended follow up: Return in about 6 months (around 04/28/2021) for follow up- or sooner if needed.

## 2020-10-28 NOTE — Progress Notes (Signed)
PCP - Dr. Garret Reddish Cardiologist - denies  PPM/ICD - n/a Device Orders - n/a Rep Notified - n/a  Chest x-ray - 10/28/20 EKG - 07/22/20 Stress Test - denies ECHO - denies Cardiac Cath - denies  Sleep Study - denies CPAP - n/a  Fasting Blood Sugar - n/a Checks Blood Sugar __  n/a __ times a day  Blood Thinner Instructions: n/a Aspirin Instructions: n/a  ERAS Protcol - No PRE-SURGERY Ensure or G2- n/a  COVID TEST- 10/28/20. Pending.    Anesthesia review: Yes.   Patient denies shortness of breath, fever, cough and chest pain at PAT appointment   All instructions explained to the patient, with a verbal understanding of the material. Patient agrees to go over the instructions while at home for a better understanding. Patient also instructed to self quarantine after being tested for COVID-19. The opportunity to ask questions was provided.

## 2020-10-30 ENCOUNTER — Ambulatory Visit (HOSPITAL_COMMUNITY): Payer: Medicare Other | Admitting: Anesthesiology

## 2020-10-30 ENCOUNTER — Other Ambulatory Visit: Payer: Self-pay

## 2020-10-30 ENCOUNTER — Encounter (HOSPITAL_COMMUNITY)
Admission: RE | Disposition: A | Discharge status: Home / Self Care | Payer: Self-pay | Source: Home / Self Care | Attending: Thoracic Surgery (Cardiothoracic Vascular Surgery)

## 2020-10-30 ENCOUNTER — Ambulatory Visit (HOSPITAL_COMMUNITY)
Admission: RE | Admit: 2020-10-30 | Discharge: 2020-10-30 | Disposition: A | Discharge status: Home / Self Care | Payer: Medicare Other | Attending: Thoracic Surgery (Cardiothoracic Vascular Surgery) | Admitting: Thoracic Surgery (Cardiothoracic Vascular Surgery)

## 2020-10-30 ENCOUNTER — Ambulatory Visit (HOSPITAL_COMMUNITY): Payer: Medicare Other

## 2020-10-30 ENCOUNTER — Ambulatory Visit (HOSPITAL_COMMUNITY): Payer: Medicare Other | Admitting: Emergency Medicine

## 2020-10-30 DIAGNOSIS — I10 Essential (primary) hypertension: Secondary | ICD-10-CM | POA: Insufficient documentation

## 2020-10-30 DIAGNOSIS — F17210 Nicotine dependence, cigarettes, uncomplicated: Secondary | ICD-10-CM | POA: Insufficient documentation

## 2020-10-30 DIAGNOSIS — Z801 Family history of malignant neoplasm of trachea, bronchus and lung: Secondary | ICD-10-CM | POA: Diagnosis not present

## 2020-10-30 DIAGNOSIS — Z79899 Other long term (current) drug therapy: Secondary | ICD-10-CM | POA: Diagnosis not present

## 2020-10-30 DIAGNOSIS — J439 Emphysema, unspecified: Secondary | ICD-10-CM | POA: Insufficient documentation

## 2020-10-30 DIAGNOSIS — I4891 Unspecified atrial fibrillation: Secondary | ICD-10-CM | POA: Insufficient documentation

## 2020-10-30 DIAGNOSIS — C3412 Malignant neoplasm of upper lobe, left bronchus or lung: Secondary | ICD-10-CM | POA: Insufficient documentation

## 2020-10-30 DIAGNOSIS — Z8546 Personal history of malignant neoplasm of prostate: Secondary | ICD-10-CM | POA: Diagnosis not present

## 2020-10-30 DIAGNOSIS — R918 Other nonspecific abnormal finding of lung field: Secondary | ICD-10-CM

## 2020-10-30 DIAGNOSIS — C771 Secondary and unspecified malignant neoplasm of intrathoracic lymph nodes: Secondary | ICD-10-CM | POA: Diagnosis not present

## 2020-10-30 DIAGNOSIS — J3801 Paralysis of vocal cords and larynx, unilateral: Secondary | ICD-10-CM | POA: Insufficient documentation

## 2020-10-30 DIAGNOSIS — E278 Other specified disorders of adrenal gland: Secondary | ICD-10-CM | POA: Diagnosis not present

## 2020-10-30 DIAGNOSIS — Z419 Encounter for procedure for purposes other than remedying health state, unspecified: Secondary | ICD-10-CM

## 2020-10-30 DIAGNOSIS — I4819 Other persistent atrial fibrillation: Secondary | ICD-10-CM

## 2020-10-30 DIAGNOSIS — C801 Malignant (primary) neoplasm, unspecified: Secondary | ICD-10-CM | POA: Diagnosis not present

## 2020-10-30 DIAGNOSIS — J449 Chronic obstructive pulmonary disease, unspecified: Secondary | ICD-10-CM | POA: Diagnosis not present

## 2020-10-30 DIAGNOSIS — K219 Gastro-esophageal reflux disease without esophagitis: Secondary | ICD-10-CM | POA: Diagnosis not present

## 2020-10-30 DIAGNOSIS — Z880 Allergy status to penicillin: Secondary | ICD-10-CM | POA: Insufficient documentation

## 2020-10-30 HISTORY — PX: VIDEO BRONCHOSCOPY WITH ENDOBRONCHIAL ULTRASOUND: SHX6177

## 2020-10-30 HISTORY — PX: VIDEO BRONCHOSCOPY WITH ENDOBRONCHIAL NAVIGATION: SHX6175

## 2020-10-30 SURGERY — VIDEO BRONCHOSCOPY WITH ENDOBRONCHIAL NAVIGATION
Anesthesia: General

## 2020-10-30 MED ORDER — DILTIAZEM HCL ER COATED BEADS 180 MG PO CP24: 180.0000 mg | ORAL_CAPSULE | Freq: Every day | ORAL | 11 refills | Status: DC

## 2020-10-30 MED ORDER — ROCURONIUM BROMIDE 10 MG/ML (PF) SYRINGE
PREFILLED_SYRINGE | INTRAVENOUS | Status: AC
  Filled 2020-10-30: qty 10

## 2020-10-30 MED ORDER — ROCURONIUM BROMIDE 10 MG/ML (PF) SYRINGE
PREFILLED_SYRINGE | INTRAVENOUS | Status: DC | PRN
  Administered 2020-10-30: 40 mg via INTRAVENOUS
  Administered 2020-10-30: 10 mg via INTRAVENOUS

## 2020-10-30 MED ORDER — PROPOFOL 10 MG/ML IV BOLUS
INTRAVENOUS | Status: AC
  Filled 2020-10-30: qty 20

## 2020-10-30 MED ORDER — DEXAMETHASONE SODIUM PHOSPHATE 10 MG/ML IJ SOLN
INTRAMUSCULAR | Status: AC
  Filled 2020-10-30: qty 1

## 2020-10-30 MED ORDER — ONDANSETRON HCL 4 MG/2ML IJ SOLN
INTRAMUSCULAR | Status: DC | PRN
  Administered 2020-10-30: 4 mg via INTRAVENOUS

## 2020-10-30 MED ORDER — AMIODARONE IV BOLUS ONLY 150 MG/100ML
INTRAVENOUS | Status: DC | PRN
  Administered 2020-10-30: 150 mg via INTRAVENOUS

## 2020-10-30 MED ORDER — PROMETHAZINE HCL 25 MG/ML IJ SOLN: 6.2500 mg | INTRAMUSCULAR | Status: DC | PRN

## 2020-10-30 MED ORDER — CHLORHEXIDINE GLUCONATE 0.12 % MT SOLN
15.0000 mL | Freq: Once | OROMUCOSAL | Status: AC
  Administered 2020-10-30: 15 mL via OROMUCOSAL
  Filled 2020-10-30: qty 15

## 2020-10-30 MED ORDER — APIXABAN 5 MG PO TABS: 5.0000 mg | ORAL_TABLET | Freq: Two times a day (BID) | ORAL | 11 refills | Status: DC

## 2020-10-30 MED ORDER — FENTANYL CITRATE (PF) 250 MCG/5ML IJ SOLN
INTRAMUSCULAR | Status: AC
  Filled 2020-10-30: qty 5

## 2020-10-30 MED ORDER — ORAL CARE MOUTH RINSE: 15.0000 mL | Freq: Once | OROMUCOSAL | Status: AC

## 2020-10-30 MED ORDER — SUGAMMADEX SODIUM 200 MG/2ML IV SOLN
INTRAVENOUS | Status: DC | PRN
  Administered 2020-10-30 (×2): 100 mg via INTRAVENOUS

## 2020-10-30 MED ORDER — EPINEPHRINE PF 1 MG/ML IJ SOLN
INTRAMUSCULAR | Status: DC | PRN
  Administered 2020-10-30: 1 mg

## 2020-10-30 MED ORDER — EPINEPHRINE PF 1 MG/ML IJ SOLN
INTRAMUSCULAR | Status: AC
  Filled 2020-10-30: qty 1

## 2020-10-30 MED ORDER — 0.9 % SODIUM CHLORIDE (POUR BTL) OPTIME
TOPICAL | Status: DC | PRN
  Administered 2020-10-30: 1000 mL

## 2020-10-30 MED ORDER — LIDOCAINE 2% (20 MG/ML) 5 ML SYRINGE
INTRAMUSCULAR | Status: DC | PRN
  Administered 2020-10-30: 60 mg via INTRAVENOUS

## 2020-10-30 MED ORDER — MEPERIDINE HCL 25 MG/ML IJ SOLN: 6.2500 mg | INTRAMUSCULAR | Status: DC | PRN

## 2020-10-30 MED ORDER — FENTANYL CITRATE (PF) 100 MCG/2ML IJ SOLN: 25.0000 ug | INTRAMUSCULAR | Status: DC | PRN

## 2020-10-30 MED ORDER — PHENYLEPHRINE 40 MCG/ML (10ML) SYRINGE FOR IV PUSH (FOR BLOOD PRESSURE SUPPORT)
PREFILLED_SYRINGE | INTRAVENOUS | Status: AC
  Filled 2020-10-30: qty 20

## 2020-10-30 MED ORDER — ONDANSETRON HCL 4 MG/2ML IJ SOLN
INTRAMUSCULAR | Status: AC
  Filled 2020-10-30: qty 2

## 2020-10-30 MED ORDER — LIDOCAINE 2% (20 MG/ML) 5 ML SYRINGE
INTRAMUSCULAR | Status: AC
  Filled 2020-10-30: qty 5

## 2020-10-30 MED ORDER — AMIODARONE HCL IN DEXTROSE 360-4.14 MG/200ML-% IV SOLN
INTRAVENOUS | Status: DC | PRN
  Administered 2020-10-30: 30 mg/h via INTRAVENOUS

## 2020-10-30 MED ORDER — PHENYLEPHRINE 40 MCG/ML (10ML) SYRINGE FOR IV PUSH (FOR BLOOD PRESSURE SUPPORT)
PREFILLED_SYRINGE | INTRAVENOUS | Status: DC | PRN
  Administered 2020-10-30 (×4): 80 ug via INTRAVENOUS

## 2020-10-30 MED ORDER — DEXAMETHASONE SODIUM PHOSPHATE 10 MG/ML IJ SOLN
INTRAMUSCULAR | Status: DC | PRN
  Administered 2020-10-30: 5 mg via INTRAVENOUS

## 2020-10-30 MED ORDER — FENTANYL CITRATE (PF) 250 MCG/5ML IJ SOLN
INTRAMUSCULAR | Status: DC | PRN
  Administered 2020-10-30: 100 ug via INTRAVENOUS

## 2020-10-30 MED ORDER — LACTATED RINGERS IV SOLN: INTRAVENOUS | Status: DC

## 2020-10-30 MED ORDER — PROPOFOL 10 MG/ML IV BOLUS
INTRAVENOUS | Status: DC | PRN
  Administered 2020-10-30: 120 mg via INTRAVENOUS

## 2020-10-30 SURGICAL SUPPLY — 55 items
ADAPTER BRONCHOSCOPE OLYMPUS (ADAPTER) ×2 IMPLANT
ADAPTER VALVE BIOPSY EBUS (MISCELLANEOUS) IMPLANT
ADPR BSCP OLMPS EDG (ADAPTER) ×1
ADPTR VALVE BIOPSY EBUS (MISCELLANEOUS)
BLADE CLIPPER SURG (BLADE) ×2 IMPLANT
BRUSH BIOPSY BRONCH 10 SDTNB (MISCELLANEOUS) IMPLANT
BRUSH CYTOL CELLEBRITY 1.5X140 (MISCELLANEOUS) IMPLANT
BRUSH SUPERTRAX BIOPSY (INSTRUMENTS) IMPLANT
BRUSH SUPERTRAX NDL-TIP CYTO (INSTRUMENTS) ×3 IMPLANT
CANISTER SUCT 3000ML PPV (MISCELLANEOUS) ×4 IMPLANT
CNTNR URN SCR LID CUP LEK RST (MISCELLANEOUS) ×3 IMPLANT
CONT SPEC 4OZ STRL OR WHT (MISCELLANEOUS) ×6
COVER BACK TABLE 60X90IN (DRAPES) ×3 IMPLANT
FILTER STRAW FLUID ASPIR (MISCELLANEOUS) IMPLANT
FORCEPS BIOP RJ4 1.8 (CUTTING FORCEPS) IMPLANT
FORCEPS BIOP SUPERTRX PREMAR (INSTRUMENTS) IMPLANT
FORCEPS RADIAL JAW LRG 4 PULM (INSTRUMENTS) IMPLANT
GAUZE SPONGE 4X4 12PLY STRL (GAUZE/BANDAGES/DRESSINGS) ×2 IMPLANT
GLOVE SURG SIGNA 7.5 PF LTX (GLOVE) ×4 IMPLANT
GOWN STRL REUS W/ TWL XL LVL3 (GOWN DISPOSABLE) ×2 IMPLANT
GOWN STRL REUS W/TWL XL LVL3 (GOWN DISPOSABLE) ×4
KIT CLEAN ENDO COMPLIANCE (KITS) ×5 IMPLANT
KIT ILLUMISITE 180 PROCEDURE (KITS) ×1 IMPLANT
KIT ILLUMISITE 90 PROCEDURE (KITS) IMPLANT
KIT TURNOVER KIT B (KITS) ×3 IMPLANT
MARKER SKIN DUAL TIP RULER LAB (MISCELLANEOUS) ×3 IMPLANT
NDL ASPIRATION VIZISHOT 19G (NEEDLE) IMPLANT
NDL ASPIRATION VIZISHOT 21G (NEEDLE) ×1 IMPLANT
NDL BLUNT 18X1 FOR OR ONLY (NEEDLE) IMPLANT
NDL SUPERTRX PREMARK BIOPSY (NEEDLE) IMPLANT
NEEDLE ASPIRATION VIZISHOT 19G (NEEDLE) IMPLANT
NEEDLE ASPIRATION VIZISHOT 21G (NEEDLE) ×2 IMPLANT
NEEDLE BLUNT 18X1 FOR OR ONLY (NEEDLE) IMPLANT
NEEDLE SUPERTRX PREMARK BIOPSY (NEEDLE) IMPLANT
NS IRRIG 1000ML POUR BTL (IV SOLUTION) ×4 IMPLANT
OIL SILICONE PENTAX (PARTS (SERVICE/REPAIRS)) ×3 IMPLANT
PAD ARMBOARD 7.5X6 YLW CONV (MISCELLANEOUS) ×7 IMPLANT
PATCHES PATIENT (LABEL) ×6 IMPLANT
RADIAL JAW LRG 4 PULMONARY (INSTRUMENTS)
SYR 20ML ECCENTRIC (SYRINGE) ×4 IMPLANT
SYR 20ML LL LF (SYRINGE) ×4 IMPLANT
SYR 30ML LL (SYRINGE) ×2 IMPLANT
SYR 3ML LL SCALE MARK (SYRINGE) IMPLANT
SYR 5ML LL (SYRINGE) ×3 IMPLANT
SYR 5ML LUER SLIP (SYRINGE) ×2 IMPLANT
SYR TB 1ML LUER SLIP (SYRINGE) IMPLANT
TOWEL GREEN STERILE (TOWEL DISPOSABLE) ×3 IMPLANT
TOWEL GREEN STERILE FF (TOWEL DISPOSABLE) ×3 IMPLANT
TRAP SPECIMEN MUCUS 40CC (MISCELLANEOUS) ×3 IMPLANT
TUBE CONNECTING 20X1/4 (TUBING) ×6 IMPLANT
UNDERPAD 30X36 HEAVY ABSORB (UNDERPADS AND DIAPERS) ×2 IMPLANT
VALVE BIOPSY  SINGLE USE (MISCELLANEOUS) ×4
VALVE BIOPSY SINGLE USE (MISCELLANEOUS) ×2 IMPLANT
VALVE SUCTION BRONCHIO DISP (MISCELLANEOUS) ×4 IMPLANT
WATER STERILE IRR 1000ML POUR (IV SOLUTION) ×4 IMPLANT

## 2020-10-30 NOTE — Consult Note (Addendum)
Cardiology Consultation:   Patient ID: [name] MRN: [id number]; DOB: [birth year]  Admit date: 10/30/2020 Date of Consult: 10/30/2020  PCP:  [name], MD   [place] Providers Cardiologist:  None     New  Patient Profile:   Joe Hill is a 80 y.o. male with a hx of HTN, HLD, coronary calcifications/Ao atherosclerosis on CT, pre-DM, emphysema, probable lung CA, hx prostate CA, probable adrenal adenomas, who is being seen 10/30/2020 for the evaluation of atrial fib, at the request of Dr Roxan Hockey.  History of Present Illness:   Joe Hill was seen for hoarse voice >> neck CT w/ apical mass >> PET scan w/ hypermetabolic masses both apices, widespread mediastinal metastatic adenopathy, L adrenal met. He has cut down on smoking.   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 Once PET scan reviewed, best plan was felt to be navigational bronchoscopy and endobronchial ultrasound for diagnosis and staging. He was scheduled for this today.   Joe Hill, according to the nursing staff, he was not in atrial fibrillation and started the procedure but went in during the procedure.  During the procedure, he was started on IV amiodarone.  Cardiology was asked to see him in PACU.  He tolerated the surgery well.  Currently, in PACU, the amiodarone is running at 30 mg an hour and his heart rate is controlled but still in atrial fibrillation.  Joe Hill has never had any palpitations.  He has never had any presyncope or syncope.  He does not check his blood pressure very often, but has never been told that his heart rate was high and has never been aware of his heart rate in any way.  Up until a few weeks ago, when the process of him being diagnosed with lung cancer with was begun, he was mowing his own grass and active around the house and yard without any symptoms at all.  Specifically, he denies dyspnea on exertion, orthopnea, PND, lower extremity edema.  He  has never had chest pain.    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   TOBACCO USE 01/30/2007    Past Surgical History:  Procedure Laterality Date   APPENDECTOMY     CATARACT EXTRACTION     PROSTATE SURGERY     prostatectomy     Home Medications:  Prior to Admission medications   Medication Sig Start Date End Date Taking? Authorizing Provider  amLODipine (NORVASC) 5 MG tablet TAKE 1 TABLET BY MOUTH EVERY DAY 09/03/20  Yes [name], MD  dextromethorphan-guaiFENesin [facility] DM) 30-600 MG 12hr tablet Take 1 tablet by mouth 2 (two) times daily as needed for cough.   Yes [provider]  albuterol (VENTOLIN HFA) 108 (90 Base) MCG/ACT inhaler Inhale 2 puffs into the lungs every 6 (six) hours as needed for wheezing or shortness of breath. 10/28/20   [name], MD    Inpatient Medications: Scheduled Meds:  Continuous Infusions:  lactated ringers 10 mL/hr at 10/30/20 1247   PRN Meds: fentaNYL (SUBLIMAZE) injection, meperidine (DEMEROL) injection, promethazine  Allergies:    Allergies  Allergen Reactions   Penicillins Hives    Social History:   Social History  Socioeconomic History   Marital status: Single    Spouse name: Not on file   Number of children: Not on file   Years of education: Not on file   Highest education level: Not on file  Occupational History   Not on file  Tobacco Use   Smoking status: Every Day    Packs/day: 0.25    Types: Cigarettes   Smokeless tobacco: Never  Vaping Use   Vaping Use: Never used  Substance and Sexual Activity   Alcohol use: No    Alcohol/week: 0.0 standard drinks   Drug use: No   Sexual activity: Not on file  Other Topics Concern   Not on  file  Social History Narrative   Divorced. 2 sons. 5 grandkids- 2 in college, 3 in hs in 2021.       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       HObbies: woodworking, taking care of yard, very active at work   Social Determinants of Radio broadcast assistant Strain: Not on file  Food Insecurity: Not on file  Transportation Needs: Not on file  Physical Activity: Not on file  Stress: Not on file  Social Connections: Not on file  Intimate Partner Violence: Not on file    Family History:   Family History  Problem Relation Age of Onset   Hyperlipidemia Mother    Cancer Father        lung, smoker   Cancer Sister        breast, smoker   Arthritis Maternal Aunt      ROS:  Please see the history of present illness.  All other ROS reviewed and negative.     Physical Exam/Data:   Vitals:   10/30/20 1440 10/30/20 1455 10/30/20 1525 10/30/20 1555  BP: 106/67 106/64 105/60 120/75  Pulse: 89 81 82 88  Resp: (!) '26 19 18 19  ' Temp:  (!) 97 F (36.1 C)    TempSrc:      SpO2: 94% 92% 97% 98%  Weight:      Height:        Intake/Output Summary (Last 24 hours) at 10/30/2020 1630 Last data filed at 10/30/2020 1410 Gross per 24 hour  Intake 1000 ml  Output 0 ml  Net 1000 ml   Last 3 Weights 10/30/2020 10/28/2020 10/28/2020  Weight (lbs) 190 lb 188 lb 187 lb 9.6 oz  Weight (kg) 86.183 kg 85.276 kg 85.095 kg     Body mass index is 27.26 kg/m.  General:  Well nourished, well developed, in no acute distress HEENT: normal Neck: no JVD Vascular: No carotid bruits; Distal pulses 2+ bilaterally Cardiac:  normal S1, S2; Irreg R&R; no murmur  Lungs:  clear to auscultation bilaterally, no wheezing, rhonchi or rales  Abd: soft, nontender, no hepatomegaly  Ext: no edema Musculoskeletal:  No deformities, BUE and BLE strength normal and equal Skin: warm and dry  Neuro:  CNs 2-12 intact, no focal abnormalities  noted Psych:  Normal affect   EKG:  The EKG was personally reviewed and demonstrates:  atrial fib, HR 92, no acute changes Telemetry:  Telemetry was personally reviewed and demonstrates:  atrial fib, rate generally controlled.  Relevant CV Studies: None previously  Laboratory Data:  High Sensitivity Troponin:  No results for input(s): TROPONINIHS in the last 720 hours.   Chemistry Recent Labs  Lab 10/28/20 1135  NA 135  K 3.4*  CL 99  CO2 29  GLUCOSE 117*  BUN 13  CREATININE 1.12  CALCIUM 9.1  GFRNONAA >60  ANIONGAP 7    Recent Labs  Lab 10/28/20 1135  PROT 6.8  ALBUMIN 4.1  AST 16  ALT 13  ALKPHOS 76  BILITOT 1.5*   Lipids No results for input(s): CHOL, TRIG, HDL, LABVLDL, LDLCALC, CHOLHDL in the last 168 hours.  Hematology Recent Labs  Lab 10/28/20 1135  WBC 7.6  RBC 4.88  HGB 16.0  HCT 45.1  MCV 92.4  MCH 32.8  MCHC 35.5  RDW 12.9  PLT 234   Thyroid No results for input(s): TSH, FREET4 in the last 168 hours.  BNPNo results for input(s): BNP, PROBNP in the last 168 hours.  DDimer No results for input(s): DDIMER in the last 168 hours. Lab Results  Component Value Date   CHOL 116 10/15/2019   HDL 38 (L) 10/15/2019   LDLCALC 65 10/15/2019   TRIG 53 10/15/2019   CHOLHDL 3.1 10/15/2019     Radiology/Studies:  DG Chest 2 View  Result Date: 10/30/2020 CLINICAL DATA:  Pre-op for video bronchoscopy with endobronchial navigation on 10/06. Patient denies all chest complaints at this time. EXAM: CHEST - 2 VIEW COMPARISON:  NM PET 10/15/2020.  CT Chest 09/03/2020. FINDINGS: Normal heart size. Grossly unchanged 3.5 cm paramediastinal anterior left upper lobe mass, best appreciated on view. Coarse interstitial markings with emphysematous changes. No focal consolidation, pleural effusion, or pneumothorax. Apparent nodular density at the right lung base is without correlate on the lateral view and is favored to reflect overlapping rib and interstitial markings  rather than a discrete nodule given absence of any finding in this area on recent PET-CT. No acute osseous abnormality. Mild elevation of the left hemidiaphragm. IMPRESSION: 1. Grossly unchanged paramediastinal anterior left upper lobe mass. 2. No acute cardiopulmonary disease. 3. COPD. Electronically Signed   By: Titus Dubin M.D.   On: 10/30/2020 08:39   DG C-ARM BRONCHOSCOPY  Result Date: 10/30/2020 C-ARM BRONCHOSCOPY: Fluoroscopy was utilized by the requesting physician.  No radiographic interpretation.     Assessment and Plan:   Atrial fibrillation, RVR -New diagnosis, but may have been having episodes for a while as he is completely asymptomatic - CHA2DS2-VASc is 5, but cannot start anticoagulation right now because of surgery today - Timing of initiation of Eliquis to be determined by Dr. Roxan Hockey based on bleeding risk - He was started on amiodarone during the procedure, d/c this - Can also change amlodipine 5 mg daily to Cardizem CD 180 mg daily and follow blood pressure/heart rate. -Follow-up in the A. fib clinic. - Dr. Lovena Le to advise if we need to get an echo.  If we do, suspect this can be done as an outpatient.   Risk Assessment/Risk Scores:     CHA2DS2-VASc Score = 5   This indicates a 7.2% annual risk of stroke. The patient's score is based upon: CHF History: 0 HTN History: 1 Diabetes History: 1 Stroke History: 0 Vascular Disease History: 1 Age Score: 2 Gender Score: 0   For questions or updates, please contact [facility] Please consult [email or web address] for contact info under   Signed, Rosaria Ferries, PA-C  10/30/2020 4:30 PM   EP Attending  Patient seen and examined. Agree with the findings as noted above. The patient is a very pleasant 80 yo man with likely metastatic lung CA who underwent bronchoscopy earlier this afternoon and was noted to go into atrial fib with a CVR/RVR.  The patient is asymptomatic. He was placed intraoperatively on IV  amiodarone. He feels well and is asymptomatic. He does not have a diagnosis of atrial fib. He has not had syncope and denies chest pain. He is hoarse. No sob. His exam demonstrates a pleasant well appearing man, NAD. CV with an IRIR rhythm. Ext with no edema. Lungs reveal decreased breath sounds. ECG reveals atrial fib with a CVR and nonspecific STT changes.  A/P New atrial fib - I have discussed the mechanism and treatment options with the patient. As he is asymptomatic, a strategy of rate control is recommended. I have asked him to stop amlodipine 5 mg daily and start cardizem 180 mg daily. I discussed anti-coag with Dr. Roxan Hockey and he recommends that we hold off on Eliquis for 2 days. We will start 11/01/20 p.m. we will followup early next week.  CA - he is at risk for bleeding and clotting. He will need careful followup. He will see Oda Kilts, PA-C next week and me in 2-3 months. Carleene Overlie Mazell Aylesworth,MD

## 2020-10-30 NOTE — Anesthesia Preprocedure Evaluation (Addendum)
Anesthesia Evaluation  Patient identified by MRN, date of birth, ID band  Reviewed: Allergy & Precautions, NPO status , Patient's Chart, lab work & pertinent test results  Airway Mallampati: II  TM Distance: >3 FB Neck ROM: Full    Dental  (+) Dental Advisory Given, Teeth Intact, Chipped   Pulmonary COPD, Current Smoker and Patient abstained from smoking.,    Pulmonary exam normal breath sounds clear to auscultation       Cardiovascular hypertension, Pt. on medications Normal cardiovascular exam Rhythm:Regular Rate:Normal     Neuro/Psych negative neurological ROS     GI/Hepatic Neg liver ROS, GERD  ,  Endo/Other  negative endocrine ROS  Renal/GU negative Renal ROS     Musculoskeletal negative musculoskeletal ROS (+)   Abdominal   Peds  Hematology negative hematology ROS (+)   Anesthesia Other Findings   Reproductive/Obstetrics                            Anesthesia Physical Anesthesia Plan  ASA: 3  Anesthesia Plan: General   Post-op Pain Management:    Induction: Intravenous  PONV Risk Score and Plan: 1 and Ondansetron, Dexamethasone, Treatment may vary due to age or medical condition and Diphenhydramine  Airway Management Planned: Oral ETT  Additional Equipment: None  Intra-op Plan:   Post-operative Plan: Extubation in OR  Informed Consent: I have reviewed the patients History and Physical, chart, labs and discussed the procedure including the risks, benefits and alternatives for the proposed anesthesia with the patient or authorized representative who has indicated his/her understanding and acceptance.     Dental advisory given  Plan Discussed with: CRNA  Anesthesia Plan Comments:         Anesthesia Quick Evaluation

## 2020-10-30 NOTE — Anesthesia Procedure Notes (Signed)
Procedure Name: Intubation Date/Time: 10/30/2020 1:00 PM Performed by: Trinna Post., CRNA Pre-anesthesia Checklist: Patient identified, Emergency Drugs available, Suction available, Patient being monitored and Timeout performed Patient Re-evaluated:Patient Re-evaluated prior to induction Oxygen Delivery Method: Circle system utilized Preoxygenation: Pre-oxygenation with 100% oxygen Induction Type: IV induction Ventilation: Mask ventilation without difficulty Laryngoscope Size: Mac and 4 Grade View: Grade I Tube type: Oral Tube size: 7.5 mm Number of attempts: 1 Airway Equipment and Method: Stylet Placement Confirmation: ETT inserted through vocal cords under direct vision, positive ETCO2 and breath sounds checked- equal and bilateral Secured at: 22 cm Tube secured with: Tape Dental Injury: Teeth and Oropharynx as per pre-operative assessment

## 2020-10-30 NOTE — Transfer of Care (Signed)
Immediate Anesthesia Transfer of Care Note  Patient: Joe Hill  Procedure(s) Performed: VIDEO BRONCHOSCOPY WITH ENDOBRONCHIAL NAVIGATION VIDEO BRONCHOSCOPY WITH ENDOBRONCHIAL ULTRASOUND  Patient Location: PACU  Anesthesia Type:General  Level of Consciousness: awake, alert  and oriented  Airway & Oxygen Therapy: Patient Spontanous Breathing and Patient connected to face mask oxygen  Post-op Assessment: Report given to RN and Post -op Vital signs reviewed and stable  Post vital signs: Reviewed and stable  Last Vitals:  Vitals Value Taken Time  BP 132/72 10/30/20 1423  Temp    Pulse 97 10/30/20 1426  Resp 23 10/30/20 1426  SpO2 96 % 10/30/20 1426  Vitals shown include unvalidated device data.  Last Pain:  Vitals:   10/30/20 0946  TempSrc: Oral         Complications: No notable events documented.

## 2020-10-30 NOTE — Interval H&P Note (Signed)
History and Physical Interval Note:  10/30/2020 12:15 PM  Joe Hill  has presented today for surgery, with the diagnosis of Left Upper Lobe Mass.  The various methods of treatment have been discussed with the patient and family. After consideration of risks, benefits and other options for treatment, the patient has consented to  Procedure(s): VIDEO BRONCHOSCOPY WITH ENDOBRONCHIAL NAVIGATION (N/A) VIDEO BRONCHOSCOPY WITH ENDOBRONCHIAL ULTRASOUND (N/A) as a surgical intervention.  The patient's history has been reviewed, patient examined, no change in status, stable for surgery.  I have reviewed the patient's chart and labs.  Questions were answered to the patient's satisfaction.     Joe Hill

## 2020-10-30 NOTE — Discharge Instructions (Signed)
Discharge Instructions:  1. Activities as tolerated, no heavy lifting or exertion  for 2 days Call office [phone] for any questions/problems related to procedure

## 2020-10-30 NOTE — Brief Op Note (Signed)
10/30/2020  2:35 PM  PATIENT:  Arthor Captain  80 y.o. male  PRE-OPERATIVE DIAGNOSIS:  Left Upper Lobe Mass, suspected stage IV lung cancer  POST-OPERATIVE DIAGNOSIS:  Left Upper Lobe Mass, Stage IV non-small cell carcinoma  PROCEDURE:  Procedure(s): VIDEO BRONCHOSCOPY WITH ENDOBRONCHIAL NAVIGATION (N/A) VIDEO BRONCHOSCOPY WITH ENDOBRONCHIAL ULTRASOUND (N/A) Needle aspirations, brushings and transbronchial biopsies  SURGEON:  Surgeon(s) and Role:    * Melrose Nakayama, MD - Primary  PHYSICIAN ASSISTANT:   ASSISTANTS: none   ANESTHESIA:   general  EBL:  0 mL   BLOOD ADMINISTERED:none  DRAINS: none   LOCAL MEDICATIONS USED:  NONE  SPECIMEN:  Source of Specimen:  4R and 4L lymph nodes, LUL mass  DISPOSITION OF SPECIMEN:  PATHOLOGY  COUNTS:  NO endoscopic  TOURNIQUET:  * No tourniquets in log *  DICTATION: .Other Dictation: Dictation Number -  PLAN OF CARE:  Cardiology consultation in PACU  PATIENT DISPOSITION:  PACU - hemodynamically stable.   Delay start of Pharmacological VTE agent (>24hrs) due to surgical blood loss or risk of bleeding: yes  Went into atrial fibrillation on induction. Will consult Cardiology

## 2020-10-30 NOTE — H&P (Signed)
PCP is [name], MD Referring Provider is Rozetta Nunnery, *       Chief Complaint  Patient presents with   Lung Lesion      Initial surgical consult, CT chest 8/10      HPI: [name] is sent for consultation regarding a left upper lobe lung mass with mediastinal adenopathy.   Joe Hill is an 80 year old man with history of tobacco abuse, COPD hypertension, hyperglycemia, prostate cancer, and reflux.  He also has a history of bilateral adrenal masses noted in 2011.  He was in his usual state of health until June.  He woke up on Monday morning noticed his voice is very raspy.  He has developed progressive hoarseness from that.  He was referred to Dr. Lucia Gaskins of ENT.  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  CT of the neck and chest showed a left apical lung mass with mediastinal adenopathy.  For the most part he is quit smoking.  Still smoking 1 to 2 cigarettes a day.  He says he smokes when he gets bored.  He denies any chest pain, pressure, or tightness.  No hemoptysis.  He does have a cough but that has not changed recently.  No change in appetite or weight loss.  No shoulder or arm pain but has noticed some neck pain when he looks down.   Zubrod Score: At the time of surgery this patient's most appropriate activity status/level should be described as: [x]     0    Normal activity, no symptoms []     1    Restricted in physical strenuous activity but ambulatory, able to do out light work []     2    Ambulatory and capable of self care, unable to do work activities, up and about >50 % of waking hours                              []     3    Only limited self care, in bed greater than 50% of waking hours []     4    Completely disabled, no self care, confined to bed or chair []     5    Moribund       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   COLONIC POLYPS, HX OF 10/11/2006   COPD 03/31/2009   EMPHYSEMA 10/16/2009   GERD 09/09/2009    HEMOPTYSIS UNSPECIFIED 07/30/2009   HYPERGLYCEMIA 08/12/2009   HYPERTENSION 08/04/2007   PROSTATE CANCER, 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   TOBACCO USE 01/30/2007           Past Surgical History:  Procedure Laterality Date   APPENDECTOMY       CATARACT EXTRACTION       PROSTATE SURGERY        prostatectomy           Family History  Problem Relation Age of Onset   Hyperlipidemia Mother     Cancer Father          lung, smoker   Cancer Sister          breast, smoker   Arthritis Maternal Aunt        Social History Social History         Tobacco Use   Smoking status: Every Day      Packs/day: 0.50      Types: Cigarettes   Smokeless  tobacco: Never  Substance Use Topics   Alcohol use: No      Alcohol/week: 0.0 standard drinks   Drug use: No            Current Outpatient Medications  Medication Sig Dispense Refill   amLODipine (NORVASC) 5 MG tablet TAKE 1 TABLET BY MOUTH EVERY DAY 90 tablet 3    No current facility-administered medications for this visit.          Allergies  Allergen Reactions   Penicillins Hives      Review of Systems  Constitutional:  Negative for activity change, fatigue and unexpected weight change.  HENT:  Positive for voice change. Negative for trouble swallowing.   Respiratory:  Positive for cough. Negative for shortness of breath and wheezing.   Cardiovascular:  Negative for chest pain and leg swelling.    BP 135/83 (BP Location: Right Arm, Patient Position: Sitting)   Pulse 84   Resp 20   Ht 5\' 10"  (1.778 m)   Wt 188 lb (85.3 kg)   SpO2 95% Comment: RA  BMI 26.98 kg/m  Physical Exam Vitals reviewed.  Constitutional:      General: He is not in acute distress.    Appearance: Normal appearance.  HENT:     Head: Normocephalic and atraumatic.  Eyes:     General: No scleral icterus.    Extraocular Movements: Extraocular movements intact.  Cardiovascular:     Rate and Rhythm: Normal rate and regular rhythm.     Heart sounds: Normal heart sounds.  No murmur heard. Pulmonary:     Effort: No respiratory distress.     Breath sounds: Normal breath sounds. No wheezing or rales.  Abdominal:     General: There is no distension.     Palpations: Abdomen is soft.  Musculoskeletal:     Cervical back: Neck supple.  Lymphadenopathy:     Cervical: No cervical adenopathy.  Skin:    General: Skin is warm and dry.  Neurological:     General: No focal deficit present.     Mental Status: He is alert and oriented to person, place, and time.     Cranial Nerves: No cranial nerve deficit.     Motor: No weakness.        Diagnostic Tests: CT CHEST WITH CONTRAST   TECHNIQUE: Multidetector CT imaging of the chest was performed during intravenous contrast administration.   CONTRAST:  68mL ISOVUE-300 IOPAMIDOL (ISOVUE-300) INJECTION 61%   COMPARISON:  10/15/2013   FINDINGS: Cardiovascular: Coronary, aortic arch, and branch vessel atherosclerotic vascular disease.   Mediastinum/Nodes: A medial left upper lobe mass invades the mediastinum in the vicinity of the AP window, with the mass in the region of the mediastinal invasion measuring 4.0 by 3.1 cm on image 67 series 4, and located about 1.9 cm from the trachea and about 3 cm from the carina.   Small right paratracheal lymph nodes are present. Right infrahilar node 0.8 cm in short axis, image 97 series 4. Pleural-based tumor versus left internal mammary chain lymph node 1.0 by 1.9 cm on image 51 series 4.   Lungs/Pleura: Marginally continuous with the lesion invading the AP window, a 3.2 by 2.0 by 2.6 cm medial left upper lobe mass is present abutting the pleural and mediastinal margins on image 57 series 9. Surrounding interstitial accentuation possibly reflecting mild surrounding lymphangitic tumor spread.   Biapical pleuroparenchymal scarring. Severe centrilobular emphysema. 3 mm subpleural nodule in the right upper lobe on image 37  series 7 is stable from 2015 and considered  benign. Confluent nodularity anteriorly in the right upper lobe measuring about 1.1 by 0.6 cm on image 56 series 7 is new and could be related to adjacent atelectasis, but a neoplastic pulmonary nodule is not readily excluded.   Airway thickening is present, suggesting bronchitis or reactive airways disease. Mild scarring or atelectasis in the lower lingula. Mild peripheral interstitial accentuation at the lung bases.   Upper Abdomen: Small hypodense lesions in the dome of the right hepatic lobe and anteriorly in the left hepatic lobe are similar to the prior exam. A 0.7 cm hypodense lesion posteriorly in the left hepatic lobe on image 145 series 4 is not well seen previously and technically nonspecific although statistically likely to be benign. There calcifications along the gallbladder fossa presumably from gallstones in a gallbladder. Low-density nodular fullness of both adrenal glands was present previously and most likely from adrenal adenomas. Cystic lesions of the left kidney upper pole are present previously and most likely benign cysts. Single punctate bilateral calcifications along the renal hila could represent nonobstructive renal calculi.   Musculoskeletal: Thoracic spondylosis. Degenerative sternoclavicular arthropathy bilaterally.   IMPRESSION: 1. Suspected left lung malignancy with a central mass invading the mediastinum measuring 4.0 cm in long axis, tangential to a 3.2 cm medial left upper lobe mass abutting the mediastinal and anterior pleural surfaces. There is also a suspected tumor nodule along the left anterior upper pleura, versus an adjacent internal mammary lymph node. No significant pleural effusion at this time. 2. Nodularity along likely scarring in the right upper lobe 1.1 by 0.6 cm, cannot exclude neoplastic pulmonary nodule in this location. 3. Other imaging findings of potential clinical significance: Aortic Atherosclerosis (ICD10-I70.0) and  Emphysema (ICD10-J43.9). Coronary atherosclerosis. Biapical scarring. Airway thickening is present, suggesting bronchitis or reactive airways disease. Suspected gallstones. Bilateral nonobstructive nephrolithiasis. Low-density adrenal masses are similar to 9/20 1/15 and likely adenomas.     Electronically Signed   By: Van Clines M.D.   On: 09/03/2020 12:34   I personally reviewed the CT images.  There is a left apical mass with significant AP window adenopathy and also an enlarged internal mammary node.  Emphysema noted as well.   Impression: Joe Hill is an 80 year old man with a history of tobacco abuse, COPD hypertension, hyperglycemia, prostate cancer, and reflux.  He presented with hoarseness and was found to have a paralyzed left vocal cord.  Work-up revealed a left apical mass with mediastinal adenopathy.  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  The apical mass does abut the chest wall.  He may have T3 disease.  He does not have any pain that is definitive for chest wall involvement.  He needs a PET/CT and MRI of the brain to complete his radiographic staging.  He then will need a biopsy for diagnostic and staging purposes.  I recommended we proceed with PET/CT first to make sure there is no other site that would be easier to biopsy.  If not, we would then proceed with navigational bronchoscopy and endobronchial ultrasound for diagnosis and staging.  I described the proposed procedure to Mr. Riches and his son.  They understand we will do this in the operating room under general anesthesia.  It is an endoscopic procedure.  We would plan to do it as an outpatient.  I informed him of the indications, risks, benefits, and alternatives.  They understand the risks include, but not limited to bleeding, pneumothorax,  failure to make a diagnosis, as well as those risks associated with general anesthesia.   Plan: PET/CT to guide initial diagnostic work-up  of new left lung mass. MRI of the brain to rule out brain metastases.  Navigational bronchoscopy and endobronchial ultrasound for diagnosis and staging.  We will call to schedule once we know when the PET/CT will be done.   Melrose Nakayama, MD [facility] [phone]          Electronically signed by Melrose Nakayama, MD at 09/25/2020  5:01 PM No interval change  MRI- no intracranial mets  PET NUCLEAR MEDICINE PET SKULL BASE TO THIGH   TECHNIQUE: 9.4 mCi F-18 FDG was injected intravenously. Full-ring PET imaging was performed from the skull base to thigh after the radiotracer. CT data was obtained and used for attenuation correction and anatomic localization.   Fasting blood glucose: 147 mg/dl   COMPARISON:  09/03/2020 chest CT.   FINDINGS: Mediastinal blood pool activity: SUV max 2.7   Liver activity: SUV max NA   NECK: No hypermetabolic lymph nodes in the neck. Asymmetric curvilinear right vocal colored hypermetabolism, favor activity related uptake with left vocal cord paralysis.   Incidental CT findings: none   CHEST:   Hypermetabolic 3.5 x 2.5 cm anteromedial left upper lobe solid lung mass with max SUV 17.1 (series 8/image 22).   Hypermetabolic 1.2 cm solid anteromedial right upper lobe pulmonary nodule with max SUV 10.2 (series 8/image 21).   Infiltrative hypermetabolic 4.4 x 2.9 cm AP window mediastinal mass with max SUV 13.5 (series 4/image 71). Hypermetabolic 0.9 cm right paratracheal node with max SUV 6.5 (series 4/image 61). Hypermetabolic 0.9 cm left mediastinal node along the anterior proximal left bronchus with max SUV 6.7 (series 4/image 70).   Hypermetabolic 1.9 cm high left internal mammary node with max SUV 8.6 (series 4/image 64). No enlarged or hypermetabolic axillary nodes. No hypermetabolic right hilar nodes.   Incidental CT findings: Coronary atherosclerosis. Atherosclerotic nonaneurysmal thoracic  aorta. Severe centrilobular emphysema with diffuse bronchial wall thickening.   ABDOMEN/PELVIS:   Hypermetabolic 1.6 cm left adrenal nodule with max SUV 16.0 (series 4/image 116).   No abnormal hypermetabolic activity within the liver, pancreas, right adrenal gland, or spleen. No hypermetabolic lymph nodes in the abdomen or pelvis.   Incidental CT findings: 2 scattered subcentimeter hypodense liver lesions, too small to characterize. Cholelithiasis. Atherosclerotic abdominal aorta with dilated 2.9 cm infrarenal abdominal aorta. Simple bilateral renal cysts, largest 3.2 cm in the posterior upper left kidney. Vasectomy clips bilaterally. Apparent prostatectomy.   SKELETON: No focal hypermetabolic activity to suggest skeletal metastasis.   Incidental CT findings: none   IMPRESSION: 1. Hypermetabolic 3.5 cm anteromedial left upper lobe solid lung mass, compatible with primary bronchogenic carcinoma. 2. Hypermetabolic 1.2 cm solid pulmonary nodule in the anteromedial right upper lobe, compatible with contralateral pulmonary metastasis. 3. Widespread hypermetabolic metastatic adenopathy to the bilateral mediastinum. 4. Hypermetabolic left adrenal metastasis. 5. Asymmetric right vocal cord curvilinear FDG uptake, favored to be activity related with left vocal cord paralysis. 6. Dilated 2.9 cm infrarenal abdominal aorta. Recommend follow-up every 5 years. Reference: J Am Coll Radiol 0938;18:299-371. 7. Chronic findings include: Aortic Atherosclerosis (ICD10-I70.0) and Emphysema (ICD10-J43.9). Coronary atherosclerosis. Cholelithiasis.     Electronically Signed   By: Ilona Sorrel M.D.   On: 10/16/2020 09:43  Revonda Standard. Roxan Hockey, MD [facility] [phone]

## 2020-10-31 ENCOUNTER — Encounter (HOSPITAL_COMMUNITY): Payer: Self-pay | Admitting: Thoracic Surgery (Cardiothoracic Vascular Surgery)

## 2020-10-31 NOTE — Anesthesia Postprocedure Evaluation (Signed)
Anesthesia Post Note  Patient: Joe Hill  Procedure(s) Performed: VIDEO BRONCHOSCOPY WITH ENDOBRONCHIAL NAVIGATION VIDEO BRONCHOSCOPY WITH ENDOBRONCHIAL ULTRASOUND     Patient location during evaluation: PACU Anesthesia Type: General Level of consciousness: sedated and patient cooperative Pain management: pain level controlled Vital Signs Assessment: post-procedure vital signs reviewed and stable Respiratory status: spontaneous breathing Cardiovascular status: stable Anesthetic complications: no   No notable events documented.  Last Vitals:  Vitals:   10/30/20 1655 10/30/20 1730  BP:  114/89  Pulse: 83 77  Resp: 20 16  Temp:  36.5 C  SpO2: 100% 100%    Last Pain:  Vitals:   10/30/20 1730  TempSrc:   PainSc: 0-No pain                 Nolon Nations

## 2020-10-31 NOTE — Op Note (Signed)
NAME: PAXTEN, APPELT MEDICAL RECORD NO: [id number] ACCOUNT NO: [id number] DATE OF BIRTH: 1940/07/28 FACILITY: [facility] LOCATION: [facility] PHYSICIAN: Revonda Standard. Roxan Hockey, MD  Operative Report   DATE OF PROCEDURE: 10/30/2020  PREOPERATIVE DIAGNOSIS:  Left upper lobe mass with suspected stage IV lung cancer.  POSTOPERATIVE DIAGNOSIS:  Non-small cell carcinoma, clinical stage IV.  PROCEDURE:  Electromagnetic navigational bronchoscopy with brushings and transbronchial biopsies and endobronchial ultrasound with mediastinal lymph node aspirations.  SURGEON:  Revonda Standard. Roxan Hockey, MD  ASSISTANT:  None.  ANESTHESIA:  General.  FINDINGS:  Aspirations of 4R node showed non-small cell carcinoma.  CLINICAL NOTE:  Mr. Betzer is an 80 year old man with a history of tobacco abuse, who presented with hoarseness.  He was found to have a paralyzed left vocal cord.  CT showed a left lung mass and some mediastinal adenopathy.  A PET/CT showed the mass  was hypermetabolic.  There was hypermetabolic lymphadenopathy in the mediastinum as well as internal mammary nodes and a likely contralateral lung metastasis.  He was advised to undergo navigational bronchoscopy and endobronchial ultrasound for  diagnosis.  The indications, risks, benefits, and alternatives were discussed in detail with the patient.  He understood and accepted the risks and agreed to proceed.  DESCRIPTION OF PROCEDURE:  Mr. Vacca was brought to the operating room on 10/30/2020.  He had induction of general anesthesia.  He went into atrial fibrillation with mildly elevated heart rate on induction.  He was given a bolus of amiodarone and  started on an infusion.  Decision was made to proceed with the procedure, so that the biopsies could be obtained and appropriate treatment instituted for his lung cancer.  A timeout was performed.  Flexible fiberoptic bronchoscopy was performed via the endotracheal tube.  It revealed normal endobronchial  anatomy with no endobronchial lesions to the level of the subsegmental bronchi.  The bronchoscope was removed.  Endobronchial ultrasound probe then was advanced.  Areas of interest were the 4R node anteriorly and then the 4L node near the bifurcation of the right and left mainstem. The 4R node was identified.  Needle aspiration was performed.  Realtime ultrasound  visualization was used during the aspirations.  Aspirations were obtained, both with and without suction applied. Three aspirations were performed for all nodes that were being evaluated.  The scope was advanced to the carina and proximally on the  left mainstem bronchus a 4L node was identified.  Multiple aspirations were obtained from this node as well.  There was some mild bleeding with aspiration of this node.  Topical epinephrine was applied. The 4R node aspirations were deemed  adequate for diagnosis.  The scope was withdrawn back to that site and additional aspirations were performed with all of the material being placed into a cytologic preparation fluid for cell block.  The endobronchial ultrasound probe was removed.  The bronchoscope was replaced.  The locatable guide for navigation was placed.  The planning had been done on the console prior to induction.  Registration was performed.  There was good correlation of the video and virtual bronchoscopy.  The  bronchoscope was directed to the left upper lobe bronchus and the appropriate subsegmental bronchus was cannulated.  We were able to see the catheter in very close proximity to the lesion, but never direct alignment with the center of the lesion.  It did appear that  there was alignment with the edge of the lesion.  Multiple biopsies were obtained.  Brushings then were performed, but mostly showed blood,  so multiple additional biopsies were obtained to try to obtain tissue for further testing.  There was some  bleeding with the biopsies and again dilute epinephrine was used to  irrigate.  There was no ongoing bleeding at the end of the procedure.  The patient then was extubated in the operating room and taken to the postanesthetic care unit in good condition.  Plan is to consult cardiology for atrial fibrillation while the patient is in the recovery room.   NIK D: 10/30/2020 5:42:25 pm T: 10/31/2020 2:09:00 am  JOB: [id number]/ [id number]

## 2020-10-31 NOTE — Progress Notes (Signed)
Location of tumor and Histology per Pathology Report: LUL lung  Biopsy:      Past/Anticipated interventions by surgeon, if any:   10/30/2020 PROCEDURE:  Electromagnetic navigational bronchoscopy with brushings and transbronchial biopsies and endobronchial ultrasound with mediastinal lymph node aspirations. SURGEON:  Revonda Standard. Roxan Hockey, MD  Past/Anticipated interventions by medical oncology, if any: Chemotherapy - not at this time    Pain issues, if any:  no   SAFETY ISSUES: Prior radiation? no Pacemaker/ICD? no Possible current pregnancy? no Is the patient on methotrexate? no  Current Complaints / other details:  patient reports hemoptysis, productive cough with clear to light yellow phlegm, hoarseness     Vitals:   11/05/20 1335  BP: 108/72  Pulse: 99  Resp: 20  Temp: 97.8 F (36.6 C)  SpO2: 99%  Weight: 183 lb 9.6 oz (83.3 kg)  Height: 5\' 10"  (1.778 m)

## 2020-11-03 LAB — CYTOLOGY - NON PAP

## 2020-11-03 LAB — SURGICAL PATHOLOGY

## 2020-11-04 ENCOUNTER — Other Ambulatory Visit: Payer: Self-pay

## 2020-11-04 ENCOUNTER — Encounter: Payer: Self-pay | Admitting: Student

## 2020-11-04 ENCOUNTER — Ambulatory Visit (INDEPENDENT_AMBULATORY_CARE_PROVIDER_SITE_OTHER): Payer: Medicare Other | Admitting: Student

## 2020-11-04 VITALS — BP 106/60 | HR 84 | Ht 70.0 in | Wt 183.2 lb

## 2020-11-04 DIAGNOSIS — I700 Atherosclerosis of aorta: Secondary | ICD-10-CM

## 2020-11-04 DIAGNOSIS — I10 Essential (primary) hypertension: Secondary | ICD-10-CM | POA: Diagnosis not present

## 2020-11-04 DIAGNOSIS — I4891 Unspecified atrial fibrillation: Secondary | ICD-10-CM | POA: Diagnosis not present

## 2020-11-04 LAB — CBC
Hematocrit: 44.5 % (ref 37.5–51.0)
Hemoglobin: 15.7 g/dL (ref 13.0–17.7)
MCH: 32.8 pg (ref 26.6–33.0)
MCHC: 35.3 g/dL (ref 31.5–35.7)
MCV: 93 fL (ref 79–97)
Platelets: 252 10*3/uL (ref 150–450)
RBC: 4.79 x10E6/uL (ref 4.14–5.80)
RDW: 12.1 % (ref 11.6–15.4)
WBC: 8.2 10*3/uL (ref 3.4–10.8)

## 2020-11-04 LAB — BASIC METABOLIC PANEL
BUN/Creatinine Ratio: 12 (ref 10–24)
BUN: 15 mg/dL (ref 8–27)
CO2: 24 mmol/L (ref 20–29)
Calcium: 9.6 mg/dL (ref 8.6–10.2)
Chloride: 99 mmol/L (ref 96–106)
Creatinine, Ser: 1.25 mg/dL (ref 0.76–1.27)
Glucose: 106 mg/dL — ABNORMAL HIGH (ref 70–99)
Potassium: 3.3 mmol/L — ABNORMAL LOW (ref 3.5–5.2)
Sodium: 137 mmol/L (ref 134–144)
eGFR: 58 mL/min/{1.73_m2} — ABNORMAL LOW (ref >59)

## 2020-11-04 NOTE — Progress Notes (Signed)
Radiation Oncology         [phone] ________________________________  Initial Outpatient Consultation  Name: Joe Hill MRN: [id number]  Date: 11/05/2020  DOB: 1940/09/21  NR:[id number], Brayton Mars, MD  [place] Nakayama, *   REFERRING PHYSICIAN: [place] Nakayama, *  DIAGNOSIS: The encounter diagnosis was Primary adenocarcinoma of upper lobe of left lung ([place]).  Adenocarcinoma of the left upper lobe, stage IV  HISTORY OF PRESENT ILLNESS::Joe Hill is a 80 y.o. male current smoker who is accompanied by his son. he is seen as a courtesy of Dr. Roxan Hockey for an opinion concerning radiation therapy as part of management for his recently diagnosed LUL adenocarcinoma. The patient presented to [facility] on 08/08/20 with a 1 week history of hoarseness, he was  previously given a steroid pack without resolution of symptoms.   Accordingly, the patient was referred to ENT and met with Dr. Lucia Gaskins on 08/28/20. Dr. 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 CT of the neck ordered on 09/03/20 demonstrated identical findings to laryngoscopy. However, chest CT on that same date demonstrated a left lung mass invading the mediastinum measuring 4.0 cm in the long axis; tangential to a 3.2 cm medial left upper lobe mass abutting the mediastinal and anterior pleural surfaces. A suspected tumor nodule was also seen along the left anterior upper pleura, versus an adjacent internal mammary lymph node. A nodularity was also seen along the right upper lobe measuring 1.1 x 0.6 cm; noted as possibly a neoplastic pulmonary nodule.  The patient then followed up with Dr. Lucia Gaskins on 09/17/20 to discuss these findings. Dr. 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 The patient reported  being able to eat and swallow without much difficulty.  He occasionally will get choked up when swallowing water.  Subsequently, the patient was referred to Dr. Roxan Hockey on 09/25/20 for further treatment. Dr. Roxan Hockey recommended PET/CT for initial diagnostic workup, and an MRI of the brain followed by navigational bronchoscopy and endobronchial ultrasound for diagnosis and staging.   MRI of the brain performed on 10/09/20 demonstrated no evidence of acute intracranial abnormality or intracranial metastatic disease.  PET scan performed on 10/15/20 demonstrated the hypermetabolic 3.5 cm anteromedial left upper lobe solid lung mass as compatible with primary bronchogenic carcinoma, and the hypermetabolic 1.2 cm solid pulmonary nodule in the anteromedial right upper lobe as compatible with contralateral pulmonary metastasis. Widespread hypermetabolic metastatic adenopathy was also seen to the bilateral mediastinum, as well as hypermetabolic left adrenal metastasis.   Accordingly, bronchoscopy with endobrachial biopsies performed on 10/30/20 by Dr. Roxan Hockey revealed: adenocarcinoma of the LUL, and malignant cells consistent with adenocarcinoma from level 4R lymph node.   Cancer History: history of prostate cancer in 2009, surgery alone for this issue.  PREVIOUS RADIATION THERAPY: No  PAST MEDICAL HISTORY:  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   TOBACCO USE 01/30/2007    PAST SURGICAL HISTORY: Past Surgical History:  Procedure Laterality Date   APPENDECTOMY     CATARACT EXTRACTION     PROSTATE SURGERY  prostatectomy   VIDEO BRONCHOSCOPY WITH ENDOBRONCHIAL NAVIGATION N/A 10/30/2020   Procedure: VIDEO BRONCHOSCOPY WITH  ENDOBRONCHIAL NAVIGATION;  Surgeon: Melrose Nakayama, MD;  Location: [facility];  Service: Thoracic;  Laterality: N/A;   VIDEO BRONCHOSCOPY WITH ENDOBRONCHIAL ULTRASOUND N/A 10/30/2020   Procedure: VIDEO BRONCHOSCOPY WITH ENDOBRONCHIAL ULTRASOUND;  Surgeon: Mel[place] Nakayama, MD;  Location: [facility];  Service: Thoracic;  Laterality: N/A;    FAMILY HISTORY:  Family History  Problem Relation Age of Onset   Hyperlipidemia Mother    Cancer Father        lung, smoker   Cancer Sister        breast, smoker   Arthritis Maternal Aunt     SOCIAL HISTORY:  Social History   Tobacco Use   Smoking status: Every Day    Packs/day: 0.25    Types: Cigarettes   Smokeless tobacco: Never  Vaping Use   Vaping Use: Never used  Substance Use Topics   Alcohol use: No    Alcohol/week: 0.0 standard drinks   Drug use: No    ALLERGIES:  Allergies  Allergen Reactions   Penicillins Hives    MEDICATIONS:  Current Outpatient Medications  Medication Sig Dispense Refill   apixaban (ELIQUIS) 5 MG TABS tablet Take 1 tablet (5 mg total) by mouth 2 (two) times daily. Start Saturday night with the pm dose. 60 tablet 11   dextromethorphan-guaiFENesin (MUCINEX DM) 30-600 MG 12hr tablet Take 1 tablet by mouth 2 (two) times daily as needed for cough.     diltiazem (CARDIZEM CD) 180 MG 24 hr capsule Take 1 capsule (180 mg total) by mouth daily. 30 capsule 11   No current facility-administered medications for this encounter.    REVIEW OF SYSTEMS:  A 10+ POINT REVIEW OF SYSTEMS WAS OBTAINED including neurology, dermatology, psychiatry, cardiac, respiratory, lymph, extremities, GI, GU, musculoskeletal, constitutional, reproductive, HEENT.  He continues to have hoarseness and occasionally will have some aspiration problems with water or other liquids.  He denies any pain within the chest area significant cough or hemoptysis.  He denies any breathing problems.  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   PHYSICAL EXAM:  height is '5\' 10"'  (1.778 m) and weight is 183 lb 9.6 oz (83.3 kg). His temperature is 97.8 F (36.6 C). His blood pressure is 108/72 and his pulse is 99. His respiration is 20 and oxygen saturation is 99%.   General: Alert and oriented, in no acute distress HEENT: Head is normocephalic. Extraocular movements are intact.  Neck: Neck is supple, no palpable cervical or supraclavicular lymphadenopathy. Heart: Regular in rate and rhythm with no murmurs, rubs, or gallops. Chest: Clear to auscultation bilaterally, with no rhonchi, wheezes, or rales. Abdomen: Soft, nontender, nondistended, with no rigidity or guarding. Extremities: No cyanosis or edema. Lymphatics: see Neck Exam Skin: No concerning lesions. Musculoskeletal: symmetric strength and muscle tone throughout. Neurologic: Cranial nerves II through XII are grossly intact. No obvious focalities. Speech is fluent. Coordination is intact. Psychiatric: Judgment and insight are intact. Affect is appropriate.   ECOG = 1  0 - Asymptomatic (Fully active, able to carry on all predisease activities without restriction)  1 - Symptomatic but completely ambulatory (Restricted in physically strenuous activity but ambulatory and able to carry out work of a light or sedentary nature. For example, light housework, office work)  2 - Symptomatic, <50% in bed during the day (Ambulatory and capable of all self care but unable to carry out any work  activities. Up and about more than 50% of waking hours)  3 - Symptomatic, >50% in bed, but not bedbound (Capable of only limited self-care, confined to bed or chair 50% or more of waking hours)  4 - Bedbound (Completely disabled. Cannot carry on any self-care. Totally confined to bed or chair)  5 - Death   Eustace Pen MM, Creech RH, Tormey DC, et al. [id number]). "Toxicity and response criteria of the [employer]". [place] Oncol. 5 (6): 649-55  LABORATORY DATA:   Lab Results  Component Value Date   WBC 8.2 11/04/2020   HGB 15.7 11/04/2020   HCT 44.5 11/04/2020   MCV 93 11/04/2020   PLT 252 11/04/2020   NEUTROABS 3,810 10/15/2019   Lab Results  Component Value Date   NA 137 11/04/2020   K 3.3 (L) 11/04/2020   CL 99 11/04/2020   CO2 24 11/04/2020   GLUCOSE 106 (H) 11/04/2020   CREATININE 1.25 11/04/2020   CALCIUM 9.6 11/04/2020      RADIOGRAPHY: DG Chest 2 View  Result Date: 10/30/2020 CLINICAL DATA:  Pre-op for video bronchoscopy with endobronchial navigation on 10/06. Patient denies all chest complaints at this time. EXAM: CHEST - 2 VIEW COMPARISON:  NM PET 10/15/2020.  CT Chest 09/03/2020. FINDINGS: Normal heart size. Grossly unchanged 3.5 cm paramediastinal anterior left upper lobe mass, best appreciated on view. Coarse interstitial markings with emphysematous changes. No focal consolidation, pleural effusion, or pneumothorax. Apparent nodular density at the right lung base is without correlate on the lateral view and is favored to reflect overlapping rib and interstitial markings rather than a discrete nodule given absence of any finding in this area on recent PET-CT. No acute osseous abnormality. Mild elevation of the left hemidiaphragm. IMPRESSION: 1. Grossly unchanged paramediastinal anterior left upper lobe mass. 2. No acute cardiopulmonary disease. 3. COPD. Electronically Signed   By: Titus Dubin M.D.   On: 10/30/2020 08:39   MR Brain W Wo Contrast  Result Date: 10/10/2020 CLINICAL DATA:  Lung cancer metastatic to brain. Lung mass, rule out brain metastases. Additional history provided by scanning technologist: Patient reports difficulty speaking since 07/15/2020, progressively worsening,. EXAM: MRI HEAD WITHOUT AND WITH CONTRAST TECHNIQUE: Multiplanar, multiecho pulse sequences of the brain and surrounding structures were obtained without and with intravenous contrast. CONTRAST:  60m MULTIHANCE GADOBENATE DIMEGLUMINE 529 MG/ML IV  SOLN COMPARISON:  Neck CT 09/03/2020. FINDINGS: Brain: Mild intermittent motion degradation. Moderate generalized cerebral atrophy. Comparatively mild cerebellar atrophy. 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 Background moderately severe multifocal T2/FLAIR hyperintensity within the cerebral white matter, nonspecific but compatible with chronic small vessel ischemic disease. There are a few nonspecific scattered punctate supratentorial chronic microhemorrhages. There is no acute infarct. No evidence of an intracranial mass. No extra-axial fluid collection. No midline shift. No pathologic intracranial enhancement is identified to suggest intracranial metastatic disease. Vascular: Maintained flow voids within the proximal large arterial vessels. Skull and upper cervical spine: No focal suspicious marrow lesion. Sinuses/Orbits: Visualized orbits show no acute finding. Bilateral lens replacements. Trace mucosal thickening within the right frontal, bilateral ethmoid, left sphenoid and left maxillary sinuses. Small right maxillary sinus mucous retention cyst. Other: Trace fluid within the right mastoid air cells. IMPRESSION: Mildly motion degraded exam. No evidence of acute intracranial abnormality or intracranial metastatic disease. 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 Background moderately severe chronic small vessel ischemic changes within the cerebral  white matter. Moderate generalized cerebral atrophy. Comparatively mild cerebellar atrophy. Electronically Signed   By: Kellie Simmering D.O.   On: 10/10/2020 16:05   NM PET Image Initial (PI) Skull Base To Thigh  Result Date: 10/16/2020 CLINICAL DATA:  Initial treatment strategy for left upper lobe lung mass. EXAM: NUCLEAR MEDICINE PET SKULL BASE TO THIGH TECHNIQUE: 9.4 mCi F-18 FDG was injected intravenously. Full-ring PET  imaging was performed from the skull base to thigh after the radiotracer. CT data was obtained and used for attenuation correction and anatomic localization. Fasting blood glucose: 147 mg/dl COMPARISON:  09/03/2020 chest CT. FINDINGS: Mediastinal blood pool activity: SUV max 2.7 Liver activity: SUV max NA NECK: No hypermetabolic lymph nodes in the neck. Asymmetric curvilinear right vocal colored hypermetabolism, favor activity related uptake with left vocal cord paralysis. Incidental CT findings: none CHEST: Hypermetabolic 3.5 x 2.5 cm anteromedial left upper lobe solid lung mass with max SUV 17.1 (series 8/image 22). Hypermetabolic 1.2 cm solid anteromedial right upper lobe pulmonary nodule with max SUV 10.2 (series 8/image 21). Infiltrative hypermetabolic 4.4 x 2.9 cm AP window mediastinal mass with max SUV 13.5 (series 4/image 71). Hypermetabolic 0.9 cm right paratracheal node with max SUV 6.5 (series 4/image 61). Hypermetabolic 0.9 cm left mediastinal node along the anterior proximal left bronchus with max SUV 6.7 (series 4/image 70). Hypermetabolic 1.9 cm high left internal mammary node with max SUV 8.6 (series 4/image 64). No enlarged or hypermetabolic axillary nodes. No hypermetabolic right hilar nodes. Incidental CT findings: Coronary atherosclerosis. Atherosclerotic nonaneurysmal thoracic aorta. Severe centrilobular emphysema with diffuse bronchial wall thickening. ABDOMEN/PELVIS: Hypermetabolic 1.6 cm left adrenal nodule with max SUV 16.0 (series 4/image 116). No abnormal hypermetabolic activity within the liver, pancreas, right adrenal gland, or spleen. No hypermetabolic lymph nodes in the abdomen or pelvis. Incidental CT findings: 2 scattered subcentimeter hypodense liver lesions, too small to characterize. Cholelithiasis. Atherosclerotic abdominal aorta with dilated 2.9 cm infrarenal abdominal aorta. Simple bilateral renal cysts, largest 3.2 cm in the posterior upper left kidney. Vasectomy clips  bilaterally. Apparent prostatectomy. SKELETON: No focal hypermetabolic activity to suggest skeletal metastasis. Incidental CT findings: none IMPRESSION: 1. Hypermetabolic 3.5 cm anteromedial left upper lobe solid lung mass, compatible with primary bronchogenic carcinoma. 2. Hypermetabolic 1.2 cm solid pulmonary nodule in the anteromedial right upper lobe, compatible with contralateral pulmonary metastasis. 3. Widespread hypermetabolic metastatic adenopathy to the bilateral mediastinum. 4. Hypermetabolic left adrenal metastasis. 5. Asymmetric right vocal cord curvilinear FDG uptake, favored to be activity related with left vocal cord paralysis. 6. Dilated 2.9 cm infrarenal abdominal aorta. Recommend follow-up every 5 years. Reference: J Am Coll Radiol 5852;77:824-235. 7. Chronic findings include: Aortic Atherosclerosis (ICD10-I70.0) and Emphysema (ICD10-J43.9). Coronary atherosclerosis. Cholelithiasis. Electronically Signed   By: Ilona Sorrel M.D.   On: 10/16/2020 09:43   DG C-ARM BRONCHOSCOPY  Result Date: 10/30/2020 C-ARM BRONCHOSCOPY: Fluoroscopy was utilized by the requesting physician.  No radiographic interpretation.      IMPRESSION: Adenocarcinoma of the left upper lobe, stage IV  The patient's PET scan did not show metastasis to the left adrenal gland.  There was also contralateral pulmonary nodule just lateral to the mediastinal area.  All the area within the chest could be encompassed by a reasonable radiation portal.  I discussed options in general with the patient including possible radiation therapy to the chest with SBRT to solitary adrenal lesion given that he has low burden oligometastasis.  We also discussed proceeding with systemic chemotherapy under the direction of Dr. Julien Nordmann.  We  discussed that given the time interval since initial development of vocal cord paralysis it would be highly unlikely that we would be be able to reverse his hoarseness.  Today, I talked to the patient and  son about the findings and work-up thus far.  We discussed the natural history of non-small cell lung cancer and general treatment, highlighting the role of radiotherapy in the management.  We discussed the available radiation techniques, and focused on the details of logistics and delivery.  We reviewed the anticipated acute and late sequelae associated with radiation in this setting.  The patient was encouraged to ask questions that I answered to the best of my ability.  PLAN: Final management pending input from medical oncology.  Addendum: I spoke with Dr. Julien Nordmann this afternoon and we reviewed the patient's imaging studies..  Dr. Julien Nordmann is recommending proceeding with systemic therapy and reserving radiation therapy for any residual problem areas after his full dose systemic therapy.   60 minutes of total time was spent for this patient encounter, including preparation, face-to-face counseling with the patient and coordination of care, physical exam, and documentation of the encounter.   ------------------------------------------------  Blair Promise, PhD, MD  This document serves as a record of services personally performed by Gery Pray, MD. It was created on his behalf by Roney Mans, a trained medical scribe. The creation of this record is based on the scribe's personal observations and the provider's statements to them. This document has been checked and approved by the attending provider.

## 2020-11-04 NOTE — Patient Instructions (Addendum)
Medication Instructions:  Your physician recommends that you continue on your current medications as directed. Please refer to the Current Medication list given to you today.  *If you need a refill on your cardiac medications before your next appointment, please call your pharmacy*   Lab Work: TODAY:  BMET & CBC  If you have labs (blood work) drawn today and your tests are completely normal, you will receive your results only by: [place] (if you have MyChart) OR A paper copy in the mail If you have any lab test that is abnormal or we need to change your treatment, we will call you to review the results.   Testing/Procedures: None ordered   Follow-Up: At [facility], you and your health needs are our priority.  As part of our continuing mission to provide you with exceptional heart care, we have created designated Provider Care Teams.  These Care Teams include your primary Cardiologist (physician) and Advanced Practice Providers (APPs -  Physician Assistants and Nurse Practitioners) who all work together to provide you with the care you need, when you need it.  We recommend signing up for the patient portal called "MyChart".  Sign up information is provided on this After Visit Summary.  MyChart is used to connect with patients for Virtual Visits (Telemedicine).  Patients are able to view lab/test results, encounter notes, upcoming appointments, etc.  Non-urgent messages can be sent to your provider as well.   To learn more about what you can do with MyChart, go to [email or web address].    Your next appointment:   2 month(s)  The format for your next appointment:   In Person  Provider:   Cristopher Peru, MD   Other Instructions

## 2020-11-04 NOTE — Progress Notes (Signed)
PCP:  [name], MD Primary Cardiologist: None Electrophysiologist: Cristopher Peru, MD   Joe Hill is a 80 y.o. male seen today for Cristopher Peru, MD for post hospital follow up.  Since discharge from hospital the patient reports doing well. He started his Eliquis on saturday evening and has not noted any bleeding. He had GI upset after starting on one of his medications, he thinks it may have been the diltiazem. He is sure that it is NOT the eliquis. He denies undue SOB or fatigue. No chest pain, palpitations, edema, early satiety, nausea, vomiting, or dizziness.   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   TOBACCO USE 01/30/2007   Past Surgical History:  Procedure Laterality Date   APPENDECTOMY     CATARACT EXTRACTION     PROSTATE SURGERY     prostatectomy   VIDEO BRONCHOSCOPY WITH ENDOBRONCHIAL NAVIGATION N/A 10/30/2020   Procedure: VIDEO BRONCHOSCOPY WITH ENDOBRONCHIAL NAVIGATION;  Surgeon: [place] Nakayama, MD;  Location: [facility];  Service: Thoracic;  Laterality: N/A;   VIDEO BRONCHOSCOPY WITH ENDOBRONCHIAL ULTRASOUND N/A 10/30/2020   Procedure: VIDEO BRONCHOSCOPY WITH ENDOBRONCHIAL ULTRASOUND;  Surgeon: [place] Nakayama, MD;  Location: [facility];  Service: Thoracic;  Laterality: N/A;    Current Outpatient Medications  Medication Sig Dispense Refill   apixaban (ELIQUIS) 5 MG TABS tablet Take 1 tablet (5 mg total) by mouth 2 (two) times daily. Start Saturday night with the pm dose. 60 tablet 11   dextromethorphan-guaiFENesin (MUCINEX DM) 30-600 MG 12hr tablet Take 1 tablet by mouth 2 (two) times daily as needed for cough.     diltiazem (CARDIZEM CD) 180 MG 24 hr capsule Take 1 capsule (180 mg total) by mouth  daily. 30 capsule 11   No current facility-administered medications for this visit.    Allergies  Allergen Reactions   Penicillins Hives    Social History   Socioeconomic History   Marital status: Single    Spouse name: Not on file   Number of children: Not on file   Years of education: Not on file   Highest education level: Not on file  Occupational History   Not on file  Tobacco Use   Smoking status: Every Day    Packs/day: 0.25    Types: Cigarettes   Smokeless tobacco: Never  Vaping Use   Vaping Use: Never used  Substance and Sexual Activity   Alcohol use: No    Alcohol/week: 0.0 standard drinks   Drug use: No   Sexual activity: Not on file  Other Topics Concern   Not on file  Social History Narrative   Divorced. 2 sons. 5 grandkids- 2 in college, 3 in hs in 2021.       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       HObbies: woodworking, taking care of yard, very active at work   Social Determinants of Radio broadcast assistant Strain: Not on file  Food Insecurity: Not on file  Transportation Needs: Not on file  Physical Activity: Not on file  Stress: Not on file  Social Connections: Not on file  Intimate Partner Violence: Not on file  Review of Systems: All other systems reviewed and are otherwise negative except as noted above.  Physical Exam: Vitals:   11/04/20 0801  BP: 106/60  Pulse: 84  SpO2: 98%  Weight: 183 lb 3.2 oz (83.1 kg)  Height: 5\' 10"  (1.778 m)    GEN- The patient is well appearing, alert and oriented x 3 today.  Hoarse. HEENT: normocephalic, atraumatic; sclera clear, conjunctiva pink; hearing intact; oropharynx clear; neck supple, no JVP Lymph- no cervical lymphadenopathy Lungs- Clear to ausculation bilaterally, normal work of breathing.  No wheezes, rales, rhonchi Heart- Irregularly irregular rate and rhythm, no murmurs, rubs or gallops, PMI not  laterally displaced GI- soft, non-tender, non-distended, bowel sounds present, no hepatosplenomegaly Extremities- no clubbing, cyanosis, or edema; DP/PT/radial pulses 2+ bilaterally MS- no significant deformity or atrophy Skin- warm and dry, no rash or lesion Psych- euthymic mood, full affect Neuro- strength and sensation are intact  EKG is ordered. Personal review of EKG from today shows Atrial fibrillation at 84 bpm  Additional studies reviewed include: Previous EP notes during admission  Assessment and Plan:  1. Persistent atrial fibrillation Remains in rate controlled AF.  Continue Eliquis for CHA2DS2/VASc of at least 5.  He thinks he may have stopped diltiazem. Will check when he gets up.  Briefly discussed the indications for rate vs rhythm control with him and his son. All agree that rate control is most appropriate for patient right now.  He follows up with oncologist and may require more procedures.   2. CA He is at high risk of bleeding and clotting Labs today.   RTC with Dr. Lovena Le 2 months. Sooner with APP if having symptoms of peripheral edema, new SOB, new fatigue, or tachy-palpitations/rapid HRs.   Shirley Friar, PA-C  11/04/20 8:34 AM

## 2020-11-05 ENCOUNTER — Ambulatory Visit
Admission: RE | Admit: 2020-11-05 | Discharge: 2020-11-05 | Disposition: A | Discharge status: Home / Self Care | Payer: Medicare Other | Source: Ambulatory Visit | Attending: Radiation Oncology | Admitting: Radiation Oncology

## 2020-11-05 ENCOUNTER — Encounter: Payer: Self-pay | Admitting: Radiation Oncology

## 2020-11-05 VITALS — BP 108/72 | HR 99 | Temp 97.8°F | Resp 20 | Ht 70.0 in | Wt 183.6 lb

## 2020-11-05 DIAGNOSIS — C3412 Malignant neoplasm of upper lobe, left bronchus or lung: Secondary | ICD-10-CM | POA: Insufficient documentation

## 2020-11-05 DIAGNOSIS — Z803 Family history of malignant neoplasm of breast: Secondary | ICD-10-CM | POA: Diagnosis not present

## 2020-11-05 DIAGNOSIS — R918 Other nonspecific abnormal finding of lung field: Secondary | ICD-10-CM | POA: Insufficient documentation

## 2020-11-05 DIAGNOSIS — Z86010 Personal history of colonic polyps: Secondary | ICD-10-CM | POA: Diagnosis not present

## 2020-11-05 DIAGNOSIS — J3801 Paralysis of vocal cords and larynx, unilateral: Secondary | ICD-10-CM | POA: Insufficient documentation

## 2020-11-05 DIAGNOSIS — R590 Localized enlarged lymph nodes: Secondary | ICD-10-CM | POA: Diagnosis not present

## 2020-11-05 DIAGNOSIS — Z8546 Personal history of malignant neoplasm of prostate: Secondary | ICD-10-CM | POA: Insufficient documentation

## 2020-11-05 DIAGNOSIS — F17210 Nicotine dependence, cigarettes, uncomplicated: Secondary | ICD-10-CM | POA: Diagnosis not present

## 2020-11-05 DIAGNOSIS — Z801 Family history of malignant neoplasm of trachea, bronchus and lung: Secondary | ICD-10-CM | POA: Diagnosis not present

## 2020-11-05 DIAGNOSIS — C7931 Secondary malignant neoplasm of brain: Secondary | ICD-10-CM | POA: Insufficient documentation

## 2020-11-05 DIAGNOSIS — C7972 Secondary malignant neoplasm of left adrenal gland: Secondary | ICD-10-CM | POA: Insufficient documentation

## 2020-11-05 DIAGNOSIS — J432 Centrilobular emphysema: Secondary | ICD-10-CM | POA: Diagnosis not present

## 2020-11-05 DIAGNOSIS — Z7901 Long term (current) use of anticoagulants: Secondary | ICD-10-CM | POA: Diagnosis not present

## 2020-11-05 DIAGNOSIS — I10 Essential (primary) hypertension: Secondary | ICD-10-CM | POA: Diagnosis not present

## 2020-11-05 DIAGNOSIS — C3492 Malignant neoplasm of unspecified part of left bronchus or lung: Secondary | ICD-10-CM | POA: Insufficient documentation

## 2020-11-05 DIAGNOSIS — K219 Gastro-esophageal reflux disease without esophagitis: Secondary | ICD-10-CM | POA: Diagnosis not present

## 2020-11-05 NOTE — Progress Notes (Signed)
See MD note for nursing evaluation. °

## 2020-11-11 ENCOUNTER — Encounter: Payer: Self-pay | Admitting: *Deleted

## 2020-11-11 ENCOUNTER — Telehealth: Payer: Self-pay | Admitting: *Deleted

## 2020-11-11 NOTE — Telephone Encounter (Signed)
Per Dr. Julien Nordmann, I called patient and scheduled him to be seen next week. Patient verbalized understanding of appt.  He would like me to send him an email about appt. I will send per his request.

## 2020-11-12 ENCOUNTER — Telehealth: Payer: Self-pay | Admitting: Student

## 2020-11-12 NOTE — Telephone Encounter (Signed)
Patient states that he was returning a new call

## 2020-11-14 ENCOUNTER — Encounter: Payer: Self-pay | Admitting: *Deleted

## 2020-11-14 NOTE — Progress Notes (Signed)
Per Dr. Julien Nordmann, I notified pathology to send PDL 1 on recent bx. Case numbers are [id number], [id number], and [id number].  Moleculars not requested due to pathology notification of not enough tissue.

## 2020-11-17 ENCOUNTER — Other Ambulatory Visit: Payer: Self-pay

## 2020-11-17 DIAGNOSIS — I4891 Unspecified atrial fibrillation: Secondary | ICD-10-CM

## 2020-11-17 DIAGNOSIS — I10 Essential (primary) hypertension: Secondary | ICD-10-CM

## 2020-11-17 MED ORDER — POTASSIUM CHLORIDE CRYS ER 20 MEQ PO TBCR: 20.0000 meq | EXTENDED_RELEASE_TABLET | Freq: Every day | ORAL | 3 refills | Status: DC

## 2020-11-18 ENCOUNTER — Inpatient Hospital Stay: Payer: Medicare Other | Attending: Internal Medicine | Admitting: Internal Medicine

## 2020-11-18 ENCOUNTER — Encounter: Payer: Self-pay | Admitting: Internal Medicine

## 2020-11-18 ENCOUNTER — Inpatient Hospital Stay: Payer: Medicare Other

## 2020-11-18 ENCOUNTER — Encounter: Payer: Self-pay | Admitting: *Deleted

## 2020-11-18 ENCOUNTER — Inpatient Hospital Stay (HOSPITAL_BASED_OUTPATIENT_CLINIC_OR_DEPARTMENT_OTHER): Payer: Medicare Other | Admitting: *Deleted

## 2020-11-18 ENCOUNTER — Other Ambulatory Visit: Payer: Self-pay

## 2020-11-18 VITALS — BP 121/76 | HR 80 | Temp 97.9°F | Resp 17 | Wt 185.1 lb

## 2020-11-18 DIAGNOSIS — Z7901 Long term (current) use of anticoagulants: Secondary | ICD-10-CM | POA: Insufficient documentation

## 2020-11-18 DIAGNOSIS — F17200 Nicotine dependence, unspecified, uncomplicated: Secondary | ICD-10-CM

## 2020-11-18 DIAGNOSIS — C3412 Malignant neoplasm of upper lobe, left bronchus or lung: Secondary | ICD-10-CM | POA: Diagnosis not present

## 2020-11-18 DIAGNOSIS — Z803 Family history of malignant neoplasm of breast: Secondary | ICD-10-CM | POA: Diagnosis not present

## 2020-11-18 DIAGNOSIS — R490 Dysphonia: Secondary | ICD-10-CM | POA: Diagnosis not present

## 2020-11-18 DIAGNOSIS — Z79899 Other long term (current) drug therapy: Secondary | ICD-10-CM | POA: Diagnosis not present

## 2020-11-18 DIAGNOSIS — Z5111 Encounter for antineoplastic chemotherapy: Secondary | ICD-10-CM | POA: Insufficient documentation

## 2020-11-18 DIAGNOSIS — C7972 Secondary malignant neoplasm of left adrenal gland: Secondary | ICD-10-CM | POA: Diagnosis not present

## 2020-11-18 DIAGNOSIS — C3490 Malignant neoplasm of unspecified part of unspecified bronchus or lung: Secondary | ICD-10-CM

## 2020-11-18 DIAGNOSIS — Z801 Family history of malignant neoplasm of trachea, bronchus and lung: Secondary | ICD-10-CM | POA: Diagnosis not present

## 2020-11-18 DIAGNOSIS — F17210 Nicotine dependence, cigarettes, uncomplicated: Secondary | ICD-10-CM | POA: Insufficient documentation

## 2020-11-18 DIAGNOSIS — Z8546 Personal history of malignant neoplasm of prostate: Secondary | ICD-10-CM | POA: Insufficient documentation

## 2020-11-18 DIAGNOSIS — C781 Secondary malignant neoplasm of mediastinum: Secondary | ICD-10-CM | POA: Diagnosis not present

## 2020-11-18 DIAGNOSIS — Z5112 Encounter for antineoplastic immunotherapy: Secondary | ICD-10-CM | POA: Insufficient documentation

## 2020-11-18 DIAGNOSIS — J3801 Paralysis of vocal cords and larynx, unilateral: Secondary | ICD-10-CM | POA: Diagnosis not present

## 2020-11-18 DIAGNOSIS — C3491 Malignant neoplasm of unspecified part of right bronchus or lung: Secondary | ICD-10-CM | POA: Diagnosis not present

## 2020-11-18 DIAGNOSIS — R5382 Chronic fatigue, unspecified: Secondary | ICD-10-CM

## 2020-11-18 LAB — CBC WITH DIFFERENTIAL (CANCER CENTER ONLY)
Abs Immature Granulocytes: 0.02 10*3/uL (ref 0.00–0.07)
Basophils Absolute: 0.1 10*3/uL (ref 0.0–0.1)
Basophils Relative: 1 %
Eosinophils Absolute: 0.1 10*3/uL (ref 0.0–0.5)
Eosinophils Relative: 2 %
HCT: 43.1 % (ref 39.0–52.0)
Hemoglobin: 15.3 g/dL (ref 13.0–17.0)
Immature Granulocytes: 0 %
Lymphocytes Relative: 18 %
Lymphs Abs: 1 10*3/uL (ref 0.7–4.0)
MCH: 32.3 pg (ref 26.0–34.0)
MCHC: 35.5 g/dL (ref 30.0–36.0)
MCV: 91.1 fL (ref 80.0–100.0)
Monocytes Absolute: 0.4 10*3/uL (ref 0.1–1.0)
Monocytes Relative: 8 %
Neutro Abs: 4.1 10*3/uL (ref 1.7–7.7)
Neutrophils Relative %: 71 %
Platelet Count: 199 10*3/uL (ref 150–400)
RBC: 4.73 MIL/uL (ref 4.22–5.81)
RDW: 12.9 % (ref 11.5–15.5)
WBC Count: 5.8 10*3/uL (ref 4.0–10.5)
nRBC: 0 % (ref 0.0–0.2)

## 2020-11-18 LAB — CMP (CANCER CENTER ONLY)
ALT: 16 U/L (ref 0–44)
AST: 16 U/L (ref 15–41)
Albumin: 3.9 g/dL (ref 3.5–5.0)
Alkaline Phosphatase: 101 U/L (ref 38–126)
Anion gap: 10 (ref 5–15)
BUN: 16 mg/dL (ref 8–23)
CO2: 27 mmol/L (ref 22–32)
Calcium: 8.9 mg/dL (ref 8.9–10.3)
Chloride: 102 mmol/L (ref 98–111)
Creatinine: 1.18 mg/dL (ref 0.61–1.24)
GFR, Estimated: >60 mL/min (ref >60)
Glucose, Bld: 160 mg/dL — ABNORMAL HIGH (ref 70–99)
Potassium: 3.3 mmol/L — ABNORMAL LOW (ref 3.5–5.1)
Sodium: 139 mmol/L (ref 135–145)
Total Bilirubin: 1.5 mg/dL — ABNORMAL HIGH (ref 0.3–1.2)
Total Protein: 6.7 g/dL (ref 6.5–8.1)

## 2020-11-18 MED ORDER — PROCHLORPERAZINE MALEATE 10 MG PO TABS: 10.0000 mg | ORAL_TABLET | Freq: Four times a day (QID) | ORAL | 0 refills | Status: AC | PRN

## 2020-11-18 MED ORDER — CYANOCOBALAMIN 1000 MCG/ML IJ SOLN
1000.0000 ug | Freq: Once | INTRAMUSCULAR | Status: AC
  Administered 2020-11-18: 1000 ug via INTRAMUSCULAR
  Filled 2020-11-18: qty 1

## 2020-11-18 MED ORDER — FOLIC ACID 1 MG PO TABS: 1.0000 mg | ORAL_TABLET | Freq: Every day | ORAL | 4 refills | Status: DC

## 2020-11-18 NOTE — Progress Notes (Signed)
START OFF PATHWAY REGIMEN - Non-Small Cell Lung   [id number]:Pembrolizumab 200 mg  IV D1 + Pemetrexed 500 mg/m2 IV D1 + Carboplatin AUC=5 IV D1 q21 Days:   A cycle is every 21 days:     Pembrolizumab      Pemetrexed      Carboplatin   **Always confirm dose/schedule in your pharmacy ordering system**  Patient Characteristics: Stage IV Metastatic, Nonsquamous, Did Not Order Molecular Analysis/Quantity Not Sufficient for Molecular Analysis Therapeutic Status: Stage IV Metastatic Histology: Nonsquamous Cell Broad Molecular Profiling Status: Quantity Not Sufficient for Molecular Analysis  Intent of Therapy: Non-Curative / Palliative Intent, Discussed with Patient

## 2020-11-18 NOTE — Progress Notes (Signed)
Oncology Nurse Navigator Documentation  Oncology Nurse Navigator Flowsheets 11/18/2020  Abnormal Finding Date 09/03/2020  Confirmed Diagnosis Date 10/30/2020  Diagnosis Status Pending Molecular Studies  Planned Course of Treatment Chemotherapy;Targeted Therapy  Navigator Follow Up Date: 11/20/2020  Navigator Follow Up Reason: Appointment Review  Navigator Location [facility]  Navigator Encounter Type Clinic/MDC;Initial MedOnc  Patient Visit Type Initial;MedOnc  Treatment Phase Pre-Tx/Tx Discussion  Barriers/Navigation Needs Education;Coordination of Care  Education Newly Diagnosed Cancer Education;Other  Interventions Coordination of Care;Psycho-Social Support;Education  Acuity Level 3-Moderate Needs (3-4 Barriers Identified)  Coordination of Care Appts  Education Method Written;Verbal  Time Spent with Patient 45

## 2020-11-18 NOTE — Progress Notes (Signed)
[facility] [phone]   [phone]  CONSULT NOTE  REFERRING PHYSICIAN: Dr. Modesto Charon  REASON FOR CONSULTATION:  80 years old white male recently diagnosed with lung cancer.  HPI Joe Hill is a 80 y.o. male with past medical history significant for hypertension, COPD, GERD, history of prostate cancer 15 years ago status postsurgical resection, dyslipidemia and long history of smoking.  The patient mentioned that on July 15, 2020 1 day after mowing the grass in the hot weather he started having some hoarseness of his voice.  It has been getting worse and he was seen by Dr. 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  The patient had CT scan of the chest on 09/03/2020 and that showed a suspected left lung malignancy with central mass invading the mediastinum and measuring 4.0 cm in long axis, tangential to 3.2 cm medial left upper lobe mass abutting the mediastinal and anterior pleural surfaces.  There was also a suspected tumor nodule along the left anterior upper pleura versus an adjacent internal mammary lymph node.  There was nodularity in the right upper lobe measuring 1.1 x 0.6 cm suspicious for malignancy.  The patient was referred to Dr. Roxan Hockey and MRI of the brain on 10/09/2020 showed no evidence of metastatic disease to the brain.  A PET scan was performed on 10/15/2020 and it showed hypermetabolic 3.5 cm anterior medial left upper lobe solid lung mass compatible with primary bronchogenic carcinoma.  There was also hypermetabolic 1.2 cm solid pulmonary nodule in the anterior medial right upper lobe compatible with a contralateral pulmonary metastasis.  There was also widespread hypermetabolic metastatic adenopathy to the bilateral mediastinum and hypermetabolic left adrenal metastasis.  The scan also showed asymmetric right vocal cord curvilinear FDG uptake  favored to be activity related to the left vocal cord paralysis. On October 30, 2020 the patient underwent electromagnetic navigation bronchoscopy with brushing and transbronchial biopsies as well as EBUS with mediastinal lymph node aspirations under the care of Dr. Roxan Hockey. The final pathology [id number]) was consistent with adenocarcinoma.  There was insufficient material for molecular studies. Dr. Roxan Hockey kindly referred the patient to me today for evaluation and recommendation regarding treatment of his condition. When seen today he is feeling fine except for the persistent hoarseness of his voice as well as cough but no significant shortness of breath or hemoptysis.  He denied having any chest pain.  He has occasional nausea with no vomiting, diarrhea or constipation.  He lost around 20 pounds in the last 4 months.  The patient denied having any headache or visual changes. Family history significant for father with lung cancer.  Mother had dyslipidemia and sister had breast cancer. The patient is single and has 2 sons.  He was accompanied by his son, Joe Hill.  He works as a Advertising account planner.  He has a history of smoking more than 1 pack/day for around 65 years and unfortunately he continues to smoke.  He has no history of alcohol or drug abuse.  HPI  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   TOBACCO USE 01/30/2007    Past Surgical  History:  Procedure Laterality Date   APPENDECTOMY     CATARACT EXTRACTION     PROSTATE SURGERY     prostatectomy   VIDEO BRONCHOSCOPY WITH ENDOBRONCHIAL NAVIGATION N/A 10/30/2020   Procedure: VIDEO BRONCHOSCOPY WITH ENDOBRONCHIAL NAVIGATION;  Surgeon: Melrose Nakayama, MD;  Location: [facility];   Service: Thoracic;  Laterality: N/A;   VIDEO BRONCHOSCOPY WITH ENDOBRONCHIAL ULTRASOUND N/A 10/30/2020   Procedure: VIDEO BRONCHOSCOPY WITH ENDOBRONCHIAL ULTRASOUND;  Surgeon: Melrose Nakayama, MD;  Location: [facility];  Service: Thoracic;  Laterality: N/A;    Family History  Problem Relation Age of Onset   Hyperlipidemia Mother    Cancer Father        lung, smoker   Cancer Sister        breast, smoker   Arthritis Maternal Aunt     Social History Social History   Tobacco Use   Smoking status: Every Day    Packs/day: 0.25    Types: Cigarettes   Smokeless tobacco: Never  Vaping Use   Vaping Use: Never used  Substance Use Topics   Alcohol use: No    Alcohol/week: 0.0 standard drinks   Drug use: No    Allergies  Allergen Reactions   Penicillins Hives    Current Outpatient Medications  Medication Sig Dispense Refill   apixaban (ELIQUIS) 5 MG TABS tablet Take 1 tablet (5 mg total) by mouth 2 (two) times daily. Start Saturday night with the pm dose. 60 tablet 11   diltiazem (CARDIZEM CD) 180 MG 24 hr capsule Take 1 capsule (180 mg total) by mouth daily. 30 capsule 11   potassium chloride SA (KLOR-CON) 20 MEQ tablet Take 1 tablet (20 mEq total) by mouth daily. Take 2 tablets (59meq) for 2 days then take 1 tablet (80meq) daily 90 tablet 3   dextromethorphan-guaiFENesin (MUCINEX DM) 30-600 MG 12hr tablet Take 1 tablet by mouth 2 (two) times daily as needed for cough. (Patient not taking: Reported on 11/18/2020)     No current facility-administered medications for this visit.    Review of Systems  Constitutional: positive for fatigue and weight loss Eyes: negative Ears, nose, mouth, throat, and face: positive for hoarseness Respiratory: positive for cough Cardiovascular: negative Gastrointestinal: positive for nausea Genitourinary:negative Integument/breast: negative Hematologic/lymphatic: negative Musculoskeletal:negative Neurological: negative Behavioral/Psych:  negative Endocrine: negative Allergic/Immunologic: negative  Physical Exam  OAC:ZYSAY, healthy, no distress, well nourished, and well developed SKIN: skin color, texture, turgor are normal, no rashes or significant lesions HEAD: Normocephalic, No masses, lesions, tenderness or abnormalities EYES: normal, PERRLA, Conjunctiva are pink and non-injected EARS: External ears normal, Canals clear OROPHARYNX:no exudate, no erythema, and lips, buccal mucosa, and tongue normal  NECK: supple, no adenopathy, no JVD LYMPH:  no palpable lymphadenopathy, no hepatosplenomegaly BREAST:not examined LUNGS: clear to auscultation , and palpation HEART: regular rate & rhythm, no murmurs, and no gallops ABDOMEN:abdomen soft, non-tender, normal bowel sounds, and no masses or organomegaly BACK: No CVA tenderness, Range of motion is normal EXTREMITIES:no joint deformities, effusion, or inflammation, no edema  NEURO: alert & oriented x 3 with fluent speech, no focal motor/sensory deficits  PERFORMANCE STATUS: ECOG 1  LABORATORY DATA: Lab Results  Component Value Date   WBC 5.8 11/18/2020   HGB 15.3 11/18/2020   HCT 43.1 11/18/2020   MCV 91.1 11/18/2020   PLT 199 11/18/2020      Chemistry      Component Value Date/Time   NA 137 11/04/2020 0840   K 3.3 (L) 11/04/2020 0840  CL 99 11/04/2020 0840   CO2 24 11/04/2020 0840   BUN 15 11/04/2020 0840   CREATININE 1.25 11/04/2020 0840   CREATININE 0.97 10/15/2019 0907      Component Value Date/Time   CALCIUM 9.6 11/04/2020 0840   ALKPHOS 76 10/28/2020 1135   AST 16 10/28/2020 1135   ALT 13 10/28/2020 1135   BILITOT 1.5 (H) 10/28/2020 1135       RADIOGRAPHIC STUDIES: DG Chest 2 View  Result Date: 10/30/2020 CLINICAL DATA:  Pre-op for video bronchoscopy with endobronchial navigation on 10/06. Patient denies all chest complaints at this time. EXAM: CHEST - 2 VIEW COMPARISON:  NM PET 10/15/2020.  CT Chest 09/03/2020. FINDINGS: Normal heart size.  Grossly unchanged 3.5 cm paramediastinal anterior left upper lobe mass, best appreciated on view. Coarse interstitial markings with emphysematous changes. No focal consolidation, pleural effusion, or pneumothorax. Apparent nodular density at the right lung base is without correlate on the lateral view and is favored to reflect overlapping rib and interstitial markings rather than a discrete nodule given absence of any finding in this area on recent PET-CT. No acute osseous abnormality. Mild elevation of the left hemidiaphragm. IMPRESSION: 1. Grossly unchanged paramediastinal anterior left upper lobe mass. 2. No acute cardiopulmonary disease. 3. COPD. Electronically Signed   By: Titus Dubin M.D.   On: 10/30/2020 08:39   DG C-ARM BRONCHOSCOPY  Result Date: 10/30/2020 C-ARM BRONCHOSCOPY: Fluoroscopy was utilized by the requesting physician.  No radiographic interpretation.    ASSESSMENT: This is a very pleasant 80 years old white male recently diagnosed with a stage IV (T2 a, N3, M1 B) non-small cell lung cancer, adenocarcinoma presented with large central left upper lobe lung mass in addition to bilateral mediastinal lymphadenopathy and right upper lobe metastatic nodule as well as left adrenal metastasis diagnosed in October 2022.  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   PLAN: I had a lengthy discussion with the patient and his son today about his current disease stage, prognosis and treatment options. I personally and independently reviewed the scan images and discussed the result and showed the images to the patient and his son. The biopsy has insufficient material for molecular testing and I recommended for the patient to have a blood test sent to [facility] for molecular studies. I explained to the patient that he has incurable condition and all the treatment will be of palliative nature. He  was giving the option of palliative care on hospice referral versus consideration of palliative treatment with either systemic chemotherapy with carboplatin for AUC of 5, Alimta 500 Mg/M2 and Keytruda 200 Mg IV every 3 weeks if he has no actionable mutations versus treatment with targeted therapy if the molecular studies showed any actionable mutations. The patient is interested in proceeding with systemic treatment and he is expected to start the first cycle of this treatment next week. I discussed with him the adverse effect of this treatment including but not limited to alopecia, myelosuppression, nausea and vomiting, peripheral neuropathy, liver or renal dysfunction as well as immunotherapy adverse effects. I will arrange for the patient to have a chemotherapy education class before the first dose of his treatment. The patient will receive vitamin B12 injection today. I will call his pharmacy with prescription for folic acid 1 mg p.o. daily in addition to Compazine 10 mg p.o. every 6 hours as needed for nausea. He will come back for follow-up  visit in 2 weeks for evaluation and management of any adverse effect of his treatment. After few cycles of the chemotherapy, may consider The patient for short course of palliative radiotherapy to the central left upper lobe lung mass and mediastinal lymphadenopathy because of the recurrent laryngeal nerve compression and vocal cord paralysis. For the smoking cessation, I strongly encouraged the patient to quit smoking. The patient was advised to call immediately if he has any other concerning symptoms in the interval.  The patient voices understanding of current disease status and treatment options and is in agreement with the current care plan.  All questions were answered. The patient knows to call the clinic with any problems, questions or concerns. We can certainly see the patient much sooner if necessary.  Thank you so much for allowing me to  participate in the care of BRENTTON WARDLOW. I will continue to follow up the patient with you and assist in his care.  The total time spent in the appointment was 90 minutes.  Disclaimer: This note was dictated with voice recognition software. Similar sounding words can inadvertently be transcribed and may not be corrected upon review.   Eilleen Kempf November 18, 2020, 2:27 PM

## 2020-11-18 NOTE — Patient Instructions (Addendum)
Steps to Quit Smoking Smoking tobacco is the leading cause of preventable death. It can affect almost every organ in the body. Smoking puts you and people around you at risk for many serious, long-lasting (chronic) diseases. Quitting smoking can be hard, but it is one of the best things that you can do for your health. It is never too late to quit. How do I get ready to quit? When you decide to quit smoking, make a plan to help you succeed. Before you quit: Pick a date to quit. Set a date within the next 2 weeks to give you time to prepare. Write down the reasons why you are quitting. Keep this list in places where you will see it often. Tell your family, friends, and co-workers that you are quitting. Their support is important. Talk with your doctor about the choices that may help you quit. Find out if your health insurance will pay for these treatments. Know the people, places, things, and activities that make you want to smoke (triggers). Avoid them. What first steps can I take to quit smoking? Throw away all cigarettes at home, at work, and in your car. Throw away the things that you use when you smoke, such as ashtrays and lighters. Clean your car. Make sure to empty the ashtray. Clean your home, including curtains and carpets. What can I do to help me quit smoking? Talk with your doctor about taking medicines and seeing a counselor at the same time. You are more likely to succeed when you do both. If you are pregnant or breastfeeding, talk with your doctor about counseling or other ways to quit smoking. Do not take medicine to help you quit smoking unless your doctor tells you to do so. To quit smoking: Quit right away Quit smoking totally, instead of slowly cutting back on how much you smoke over a period of time. Go to counseling. You are more likely to quit if you go to counseling sessions regularly. Take medicine You may take medicines to help you quit. Some medicines need a  prescription, and some you can buy over-the-counter. Some medicines may contain a drug called nicotine to replace the nicotine in cigarettes. Medicines may: Help you to stop having the desire to smoke (cravings). Help to stop the problems that come when you stop smoking (withdrawal symptoms). Your doctor may ask you to use: Nicotine patches, gum, or lozenges. Nicotine inhalers or sprays. Non-nicotine medicine that is taken by mouth. Find resources Find resources and other ways to help you quit smoking and remain smoke-free after you quit. These resources are most helpful when you use them often. They include: Online chats with a Social worker. Phone quitlines. Printed Furniture conservator/restorer. Support groups or group counseling. Text messaging programs. Mobile phone apps. Use apps on your mobile phone or tablet that can help you stick to your quit plan. There are many free apps for mobile phones and tablets as well as websites. Examples include Quit Guide from the [employer] and smokefree.gov  What things can I do to make it easier to quit?  Talk to your family and friends. Ask them to support and encourage you. Call a phone quitline (1-800-QUIT-NOW), reach out to support groups, or work with a Social worker. Ask people who smoke to not smoke around you. Avoid places that make you want to smoke, such as: Bars. Parties. Smoke-break areas at work. Spend time with people who do not smoke. Lower the stress in your life. Stress can make you want to  smoke. Try these things to help your stress: Getting regular exercise. Doing deep-breathing exercises. Doing yoga. Meditating. Doing a body scan. To do this, close your eyes, focus on one area of your body at a time from head to toe. Notice which parts of your body are tense. Try to relax the muscles in those areas. How will I feel when I quit smoking? Day 1 to 3 weeks Within the first 24 hours, you may start to have some problems that come from quitting tobacco.  These problems are very bad 2-3 days after you quit, but they do not often last for more than 2-3 weeks. You may get these symptoms: Mood swings. Feeling restless, nervous, angry, or annoyed. Trouble concentrating. Dizziness. Strong desire for high-sugar foods and nicotine. Weight gain. Trouble pooping (constipation). Feeling like you may vomit (nausea). Coughing or a sore throat. Changes in how the medicines that you take for other issues work in your body. Depression. Trouble sleeping (insomnia). Week 3 and afterward After the first 2-3 weeks of quitting, you may start to notice more positive results, such as: Better sense of smell and taste. Less coughing and sore throat. Slower heart rate. Lower blood pressure. Clearer skin. Better breathing. Fewer sick days. Quitting smoking can be hard. Do not give up if you fail the first time. Some people need to try a few times before they succeed. Do your best to stick to your quit plan, and talk with your doctor if you have any questions or concerns. Summary Smoking tobacco is the leading cause of preventable death. Quitting smoking can be hard, but it is one of the best things that you can do for your health. When you decide to quit smoking, make a plan to help you succeed. Quit smoking right away, not slowly over a period of time. When you start quitting, seek help from your doctor, family, or friends. This information is not intended to replace advice given to you by your health care provider. Make sure you discuss any questions you have with your health care provider. Document Revised: 10/06/2018 Document Reviewed: 04/01/2018 Elsevier Patient Education  [place]. Vitamin B12 Injection What is this medication? Vitamin B12 (VAHY tuh min B12) prevents and treats low vitamin B12 levels in your body. It is used in people who do not get enough vitamin B12 from their diet or when their digestive tract does not absorb enough. Vitamin  B12 plays an important role in maintaining the health of your nervous system and red blood cells. This medicine may be used for other purposes; ask your health care provider or pharmacist if you have questions. COMMON BRAND NAME(S): B-12 Compliance Kit, B-12 Injection Kit, Cyomin, Dodex, LA-12, Nutri-Twelve, Physicians EZ Use B-12, Primabalt What should I tell my care team before I take this medication? They need to know if you have any of these conditions: Kidney disease Leber's disease Megaloblastic anemia An unusual or allergic reaction to cyanocobalamin, cobalt, other medications, foods, dyes, or preservatives Pregnant or trying to get pregnant Breast-feeding How should I use this medication? This medication is injected into a muscle or deeply under the skin. It is usually given in a clinic or care team's office. However, your care team may teach you how to inject yourself. Follow all instructions. Talk to your care team about the use of this medication in children. Special care may be needed. Overdosage: If you think you have taken too much of this medicine contact a poison control center or emergency  room at once. NOTE: This medicine is only for you. Do not share this medicine with others. What if I miss a dose? If you are given your dose at a clinic or care team's office, call to reschedule your appointment. If you give your own injections, and you miss a dose, take it as soon as you can. If it is almost time for your next dose, take only that dose. Do not take double or extra doses. What may interact with this medication? Colchicine Heavy alcohol intake This list may not describe all possible interactions. Give your health care provider a list of all the medicines, herbs, non-prescription drugs, or dietary supplements you use. Also tell them if you smoke, drink alcohol, or use illegal drugs. Some items may interact with your medicine. What should I watch for while using this  medication? Visit your care team regularly. You may need blood work done while you are taking this medication. You may need to follow a special diet. Talk to your care team. Limit your alcohol intake and avoid smoking to get the best benefit. What side effects may I notice from receiving this medication? Side effects that you should report to your care team as soon as possible: Allergic reactions-skin rash, itching, hives, swelling of the face, lips, tongue, or throat Swelling of the ankles, hands, or feet Trouble breathing Side effects that usually do not require medical attention (report to your care team if they continue or are bothersome): Diarrhea This list may not describe all possible side effects. Call your doctor for medical advice about side effects. You may report side effects to FDA at 1-800-FDA-1088. Where should I keep my medication? Keep out of the reach of children. Store at room temperature between 15 and 30 degrees C (59 and 85 degrees F). Protect from light. Throw away any unused medication after the expiration date. NOTE: This sheet is a summary. It may not cover all possible information. If you have questions about this medicine, talk to your doctor, pharmacist, or health care provider.  2022 Elsevier/Gold Standard (2020-03-03 11:47:06)

## 2020-11-19 ENCOUNTER — Telehealth: Payer: Self-pay | Admitting: Internal Medicine

## 2020-11-19 NOTE — Telephone Encounter (Signed)
Scheduled follow-up appointments per 10/25 los. Patient is aware.

## 2020-11-20 NOTE — Progress Notes (Signed)
Pharmacist Chemotherapy Monitoring - Initial Assessment    Anticipated start date: 11/27/20   The following has been reviewed per standard work regarding the patient's treatment regimen: The patient's diagnosis, treatment plan and drug doses, and organ/hematologic function Lab orders and baseline tests specific to treatment regimen  The treatment plan start date, drug sequencing, and pre-medications Prior authorization status  Patient's documented medication list, including drug-drug interaction screen and prescriptions for anti-emetics and supportive care specific to the treatment regimen The drug concentrations, fluid compatibility, administration routes, and timing of the medications to be used The patient's access for treatment and lifetime cumulative dose history, if applicable  The patient's medication allergies and previous infusion related reactions, if applicable   Changes made to treatment plan:  N/A  Follow up needed:  Pending authorization for treatment    Kennith Center, Pharm.D., CPP 11/20/2020@12 :06 PM

## 2020-11-24 ENCOUNTER — Inpatient Hospital Stay: Payer: Medicare Other

## 2020-11-24 ENCOUNTER — Other Ambulatory Visit: Payer: Self-pay

## 2020-11-26 ENCOUNTER — Encounter: Payer: Self-pay | Admitting: *Deleted

## 2020-11-26 MED FILL — Dexamethasone Sodium Phosphate Inj 100 MG/10ML: INTRAMUSCULAR | Qty: 1 | Status: AC

## 2020-11-26 MED FILL — Fosaprepitant Dimeglumine For IV Infusion 150 MG (Base Eq): INTRAVENOUS | Qty: 5 | Status: AC

## 2020-11-26 NOTE — Progress Notes (Signed)
I followed up to check with [facility] that they have received [place] test and noted they have received.  According to the patient portal, the testing is in process at this time.

## 2020-11-27 ENCOUNTER — Other Ambulatory Visit: Payer: Self-pay

## 2020-11-27 ENCOUNTER — Inpatient Hospital Stay: Payer: Medicare Other | Attending: Internal Medicine

## 2020-11-27 ENCOUNTER — Encounter: Payer: Self-pay | Admitting: Internal Medicine

## 2020-11-27 ENCOUNTER — Inpatient Hospital Stay: Payer: Medicare Other

## 2020-11-27 VITALS — BP 117/67 | HR 87 | Temp 98.2°F | Resp 17 | Wt 179.8 lb

## 2020-11-27 DIAGNOSIS — I10 Essential (primary) hypertension: Secondary | ICD-10-CM | POA: Insufficient documentation

## 2020-11-27 DIAGNOSIS — Z8546 Personal history of malignant neoplasm of prostate: Secondary | ICD-10-CM | POA: Diagnosis not present

## 2020-11-27 DIAGNOSIS — C3412 Malignant neoplasm of upper lobe, left bronchus or lung: Secondary | ICD-10-CM | POA: Insufficient documentation

## 2020-11-27 DIAGNOSIS — R6883 Chills (without fever): Secondary | ICD-10-CM | POA: Diagnosis not present

## 2020-11-27 DIAGNOSIS — Z7901 Long term (current) use of anticoagulants: Secondary | ICD-10-CM | POA: Insufficient documentation

## 2020-11-27 DIAGNOSIS — C781 Secondary malignant neoplasm of mediastinum: Secondary | ICD-10-CM | POA: Diagnosis not present

## 2020-11-27 DIAGNOSIS — Z79899 Other long term (current) drug therapy: Secondary | ICD-10-CM | POA: Diagnosis not present

## 2020-11-27 DIAGNOSIS — E871 Hypo-osmolality and hyponatremia: Secondary | ICD-10-CM | POA: Insufficient documentation

## 2020-11-27 DIAGNOSIS — F17210 Nicotine dependence, cigarettes, uncomplicated: Secondary | ICD-10-CM | POA: Diagnosis not present

## 2020-11-27 DIAGNOSIS — Z5112 Encounter for antineoplastic immunotherapy: Secondary | ICD-10-CM | POA: Insufficient documentation

## 2020-11-27 DIAGNOSIS — Z5111 Encounter for antineoplastic chemotherapy: Secondary | ICD-10-CM | POA: Insufficient documentation

## 2020-11-27 DIAGNOSIS — Z5189 Encounter for other specified aftercare: Secondary | ICD-10-CM | POA: Diagnosis not present

## 2020-11-27 DIAGNOSIS — C7972 Secondary malignant neoplasm of left adrenal gland: Secondary | ICD-10-CM | POA: Insufficient documentation

## 2020-11-27 DIAGNOSIS — Z9079 Acquired absence of other genital organ(s): Secondary | ICD-10-CM | POA: Insufficient documentation

## 2020-11-27 DIAGNOSIS — R5382 Chronic fatigue, unspecified: Secondary | ICD-10-CM

## 2020-11-27 LAB — CBC WITH DIFFERENTIAL (CANCER CENTER ONLY)
Abs Immature Granulocytes: 0.01 10*3/uL (ref 0.00–0.07)
Basophils Absolute: 0 10*3/uL (ref 0.0–0.1)
Basophils Relative: 1 %
Eosinophils Absolute: 0.1 10*3/uL (ref 0.0–0.5)
Eosinophils Relative: 2 %
HCT: 43.5 % (ref 39.0–52.0)
Hemoglobin: 15.5 g/dL (ref 13.0–17.0)
Immature Granulocytes: 0 %
Lymphocytes Relative: 14 %
Lymphs Abs: 0.8 10*3/uL (ref 0.7–4.0)
MCH: 32.1 pg (ref 26.0–34.0)
MCHC: 35.6 g/dL (ref 30.0–36.0)
MCV: 90.1 fL (ref 80.0–100.0)
Monocytes Absolute: 0.7 10*3/uL (ref 0.1–1.0)
Monocytes Relative: 12 %
Neutro Abs: 3.8 10*3/uL (ref 1.7–7.7)
Neutrophils Relative %: 71 %
Platelet Count: 184 10*3/uL (ref 150–400)
RBC: 4.83 MIL/uL (ref 4.22–5.81)
RDW: 12.9 % (ref 11.5–15.5)
WBC Count: 5.4 10*3/uL (ref 4.0–10.5)
nRBC: 0 % (ref 0.0–0.2)

## 2020-11-27 LAB — CMP (CANCER CENTER ONLY)
ALT: 16 U/L (ref 0–44)
AST: 18 U/L (ref 15–41)
Albumin: 3.9 g/dL (ref 3.5–5.0)
Alkaline Phosphatase: 107 U/L (ref 38–126)
Anion gap: 10 (ref 5–15)
BUN: 15 mg/dL (ref 8–23)
CO2: 22 mmol/L (ref 22–32)
Calcium: 8.9 mg/dL (ref 8.9–10.3)
Chloride: 103 mmol/L (ref 98–111)
Creatinine: 1.21 mg/dL (ref 0.61–1.24)
GFR, Estimated: >60 mL/min (ref >60)
Glucose, Bld: 97 mg/dL (ref 70–99)
Potassium: 4.3 mmol/L (ref 3.5–5.1)
Sodium: 135 mmol/L (ref 135–145)
Total Bilirubin: 1.2 mg/dL (ref 0.3–1.2)
Total Protein: 6.8 g/dL (ref 6.5–8.1)

## 2020-11-27 LAB — TSH: TSH: 0.924 u[IU]/mL (ref 0.320–4.118)

## 2020-11-27 MED ORDER — SODIUM CHLORIDE 0.9 % IV SOLN
200.0000 mg | Freq: Once | INTRAVENOUS | Status: AC
  Administered 2020-11-27: 200 mg via INTRAVENOUS
  Filled 2020-11-27: qty 8

## 2020-11-27 MED ORDER — SODIUM CHLORIDE 0.9 % IV SOLN: Freq: Once | INTRAVENOUS | Status: AC

## 2020-11-27 MED ORDER — SODIUM CHLORIDE 0.9 % IV SOLN
150.0000 mg | Freq: Once | INTRAVENOUS | Status: AC
  Administered 2020-11-27: 150 mg via INTRAVENOUS
  Filled 2020-11-27: qty 150

## 2020-11-27 MED ORDER — SODIUM CHLORIDE 0.9 % IV SOLN
421.5000 mg | Freq: Once | INTRAVENOUS | Status: AC
  Administered 2020-11-27: 420 mg via INTRAVENOUS
  Filled 2020-11-27: qty 42

## 2020-11-27 MED ORDER — SODIUM CHLORIDE 0.9 % IV SOLN
500.0000 mg/m2 | Freq: Once | INTRAVENOUS | Status: AC
  Administered 2020-11-27: 1000 mg via INTRAVENOUS
  Filled 2020-11-27: qty 40

## 2020-11-27 MED ORDER — SODIUM CHLORIDE 0.9 % IV SOLN
10.0000 mg | Freq: Once | INTRAVENOUS | Status: AC
  Administered 2020-11-27: 10 mg via INTRAVENOUS
  Filled 2020-11-27: qty 10

## 2020-11-27 MED ORDER — PALONOSETRON HCL INJECTION 0.25 MG/5ML
0.2500 mg | Freq: Once | INTRAVENOUS | Status: AC
  Administered 2020-11-27: 0.25 mg via INTRAVENOUS
  Filled 2020-11-27: qty 5

## 2020-11-27 NOTE — Progress Notes (Signed)
Met with patient and accompanying adult at registration to introduce myself as Financial Resource Specialist and to offer available resources.  Discussed one-time $1000 [employer] grant and qualifications to assist with personal expenses while going through treatment.  Gave him my card if interested in applying and for any additional financial questions or concerns. 

## 2020-11-27 NOTE — Patient Instructions (Addendum)
[facility]  Discharge Instructions: Thank you for choosing [facility] to provide your oncology and hematology care.   If you have a lab appointment with the [facility], please go directly to the [facility] and check in at the registration area.   Wear comfortable clothing and clothing appropriate for easy access to any Portacath or PICC line.   We strive to give you quality time with your provider. You may need to reschedule your appointment if you arrive late (15 or more minutes).  Arriving late affects you and other patients whose appointments are after yours.  Also, if you miss three or more appointments without notifying the office, you may be dismissed from the clinic at the provider's discretion.      For prescription refill requests, have your pharmacy contact our office and allow 72 hours for refills to be completed.    Today you received the following chemotherapy and/or immunotherapy agents: Keytruda (pembrolizumab), Alimta (pemetrexed), Carboplatin.      To help prevent nausea and vomiting after your treatment, we encourage you to take your nausea medication as directed.  BELOW ARE SYMPTOMS THAT SHOULD BE REPORTED IMMEDIATELY: *FEVER GREATER THAN 100.4 F (38 C) OR HIGHER *CHILLS OR SWEATING *NAUSEA AND VOMITING THAT IS NOT CONTROLLED WITH YOUR NAUSEA MEDICATION *UNUSUAL SHORTNESS OF BREATH *UNUSUAL BRUISING OR BLEEDING *URINARY PROBLEMS (pain or burning when urinating, or frequent urination) *BOWEL PROBLEMS (unusual diarrhea, constipation, pain near the anus) TENDERNESS IN MOUTH AND THROAT WITH OR WITHOUT PRESENCE OF ULCERS (sore throat, sores in mouth, or a toothache) UNUSUAL RASH, SWELLING OR PAIN  UNUSUAL VAGINAL DISCHARGE OR ITCHING   Items with * indicate a potential emergency and should be followed up as soon as possible or go to the Emergency Department if any problems should occur.  Please show the CHEMOTHERAPY  ALERT CARD or IMMUNOTHERAPY ALERT CARD at check-in to the Emergency Department and triage nurse.  Should you have questions after your visit or need to cancel or reschedule your appointment, please contact [facility]  Dept: [phone]  and follow the prompts.  Office hours are 8:00 a.m. to 4:30 p.m. Monday - Friday. Please note that voicemails left after 4:00 p.m. may not be returned until the following business day.  We are closed weekends and major holidays. You have access to a nurse at all times for urgent questions. Please call the main number to the clinic Dept: [phone] and follow the prompts.   For any non-urgent questions, you may also contact your provider using MyChart. We now offer e-Visits for anyone 80 and older to request care online for non-urgent symptoms. For details visit [email or web address].   Also download the MyChart app! Go to the app store, search "MyChart", open the app, select [facility], and log in with your MyChart username and password.  Due to Covid, a mask is required upon entering the hospital/clinic. If you do not have a mask, one will be given to you upon arrival. For doctor visits, patients may have 1 support person aged 51 or older with them. For treatment visits, patients cannot have anyone with them due to current Covid guidelines and our immunocompromised population.   Pembrolizumab injection What is this medication? PEMBROLIZUMAB (pem broe liz ue mab) is a monoclonal antibody. It is used to treat certain types of cancer. This medicine may be used for other purposes; ask your health care provider or pharmacist if you have questions. COMMON  BRAND NAME(S): Keytruda What should I tell my care team before I take this medication? They need to know if you have any of these conditions: autoimmune diseases like Crohn's disease, ulcerative colitis, or lupus have had or planning to have an allogeneic stem cell transplant (uses  someone else's stem cells) history of organ transplant history of chest radiation nervous system problems like myasthenia gravis or Guillain-Barre syndrome an unusual or allergic reaction to pembrolizumab, other medicines, foods, dyes, or preservatives pregnant or trying to get pregnant breast-feeding How should I use this medication? This medicine is for infusion into a vein. It is given by a health care professional in a hospital or clinic setting. A special MedGuide will be given to you before each treatment. Be sure to read this information carefully each time. Talk to your pediatrician regarding the use of this medicine in children. While this drug may be prescribed for children as young as 6 months for selected conditions, precautions do apply. Overdosage: If you think you have taken too much of this medicine contact a poison control center or emergency room at once. NOTE: This medicine is only for you. Do not share this medicine with others. What if I miss a dose? It is important not to miss your dose. Call your doctor or health care professional if you are unable to keep an appointment. What may interact with this medication? Interactions have not been studied. This list may not describe all possible interactions. Give your health care provider a list of all the medicines, herbs, non-prescription drugs, or dietary supplements you use. Also tell them if you smoke, drink alcohol, or use illegal drugs. Some items may interact with your medicine. What should I watch for while using this medication? Your condition will be monitored carefully while you are receiving this medicine. You may need blood work done while you are taking this medicine. Do not become pregnant while taking this medicine or for 4 months after stopping it. Women should inform their doctor if they wish to become pregnant or think they might be pregnant. There is a potential for serious side effects to an unborn child. Talk  to your health care professional or pharmacist for more information. Do not breast-feed an infant while taking this medicine or for 4 months after the last dose. What side effects may I notice from receiving this medication? Side effects that you should report to your doctor or health care professional as soon as possible: allergic reactions like skin rash, itching or hives, swelling of the face, lips, or tongue bloody or black, tarry breathing problems changes in vision chest pain chills confusion constipation cough diarrhea dizziness or feeling faint or lightheaded fast or irregular heartbeat fever flushing joint pain low blood counts - this medicine may decrease the number of white blood cells, red blood cells and platelets. You may be at increased risk for infections and bleeding. muscle pain muscle weakness pain, tingling, numbness in the hands or feet persistent headache redness, blistering, peeling or loosening of the skin, including inside the mouth signs and symptoms of high blood sugar such as dizziness; dry mouth; dry skin; fruity breath; nausea; stomach pain; increased hunger or thirst; increased urination signs and symptoms of kidney injury like trouble passing urine or change in the amount of urine signs and symptoms of liver injury like dark urine, light-colored stools, loss of appetite, nausea, right upper belly pain, yellowing of the eyes or skin sweating swollen lymph nodes weight loss Side effects that usually  do not require medical attention (report to your doctor or health care professional if they continue or are bothersome): decreased appetite hair loss tiredness This list may not describe all possible side effects. Call your doctor for medical advice about side effects. You may report side effects to FDA at 1-800-FDA-1088. Where should I keep my medication? This drug is given in a hospital or clinic and will not be stored at home. NOTE: This sheet is a  summary. It may not cover all possible information. If you have questions about this medicine, talk to your doctor, pharmacist, or health care provider.  2022 Elsevier/Gold Standard (2018-12-13 21:44:53)  Pemetrexed injection What is this medication? PEMETREXED (PEM e TREX ed) is a chemotherapy drug used to treat lung cancers like non-small cell lung cancer and mesothelioma. It may also be used to treat other cancers. This medicine may be used for other purposes; ask your health care provider or pharmacist if you have questions. COMMON BRAND NAME(S): Alimta, PEMFEXY What should I tell my care team before I take this medication? They need to know if you have any of these conditions: infection (especially a virus infection such as chickenpox, cold sores, or herpes) kidney disease low blood counts, like low white cell, platelet, or red cell counts lung or breathing disease, like asthma radiation therapy an unusual or allergic reaction to pemetrexed, other medicines, foods, dyes, or preservative pregnant or trying to get pregnant breast-feeding How should I use this medication? This drug is given as an infusion into a vein. It is administered in a hospital or clinic by a specially trained health care professional. Talk to your pediatrician regarding the use of this medicine in children. Special care may be needed. Overdosage: If you think you have taken too much of this medicine contact a poison control center or emergency room at once. NOTE: This medicine is only for you. Do not share this medicine with others. What if I miss a dose? It is important not to miss your dose. Call your doctor or health care professional if you are unable to keep an appointment. What may interact with this medication? This medicine may interact with the following medications: Ibuprofen This list may not describe all possible interactions. Give your health care provider a list of all the medicines, herbs,  non-prescription drugs, or dietary supplements you use. Also tell them if you smoke, drink alcohol, or use illegal drugs. Some items may interact with your medicine. What should I watch for while using this medication? Visit your doctor for checks on your progress. This drug may make you feel generally unwell. This is not uncommon, as chemotherapy can affect healthy cells as well as cancer cells. Report any side effects. Continue your course of treatment even though you feel ill unless your doctor tells you to stop. In some cases, you may be given additional medicines to help with side effects. Follow all directions for their use. Call your doctor or health care professional for advice if you get a fever, chills or sore throat, or other symptoms of a cold or flu. Do not treat yourself. This drug decreases your body's ability to fight infections. Try to avoid being around people who are sick. This medicine may increase your risk to bruise or bleed. Call your doctor or health care professional if you notice any unusual bleeding. Be careful brushing and flossing your teeth or using a toothpick because you may get an infection or bleed more easily. If you have any dental  work done, tell your dentist you are receiving this medicine. Avoid taking products that contain aspirin, acetaminophen, ibuprofen, naproxen, or ketoprofen unless instructed by your doctor. These medicines may hide a fever. Call your doctor or health care professional if you get diarrhea or mouth sores. Do not treat yourself. To protect your kidneys, drink water or other fluids as directed while you are taking this medicine. Do not become pregnant while taking this medicine or for 6 months after stopping it. Women should inform their doctor if they wish to become pregnant or think they might be pregnant. Men should not father a child while taking this medicine and for 3 months after stopping it. This may interfere with the ability to father a  child. You should talk to your doctor or health care professional if you are concerned about your fertility. There is a potential for serious side effects to an unborn child. Talk to your health care professional or pharmacist for more information. Do not breast-feed an infant while taking this medicine or for 1 week after stopping it. What side effects may I notice from receiving this medication? Side effects that you should report to your doctor or health care professional as soon as possible: allergic reactions like skin rash, itching or hives, swelling of the face, lips, or tongue breathing problems redness, blistering, peeling or loosening of the skin, including inside the mouth signs and symptoms of bleeding such as bloody or black, tarry stools; red or dark-brown urine; spitting up blood or brown material that looks like coffee grounds; red spots on the skin; unusual bruising or bleeding from the eye, gums, or nose signs and symptoms of infection like fever or chills; cough; sore throat; pain or trouble passing urine signs and symptoms of kidney injury like trouble passing urine or change in the amount of urine signs and symptoms of liver injury like dark yellow or brown urine; general ill feeling or flu-like symptoms; light-colored stools; loss of appetite; nausea; right upper belly pain; unusually weak or tired; yellowing of the eyes or skin Side effects that usually do not require medical attention (report to your doctor or health care professional if they continue or are bothersome): constipation mouth sores nausea, vomiting unusually weak or tired This list may not describe all possible side effects. Call your doctor for medical advice about side effects. You may report side effects to FDA at 1-800-FDA-1088. Where should I keep my medication? This drug is given in a hospital or clinic and will not be stored at home. NOTE: This sheet is a summary. It may not cover all possible  information. If you have questions about this medicine, talk to your doctor, pharmacist, or health care provider.  2022 Elsevier/Gold Standard (2017-03-02 16:11:33)  Carboplatin injection What is this medication? CARBOPLATIN (KAR boe pla tin) is a chemotherapy drug. It targets fast dividing cells, like cancer cells, and causes these cells to die. This medicine is used to treat ovarian cancer and many other cancers. This medicine may be used for other purposes; ask your health care provider or pharmacist if you have questions. COMMON BRAND NAME(S): Paraplatin What should I tell my care team before I take this medication? They need to know if you have any of these conditions: blood disorders hearing problems kidney disease recent or ongoing radiation therapy an unusual or allergic reaction to carboplatin, cisplatin, other chemotherapy, other medicines, foods, dyes, or preservatives pregnant or trying to get pregnant breast-feeding How should I use this medication? This drug  is usually given as an infusion into a vein. It is administered in a hospital or clinic by a specially trained health care professional. Talk to your pediatrician regarding the use of this medicine in children. Special care may be needed. Overdosage: If you think you have taken too much of this medicine contact a poison control center or emergency room at once. NOTE: This medicine is only for you. Do not share this medicine with others. What if I miss a dose? It is important not to miss a dose. Call your doctor or health care professional if you are unable to keep an appointment. What may interact with this medication? medicines for seizures medicines to increase blood counts like filgrastim, pegfilgrastim, sargramostim some antibiotics like amikacin, gentamicin, neomycin, streptomycin, tobramycin vaccines Talk to your doctor or health care professional before taking any of these  medicines: acetaminophen aspirin ibuprofen ketoprofen naproxen This list may not describe all possible interactions. Give your health care provider a list of all the medicines, herbs, non-prescription drugs, or dietary supplements you use. Also tell them if you smoke, drink alcohol, or use illegal drugs. Some items may interact with your medicine. What should I watch for while using this medication? Your condition will be monitored carefully while you are receiving this medicine. You will need important blood work done while you are taking this medicine. This drug may make you feel generally unwell. This is not uncommon, as chemotherapy can affect healthy cells as well as cancer cells. Report any side effects. Continue your course of treatment even though you feel ill unless your doctor tells you to stop. In some cases, you may be given additional medicines to help with side effects. Follow all directions for their use. Call your doctor or health care professional for advice if you get a fever, chills or sore throat, or other symptoms of a cold or flu. Do not treat yourself. This drug decreases your body's ability to fight infections. Try to avoid being around people who are sick. This medicine may increase your risk to bruise or bleed. Call your doctor or health care professional if you notice any unusual bleeding. Be careful brushing and flossing your teeth or using a toothpick because you may get an infection or bleed more easily. If you have any dental work done, tell your dentist you are receiving this medicine. Avoid taking products that contain aspirin, acetaminophen, ibuprofen, naproxen, or ketoprofen unless instructed by your doctor. These medicines may hide a fever. Do not become pregnant while taking this medicine. Women should inform their doctor if they wish to become pregnant or think they might be pregnant. There is a potential for serious side effects to an unborn child. Talk to your  health care professional or pharmacist for more information. Do not breast-feed an infant while taking this medicine. What side effects may I notice from receiving this medication? Side effects that you should report to your doctor or health care professional as soon as possible: allergic reactions like skin rash, itching or hives, swelling of the face, lips, or tongue signs of infection - fever or chills, cough, sore throat, pain or difficulty passing urine signs of decreased platelets or bleeding - bruising, pinpoint red spots on the skin, black, tarry stools, nosebleeds signs of decreased red blood cells - unusually weak or tired, fainting spells, lightheadedness breathing problems changes in hearing changes in vision chest pain high blood pressure low blood counts - This drug may decrease the number of white blood cells,  red blood cells and platelets. You may be at increased risk for infections and bleeding. nausea and vomiting pain, swelling, redness or irritation at the injection site pain, tingling, numbness in the hands or feet problems with balance, talking, walking trouble passing urine or change in the amount of urine Side effects that usually do not require medical attention (report to your doctor or health care professional if they continue or are bothersome): hair loss loss of appetite metallic taste in the mouth or changes in taste This list may not describe all possible side effects. Call your doctor for medical advice about side effects. You may report side effects to FDA at 1-800-FDA-1088. Where should I keep my medication? This drug is given in a hospital or clinic and will not be stored at home. NOTE: This sheet is a summary. It may not cover all possible information. If you have questions about this medicine, talk to your doctor, pharmacist, or health care provider.  2022 Elsevier/Gold Standard (2007-04-18 14:38:05)

## 2020-12-01 ENCOUNTER — Telehealth: Payer: Self-pay | Admitting: Medical Oncology

## 2020-12-01 NOTE — Telephone Encounter (Addendum)
-"  Chills, just don't feel good". Last Thursday he received Alimta,carbo ,Keytruda .  Denies fever , pain any other symptoms. He is just laying around,. He is drinking fluids , water and Gatorade and protein drinks. F/u appt Thursday.   Per Dr  Julien Nordmann , [facility] appt.  Schedule message sent.

## 2020-12-02 ENCOUNTER — Inpatient Hospital Stay: Payer: Medicare Other

## 2020-12-02 ENCOUNTER — Other Ambulatory Visit: Payer: Self-pay

## 2020-12-02 ENCOUNTER — Inpatient Hospital Stay (HOSPITAL_BASED_OUTPATIENT_CLINIC_OR_DEPARTMENT_OTHER): Payer: Medicare Other

## 2020-12-02 ENCOUNTER — Inpatient Hospital Stay (HOSPITAL_BASED_OUTPATIENT_CLINIC_OR_DEPARTMENT_OTHER): Payer: Medicare Other | Admitting: Physician Assistant

## 2020-12-02 VITALS — BP 126/77 | HR 63 | Temp 97.5°F | Resp 17 | Ht 70.0 in | Wt 175.4 lb

## 2020-12-02 DIAGNOSIS — R6883 Chills (without fever): Secondary | ICD-10-CM

## 2020-12-02 DIAGNOSIS — C7972 Secondary malignant neoplasm of left adrenal gland: Secondary | ICD-10-CM | POA: Diagnosis not present

## 2020-12-02 DIAGNOSIS — C3412 Malignant neoplasm of upper lobe, left bronchus or lung: Secondary | ICD-10-CM

## 2020-12-02 DIAGNOSIS — R5382 Chronic fatigue, unspecified: Secondary | ICD-10-CM

## 2020-12-02 DIAGNOSIS — Z5111 Encounter for antineoplastic chemotherapy: Secondary | ICD-10-CM | POA: Diagnosis not present

## 2020-12-02 DIAGNOSIS — C3490 Malignant neoplasm of unspecified part of unspecified bronchus or lung: Secondary | ICD-10-CM

## 2020-12-02 DIAGNOSIS — Z5112 Encounter for antineoplastic immunotherapy: Secondary | ICD-10-CM | POA: Diagnosis not present

## 2020-12-02 DIAGNOSIS — Z5189 Encounter for other specified aftercare: Secondary | ICD-10-CM | POA: Diagnosis not present

## 2020-12-02 DIAGNOSIS — C781 Secondary malignant neoplasm of mediastinum: Secondary | ICD-10-CM | POA: Diagnosis not present

## 2020-12-02 LAB — CBC WITH DIFFERENTIAL (CANCER CENTER ONLY)
Abs Immature Granulocytes: 0.03 10*3/uL (ref 0.00–0.07)
Basophils Absolute: 0 10*3/uL (ref 0.0–0.1)
Basophils Relative: 0 %
Eosinophils Absolute: 0.1 10*3/uL (ref 0.0–0.5)
Eosinophils Relative: 1 %
HCT: 47.8 % (ref 39.0–52.0)
Hemoglobin: 17.1 g/dL — ABNORMAL HIGH (ref 13.0–17.0)
Immature Granulocytes: 1 %
Lymphocytes Relative: 11 %
Lymphs Abs: 0.6 10*3/uL — ABNORMAL LOW (ref 0.7–4.0)
MCH: 32 pg (ref 26.0–34.0)
MCHC: 35.8 g/dL (ref 30.0–36.0)
MCV: 89.3 fL (ref 80.0–100.0)
Monocytes Absolute: 0.1 10*3/uL (ref 0.1–1.0)
Monocytes Relative: 1 %
Neutro Abs: 5 10*3/uL (ref 1.7–7.7)
Neutrophils Relative %: 86 %
Platelet Count: 153 10*3/uL (ref 150–400)
RBC: 5.35 MIL/uL (ref 4.22–5.81)
RDW: 12.6 % (ref 11.5–15.5)
Smear Review: NORMAL
WBC Count: 5.8 10*3/uL (ref 4.0–10.5)
nRBC: 0 % (ref 0.0–0.2)

## 2020-12-02 LAB — CMP (CANCER CENTER ONLY)
ALT: 20 U/L (ref 0–44)
AST: 21 U/L (ref 15–41)
Albumin: 4.2 g/dL (ref 3.5–5.0)
Alkaline Phosphatase: 102 U/L (ref 38–126)
Anion gap: 11 (ref 5–15)
BUN: 24 mg/dL — ABNORMAL HIGH (ref 8–23)
CO2: 24 mmol/L (ref 22–32)
Calcium: 9.1 mg/dL (ref 8.9–10.3)
Chloride: 96 mmol/L — ABNORMAL LOW (ref 98–111)
Creatinine: 1.28 mg/dL — ABNORMAL HIGH (ref 0.61–1.24)
GFR, Estimated: 57 mL/min — ABNORMAL LOW (ref >60)
Glucose, Bld: 116 mg/dL — ABNORMAL HIGH (ref 70–99)
Potassium: 4.4 mmol/L (ref 3.5–5.1)
Sodium: 131 mmol/L — ABNORMAL LOW (ref 135–145)
Total Bilirubin: 1.5 mg/dL — ABNORMAL HIGH (ref 0.3–1.2)
Total Protein: 7 g/dL (ref 6.5–8.1)

## 2020-12-02 LAB — TSH: TSH: 1.012 u[IU]/mL (ref 0.320–4.118)

## 2020-12-02 NOTE — Progress Notes (Signed)
Symptom Management Consult note [place]    Patient Care Team: [name], MD as PCP - General (Family Medicine) Evans Lance, MD as PCP - Electrophysiology (Cardiology)    Name of the patient: Joe Hill  [id number]  08/28/1940   Date of visit: 12/02/2020    Chief complaint/ Reason for visit- chills  Oncology History  Primary adenocarcinoma of upper lobe of left lung ([place])  11/05/2020 Initial Diagnosis   Primary adenocarcinoma of upper lobe of left lung ([place])   11/18/2020 Cancer Staging   Staging form: Lung, AJCC 8th Edition - Clinical: Stage IVA Laney Pastor, cN3, cM1b) - Signed by Curt Bears, MD on 11/18/2020    11/27/2020 -  Chemotherapy   Patient is on Treatment Plan : LUNG CARBOplatin / Pemetrexed / Pembrolizumab q21d Induction x 4 cycles / Maintenance Pemetrexed + Pembrolizumab       Current Therapy: Carboplatin, pemetrexed, pembrolizumab, first treatment 11/27/20.  Interval history- Joe Hill is an 80 yo male presenting to [facility] today with chief complaint of chills x 2 days.Patient states Sunday he felt as if he "could not get warm." He had his first chemo treatment x 5 days ago. He admits to feeling well after treatment. He has had good appetite, fluid intake drinking 4-6 water bottles/ day. He forget he had turned on his air conditioning because of the unexpected warm weather over the weekend. Once he turned off the [facility] he reports chills resolved. He slept well through the night and woke up feeling like his normal self today. He states he wanted to check his labs to make sure he is not dehydrated. He denies any sick contacts or known covid exposures. Denies fever, congestion, chest pain, shortness of breath, nausea, emesis, urinary symptoms, diarrhea, easy bruising or bleeding.    ROS  All other systems are reviewed and are negative for acute change except as noted in the HPI.    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   TOBACCO USE 01/30/2007     Past Surgical History:  Procedure Laterality Date   APPENDECTOMY     CATARACT EXTRACTION     PROSTATE SURGERY     prostatectomy   VIDEO BRONCHOSCOPY WITH ENDOBRONCHIAL NAVIGATION N/A 10/30/2020   Procedure: VIDEO BRONCHOSCOPY WITH ENDOBRONCHIAL NAVIGATION;  Surgeon: Melrose Nakayama, MD;  Location: [place];  Service: Thoracic;  Laterality: N/A;   VIDEO BRONCHOSCOPY WITH ENDOBRONCHIAL ULTRASOUND N/A 10/30/2020   Procedure: VIDEO BRONCHOSCOPY WITH ENDOBRONCHIAL ULTRASOUND;  Surgeon: Melrose Nakayama, MD;  Location: [facility] OR;  Service: Thoracic;  Laterality: N/A;    Social History   Socioeconomic History   Marital status: Single    Spouse name: Not on file   Number of children: Not on file   Years of education: Not on file   Highest education level: Not on file  Occupational History   Not on file  Tobacco Use   Smoking status: Every Day    Packs/day: 0.25    Types: Cigarettes   Smokeless tobacco: Never  Vaping Use   Vaping Use: Never used  Substance and Sexual Activity   Alcohol use: No  Alcohol/week: 0.0 standard drinks   Drug use: No   Sexual activity: Not on file  Other Topics Concern   Not on file  Social History Narrative   Divorced. 2 sons. 5 grandkids- 2 in college, 3 in hs in 2021.       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       HObbies: woodworking, taking care of yard, very active at work   Social Determinants of Radio broadcast assistant Strain: Not on file  Food Insecurity: Not on file  Transportation Needs: Not on file  Physical  Activity: Not on file  Stress: Not on file  Social Connections: Not on file  Intimate Partner Violence: Not on file    Family History  Problem Relation Age of Onset   Hyperlipidemia Mother    Cancer Father        lung, smoker   Cancer Sister        breast, smoker   Arthritis Maternal Aunt      Current Outpatient Medications:    apixaban (ELIQUIS) 5 MG TABS tablet, Take 1 tablet (5 mg total) by mouth 2 (two) times daily. Start Saturday night with the pm dose., Disp: 60 tablet, Rfl: 11   dextromethorphan-guaiFENesin (MUCINEX DM) 30-600 MG 12hr tablet, Take 1 tablet by mouth 2 (two) times daily as needed for cough. (Patient not taking: Reported on 11/18/2020), Disp: , Rfl:    diltiazem (CARDIZEM CD) 180 MG 24 hr capsule, Take 1 capsule (180 mg total) by mouth daily., Disp: 30 capsule, Rfl: 11   folic acid (FOLVITE) 1 MG tablet, Take 1 tablet (1 mg total) by mouth daily., Disp: 30 tablet, Rfl: 4   potassium chloride SA (KLOR-CON) 20 MEQ tablet, Take 1 tablet (20 mEq total) by mouth daily. Take 2 tablets (53meq) for 2 days then take 1 tablet (29meq) daily, Disp: 90 tablet, Rfl: 3   prochlorperazine (COMPAZINE) 10 MG tablet, Take 1 tablet (10 mg total) by mouth every 6 (six) hours as needed for nausea or vomiting., Disp: 30 tablet, Rfl: 0  PHYSICAL EXAM: ECOG FS:1 - Symptomatic but completely ambulatory    Vitals:   12/02/20 1307  BP: 126/77  Pulse: 63  Resp: 17  Temp: (!) 97.5 F (36.4 C)  TempSrc: Oral  SpO2: 99%  Weight: 175 lb 6.4 oz (79.6 kg)  Height: 5\' 10"  (1.778 m)   Physical Exam Vitals and nursing note reviewed.  Constitutional:      General: He is not in acute distress.    Appearance: He is not ill-appearing.  HENT:     Head: Normocephalic and atraumatic.     Right Ear: External ear normal.     Left Ear: External ear normal.     Nose: Nose normal.     Mouth/Throat:     Mouth: Mucous membranes are moist.     Pharynx: Oropharynx is clear.  Eyes:     General:  No scleral icterus.       Right eye: No discharge.        Left eye: No discharge.     Extraocular Movements: Extraocular movements intact.     Conjunctiva/sclera: Conjunctivae normal.     Pupils: Pupils are equal, round, and reactive to light.  Neck:     Vascular: No JVD.  Cardiovascular:     Rate and Rhythm: Normal rate and regular rhythm.     Pulses: Normal pulses.  Radial pulses are 2+ on the right side and 2+ on the left side.     Heart sounds: Normal heart sounds.  Pulmonary:     Comments: Lungs clear to auscultation in all fields. Symmetric chest rise. No wheezing, rales, or rhonchi. Abdominal:     Comments: Abdomen is soft, non-distended, and non-tender in all quadrants. No rigidity, no guarding. No peritoneal signs.  Musculoskeletal:        General: Normal range of motion.     Cervical back: Normal range of motion.     Right lower leg: No edema.     Left lower leg: No edema.  Skin:    General: Skin is warm and dry.     Capillary Refill: Capillary refill takes less than 2 seconds.  Neurological:     Mental Status: He is oriented to person, place, and time.     GCS: GCS eye subscore is 4. GCS verbal subscore is 5. GCS motor subscore is 6.     Comments: Fluent speech, no facial droop.  Psychiatric:        Behavior: Behavior normal.       LABORATORY DATA: I have reviewed the data as listed CBC Latest Ref Rng & Units 12/02/2020 11/27/2020 11/18/2020  WBC 4.0 - 10.5 K/uL 5.8 5.4 5.8  Hemoglobin 13.0 - 17.0 g/dL 17.1(H) 15.5 15.3  Hematocrit 39.0 - 52.0 % 47.8 43.5 43.1  Platelets 150 - 400 K/uL 153 184 199     CMP Latest Ref Rng & Units 12/02/2020 11/27/2020 11/18/2020  Glucose 70 - 99 mg/dL 116(H) 97 160(H)  BUN 8 - 23 mg/dL 24(H) 15 16  Creatinine 0.61 - 1.24 mg/dL 1.28(H) 1.21 1.18  Sodium 135 - 145 mmol/L 131(L) 135 139  Potassium 3.5 - 5.1 mmol/L 4.4 4.3 3.3(L)  Chloride 98 - 111 mmol/L 96(L) 103 102  CO2 22 - 32 mmol/L 24 22 27   Calcium 8.9 - 10.3  mg/dL 9.1 8.9 8.9  Total Protein 6.5 - 8.1 g/dL 7.0 6.8 6.7  Total Bilirubin 0.3 - 1.2 mg/dL 1.5(H) 1.2 1.5(H)  Alkaline Phos 38 - 126 U/L 102 107 101  AST 15 - 41 U/L 21 18 16   ALT 0 - 44 U/L 20 16 16        RADIOGRAPHIC STUDIES: I have personally reviewed the radiological images as listed and agreed with the findings in the report. No images are attached to the encounter. No results found.   ASSESSMENT & PLAN: Patient is a 80 y.o. male with recent diagnosis of stage IV non-small cell lung cancer, adenocarcinoma on carboplatin, pemetrexed and pembrolizumab followed by oncologist Dr. Julien Nordmann.  #)chills- Patient currently asymptomatic. VS stable. No infectious symptoms and benign physical exam. Labs checked today prior to arrival. TSH normal. CBC with hemoglobin 17.1, otherwise no acute findings. CMP shows slight bump kidney function BUN/Cr 24/1.28 and mild hyponatremia 131. No severe electrolyte derangement. Patient tolerating PO intake, he knows to push fluids at home and kidney function can be trended at next oncology appointment. No indications for further work up of chills as it was likely to having his AC on as symptoms resolved when it was turned off.    #) stage IV non-small cell lung cancer, adenocarcinoma - continue treatment per primary oncologist. Next appointment is in x 2 days.    Visit Diagnosis: 1. Chills without fever   2. Primary adenocarcinoma of upper lobe of left lung ([place])      No orders of the defined types  were placed in this encounter.   All questions were answered. The patient knows to call the clinic with any problems, questions or concerns. No barriers to learning was detected.  I have spent a total of 20 minutes minutes of face-to-face and non-face-to-face time, preparing to see the patient, obtaining and/or reviewing separately obtained history, performing a medically appropriate examination, counseling and educating the patient, ordering tests,   documenting clinical information in the electronic health record, and care coordination.     Thank you for allowing me to participate in the care of this patient.    Barrie Folk, PA-C Department of Hematology/Oncology [place] at [facility] Phone: [phone]  [phone]    12/02/2020 2:33 PM

## 2020-12-04 ENCOUNTER — Inpatient Hospital Stay: Payer: Medicare Other | Admitting: Internal Medicine

## 2020-12-04 ENCOUNTER — Inpatient Hospital Stay: Payer: Medicare Other

## 2020-12-11 ENCOUNTER — Inpatient Hospital Stay: Payer: Medicare Other

## 2020-12-11 ENCOUNTER — Telehealth: Payer: Self-pay | Admitting: *Deleted

## 2020-12-11 ENCOUNTER — Other Ambulatory Visit: Payer: Self-pay

## 2020-12-11 ENCOUNTER — Other Ambulatory Visit: Payer: Self-pay | Admitting: Internal Medicine

## 2020-12-11 DIAGNOSIS — C7972 Secondary malignant neoplasm of left adrenal gland: Secondary | ICD-10-CM | POA: Diagnosis not present

## 2020-12-11 DIAGNOSIS — C3412 Malignant neoplasm of upper lobe, left bronchus or lung: Secondary | ICD-10-CM | POA: Diagnosis not present

## 2020-12-11 DIAGNOSIS — Z5112 Encounter for antineoplastic immunotherapy: Secondary | ICD-10-CM | POA: Diagnosis not present

## 2020-12-11 DIAGNOSIS — C781 Secondary malignant neoplasm of mediastinum: Secondary | ICD-10-CM | POA: Diagnosis not present

## 2020-12-11 DIAGNOSIS — Z5189 Encounter for other specified aftercare: Secondary | ICD-10-CM | POA: Diagnosis not present

## 2020-12-11 DIAGNOSIS — Z5111 Encounter for antineoplastic chemotherapy: Secondary | ICD-10-CM | POA: Diagnosis not present

## 2020-12-11 LAB — CBC WITH DIFFERENTIAL (CANCER CENTER ONLY)
Abs Immature Granulocytes: 0.03 10*3/uL (ref 0.00–0.07)
Basophils Absolute: 0 10*3/uL (ref 0.0–0.1)
Basophils Relative: 1 %
Eosinophils Absolute: 0 10*3/uL (ref 0.0–0.5)
Eosinophils Relative: 1 %
HCT: 38.2 % — ABNORMAL LOW (ref 39.0–52.0)
Hemoglobin: 13.4 g/dL (ref 13.0–17.0)
Immature Granulocytes: 2 %
Lymphocytes Relative: 46 %
Lymphs Abs: 0.6 10*3/uL — ABNORMAL LOW (ref 0.7–4.0)
MCH: 31.8 pg (ref 26.0–34.0)
MCHC: 35.1 g/dL (ref 30.0–36.0)
MCV: 90.5 fL (ref 80.0–100.0)
Monocytes Absolute: 0.3 10*3/uL (ref 0.1–1.0)
Monocytes Relative: 25 %
Neutro Abs: 0.3 10*3/uL — CL (ref 1.7–7.7)
Neutrophils Relative %: 25 %
Platelet Count: 98 10*3/uL — ABNORMAL LOW (ref 150–400)
RBC: 4.22 MIL/uL (ref 4.22–5.81)
RDW: 12.3 % (ref 11.5–15.5)
WBC Count: 1.3 10*3/uL — ABNORMAL LOW (ref 4.0–10.5)
nRBC: 0 % (ref 0.0–0.2)

## 2020-12-11 LAB — CMP (CANCER CENTER ONLY)
ALT: 62 U/L — ABNORMAL HIGH (ref 0–44)
AST: 39 U/L (ref 15–41)
Albumin: 3.6 g/dL (ref 3.5–5.0)
Alkaline Phosphatase: 100 U/L (ref 38–126)
Anion gap: 9 (ref 5–15)
BUN: 22 mg/dL (ref 8–23)
CO2: 28 mmol/L (ref 22–32)
Calcium: 9 mg/dL (ref 8.9–10.3)
Chloride: 100 mmol/L (ref 98–111)
Creatinine: 1.1 mg/dL (ref 0.61–1.24)
GFR, Estimated: >60 mL/min (ref >60)
Glucose, Bld: 97 mg/dL (ref 70–99)
Potassium: 4 mmol/L (ref 3.5–5.1)
Sodium: 137 mmol/L (ref 135–145)
Total Bilirubin: 0.7 mg/dL (ref 0.3–1.2)
Total Protein: 6.7 g/dL (ref 6.5–8.1)

## 2020-12-11 NOTE — Telephone Encounter (Signed)
Notified that WBC and ANC are very low. Reviewed neutropenic precautions. Message to scheduler to arrange Granix injections tomorrow and Saturday.

## 2020-12-12 ENCOUNTER — Inpatient Hospital Stay: Payer: Medicare Other

## 2020-12-12 VITALS — BP 97/65 | HR 82 | Resp 16

## 2020-12-12 DIAGNOSIS — C3412 Malignant neoplasm of upper lobe, left bronchus or lung: Secondary | ICD-10-CM | POA: Diagnosis not present

## 2020-12-12 DIAGNOSIS — C781 Secondary malignant neoplasm of mediastinum: Secondary | ICD-10-CM | POA: Diagnosis not present

## 2020-12-12 DIAGNOSIS — Z5111 Encounter for antineoplastic chemotherapy: Secondary | ICD-10-CM | POA: Diagnosis not present

## 2020-12-12 DIAGNOSIS — C7972 Secondary malignant neoplasm of left adrenal gland: Secondary | ICD-10-CM | POA: Diagnosis not present

## 2020-12-12 DIAGNOSIS — Z5112 Encounter for antineoplastic immunotherapy: Secondary | ICD-10-CM | POA: Diagnosis not present

## 2020-12-12 DIAGNOSIS — D702 Other drug-induced agranulocytosis: Secondary | ICD-10-CM

## 2020-12-12 DIAGNOSIS — Z5189 Encounter for other specified aftercare: Secondary | ICD-10-CM | POA: Diagnosis not present

## 2020-12-12 MED ORDER — FILGRASTIM-AAFI 480 MCG/0.8ML IJ SOSY: 480.0000 ug | PREFILLED_SYRINGE | Freq: Once | INTRAMUSCULAR | Status: DC

## 2020-12-12 MED ORDER — FILGRASTIM-AAFI 480 MCG/0.8ML IJ SOSY
480.0000 ug | PREFILLED_SYRINGE | Freq: Once | INTRAMUSCULAR | Status: AC
  Administered 2020-12-12: 480 ug via SUBCUTANEOUS

## 2020-12-12 NOTE — Patient Instructions (Signed)
Filgrastim, G-CSF injection What is this medication? FILGRASTIM, G-CSF (fil GRA stim) is a granulocyte colony-stimulating factor that stimulates the growth of neutrophils, a type of white blood cell (WBC) important in the body's fight against infection. It is used to reduce the incidence of fever and infection in patients with certain types of cancer who are receiving chemotherapy that affects the bone marrow, to stimulate blood cell production for removal of WBCs from the body prior to a bone marrow transplantation, to reduce the incidence of fever and infection in patients who have severe chronic neutropenia, and to improve survival outcomes following high-dose radiation exposure that is toxic to the bone marrow. This medicine may be used for other purposes; ask your health care provider or pharmacist if you have questions. COMMON BRAND NAME(S): Neupogen, Nivestym, Releuko, Zarxio What should I tell my care team before I take this medication? They need to know if you have any of these conditions: kidney disease latex allergy ongoing radiation therapy sickle cell disease an unusual or allergic reaction to filgrastim, pegfilgrastim, other medicines, foods, dyes, or preservatives pregnant or trying to get pregnant breast-feeding How should I use this medication? This medicine is for injection under the skin or infusion into a vein. As an infusion into a vein, it is usually given by a health care professional in a hospital or clinic setting. If you get this medicine at home, you will be taught how to prepare and give this medicine. Refer to the Instructions for Use that come with your medication packaging. Use exactly as directed. Take your medicine at regular intervals. Do not take your medicine more often than directed. It is important that you put your used needles and syringes in a special sharps container. Do not put them in a trash can. If you do not have a sharps container, call your pharmacist  or healthcare provider to get one. Talk to your pediatrician regarding the use of this medicine in children. While this drug may be prescribed for children as young as 7 months for selected conditions, precautions do apply. Overdosage: If you think you have taken too much of this medicine contact a poison control center or emergency room at once. NOTE: This medicine is only for you. Do not share this medicine with others. What if I miss a dose? It is important not to miss your dose. Call your doctor or health care professional if you miss a dose. What may interact with this medication? This medicine may interact with the following medications: medicines that may cause a release of neutrophils, such as lithium This list may not describe all possible interactions. Give your health care provider a list of all the medicines, herbs, non-prescription drugs, or dietary supplements you use. Also tell them if you smoke, drink alcohol, or use illegal drugs. Some items may interact with your medicine. What should I watch for while using this medication? Your condition will be monitored carefully while you are receiving this medicine. You may need blood work done while you are taking this medicine. Talk to your health care provider about your risk of cancer. You may be more at risk for certain types of cancer if you take this medicine. What side effects may I notice from receiving this medication? Side effects that you should report to your doctor or health care professional as soon as possible: allergic reactions like skin rash, itching or hives, swelling of the face, lips, or tongue back pain dizziness or feeling faint fever pain, redness, or  irritation at site where injected pinpoint red spots on the skin shortness of breath or breathing problems signs and symptoms of kidney injury like trouble passing urine, change in the amount of urine, or red or dark-brown urine stomach or side pain, or pain at  the shoulder swelling tiredness unusual bleeding or bruising Side effects that usually do not require medical attention (report to your doctor or health care professional if they continue or are bothersome): bone pain cough diarrhea hair loss headache muscle pain This list may not describe all possible side effects. Call your doctor for medical advice about side effects. You may report side effects to FDA at 1-800-FDA-1088. Where should I keep my medication? Keep out of the reach of children. Store in a refrigerator between 2 and 8 degrees C (36 and 46 degrees F). Do not freeze. Keep in carton to protect from light. Throw away this medicine if vials or syringes are left out of the refrigerator for more than 24 hours. Throw away any unused medicine after the expiration date. NOTE: This sheet is a summary. It may not cover all possible information. If you have questions about this medicine, talk to your doctor, pharmacist, or health care provider.  2022 Elsevier/Gold Standard (2020-09-30 00:00:00)

## 2020-12-13 ENCOUNTER — Inpatient Hospital Stay: Payer: Medicare Other

## 2020-12-13 ENCOUNTER — Other Ambulatory Visit: Payer: Self-pay

## 2020-12-13 VITALS — BP 116/72 | HR 102 | Temp 95.0°F | Resp 16

## 2020-12-13 DIAGNOSIS — C3412 Malignant neoplasm of upper lobe, left bronchus or lung: Secondary | ICD-10-CM | POA: Diagnosis not present

## 2020-12-13 DIAGNOSIS — Z5111 Encounter for antineoplastic chemotherapy: Secondary | ICD-10-CM | POA: Diagnosis not present

## 2020-12-13 DIAGNOSIS — Z5112 Encounter for antineoplastic immunotherapy: Secondary | ICD-10-CM | POA: Diagnosis not present

## 2020-12-13 DIAGNOSIS — D702 Other drug-induced agranulocytosis: Secondary | ICD-10-CM

## 2020-12-13 DIAGNOSIS — Z5189 Encounter for other specified aftercare: Secondary | ICD-10-CM | POA: Diagnosis not present

## 2020-12-13 DIAGNOSIS — C7972 Secondary malignant neoplasm of left adrenal gland: Secondary | ICD-10-CM | POA: Diagnosis not present

## 2020-12-13 DIAGNOSIS — C781 Secondary malignant neoplasm of mediastinum: Secondary | ICD-10-CM | POA: Diagnosis not present

## 2020-12-13 MED ORDER — FILGRASTIM-AAFI 480 MCG/0.8ML IJ SOSY
480.0000 ug | PREFILLED_SYRINGE | Freq: Once | INTRAMUSCULAR | Status: AC
  Administered 2020-12-13: 480 ug via SUBCUTANEOUS

## 2020-12-13 MED ORDER — FILGRASTIM-AAFI 480 MCG/0.8ML IJ SOSY
480.0000 ug | PREFILLED_SYRINGE | Freq: Once | INTRAMUSCULAR | Status: DC
  Filled 2020-12-13: qty 0.8

## 2020-12-13 NOTE — Patient Instructions (Signed)
Filgrastim, G-CSF injection What is this medication? FILGRASTIM, G-CSF (fil GRA stim) is a granulocyte colony-stimulating factor that stimulates the growth of neutrophils, a type of white blood cell (WBC) important in the body's fight against infection. It is used to reduce the incidence of fever and infection in patients with certain types of cancer who are receiving chemotherapy that affects the bone marrow, to stimulate blood cell production for removal of WBCs from the body prior to a bone marrow transplantation, to reduce the incidence of fever and infection in patients who have severe chronic neutropenia, and to improve survival outcomes following high-dose radiation exposure that is toxic to the bone marrow. This medicine may be used for other purposes; ask your health care provider or pharmacist if you have questions. COMMON BRAND NAME(S): Neupogen, Nivestym, Releuko, Zarxio What should I tell my care team before I take this medication? They need to know if you have any of these conditions: kidney disease latex allergy ongoing radiation therapy sickle cell disease an unusual or allergic reaction to filgrastim, pegfilgrastim, other medicines, foods, dyes, or preservatives pregnant or trying to get pregnant breast-feeding How should I use this medication? This medicine is for injection under the skin or infusion into a vein. As an infusion into a vein, it is usually given by a health care professional in a hospital or clinic setting. If you get this medicine at home, you will be taught how to prepare and give this medicine. Refer to the Instructions for Use that come with your medication packaging. Use exactly as directed. Take your medicine at regular intervals. Do not take your medicine more often than directed. It is important that you put your used needles and syringes in a special sharps container. Do not put them in a trash can. If you do not have a sharps container, call your pharmacist  or healthcare provider to get one. Talk to your pediatrician regarding the use of this medicine in children. While this drug may be prescribed for children as young as 7 months for selected conditions, precautions do apply. Overdosage: If you think you have taken too much of this medicine contact a poison control center or emergency room at once. NOTE: This medicine is only for you. Do not share this medicine with others. What if I miss a dose? It is important not to miss your dose. Call your doctor or health care professional if you miss a dose. What may interact with this medication? This medicine may interact with the following medications: medicines that may cause a release of neutrophils, such as lithium This list may not describe all possible interactions. Give your health care provider a list of all the medicines, herbs, non-prescription drugs, or dietary supplements you use. Also tell them if you smoke, drink alcohol, or use illegal drugs. Some items may interact with your medicine. What should I watch for while using this medication? Your condition will be monitored carefully while you are receiving this medicine. You may need blood work done while you are taking this medicine. Talk to your health care provider about your risk of cancer. You may be more at risk for certain types of cancer if you take this medicine. What side effects may I notice from receiving this medication? Side effects that you should report to your doctor or health care professional as soon as possible: allergic reactions like skin rash, itching or hives, swelling of the face, lips, or tongue back pain dizziness or feeling faint fever pain, redness, or  irritation at site where injected pinpoint red spots on the skin shortness of breath or breathing problems signs and symptoms of kidney injury like trouble passing urine, change in the amount of urine, or red or dark-brown urine stomach or side pain, or pain at  the shoulder swelling tiredness unusual bleeding or bruising Side effects that usually do not require medical attention (report to your doctor or health care professional if they continue or are bothersome): bone pain cough diarrhea hair loss headache muscle pain This list may not describe all possible side effects. Call your doctor for medical advice about side effects. You may report side effects to FDA at 1-800-FDA-1088. Where should I keep my medication? Keep out of the reach of children. Store in a refrigerator between 2 and 8 degrees C (36 and 46 degrees F). Do not freeze. Keep in carton to protect from light. Throw away this medicine if vials or syringes are left out of the refrigerator for more than 24 hours. Throw away any unused medicine after the expiration date. NOTE: This sheet is a summary. It may not cover all possible information. If you have questions about this medicine, talk to your doctor, pharmacist, or health care provider.  2022 Elsevier/Gold Standard (2020-09-30 00:00:00)

## 2020-12-15 ENCOUNTER — Other Ambulatory Visit: Payer: Self-pay

## 2020-12-15 ENCOUNTER — Emergency Department (HOSPITAL_COMMUNITY)
Admission: EM | Admit: 2020-12-15 | Discharge: 2020-12-15 | Disposition: A | Discharge status: Home / Self Care | Payer: Medicare Other | Attending: Emergency Medicine | Admitting: Emergency Medicine

## 2020-12-15 ENCOUNTER — Emergency Department (HOSPITAL_COMMUNITY): Payer: Medicare Other

## 2020-12-15 ENCOUNTER — Encounter (HOSPITAL_COMMUNITY): Payer: Self-pay

## 2020-12-15 DIAGNOSIS — I10 Essential (primary) hypertension: Secondary | ICD-10-CM | POA: Insufficient documentation

## 2020-12-15 DIAGNOSIS — J189 Pneumonia, unspecified organism: Secondary | ICD-10-CM | POA: Diagnosis not present

## 2020-12-15 DIAGNOSIS — Z7901 Long term (current) use of anticoagulants: Secondary | ICD-10-CM | POA: Insufficient documentation

## 2020-12-15 DIAGNOSIS — F17210 Nicotine dependence, cigarettes, uncomplicated: Secondary | ICD-10-CM | POA: Insufficient documentation

## 2020-12-15 DIAGNOSIS — Z79899 Other long term (current) drug therapy: Secondary | ICD-10-CM | POA: Diagnosis not present

## 2020-12-15 DIAGNOSIS — J449 Chronic obstructive pulmonary disease, unspecified: Secondary | ICD-10-CM | POA: Diagnosis not present

## 2020-12-15 DIAGNOSIS — Z20822 Contact with and (suspected) exposure to covid-19: Secondary | ICD-10-CM | POA: Insufficient documentation

## 2020-12-15 DIAGNOSIS — R918 Other nonspecific abnormal finding of lung field: Secondary | ICD-10-CM | POA: Diagnosis not present

## 2020-12-15 DIAGNOSIS — J9811 Atelectasis: Secondary | ICD-10-CM | POA: Diagnosis not present

## 2020-12-15 DIAGNOSIS — R059 Cough, unspecified: Secondary | ICD-10-CM | POA: Diagnosis not present

## 2020-12-15 DIAGNOSIS — R0602 Shortness of breath: Secondary | ICD-10-CM | POA: Diagnosis not present

## 2020-12-15 LAB — CBC WITH DIFFERENTIAL/PLATELET
Abs Immature Granulocytes: 0.48 10*3/uL — ABNORMAL HIGH (ref 0.00–0.07)
Basophils Absolute: 0 10*3/uL (ref 0.0–0.1)
Basophils Relative: 0 %
Eosinophils Absolute: 0 10*3/uL (ref 0.0–0.5)
Eosinophils Relative: 0 %
HCT: 38.8 % — ABNORMAL LOW (ref 39.0–52.0)
Hemoglobin: 13.2 g/dL (ref 13.0–17.0)
Immature Granulocytes: 5 %
Lymphocytes Relative: 14 %
Lymphs Abs: 1.3 10*3/uL (ref 0.7–4.0)
MCH: 32 pg (ref 26.0–34.0)
MCHC: 34 g/dL (ref 30.0–36.0)
MCV: 93.9 fL (ref 80.0–100.0)
Monocytes Absolute: 1 10*3/uL (ref 0.1–1.0)
Monocytes Relative: 10 %
Neutro Abs: 6.5 10*3/uL (ref 1.7–7.7)
Neutrophils Relative %: 71 %
Platelets: 369 10*3/uL (ref 150–400)
RBC: 4.13 MIL/uL — ABNORMAL LOW (ref 4.22–5.81)
RDW: 13.6 % (ref 11.5–15.5)
WBC: 9.2 10*3/uL (ref 4.0–10.5)
nRBC: 0.2 % (ref 0.0–0.2)

## 2020-12-15 LAB — BASIC METABOLIC PANEL
Anion gap: 7 (ref 5–15)
BUN: 17 mg/dL (ref 8–23)
CO2: 28 mmol/L (ref 22–32)
Calcium: 9.2 mg/dL (ref 8.9–10.3)
Chloride: 101 mmol/L (ref 98–111)
Creatinine, Ser: 1.22 mg/dL (ref 0.61–1.24)
GFR, Estimated: 60 mL/min — ABNORMAL LOW (ref >60)
Glucose, Bld: 115 mg/dL — ABNORMAL HIGH (ref 70–99)
Potassium: 4.2 mmol/L (ref 3.5–5.1)
Sodium: 136 mmol/L (ref 135–145)

## 2020-12-15 LAB — RESP PANEL BY RT-PCR (FLU A&B, COVID) ARPGX2
Influenza A by PCR: NEGATIVE
Influenza B by PCR: NEGATIVE
SARS Coronavirus 2 by RT PCR: NEGATIVE

## 2020-12-15 MED ORDER — SODIUM CHLORIDE 0.9 % IV BOLUS
500.0000 mL | Freq: Once | INTRAVENOUS | Status: AC
  Administered 2020-12-15: 500 mL via INTRAVENOUS

## 2020-12-15 MED ORDER — DOXYCYCLINE HYCLATE 100 MG PO TABS
100.0000 mg | ORAL_TABLET | Freq: Once | ORAL | Status: AC
  Administered 2020-12-15: 100 mg via ORAL
  Filled 2020-12-15: qty 1

## 2020-12-15 MED ORDER — DOXYCYCLINE HYCLATE 100 MG PO CAPS: 100.0000 mg | ORAL_CAPSULE | Freq: Two times a day (BID) | ORAL | 0 refills | Status: DC

## 2020-12-15 NOTE — Discharge Instructions (Signed)
Follow-up with your oncologist within the next couple of days.  Return here as needed if you have any worsening symptoms.

## 2020-12-15 NOTE — ED Triage Notes (Signed)
Patient c/o weakness, a productive cough "with clear/green sputum." Patient states his last chemo was 11/27/20.  Patient denies any fever or chills.

## 2020-12-15 NOTE — ED Provider Notes (Signed)
Joe Hill   CSN: [id number] Arrival date & time: 12/15/20  1340     History Chief Complaint  Patient presents with   chemo patient   Shortness of Breath   Cough   Weakness    Joe Hill is a 79 y.o. male.  Patient is a 80 year old male who presents with a cough.  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  He is currently undergoing chemotherapy with his most recent chemo on November 3.  He presents with a cough which he says is been going on for about the last 3 weeks.  He notices some increased phlegm in his chest.  He is having a hard time coughing it out.  He has been using Mucinex DM without improvement in symptoms.  He denies any fevers.  No increase in his shortness of breath.  No leg swelling.  No change in color to his sputum.  He says its pale green.  No associated chest pain.  He uses his albuterol inhaler about every 6 hours which is unchanged from his baseline.  He denies any increased wheezing.  He is mostly concerned about the phlegm and his ability to cough it up.      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   TOBACCO USE 01/30/2007    Patient Active Problem List   Diagnosis Date Noted   Encounter for antineoplastic chemotherapy 11/18/2020   Encounter for antineoplastic immunotherapy 11/18/2020   Primary adenocarcinoma of upper lobe of left lung ([place]) 11/05/2020   Aortic atherosclerosis ([place]) 10/28/2020   Dyslipidemia 10/23/2016   Abnormal CT scan, chest 09/07/2010   EMPHYSEMA 10/16/2009   GERD 09/09/2009   Hyperglycemia 08/12/2009   Essential  hypertension 08/04/2007   TOBACCO USE 01/30/2007   PROSTATE CANCER, 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/11/2006    Past Surgical History:  Procedure Laterality Date   APPENDECTOMY     CATARACT EXTRACTION     PROSTATE SURGERY     prostatectomy   VIDEO BRONCHOSCOPY WITH ENDOBRONCHIAL NAVIGATION N/A 10/30/2020   Procedure: VIDEO BRONCHOSCOPY WITH ENDOBRONCHIAL NAVIGATION;  Surgeon: Melrose Nakayama, MD;  Location: [facility];  Service: Thoracic;  Laterality: N/A;   VIDEO BRONCHOSCOPY WITH ENDOBRONCHIAL ULTRASOUND N/A 10/30/2020   Procedure: VIDEO BRONCHOSCOPY WITH ENDOBRONCHIAL ULTRASOUND;  Surgeon: Melrose Nakayama, MD;  Location: [facility];  Service: Thoracic;  Laterality: N/A;       Family History  Problem Relation Age of Onset   Hyperlipidemia Mother    Cancer Father        lung, smoker   Cancer Sister        breast, smoker   Arthritis Maternal Aunt     Social History   Tobacco Use   Smoking status: Every Day    Packs/day: 0.25    Types: Cigarettes   Smokeless tobacco: Never  Vaping Use   Vaping Use: Never used  Substance Use Topics   Alcohol use: No    Alcohol/week: 0.0 standard drinks   Drug use: No    Home Medications Prior to Admission medications   Medication Sig Start Date End Date  Taking? Authorizing Provider  doxycycline (VIBRAMYCIN) 100 MG capsule Take 1 capsule (100 mg total) by mouth 2 (two) times daily. One po bid x 7 days 12/15/20  Yes Malvin Johns, MD  apixaban (ELIQUIS) 5 MG TABS tablet Take 1 tablet (5 mg total) by mouth 2 (two) times daily. Start Saturday night with the pm dose. 11/01/20   Barrett, Evelene Croon, PA-C  dextromethorphan-guaiFENesin (MUCINEX DM) 30-600 MG 12hr tablet Take 1 tablet by mouth 2 (two) times daily as needed for cough. Patient not taking: Reported on 11/18/2020    [provider]  diltiazem (CARDIZEM CD) 180 MG 24 hr capsule Take 1 capsule (180 mg total) by mouth daily. 10/30/20 10/30/21  Barrett, Evelene Croon,  PA-C  folic acid (FOLVITE) 1 MG tablet Take 1 tablet (1 mg total) by mouth daily. 11/18/20   Curt Bears, MD  potassium chloride SA (KLOR-CON) 20 MEQ tablet Take 1 tablet (20 mEq total) by mouth daily. Take 2 tablets (7meq) for 2 days then take 1 tablet (13meq) daily 11/17/20   Shirley Friar, PA-C  prochlorperazine (COMPAZINE) 10 MG tablet Take 1 tablet (10 mg total) by mouth every 6 (six) hours as needed for nausea or vomiting. 11/18/20   Curt Bears, MD    Allergies    Penicillins  Review of Systems   Review of Systems  Constitutional:  Negative for chills, diaphoresis, fatigue and fever.  HENT:  Positive for congestion and rhinorrhea. Negative for sneezing.   Eyes: Negative.   Respiratory:  Positive for cough and wheezing. Negative for chest tightness and shortness of breath.   Cardiovascular:  Negative for chest pain and leg swelling.  Gastrointestinal:  Negative for abdominal pain, blood in stool, diarrhea, nausea and vomiting.  Genitourinary:  Negative for difficulty urinating, flank pain, frequency and hematuria.  Musculoskeletal:  Negative for arthralgias and back pain.  Skin:  Negative for rash.  Neurological:  Negative for dizziness, speech difficulty, weakness, numbness and headaches.   Physical Exam Updated Vital Signs BP 130/81   Pulse (!) 108   Temp 97.9 F (36.6 C) (Oral)   Resp 19   Ht 5\' 10"  (1.778 m)   Wt 79.4 kg   SpO2 98%   BMI 25.11 kg/m   Physical Exam Constitutional:      Appearance: He is well-developed.  HENT:     Head: Normocephalic and atraumatic.  Eyes:     Pupils: Pupils are equal, round, and reactive to light.  Cardiovascular:     Rate and Rhythm: Normal rate and regular rhythm.     Heart sounds: Normal heart sounds.  Pulmonary:     Effort: Pulmonary effort is normal. No respiratory distress.     Breath sounds: Normal breath sounds. No wheezing or rales.     Comments: Mildly decreased breath sounds bilaterally, mild  end expiratory wheezing bilaterally, no increased work of breathing, no tachypnea Chest:     Chest wall: No tenderness.  Abdominal:     General: Bowel sounds are normal.     Palpations: Abdomen is soft.     Tenderness: There is no abdominal tenderness. There is no guarding or rebound.  Musculoskeletal:        General: Normal range of motion.     Cervical back: Normal range of motion and neck supple.     Comments: No edema or calf tenderness  Lymphadenopathy:     Cervical: No cervical adenopathy.  Skin:    General: Skin is warm and dry.  Findings: No rash.  Neurological:     Mental Status: He is alert and oriented to person, place, and time.    ED Results / Procedures / Treatments   Labs (all labs ordered are listed, but only abnormal results are displayed) Labs Reviewed  BASIC METABOLIC PANEL - Abnormal; Notable for the following components:      Result Value   Glucose, Bld 115 (*)    GFR, Estimated 60 (*)    All other components within normal limits  CBC WITH DIFFERENTIAL/PLATELET - Abnormal; Notable for the following components:   RBC 4.13 (*)    HCT 38.8 (*)    All other components within normal limits  RESP PANEL BY RT-PCR (FLU A&B, COVID) [id number]    EKG EKG Interpretation  Date/Time:  Monday December 15 2020 15:50:02 EST Ventricular Rate:  108 PR Interval:    QRS Duration: 92 QT Interval:  349 QTC Calculation: 468 R Axis:   10 Text Interpretation: Atrial fibrillation Ventricular premature complex Borderline low voltage, extremity leads Abnormal R-wave progression, early transition Nonspecific T abnormalities, inferior leads since last tracing no significant change Confirmed by Malvin Johns [phone]) on 12/15/2020 3:54:43 PM  Radiology DG Chest 2 View  Result Date: 12/15/2020 CLINICAL DATA:  Cough and congestion EXAM: CHEST - 2 VIEW COMPARISON:  Chest x-ray dated October 28, 2020 FINDINGS: Cardiac and mediastinal contours within normal limits. Unchanged left  upper lobe mass. New mild bibasilar opacities. No pleural effusion or pneumothorax. Inferior right costophrenic angle is excluded from the field of view. IMPRESSION: 1. New mild bibasilar opacities differential includes atelectasis, aspiration or infection. 2. Left upper lobe mass, similar to prior radiograph. Electronically Signed   By: Yetta Glassman M.D.   On: 12/15/2020 15:50    Procedures Procedures   Medications Ordered in ED Medications  sodium chloride 0.9 % bolus 500 mL (0 mLs Intravenous Stopped 12/15/20 1712)  doxycycline (VIBRA-TABS) tablet 100 mg (100 mg Oral Given 12/15/20 1841)    ED Course  I have reviewed the triage vital signs and the nursing notes.  Pertinent labs & imaging results that were available during my care of the patient were reviewed by me and considered in my medical decision making (see chart for details).    MDM Rules/Calculators/A&P                           Patient is a 81 year old male who presents with a cough.  He does not report any shortness of breath.  No increased wheezing.  No fevers.  No associated chest pain.  His x-ray shows possible bibasilar infiltrates versus atelectasis.  Given his increased sputum with history of COPD, will start him on doxycycline.  He does not have any respiratory distress.  No hypoxia.  No increased work of breathing.  His flu/COVID were negative.  His labs are nonconcerning.  He was discharged home in good condition.  His heart rate is a little bit elevated.  He is in atrial fibrillation.  He is on Cardizem and Eliquis.  He says normally it goes up and down.  He does not want to stay for any further treatment of this.  He says that he will keep an eye on it at home and follow-up with his doctor.  Return precautions were given. Final Clinical Impression(s) / ED Diagnoses Final diagnoses:  Community acquired pneumonia, unspecified laterality    Rx / DC Orders ED Discharge Orders  Ordered    doxycycline  (VIBRAMYCIN) 100 MG capsule  2 times daily        12/15/20 1843             Malvin Johns, MD 12/15/20 1845

## 2020-12-17 ENCOUNTER — Inpatient Hospital Stay: Payer: Medicare Other

## 2020-12-17 ENCOUNTER — Inpatient Hospital Stay (HOSPITAL_BASED_OUTPATIENT_CLINIC_OR_DEPARTMENT_OTHER): Payer: Medicare Other | Admitting: Internal Medicine

## 2020-12-17 ENCOUNTER — Other Ambulatory Visit: Payer: Self-pay

## 2020-12-17 ENCOUNTER — Encounter: Payer: Self-pay | Admitting: Internal Medicine

## 2020-12-17 ENCOUNTER — Encounter: Payer: Self-pay | Admitting: *Deleted

## 2020-12-17 VITALS — BP 105/64 | HR 93 | Temp 98.5°F | Resp 19 | Ht 70.0 in | Wt 173.5 lb

## 2020-12-17 DIAGNOSIS — C3412 Malignant neoplasm of upper lobe, left bronchus or lung: Secondary | ICD-10-CM

## 2020-12-17 DIAGNOSIS — I10 Essential (primary) hypertension: Secondary | ICD-10-CM

## 2020-12-17 DIAGNOSIS — Z5189 Encounter for other specified aftercare: Secondary | ICD-10-CM | POA: Diagnosis not present

## 2020-12-17 DIAGNOSIS — C781 Secondary malignant neoplasm of mediastinum: Secondary | ICD-10-CM | POA: Diagnosis not present

## 2020-12-17 DIAGNOSIS — R5382 Chronic fatigue, unspecified: Secondary | ICD-10-CM

## 2020-12-17 DIAGNOSIS — C7972 Secondary malignant neoplasm of left adrenal gland: Secondary | ICD-10-CM | POA: Diagnosis not present

## 2020-12-17 DIAGNOSIS — Z5112 Encounter for antineoplastic immunotherapy: Secondary | ICD-10-CM | POA: Diagnosis not present

## 2020-12-17 DIAGNOSIS — Z5111 Encounter for antineoplastic chemotherapy: Secondary | ICD-10-CM

## 2020-12-17 LAB — CMP (CANCER CENTER ONLY)
ALT: 26 U/L (ref 0–44)
AST: 19 U/L (ref 15–41)
Albumin: 3.3 g/dL — ABNORMAL LOW (ref 3.5–5.0)
Alkaline Phosphatase: 96 U/L (ref 38–126)
Anion gap: 9 (ref 5–15)
BUN: 14 mg/dL (ref 8–23)
CO2: 24 mmol/L (ref 22–32)
Calcium: 8.9 mg/dL (ref 8.9–10.3)
Chloride: 103 mmol/L (ref 98–111)
Creatinine: 1.11 mg/dL (ref 0.61–1.24)
GFR, Estimated: >60 mL/min (ref >60)
Glucose, Bld: 173 mg/dL — ABNORMAL HIGH (ref 70–99)
Potassium: 4.1 mmol/L (ref 3.5–5.1)
Sodium: 136 mmol/L (ref 135–145)
Total Bilirubin: 0.6 mg/dL (ref 0.3–1.2)
Total Protein: 6.5 g/dL (ref 6.5–8.1)

## 2020-12-17 LAB — CBC WITH DIFFERENTIAL (CANCER CENTER ONLY)
Abs Immature Granulocytes: 0.69 10*3/uL — ABNORMAL HIGH (ref 0.00–0.07)
Basophils Absolute: 0.1 10*3/uL (ref 0.0–0.1)
Basophils Relative: 1 %
Eosinophils Absolute: 0 10*3/uL (ref 0.0–0.5)
Eosinophils Relative: 0 %
HCT: 37 % — ABNORMAL LOW (ref 39.0–52.0)
Hemoglobin: 12.7 g/dL — ABNORMAL LOW (ref 13.0–17.0)
Immature Granulocytes: 10 %
Lymphocytes Relative: 13 %
Lymphs Abs: 0.9 10*3/uL (ref 0.7–4.0)
MCH: 31.8 pg (ref 26.0–34.0)
MCHC: 34.3 g/dL (ref 30.0–36.0)
MCV: 92.5 fL (ref 80.0–100.0)
Monocytes Absolute: 1.2 10*3/uL — ABNORMAL HIGH (ref 0.1–1.0)
Monocytes Relative: 17 %
Neutro Abs: 4.3 10*3/uL (ref 1.7–7.7)
Neutrophils Relative %: 59 %
Platelet Count: 389 10*3/uL (ref 150–400)
RBC: 4 MIL/uL — ABNORMAL LOW (ref 4.22–5.81)
RDW: 13.7 % (ref 11.5–15.5)
Smear Review: NORMAL
WBC Count: 7.2 10*3/uL (ref 4.0–10.5)
nRBC: 0 % (ref 0.0–0.2)

## 2020-12-17 LAB — TSH: TSH: 0.766 u[IU]/mL (ref 0.320–4.118)

## 2020-12-17 MED ORDER — PALONOSETRON HCL INJECTION 0.25 MG/5ML
0.2500 mg | Freq: Once | INTRAVENOUS | Status: AC
  Administered 2020-12-17: 0.25 mg via INTRAVENOUS
  Filled 2020-12-17: qty 5

## 2020-12-17 MED ORDER — SODIUM CHLORIDE 0.9 % IV SOLN
10.0000 mg | Freq: Once | INTRAVENOUS | Status: AC
  Administered 2020-12-17: 10 mg via INTRAVENOUS
  Filled 2020-12-17: qty 10

## 2020-12-17 MED ORDER — SODIUM CHLORIDE 0.9 % IV SOLN
420.0000 mg | Freq: Once | INTRAVENOUS | Status: AC
  Administered 2020-12-17: 420 mg via INTRAVENOUS
  Filled 2020-12-17: qty 42

## 2020-12-17 MED ORDER — SODIUM CHLORIDE 0.9 % IV SOLN: Freq: Once | INTRAVENOUS | Status: AC

## 2020-12-17 MED ORDER — SODIUM CHLORIDE 0.9 % IV SOLN
150.0000 mg | Freq: Once | INTRAVENOUS | Status: AC
  Administered 2020-12-17: 150 mg via INTRAVENOUS
  Filled 2020-12-17: qty 150

## 2020-12-17 MED ORDER — SODIUM CHLORIDE 0.9 % IV SOLN
200.0000 mg | Freq: Once | INTRAVENOUS | Status: AC
  Administered 2020-12-17: 200 mg via INTRAVENOUS
  Filled 2020-12-17: qty 8

## 2020-12-17 MED ORDER — SODIUM CHLORIDE 0.9 % IV SOLN
500.0000 mg/m2 | Freq: Once | INTRAVENOUS | Status: AC
  Administered 2020-12-17: 1000 mg via INTRAVENOUS
  Filled 2020-12-17: qty 40

## 2020-12-17 NOTE — Patient Instructions (Signed)
Steps to Quit Smoking Smoking tobacco is the leading cause of preventable death. It can affect almost every organ in the body. Smoking puts you and people around you at risk for many serious, long-lasting (chronic) diseases. Quitting smoking can be hard, but it is one of the best things that you can do for your health. It is never too late to quit. How do I get ready to quit? When you decide to quit smoking, make a plan to help you succeed. Before you quit: Pick a date to quit. Set a date within the next 2 weeks to give you time to prepare. Write down the reasons why you are quitting. Keep this list in places where you will see it often. Tell your family, friends, and co-workers that you are quitting. Their support is important. Talk with your doctor about the choices that may help you quit. Find out if your health insurance will pay for these treatments. Know the people, places, things, and activities that make you want to smoke (triggers). Avoid them. What first steps can I take to quit smoking? Throw away all cigarettes at home, at work, and in your car. Throw away the things that you use when you smoke, such as ashtrays and lighters. Clean your car. Make sure to empty the ashtray. Clean your home, including curtains and carpets. What can I do to help me quit smoking? Talk with your doctor about taking medicines and seeing a counselor at the same time. You are more likely to succeed when you do both. If you are pregnant or breastfeeding, talk with your doctor about counseling or other ways to quit smoking. Do not take medicine to help you quit smoking unless your doctor tells you to do so. To quit smoking: Quit right away Quit smoking totally, instead of slowly cutting back on how much you smoke over a period of time. Go to counseling. You are more likely to quit if you go to counseling sessions regularly. Take medicine You may take medicines to help you quit. Some medicines need a  prescription, and some you can buy over-the-counter. Some medicines may contain a drug called nicotine to replace the nicotine in cigarettes. Medicines may: Help you to stop having the desire to smoke (cravings). Help to stop the problems that come when you stop smoking (withdrawal symptoms). Your doctor may ask you to use: Nicotine patches, gum, or lozenges. Nicotine inhalers or sprays. Non-nicotine medicine that is taken by mouth. Find resources Find resources and other ways to help you quit smoking and remain smoke-free after you quit. These resources are most helpful when you use them often. They include: Online chats with a counselor. Phone quitlines. Printed self-help materials. Support groups or group counseling. Text messaging programs. Mobile phone apps. Use apps on your mobile phone or tablet that can help you stick to your quit plan. There are many free apps for mobile phones and tablets as well as websites. Examples include Quit Guide from the CDC and smokefree.gov  What things can I do to make it easier to quit?  Talk to your family and friends. Ask them to support and encourage you. Call a phone quitline (1-800-QUIT-NOW), reach out to support groups, or work with a counselor. Ask people who smoke to not smoke around you. Avoid places that make you want to smoke, such as: Bars. Parties. Smoke-break areas at work. Spend time with people who do not smoke. Lower the stress in your life. Stress can make you want to   smoke. Try these things to help your stress: Getting regular exercise. Doing deep-breathing exercises. Doing yoga. Meditating. Doing a body scan. To do this, close your eyes, focus on one area of your body at a time from head to toe. Notice which parts of your body are tense. Try to relax the muscles in those areas. How will I feel when I quit smoking? Day 1 to 3 weeks Within the first 24 hours, you may start to have some problems that come from quitting tobacco.  These problems are very bad 2-3 days after you quit, but they do not often last for more than 2-3 weeks. You may get these symptoms: Mood swings. Feeling restless, nervous, angry, or annoyed. Trouble concentrating. Dizziness. Strong desire for high-sugar foods and nicotine. Weight gain. Trouble pooping (constipation). Feeling like you may vomit (nausea). Coughing or a sore throat. Changes in how the medicines that you take for other issues work in your body. Depression. Trouble sleeping (insomnia). Week 3 and afterward After the first 2-3 weeks of quitting, you may start to notice more positive results, such as: Better sense of smell and taste. Less coughing and sore throat. Slower heart rate. Lower blood pressure. Clearer skin. Better breathing. Fewer sick days. Quitting smoking can be hard. Do not give up if you fail the first time. Some people need to try a few times before they succeed. Do your best to stick to your quit plan, and talk with your doctor if you have any questions or concerns. Summary Smoking tobacco is the leading cause of preventable death. Quitting smoking can be hard, but it is one of the best things that you can do for your health. When you decide to quit smoking, make a plan to help you succeed. Quit smoking right away, not slowly over a period of time. When you start quitting, seek help from your doctor, family, or friends. This information is not intended to replace advice given to you by your health care provider. Make sure you discuss any questions you have with your health care provider. Document Revised: 09/19/2020 Document Reviewed: 04/01/2018 Elsevier Patient Education  2022 Elsevier Inc.  

## 2020-12-17 NOTE — Progress Notes (Signed)
Oncology Nurse Navigator Documentation  Oncology Nurse Navigator Flowsheets 12/17/2020 11/18/2020  Abnormal Finding Date - 09/03/2020  Confirmed Diagnosis Date - 10/30/2020  Diagnosis Status Confirmed Diagnosis Complete Pending Molecular Studies  Planned Course of Treatment Chemotherapy;Targeted Therapy Chemotherapy;Targeted Therapy  Phase of Treatment Targeted Therapy -  Chemotherapy Actual Start Date: 11/27/2020 -  Targeted Therapy Actual Start Date: 11/27/2020 -  Navigator Follow Up Date: 01/08/2021 11/20/2020  Navigator Follow Up Reason: Follow-up Appointment Appointment Review  Navigator Location [facility]  Navigator Encounter Type Clinic/MDC Clinic/MDC;Initial MedOnc  Treatment Initiated Date 11/27/2020 -  Patient Visit Type MedOnc Initial;MedOnc  Treatment Phase Treatment Pre-Tx/Tx Discussion  Barriers/Navigation Needs Education/help to educate on how to take medication of K+. It was nice seeing Joe Hill after his first cycle of systemic therapy.   Education;Coordination of Care  Education Other Newly Diagnosed Cancer Education;Other  Interventions Psycho-Social Support;Education Coordination of Care;Psycho-Social Support;Education  Acuity Level 2-Minimal Needs (1-2 Barriers Identified) Level 3-Moderate Needs (3-4 Barriers Identified)  Coordination of Care - Appts  Education Method Verbal Written;Verbal  Time Spent with Patient 30 45

## 2020-12-17 NOTE — Progress Notes (Signed)
[facility] [phone]   [phone]  OFFICE PROGRESS NOTE  [name], [place] [address]  DIAGNOSIS: stage IV (T2 a, N3, M1 B) non-small cell lung cancer, adenocarcinoma presented with large central left upper lobe lung mass in addition to bilateral mediastinal lymphadenopathy and right upper lobe metastatic nodule as well as left adrenal metastasis diagnosed in October 2022.  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  Molecular studies showed no actionable mutations.  PDL1 Expression 5%  PRIOR THERAPY: None  CURRENT THERAPY: Systemic chemotherapy with carboplatin for AUC of 5, Alimta 500 Mg/M2 and Keytruda 200 Mg IV every 3 weeks, status post 1 cycle  INTERVAL HISTORY: Joe Hill 80 y.o. male returns to the clinic today for follow-up visit.  The patient is feeling fine today with no concerning complaints except for fatigue and the persistent hoarseness of his voice.  He denied having any current chest pain has shortness of breath with exertion with no cough or hemoptysis.  He has no nausea, vomiting, diarrhea or constipation.  He has no headache or visual changes.  He continues to tolerate his treatment with chemotherapy fairly well.  He is here today for evaluation before starting cycle #2.  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  MEDICATIONS:  Current Outpatient Medications  Medication Sig Dispense Refill   apixaban (ELIQUIS) 5 MG TABS tablet Take 1 tablet (5 mg  total) by mouth 2 (two) times daily. Start Saturday night with the pm dose. 60 tablet 11   dextromethorphan-guaiFENesin (MUCINEX DM) 30-600 MG 12hr tablet Take 1 tablet by mouth 2 (two) times daily as needed for cough. (Patient not taking: Reported on 11/18/2020)     diltiazem (CARDIZEM CD) 180 MG 24 hr capsule Take 1 capsule (180 mg total) by mouth daily. 30 capsule 11   doxycycline (VIBRAMYCIN) 100 MG capsule Take 1 capsule (100 mg total) by mouth 2 (two) times daily. One po bid x 7 days 14 capsule 0   folic acid (FOLVITE) 1 MG tablet Take 1 tablet (1 mg total) by mouth daily. 30 tablet 4   potassium chloride SA (KLOR-CON) 20 MEQ tablet Take 1 tablet (20 mEq total) by mouth daily. Take 2 tablets (38mq) for 2 days then take 1 tablet (22m) daily 90 tablet 3   prochlorperazine (COMPAZINE) 10 MG tablet Take 1 tablet (10 mg total) by mouth every 6 (six) hours as needed for nausea or vomiting. 30 tablet 0   No current facility-administered medications for this visit.    SURGICAL HISTORY:  Past Surgical History:  Procedure Laterality Date   APPENDECTOMY     CATARACT EXTRACTION     PROSTATE SURGERY     prostatectomy   VIDEO BRONCHOSCOPY WITH ENDOBRONCHIAL NAVIGATION N/A 10/30/2020   Procedure: VIDEO BRONCHOSCOPY WITH ENDOBRONCHIAL NAVIGATION;  Surgeon: HeMelrose NakayamaMD;  [facility] Service: Thoracic;  Laterality: N/A;   VIDEO BRONCHOSCOPY WITH ENDOBRONCHIAL ULTRASOUND N/A 10/30/2020   Procedure: VIDEO BRONCHOSCOPY WITH ENDOBRONCHIAL ULTRASOUND;  Surgeon:  Melrose Nakayama, MD;  [facility];  Service: Thoracic;  Laterality: N/A;    REVIEW OF SYSTEMS:  A comprehensive review of systems was negative except for: Constitutional: positive for fatigue Ears, nose, mouth, throat, and face: positive for hoarseness Respiratory: positive for dyspnea on exertion   PHYSICAL EXAMINATION: General appearance: alert, cooperative, fatigued, and no distress Head: Normocephalic, without  obvious abnormality, atraumatic Neck: no adenopathy, no JVD, supple, symmetrical, trachea midline, and thyroid not enlarged, symmetric, no tenderness/mass/nodules Lymph nodes: Cervical, supraclavicular, and axillary nodes normal. Resp: clear to auscultation bilaterally Back: symmetric, no curvature. ROM normal. No CVA tenderness. Cardio: regular rate and rhythm, S1, S2 normal, no murmur, click, rub or gallop GI: soft, non-tender; bowel sounds normal; no masses,  no organomegaly Extremities: extremities normal, atraumatic, no cyanosis or edema  ECOG PERFORMANCE STATUS: 1 - Symptomatic but completely ambulatory  There were no vitals taken for this visit.  LABORATORY DATA: Lab Results  Component Value Date   WBC 7.2 12/17/2020   HGB 12.7 (L) 12/17/2020   HCT 37.0 (L) 12/17/2020   MCV 92.5 12/17/2020   PLT 389 12/17/2020      Chemistry      Component Value Date/Time   NA 136 12/15/2020 1555   NA 137 11/04/2020 0840   K 4.2 12/15/2020 1555   CL 101 12/15/2020 1555   CO2 28 12/15/2020 1555   BUN 17 12/15/2020 1555   BUN 15 11/04/2020 0840   CREATININE 1.22 12/15/2020 1555   CREATININE 1.10 12/11/2020 1546   CREATININE 0.97 10/15/2019 0907      Component Value Date/Time   CALCIUM 9.2 12/15/2020 1555   ALKPHOS 100 12/11/2020 1546   AST 39 12/11/2020 1546   ALT 62 (H) 12/11/2020 1546   BILITOT 0.7 12/11/2020 1546       RADIOGRAPHIC STUDIES: DG Chest 2 View  Result Date: 12/15/2020 CLINICAL DATA:  Cough and congestion EXAM: CHEST - 2 VIEW COMPARISON:  Chest x-ray dated October 28, 2020 FINDINGS: Cardiac and mediastinal contours within normal limits. Unchanged left upper lobe mass. New mild bibasilar opacities. No pleural effusion or pneumothorax. Inferior right costophrenic angle is excluded from the field of view. IMPRESSION: 1. New mild bibasilar opacities differential includes atelectasis, aspiration or infection. 2. Left upper lobe mass, similar to prior radiograph.  Electronically Signed   By: Yetta Glassman M.D.   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 The patient started systemic chemotherapy with carboplatin for AUC of 5, Alimta 500 Mg/M2 and Keytruda 200 Mg IV every 3 weeks status post 1 cycle. He tolerated the first cycle of his treatment well except for fatigue that lasted several days after the treatment. I recommended for the patient to proceed with cycle #2 today as planned. I will see the patient back for follow-up visit in 3 weeks for evaluation before starting cycle #3 of his treatment. The patient was advised to call immediately if he has any other concerning symptoms in the interval. The patient voices understanding of current disease status and treatment options and is in agreement with the current care plan.  All  questions were answered. The patient knows to call the clinic with any problems, questions or concerns. We can certainly see the patient much sooner if necessary.   Disclaimer: This note was dictated with voice recognition software. Similar sounding words can inadvertently be transcribed and may not be corrected upon review.

## 2020-12-17 NOTE — Patient Instructions (Signed)
[facility]  Discharge Instructions: Thank you for choosing [facility] to provide your oncology and hematology care.   If you have a lab appointment with the [facility], please go directly to the [facility] and check in at the registration area.   Wear comfortable clothing and clothing appropriate for easy access to any Portacath or PICC line.   We strive to give you quality time with your provider. You may need to reschedule your appointment if you arrive late (15 or more minutes).  Arriving late affects you and other patients whose appointments are after yours.  Also, if you miss three or more appointments without notifying the office, you may be dismissed from the clinic at the provider's discretion.      For prescription refill requests, have your pharmacy contact our office and allow 72 hours for refills to be completed.    Today you received the following chemotherapy and/or immunotherapy agents Pembrolizumab, Pemetrexed, and Carboplatin      To help prevent nausea and vomiting after your treatment, we encourage you to take your nausea medication as directed.  BELOW ARE SYMPTOMS THAT SHOULD BE REPORTED IMMEDIATELY: *FEVER GREATER THAN 100.4 F (38 C) OR HIGHER *CHILLS OR SWEATING *NAUSEA AND VOMITING THAT IS NOT CONTROLLED WITH YOUR NAUSEA MEDICATION *UNUSUAL SHORTNESS OF BREATH *UNUSUAL BRUISING OR BLEEDING *URINARY PROBLEMS (pain or burning when urinating, or frequent urination) *BOWEL PROBLEMS (unusual diarrhea, constipation, pain near the anus) TENDERNESS IN MOUTH AND THROAT WITH OR WITHOUT PRESENCE OF ULCERS (sore throat, sores in mouth, or a toothache) UNUSUAL RASH, SWELLING OR PAIN  UNUSUAL VAGINAL DISCHARGE OR ITCHING   Items with * indicate a potential emergency and should be followed up as soon as possible or go to the Emergency Department if any problems should occur.  Please show the CHEMOTHERAPY ALERT CARD or  IMMUNOTHERAPY ALERT CARD at check-in to the Emergency Department and triage nurse.  Should you have questions after your visit or need to cancel or reschedule your appointment, please contact [facility]  Dept: [phone]  and follow the prompts.  Office hours are 8:00 a.m. to 4:30 p.m. Monday - Friday. Please note that voicemails left after 4:00 p.m. may not be returned until the following business day.  We are closed weekends and major holidays. You have access to a nurse at all times for urgent questions. Please call the main number to the clinic Dept: [phone] and follow the prompts.   For any non-urgent questions, you may also contact your provider using MyChart. We now offer e-Visits for anyone 43 and older to request care online for non-urgent symptoms. For details visit [email or web address].   Also download the MyChart app! Go to the app store, search "MyChart", open the app, select [facility], and log in with your MyChart username and password.  Due to Covid, a mask is required upon entering the hospital/clinic. If you do not have a mask, one will be given to you upon arrival. For doctor visits, patients may have 1 support person aged 55 or older with them. For treatment visits, patients cannot have anyone with them due to current Covid guidelines and our immunocompromised population.

## 2020-12-24 ENCOUNTER — Emergency Department (HOSPITAL_COMMUNITY): Payer: Medicare Other

## 2020-12-24 ENCOUNTER — Other Ambulatory Visit: Payer: Self-pay

## 2020-12-24 ENCOUNTER — Emergency Department (HOSPITAL_COMMUNITY)
Admission: EM | Admit: 2020-12-24 | Discharge: 2020-12-24 | Disposition: A | Discharge status: Home / Self Care | Payer: Medicare Other | Source: Home / Self Care | Attending: Emergency Medicine | Admitting: Emergency Medicine

## 2020-12-24 ENCOUNTER — Telehealth: Payer: Self-pay

## 2020-12-24 ENCOUNTER — Encounter (HOSPITAL_COMMUNITY): Payer: Self-pay

## 2020-12-24 DIAGNOSIS — F17210 Nicotine dependence, cigarettes, uncomplicated: Secondary | ICD-10-CM | POA: Insufficient documentation

## 2020-12-24 DIAGNOSIS — S20212A Contusion of left front wall of thorax, initial encounter: Secondary | ICD-10-CM | POA: Diagnosis not present

## 2020-12-24 DIAGNOSIS — I2510 Atherosclerotic heart disease of native coronary artery without angina pectoris: Secondary | ICD-10-CM | POA: Diagnosis not present

## 2020-12-24 DIAGNOSIS — I10 Essential (primary) hypertension: Secondary | ICD-10-CM | POA: Insufficient documentation

## 2020-12-24 DIAGNOSIS — R0781 Pleurodynia: Secondary | ICD-10-CM | POA: Diagnosis not present

## 2020-12-24 DIAGNOSIS — I4891 Unspecified atrial fibrillation: Secondary | ICD-10-CM | POA: Insufficient documentation

## 2020-12-24 DIAGNOSIS — C3492 Malignant neoplasm of unspecified part of left bronchus or lung: Secondary | ICD-10-CM | POA: Diagnosis not present

## 2020-12-24 DIAGNOSIS — J189 Pneumonia, unspecified organism: Secondary | ICD-10-CM

## 2020-12-24 DIAGNOSIS — Z7951 Long term (current) use of inhaled steroids: Secondary | ICD-10-CM | POA: Insufficient documentation

## 2020-12-24 DIAGNOSIS — Z7901 Long term (current) use of anticoagulants: Secondary | ICD-10-CM | POA: Insufficient documentation

## 2020-12-24 DIAGNOSIS — J188 Other pneumonia, unspecified organism: Secondary | ICD-10-CM | POA: Insufficient documentation

## 2020-12-24 DIAGNOSIS — Z20822 Contact with and (suspected) exposure to covid-19: Secondary | ICD-10-CM | POA: Diagnosis not present

## 2020-12-24 DIAGNOSIS — J449 Chronic obstructive pulmonary disease, unspecified: Secondary | ICD-10-CM | POA: Insufficient documentation

## 2020-12-24 DIAGNOSIS — C7972 Secondary malignant neoplasm of left adrenal gland: Secondary | ICD-10-CM | POA: Diagnosis not present

## 2020-12-24 DIAGNOSIS — J439 Emphysema, unspecified: Secondary | ICD-10-CM | POA: Diagnosis not present

## 2020-12-24 DIAGNOSIS — S0990XA Unspecified injury of head, initial encounter: Secondary | ICD-10-CM | POA: Diagnosis not present

## 2020-12-24 DIAGNOSIS — J9601 Acute respiratory failure with hypoxia: Secondary | ICD-10-CM | POA: Diagnosis not present

## 2020-12-24 DIAGNOSIS — S299XXA Unspecified injury of thorax, initial encounter: Secondary | ICD-10-CM | POA: Diagnosis not present

## 2020-12-24 DIAGNOSIS — Z8546 Personal history of malignant neoplasm of prostate: Secondary | ICD-10-CM | POA: Insufficient documentation

## 2020-12-24 DIAGNOSIS — M2578 Osteophyte, vertebrae: Secondary | ICD-10-CM | POA: Diagnosis not present

## 2020-12-24 DIAGNOSIS — C3412 Malignant neoplasm of upper lobe, left bronchus or lung: Secondary | ICD-10-CM | POA: Diagnosis not present

## 2020-12-24 DIAGNOSIS — R55 Syncope and collapse: Secondary | ICD-10-CM | POA: Diagnosis not present

## 2020-12-24 DIAGNOSIS — R000 Tachycardia, unspecified: Secondary | ICD-10-CM | POA: Insufficient documentation

## 2020-12-24 DIAGNOSIS — R911 Solitary pulmonary nodule: Secondary | ICD-10-CM | POA: Diagnosis not present

## 2020-12-24 DIAGNOSIS — J3801 Paralysis of vocal cords and larynx, unilateral: Secondary | ICD-10-CM

## 2020-12-24 DIAGNOSIS — Z85118 Personal history of other malignant neoplasm of bronchus and lung: Secondary | ICD-10-CM | POA: Diagnosis not present

## 2020-12-24 DIAGNOSIS — R918 Other nonspecific abnormal finding of lung field: Secondary | ICD-10-CM | POA: Diagnosis not present

## 2020-12-24 DIAGNOSIS — R0600 Dyspnea, unspecified: Secondary | ICD-10-CM | POA: Diagnosis not present

## 2020-12-24 DIAGNOSIS — R0602 Shortness of breath: Secondary | ICD-10-CM | POA: Diagnosis not present

## 2020-12-24 DIAGNOSIS — D61818 Other pancytopenia: Secondary | ICD-10-CM | POA: Diagnosis not present

## 2020-12-24 DIAGNOSIS — M47812 Spondylosis without myelopathy or radiculopathy, cervical region: Secondary | ICD-10-CM | POA: Diagnosis not present

## 2020-12-24 LAB — CBC WITH DIFFERENTIAL/PLATELET
Abs Immature Granulocytes: 0.03 10*3/uL (ref 0.00–0.07)
Basophils Absolute: 0 10*3/uL (ref 0.0–0.1)
Basophils Relative: 1 %
Eosinophils Absolute: 0 10*3/uL (ref 0.0–0.5)
Eosinophils Relative: 0 %
HCT: 35.1 % — ABNORMAL LOW (ref 39.0–52.0)
Hemoglobin: 12.2 g/dL — ABNORMAL LOW (ref 13.0–17.0)
Immature Granulocytes: 1 %
Lymphocytes Relative: 13 %
Lymphs Abs: 0.5 10*3/uL — ABNORMAL LOW (ref 0.7–4.0)
MCH: 32.5 pg (ref 26.0–34.0)
MCHC: 34.8 g/dL (ref 30.0–36.0)
MCV: 93.6 fL (ref 80.0–100.0)
Monocytes Absolute: 0.1 10*3/uL (ref 0.1–1.0)
Monocytes Relative: 2 %
Neutro Abs: 3.2 10*3/uL (ref 1.7–7.7)
Neutrophils Relative %: 83 %
Platelets: 199 10*3/uL (ref 150–400)
RBC: 3.75 MIL/uL — ABNORMAL LOW (ref 4.22–5.81)
RDW: 13.7 % (ref 11.5–15.5)
WBC: 3.8 10*3/uL — ABNORMAL LOW (ref 4.0–10.5)
nRBC: 0 % (ref 0.0–0.2)

## 2020-12-24 LAB — BASIC METABOLIC PANEL
Anion gap: 10 (ref 5–15)
BUN: 47 mg/dL — ABNORMAL HIGH (ref 8–23)
CO2: 25 mmol/L (ref 22–32)
Calcium: 8.9 mg/dL (ref 8.9–10.3)
Chloride: 102 mmol/L (ref 98–111)
Creatinine, Ser: 1.15 mg/dL (ref 0.61–1.24)
GFR, Estimated: >60 mL/min (ref >60)
Glucose, Bld: 125 mg/dL — ABNORMAL HIGH (ref 70–99)
Potassium: 4 mmol/L (ref 3.5–5.1)
Sodium: 137 mmol/L (ref 135–145)

## 2020-12-24 LAB — RESP PANEL BY RT-PCR (FLU A&B, COVID) ARPGX2
Influenza A by PCR: NEGATIVE
Influenza B by PCR: NEGATIVE
SARS Coronavirus 2 by RT PCR: NEGATIVE

## 2020-12-24 LAB — BRAIN NATRIURETIC PEPTIDE: B Natriuretic Peptide: 256.2 pg/mL — ABNORMAL HIGH (ref 0.0–100.0)

## 2020-12-24 MED ORDER — AZITHROMYCIN 250 MG PO TABS: 250.0000 mg | ORAL_TABLET | Freq: Every day | ORAL | 0 refills | Status: DC

## 2020-12-24 MED ORDER — DILTIAZEM HCL ER COATED BEADS 120 MG PO CP24
240.0000 mg | ORAL_CAPSULE | Freq: Once | ORAL | Status: AC
  Administered 2020-12-24: 240 mg via ORAL
  Filled 2020-12-24: qty 2

## 2020-12-24 MED ORDER — ALBUTEROL SULFATE HFA 108 (90 BASE) MCG/ACT IN AERS: 2.0000 | INHALATION_SPRAY | RESPIRATORY_TRACT | Status: DC | PRN

## 2020-12-24 MED ORDER — DILTIAZEM HCL ER COATED BEADS 120 MG PO CP24: 240.0000 mg | ORAL_CAPSULE | Freq: Every day | ORAL | 2 refills | Status: DC

## 2020-12-24 MED ORDER — DILTIAZEM HCL 25 MG/5ML IV SOLN
15.0000 mg | Freq: Once | INTRAVENOUS | Status: AC
  Administered 2020-12-24: 15 mg via INTRAVENOUS
  Filled 2020-12-24 (×2): qty 5

## 2020-12-24 MED ORDER — SODIUM CHLORIDE 0.9 % IV SOLN
500.0000 mg | Freq: Once | INTRAVENOUS | Status: AC
  Administered 2020-12-24: 500 mg via INTRAVENOUS
  Filled 2020-12-24: qty 500

## 2020-12-24 MED ORDER — IOHEXOL 350 MG/ML SOLN
80.0000 mL | Freq: Once | INTRAVENOUS | Status: AC | PRN
  Administered 2020-12-24: 75 mL via INTRAVENOUS

## 2020-12-24 NOTE — Discharge Instructions (Addendum)
Make an appointment to follow-up with your cardiologist.  Increase your Cardizem to 240 mg tablet daily.  This is to help control your atrial fibrillation.  Take the Zithromax as prescribed for possible left lower lobe pneumonia.  No evidence of any blood clots on the CAT scan of your chest.  Return for any new or worse symptoms.  Make an appointment to follow-up with your regular doctor.

## 2020-12-24 NOTE — ED Provider Notes (Addendum)
Patient will be discharged home on azithromycin for questionable pneumonia but he does not have a leukocytosis.  CT angio ruled out blood clot in the lungs.  We raised some concerns about pneumonia.  Ordered oral azithromycin.  But the order went in wrong and I was doing conscious sedation and it went into his IV so he is currently receiving it IV which unfortunately will be a longer delay for him.  He is stable for discharge and follow-up.  Patient will also increase his cardio exam based on the dose he received here that seem to help control the atrial fibrillation rate 240 mg of the Cardizem CD.   Fredia Sorrow, MD 12/24/20 2053    Fredia Sorrow, MD 12/24/20 2053

## 2020-12-24 NOTE — ED Provider Notes (Signed)
[name] DEPT Provider Note   CSN: [id number] Arrival date & time: 12/24/20  1108     History Chief Complaint  Patient presents with   Shortness of Breath    Joe Hill is a 80 y.o. male.   Shortness of Breath  Patient presents to the ED for evaluation of shortness of breath.  Patient states his last chemo treatment was on November 23.  Joe Hill is getting treated for lung cancer.  Patient also has a history of COPD.  Patient states over the last 4 days or so Joe Hill has had increased difficulty with his breathing.  Joe Hill has not been coughing much but feels very short of breath.  Symptoms get worse with activity.  Joe Hill does not have any fevers or chills.  Joe Hill also has noticed his heart palpitating.  Joe Hill does have history of A. fib.  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   Aortic atherosclerosis ([place]) 10/28/2020   Dyslipidemia 10/23/2016   Abnormal CT scan, chest 09/07/2010   EMPHYSEMA 10/16/2009   GERD 09/09/2009   Hyperglycemia 08/12/2009   Essential hypertension 08/04/2007   TOBACCO USE 01/30/2007   PROSTATE CANCER, 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/11/2006    Past Surgical History:  Procedure Laterality Date   APPENDECTOMY     CATARACT EXTRACTION     PROSTATE SURGERY     prostatectomy   VIDEO BRONCHOSCOPY WITH ENDOBRONCHIAL  NAVIGATION N/A 10/30/2020   Procedure: VIDEO BRONCHOSCOPY WITH ENDOBRONCHIAL NAVIGATION;  Surgeon: Melrose Nakayama, MD;  Location: [facility] OR;  Service: Thoracic;  Laterality: N/A;   VIDEO BRONCHOSCOPY WITH ENDOBRONCHIAL ULTRASOUND N/A 10/30/2020   Procedure: VIDEO BRONCHOSCOPY WITH ENDOBRONCHIAL ULTRASOUND;  Surgeon: Melrose Nakayama, MD;  Location: [facility] OR;  Service: Thoracic;  Laterality: N/A;       Family History  Problem Relation Age of Onset   Hyperlipidemia Mother    Cancer Father        lung, smoker   Cancer Sister        breast, smoker   Arthritis Maternal Aunt     Social History   Tobacco Use   Smoking status: Every Day    Packs/day: 0.25    Types: Cigarettes   Smokeless tobacco: Never  Vaping Use   Vaping Use: Never used  Substance Use Topics   Alcohol use: No    Alcohol/week: 0.0 standard drinks   Drug use: No    Home Medications Prior to Admission medications   Medication Sig Start Date End Date Taking? Authorizing Provider  albuterol (VENTOLIN HFA) 108 (90 Base) MCG/ACT inhaler Inhale 2 puffs into the lungs every 6 (six) hours as needed for wheezing. 12/15/20  Yes [provider]  apixaban (ELIQUIS) 5 MG TABS tablet Take 1 tablet (5 mg total) by mouth 2 (two) times daily. Start Saturday night with the pm dose.  11/01/20  Yes Barrett, Evelene Croon, PA-C  dextromethorphan-guaiFENesin (MUCINEX DM) 30-600 MG 12hr tablet Take 1 tablet by mouth 2 (two) times daily as needed for cough.   Yes [provider]  diltiazem (CARDIZEM CD) 180 MG 24 hr capsule Take 1 capsule (180 mg total) by mouth daily. 10/30/20 10/30/21 Yes Barrett, Evelene Croon, PA-C  feeding supplement (BOOST HIGH PROTEIN) LIQD Take 1 Container by mouth 2 (two) times daily.   Yes [provider]  folic acid (FOLVITE) 1 MG tablet Take 1 tablet (1 mg total) by mouth daily. 11/18/20  Yes Curt Bears, MD  Nutritional Supplements (ENSURE COMPLETE PO) Take 1 Bottle by mouth daily.   Yes  [provider]  prochlorperazine (COMPAZINE) 10 MG tablet Take 1 tablet (10 mg total) by mouth every 6 (six) hours as needed for nausea or vomiting. 11/18/20  Yes Curt Bears, MD  potassium chloride SA (KLOR-CON) 20 MEQ tablet Take 1 tablet (20 mEq total) by mouth daily. Take 2 tablets (62meq) for 2 days then take 1 tablet (53meq) daily Patient not taking: Reported on 12/17/2020 11/17/20   Shirley Friar, PA-C    Allergies    Penicillins  Review of Systems   Review of Systems  Respiratory:  Positive for shortness of breath.   All other systems reviewed and are negative.  Physical Exam Updated Vital Signs BP 119/88   Pulse (!) 101   Temp 98.2 F (36.8 C) (Oral)   Resp 18   SpO2 98%   Physical Exam Vitals and nursing note reviewed.  Constitutional:      Appearance: Joe Hill is ill-appearing. Joe Hill is not diaphoretic.  HENT:     Head: Normocephalic and atraumatic.     Right Ear: External ear normal.     Left Ear: External ear normal.  Eyes:     General: No scleral icterus.       Right eye: No discharge.        Left eye: No discharge.     Conjunctiva/sclera: Conjunctivae normal.  Neck:     Trachea: No tracheal deviation.  Cardiovascular:     Rate and Rhythm: Tachycardia present. Rhythm regularly irregular.  Pulmonary:     Effort: Pulmonary effort is normal. No respiratory distress.     Breath sounds: Normal breath sounds. No stridor. No wheezing or rales.  Abdominal:     General: Bowel sounds are normal. There is no distension.     Palpations: Abdomen is soft.     Tenderness: There is no abdominal tenderness. There is no guarding or rebound.  Musculoskeletal:        General: No tenderness or deformity.     Cervical back: Neck supple.  Skin:    General: Skin is warm and dry.     Findings: No rash.  Neurological:     General: No focal deficit present.     Mental Status: Joe Hill is alert.     Cranial Nerves: No cranial nerve deficit (no facial droop,  extraocular movements intact, no slurred speech).     Sensory: No sensory deficit.     Motor: No abnormal muscle tone or seizure activity.     Coordination: Coordination normal.  Psychiatric:        Mood and Affect: Mood normal.    ED Results / Procedures / Treatments   Labs (all labs ordered are listed, but only abnormal results are displayed) Labs Reviewed  CBC WITH DIFFERENTIAL/PLATELET - Abnormal; Notable for the following components:  Result Value   WBC 3.8 (*)    RBC 3.75 (*)    Hemoglobin 12.2 (*)    HCT 35.1 (*)    All other components within normal limits  BRAIN NATRIURETIC PEPTIDE - Abnormal; Notable for the following components:   B Natriuretic Peptide 256.2 (*)    All other components within normal limits  BASIC METABOLIC PANEL - Abnormal; Notable for the following components:   Glucose, Bld 125 (*)    BUN 47 (*)    All other components within normal limits  RESP PANEL BY RT-PCR (FLU A&B, COVID) [id number]    EKG EKG Interpretation  Date/Time:  Wednesday December 24 2020 11:20:40 EST Ventricular Rate:  157 PR Interval:    QRS Duration: 92 QT Interval:  270 QTC Calculation: 437 R Axis:   39 Text Interpretation: Atrial fibrillation with rapid V-rate Low voltage, extremity leads Repolarization abnormality, prob rate related Since last tracing rate faster Confirmed by Dorie Rank [phone]) on 12/24/2020 12:03:02 PM  Radiology DG Chest 2 View  Result Date: 12/24/2020 CLINICAL DATA:  Shortness of breath, difficulty breathing EXAM: CHEST - 2 VIEW COMPARISON:  Chest radiograph 12/15/2020 FINDINGS: The cardiomediastinal silhouette is stable. Increased interstitial markings are seen throughout both lungs likely reflecting chronic changes on a background of emphysema as seen on prior CT chest. Patchy opacities in the left base are unchanged. There is no new or worsening focal airspace disease. The known left upper lobe nodule is not well assessed on the current study. There  is no pleural effusion or pneumothorax. There is no acute osseous abnormality. IMPRESSION: Patchy opacities in the left base, not significantly changed and again favored to reflect atelectasis. No new or worsening airspace disease. Electronically Signed   By: Valetta Mole M.D.   On: 12/24/2020 11:49    Procedures Procedures   Medications Ordered in ED Medications  albuterol (VENTOLIN HFA) 108 (90 Base) MCG/ACT inhaler 2 puff (has no administration in time range)  diltiazem (CARDIZEM CD) 24 hr capsule 240 mg (has no administration in time range)  diltiazem (CARDIZEM) injection 15 mg (15 mg Intravenous Given 12/24/20 1259)  iohexol (OMNIPAQUE) 350 MG/ML injection 80 mL (75 mLs Intravenous Contrast Given 12/24/20 1625)    ED Course  I have reviewed the triage vital signs and the nursing notes.  Pertinent labs & imaging results that were available during my care of the patient were reviewed by me and considered in my medical decision making (see chart for details).  Clinical Course as of 12/24/20 1625  Wed Dec 24, 2020  [place] and flu are negative.  Metabolic panel unremarkable.  CBC CBC shows stable anemia [JK]  1357 Chest x-ray does not show any definite pneumonia [JK]  1508 Heart rate has improved after his dose of Cardizem.  We will give a dose of Cardizem CD to 240 mg, increased from his 180 to help maintain his rate [JK]  1623 CT angiogram pending to rule out pulmonary embolism [JK]    Clinical Course User Index [JK] Dorie Rank, MD   MDM Rules/Calculators/A&P                           Patient presented to the ED for evaluation of shortness of breath.  Patient has multiple medical problems including COPD and lung cancer.  No wheezing noted on exam.  No fever to suggest pneumonia.  Patient was in atrial fibrillation with rapid rate.  Joe Hill was as  high as the 140s.  Joe Hill was given a dose of Cardizem with significant improvement of his symptoms.  However with his complaint of shortness  of breath concerned about the possibility of pulmonary embolism.  CT angiogram was performed.  Plan on following up on the CT scan if negative Joe Hill will be appropriate for discharge with increasing his Cardizem dose.  Care turned over to oncoming team Final Clinical Impression(s) / ED Diagnoses Final diagnoses:  Atrial fibrillation, rapid [facility])    Rx / DC Orders ED Discharge Orders     None        Dorie Rank, MD 12/24/20 1625

## 2020-12-24 NOTE — ED Triage Notes (Addendum)
Pt c/o trouble breathing. Pt states he had his last chemo tx 11/23. Pt states he has difficulty catching his breath normally but it has worsened over the last 3-4 days. Pt has lung cancer. Pt denies fever and chills.  Pt states he has A Fib, pt states he has been taking his Eliquis as prescribed.

## 2020-12-24 NOTE — Telephone Encounter (Signed)
Per Dr. Julien Nordmann, pt should be seen in the ER. Unable to reach pt and records indicate he is in the ER.

## 2020-12-24 NOTE — Telephone Encounter (Signed)
Pt called stating he has been SOB for 3hrs, worse when he lays down. Denies fever and CP. Pt recently in the ED and dx with pneumonia. Pt states he has completed the antibiotics and is using his albuterol inhaler and Mucinex.

## 2020-12-25 ENCOUNTER — Inpatient Hospital Stay: Payer: Medicare Other | Attending: Internal Medicine

## 2020-12-25 ENCOUNTER — Other Ambulatory Visit: Payer: Self-pay

## 2020-12-25 DIAGNOSIS — Z5112 Encounter for antineoplastic immunotherapy: Secondary | ICD-10-CM | POA: Diagnosis not present

## 2020-12-25 DIAGNOSIS — Z85118 Personal history of other malignant neoplasm of bronchus and lung: Secondary | ICD-10-CM | POA: Diagnosis not present

## 2020-12-25 DIAGNOSIS — Z86718 Personal history of other venous thrombosis and embolism: Secondary | ICD-10-CM | POA: Diagnosis not present

## 2020-12-25 DIAGNOSIS — Z5111 Encounter for antineoplastic chemotherapy: Secondary | ICD-10-CM | POA: Insufficient documentation

## 2020-12-25 DIAGNOSIS — I10 Essential (primary) hypertension: Secondary | ICD-10-CM | POA: Insufficient documentation

## 2020-12-25 DIAGNOSIS — D61818 Other pancytopenia: Secondary | ICD-10-CM | POA: Insufficient documentation

## 2020-12-25 DIAGNOSIS — C7972 Secondary malignant neoplasm of left adrenal gland: Secondary | ICD-10-CM | POA: Diagnosis not present

## 2020-12-25 DIAGNOSIS — C3412 Malignant neoplasm of upper lobe, left bronchus or lung: Secondary | ICD-10-CM | POA: Diagnosis present

## 2020-12-25 DIAGNOSIS — Z79899 Other long term (current) drug therapy: Secondary | ICD-10-CM | POA: Insufficient documentation

## 2020-12-25 DIAGNOSIS — Z7901 Long term (current) use of anticoagulants: Secondary | ICD-10-CM | POA: Diagnosis not present

## 2020-12-25 DIAGNOSIS — E860 Dehydration: Secondary | ICD-10-CM | POA: Insufficient documentation

## 2020-12-25 DIAGNOSIS — Z9079 Acquired absence of other genital organ(s): Secondary | ICD-10-CM | POA: Diagnosis not present

## 2020-12-25 DIAGNOSIS — Z8546 Personal history of malignant neoplasm of prostate: Secondary | ICD-10-CM | POA: Insufficient documentation

## 2020-12-25 DIAGNOSIS — Z8673 Personal history of transient ischemic attack (TIA), and cerebral infarction without residual deficits: Secondary | ICD-10-CM | POA: Diagnosis not present

## 2020-12-25 DIAGNOSIS — I4891 Unspecified atrial fibrillation: Secondary | ICD-10-CM | POA: Insufficient documentation

## 2020-12-25 LAB — CBC WITH DIFFERENTIAL (CANCER CENTER ONLY)
Abs Immature Granulocytes: 0.03 10*3/uL (ref 0.00–0.07)
Basophils Absolute: 0 10*3/uL (ref 0.0–0.1)
Basophils Relative: 0 %
Eosinophils Absolute: 0 10*3/uL (ref 0.0–0.5)
Eosinophils Relative: 0 %
HCT: 33.1 % — ABNORMAL LOW (ref 39.0–52.0)
Hemoglobin: 11.3 g/dL — ABNORMAL LOW (ref 13.0–17.0)
Immature Granulocytes: 1 %
Lymphocytes Relative: 15 %
Lymphs Abs: 0.8 10*3/uL (ref 0.7–4.0)
MCH: 31.6 pg (ref 26.0–34.0)
MCHC: 34.1 g/dL (ref 30.0–36.0)
MCV: 92.5 fL (ref 80.0–100.0)
Monocytes Absolute: 0.1 10*3/uL (ref 0.1–1.0)
Monocytes Relative: 2 %
Neutro Abs: 4.3 10*3/uL (ref 1.7–7.7)
Neutrophils Relative %: 82 %
Platelet Count: 163 10*3/uL (ref 150–400)
RBC: 3.58 MIL/uL — ABNORMAL LOW (ref 4.22–5.81)
RDW: 13.6 % (ref 11.5–15.5)
WBC Count: 5.3 10*3/uL (ref 4.0–10.5)
nRBC: 0 % (ref 0.0–0.2)

## 2020-12-25 LAB — CMP (CANCER CENTER ONLY)
ALT: 63 U/L — ABNORMAL HIGH (ref 0–44)
AST: 40 U/L (ref 15–41)
Albumin: 3.5 g/dL (ref 3.5–5.0)
Alkaline Phosphatase: 121 U/L (ref 38–126)
Anion gap: 11 (ref 5–15)
BUN: 53 mg/dL — ABNORMAL HIGH (ref 8–23)
CO2: 26 mmol/L (ref 22–32)
Calcium: 8.7 mg/dL — ABNORMAL LOW (ref 8.9–10.3)
Chloride: 104 mmol/L (ref 98–111)
Creatinine: 1.47 mg/dL — ABNORMAL HIGH (ref 0.61–1.24)
GFR, Estimated: 48 mL/min — ABNORMAL LOW (ref >60)
Glucose, Bld: 118 mg/dL — ABNORMAL HIGH (ref 70–99)
Potassium: 3.9 mmol/L (ref 3.5–5.1)
Sodium: 141 mmol/L (ref 135–145)
Total Bilirubin: 1.2 mg/dL (ref 0.3–1.2)
Total Protein: 6.7 g/dL (ref 6.5–8.1)

## 2020-12-26 ENCOUNTER — Encounter (HOSPITAL_COMMUNITY): Payer: Self-pay | Admitting: Internal Medicine

## 2020-12-26 ENCOUNTER — Emergency Department (HOSPITAL_COMMUNITY): Payer: Medicare Other

## 2020-12-26 ENCOUNTER — Inpatient Hospital Stay (HOSPITAL_COMMUNITY)
Admission: EM | Admit: 2020-12-26 | Discharge: 2021-01-01 | DRG: 193 | Disposition: A | Discharge status: Home / Self Care | Payer: Medicare Other | Attending: Internal Medicine | Admitting: Internal Medicine

## 2020-12-26 ENCOUNTER — Other Ambulatory Visit: Payer: Self-pay

## 2020-12-26 DIAGNOSIS — R918 Other nonspecific abnormal finding of lung field: Secondary | ICD-10-CM | POA: Diagnosis not present

## 2020-12-26 DIAGNOSIS — D701 Agranulocytosis secondary to cancer chemotherapy: Secondary | ICD-10-CM | POA: Diagnosis present

## 2020-12-26 DIAGNOSIS — Z20822 Contact with and (suspected) exposure to covid-19: Secondary | ICD-10-CM | POA: Diagnosis present

## 2020-12-26 DIAGNOSIS — F17210 Nicotine dependence, cigarettes, uncomplicated: Secondary | ICD-10-CM | POA: Diagnosis present

## 2020-12-26 DIAGNOSIS — K219 Gastro-esophageal reflux disease without esophagitis: Secondary | ICD-10-CM | POA: Diagnosis present

## 2020-12-26 DIAGNOSIS — I2694 Multiple subsegmental pulmonary emboli without acute cor pulmonale: Secondary | ICD-10-CM

## 2020-12-26 DIAGNOSIS — I4891 Unspecified atrial fibrillation: Secondary | ICD-10-CM | POA: Diagnosis not present

## 2020-12-26 DIAGNOSIS — D638 Anemia in other chronic diseases classified elsewhere: Secondary | ICD-10-CM | POA: Diagnosis present

## 2020-12-26 DIAGNOSIS — D61818 Other pancytopenia: Secondary | ICD-10-CM | POA: Diagnosis not present

## 2020-12-26 DIAGNOSIS — J189 Pneumonia, unspecified organism: Secondary | ICD-10-CM | POA: Diagnosis not present

## 2020-12-26 DIAGNOSIS — E876 Hypokalemia: Secondary | ICD-10-CM | POA: Diagnosis present

## 2020-12-26 DIAGNOSIS — Z79899 Other long term (current) drug therapy: Secondary | ICD-10-CM

## 2020-12-26 DIAGNOSIS — I480 Paroxysmal atrial fibrillation: Secondary | ICD-10-CM | POA: Diagnosis present

## 2020-12-26 DIAGNOSIS — S20212A Contusion of left front wall of thorax, initial encounter: Secondary | ICD-10-CM

## 2020-12-26 DIAGNOSIS — Z8546 Personal history of malignant neoplasm of prostate: Secondary | ICD-10-CM

## 2020-12-26 DIAGNOSIS — S0990XA Unspecified injury of head, initial encounter: Secondary | ICD-10-CM | POA: Diagnosis not present

## 2020-12-26 DIAGNOSIS — C3492 Malignant neoplasm of unspecified part of left bronchus or lung: Secondary | ICD-10-CM | POA: Diagnosis not present

## 2020-12-26 DIAGNOSIS — E785 Hyperlipidemia, unspecified: Secondary | ICD-10-CM | POA: Diagnosis present

## 2020-12-26 DIAGNOSIS — Z7901 Long term (current) use of anticoagulants: Secondary | ICD-10-CM

## 2020-12-26 DIAGNOSIS — I700 Atherosclerosis of aorta: Secondary | ICD-10-CM | POA: Diagnosis present

## 2020-12-26 DIAGNOSIS — R739 Hyperglycemia, unspecified: Secondary | ICD-10-CM | POA: Diagnosis present

## 2020-12-26 DIAGNOSIS — S299XXA Unspecified injury of thorax, initial encounter: Secondary | ICD-10-CM | POA: Diagnosis not present

## 2020-12-26 DIAGNOSIS — R040 Epistaxis: Secondary | ICD-10-CM | POA: Diagnosis not present

## 2020-12-26 DIAGNOSIS — I499 Cardiac arrhythmia, unspecified: Secondary | ICD-10-CM | POA: Diagnosis not present

## 2020-12-26 DIAGNOSIS — C3412 Malignant neoplasm of upper lobe, left bronchus or lung: Secondary | ICD-10-CM | POA: Diagnosis present

## 2020-12-26 DIAGNOSIS — R55 Syncope and collapse: Secondary | ICD-10-CM

## 2020-12-26 DIAGNOSIS — Z5111 Encounter for antineoplastic chemotherapy: Secondary | ICD-10-CM

## 2020-12-26 DIAGNOSIS — Z83438 Family history of other disorder of lipoprotein metabolism and other lipidemia: Secondary | ICD-10-CM

## 2020-12-26 DIAGNOSIS — M2578 Osteophyte, vertebrae: Secondary | ICD-10-CM | POA: Diagnosis not present

## 2020-12-26 DIAGNOSIS — R000 Tachycardia, unspecified: Secondary | ICD-10-CM | POA: Diagnosis not present

## 2020-12-26 DIAGNOSIS — I10 Essential (primary) hypertension: Secondary | ICD-10-CM | POA: Diagnosis present

## 2020-12-26 DIAGNOSIS — I2699 Other pulmonary embolism without acute cor pulmonale: Secondary | ICD-10-CM | POA: Diagnosis present

## 2020-12-26 DIAGNOSIS — R9431 Abnormal electrocardiogram [ECG] [EKG]: Secondary | ICD-10-CM | POA: Diagnosis present

## 2020-12-26 DIAGNOSIS — T451X5A Adverse effect of antineoplastic and immunosuppressive drugs, initial encounter: Secondary | ICD-10-CM | POA: Diagnosis present

## 2020-12-26 DIAGNOSIS — J439 Emphysema, unspecified: Secondary | ICD-10-CM | POA: Diagnosis present

## 2020-12-26 DIAGNOSIS — R0781 Pleurodynia: Secondary | ICD-10-CM | POA: Diagnosis not present

## 2020-12-26 DIAGNOSIS — M47812 Spondylosis without myelopathy or radiculopathy, cervical region: Secondary | ICD-10-CM | POA: Diagnosis not present

## 2020-12-26 DIAGNOSIS — C7972 Secondary malignant neoplasm of left adrenal gland: Secondary | ICD-10-CM | POA: Diagnosis present

## 2020-12-26 DIAGNOSIS — J9601 Acute respiratory failure with hypoxia: Secondary | ICD-10-CM | POA: Diagnosis present

## 2020-12-26 DIAGNOSIS — C7971 Secondary malignant neoplasm of right adrenal gland: Secondary | ICD-10-CM | POA: Diagnosis present

## 2020-12-26 HISTORY — DX: Other pulmonary embolism without acute cor pulmonale: I26.99

## 2020-12-26 LAB — TROPONIN I (HIGH SENSITIVITY)
Troponin I (High Sensitivity): 7 ng/L (ref <18)
Troponin I (High Sensitivity): 6 ng/L (ref <18)

## 2020-12-26 LAB — URINALYSIS, ROUTINE W REFLEX MICROSCOPIC
Bilirubin Urine: NEGATIVE
Glucose, UA: NEGATIVE mg/dL
Hgb urine dipstick: NEGATIVE
Ketones, ur: NEGATIVE mg/dL
Leukocytes,Ua: NEGATIVE
Nitrite: NEGATIVE
Protein, ur: NEGATIVE mg/dL
Specific Gravity, Urine: 1.02 (ref 1.005–1.030)
pH: 6 (ref 5.0–8.0)

## 2020-12-26 LAB — BASIC METABOLIC PANEL
Anion gap: 10 (ref 5–15)
BUN: 54 mg/dL — ABNORMAL HIGH (ref 8–23)
CO2: 25 mmol/L (ref 22–32)
Calcium: 8.2 mg/dL — ABNORMAL LOW (ref 8.9–10.3)
Chloride: 100 mmol/L (ref 98–111)
Creatinine, Ser: 1.14 mg/dL (ref 0.61–1.24)
GFR, Estimated: >60 mL/min (ref >60)
Glucose, Bld: 189 mg/dL — ABNORMAL HIGH (ref 70–99)
Potassium: 3.3 mmol/L — ABNORMAL LOW (ref 3.5–5.1)
Sodium: 135 mmol/L (ref 135–145)

## 2020-12-26 LAB — CBC WITH DIFFERENTIAL/PLATELET
Abs Immature Granulocytes: 0.01 10*3/uL (ref 0.00–0.07)
Basophils Absolute: 0 10*3/uL (ref 0.0–0.1)
Basophils Relative: 0 %
Eosinophils Absolute: 0 10*3/uL (ref 0.0–0.5)
Eosinophils Relative: 0 %
HCT: 30.2 % — ABNORMAL LOW (ref 39.0–52.0)
Hemoglobin: 10.5 g/dL — ABNORMAL LOW (ref 13.0–17.0)
Immature Granulocytes: 0 %
Lymphocytes Relative: 18 %
Lymphs Abs: 0.5 10*3/uL — ABNORMAL LOW (ref 0.7–4.0)
MCH: 32.4 pg (ref 26.0–34.0)
MCHC: 34.8 g/dL (ref 30.0–36.0)
MCV: 93.2 fL (ref 80.0–100.0)
Monocytes Absolute: 0.1 10*3/uL (ref 0.1–1.0)
Monocytes Relative: 3 %
Neutro Abs: 2.1 10*3/uL (ref 1.7–7.7)
Neutrophils Relative %: 79 %
Platelets: 119 10*3/uL — ABNORMAL LOW (ref 150–400)
RBC: 3.24 MIL/uL — ABNORMAL LOW (ref 4.22–5.81)
RDW: 13.5 % (ref 11.5–15.5)
WBC: 2.6 10*3/uL — ABNORMAL LOW (ref 4.0–10.5)
nRBC: 0 % (ref 0.0–0.2)

## 2020-12-26 LAB — HEMOGLOBIN A1C
Hgb A1c MFr Bld: 6.1 % — ABNORMAL HIGH (ref 4.8–5.6)
Mean Plasma Glucose: 128.37 mg/dL

## 2020-12-26 LAB — LACTIC ACID, PLASMA: Lactic Acid, Venous: 1.1 mmol/L (ref 0.5–1.9)

## 2020-12-26 LAB — MAGNESIUM: Magnesium: 2 mg/dL (ref 1.7–2.4)

## 2020-12-26 LAB — HEPARIN LEVEL (UNFRACTIONATED): Heparin Unfractionated: 0.84 IU/mL — ABNORMAL HIGH (ref 0.30–0.70)

## 2020-12-26 LAB — APTT: aPTT: 28 seconds (ref 24–36)

## 2020-12-26 LAB — PHOSPHORUS: Phosphorus: 3.1 mg/dL (ref 2.5–4.6)

## 2020-12-26 LAB — CBG MONITORING, ED: Glucose-Capillary: 193 mg/dL — ABNORMAL HIGH (ref 70–99)

## 2020-12-26 MED ORDER — SODIUM CHLORIDE 0.9 % IV SOLN
1.0000 g | Freq: Every day | INTRAVENOUS | Status: AC
  Administered 2020-12-27 – 2020-12-31 (×5): 1 g via INTRAVENOUS
  Filled 2020-12-26 (×5): qty 10

## 2020-12-26 MED ORDER — DILTIAZEM HCL ER COATED BEADS 120 MG PO CP24
240.0000 mg | ORAL_CAPSULE | Freq: Every day | ORAL | Status: DC
  Administered 2020-12-26 – 2021-01-01 (×7): 240 mg via ORAL
  Filled 2020-12-26 (×7): qty 2

## 2020-12-26 MED ORDER — ONDANSETRON HCL 4 MG PO TABS: 4.0000 mg | ORAL_TABLET | Freq: Four times a day (QID) | ORAL | Status: DC | PRN

## 2020-12-26 MED ORDER — SODIUM CHLORIDE 0.9 % IV SOLN
1.0000 g | Freq: Once | INTRAVENOUS | Status: AC
  Administered 2020-12-26: 1 g via INTRAVENOUS
  Filled 2020-12-26: qty 10

## 2020-12-26 MED ORDER — PROCHLORPERAZINE MALEATE 10 MG PO TABS: 10.0000 mg | ORAL_TABLET | Freq: Four times a day (QID) | ORAL | Status: DC | PRN

## 2020-12-26 MED ORDER — HEPARIN (PORCINE) 25000 UT/250ML-% IV SOLN
1400.0000 [IU]/h | INTRAVENOUS | Status: DC
  Administered 2020-12-26: 16:00:00 1300 [IU]/h via INTRAVENOUS
  Administered 2020-12-27: 1400 [IU]/h via INTRAVENOUS
  Filled 2020-12-26 (×3): qty 250

## 2020-12-26 MED ORDER — SODIUM CHLORIDE 0.9 % IV SOLN: INTRAVENOUS | Status: DC

## 2020-12-26 MED ORDER — ONDANSETRON HCL 4 MG/2ML IJ SOLN
4.0000 mg | Freq: Once | INTRAMUSCULAR | Status: AC
  Administered 2020-12-26: 4 mg via INTRAVENOUS
  Filled 2020-12-26: qty 2

## 2020-12-26 MED ORDER — SODIUM CHLORIDE 0.9 % IV SOLN
500.0000 mg | INTRAVENOUS | Status: DC
  Administered 2020-12-26 – 2020-12-28 (×3): 500 mg via INTRAVENOUS
  Filled 2020-12-26 (×3): qty 500

## 2020-12-26 MED ORDER — FOLIC ACID 1 MG PO TABS
1.0000 mg | ORAL_TABLET | Freq: Every day | ORAL | Status: DC
  Administered 2020-12-26 – 2021-01-01 (×7): 1 mg via ORAL
  Filled 2020-12-26 (×7): qty 1

## 2020-12-26 MED ORDER — POTASSIUM CHLORIDE CRYS ER 20 MEQ PO TBCR
40.0000 meq | EXTENDED_RELEASE_TABLET | Freq: Once | ORAL | Status: AC
  Administered 2020-12-26: 40 meq via ORAL
  Filled 2020-12-26: qty 2

## 2020-12-26 MED ORDER — BOOST HIGH PROTEIN PO LIQD
1.0000 | Freq: Two times a day (BID) | ORAL | Status: DC
  Administered 2020-12-26: 237 mL via ORAL
  Filled 2020-12-26 (×2): qty 237

## 2020-12-26 MED ORDER — IOHEXOL 350 MG/ML SOLN
60.0000 mL | Freq: Once | INTRAVENOUS | Status: AC | PRN
  Administered 2020-12-26: 60 mL via INTRAVENOUS

## 2020-12-26 MED ORDER — HEPARIN BOLUS VIA INFUSION
2500.0000 [IU] | Freq: Once | INTRAVENOUS | Status: AC
  Administered 2020-12-26: 2500 [IU] via INTRAVENOUS
  Filled 2020-12-26: qty 2500

## 2020-12-26 MED ORDER — SODIUM CHLORIDE 0.9 % IV BOLUS
1000.0000 mL | Freq: Once | INTRAVENOUS | Status: AC
  Administered 2020-12-26: 1000 mL via INTRAVENOUS

## 2020-12-26 MED ORDER — PANTOPRAZOLE SODIUM 40 MG PO TBEC
40.0000 mg | DELAYED_RELEASE_TABLET | Freq: Every day | ORAL | Status: DC
  Administered 2020-12-26 – 2021-01-01 (×7): 40 mg via ORAL
  Filled 2020-12-26 (×7): qty 1

## 2020-12-26 MED ORDER — ACETAMINOPHEN 650 MG RE SUPP: 650.0000 mg | Freq: Four times a day (QID) | RECTAL | Status: DC | PRN

## 2020-12-26 MED ORDER — ACETAMINOPHEN 325 MG PO TABS: 650.0000 mg | ORAL_TABLET | Freq: Four times a day (QID) | ORAL | Status: DC | PRN

## 2020-12-26 MED ORDER — DM-GUAIFENESIN ER 30-600 MG PO TB12: 1.0000 | ORAL_TABLET | Freq: Two times a day (BID) | ORAL | Status: DC | PRN

## 2020-12-26 MED ORDER — HYDROMORPHONE HCL 1 MG/ML IJ SOLN
1.0000 mg | Freq: Once | INTRAMUSCULAR | Status: AC
  Administered 2020-12-26: 1 mg via INTRAVENOUS
  Filled 2020-12-26: qty 1

## 2020-12-26 MED ORDER — MORPHINE SULFATE (PF) 4 MG/ML IV SOLN
4.0000 mg | Freq: Once | INTRAVENOUS | Status: AC
  Administered 2020-12-26: 4 mg via INTRAVENOUS
  Filled 2020-12-26: qty 1

## 2020-12-26 MED ORDER — SODIUM CHLORIDE 0.9 % IV BOLUS: 1000.0000 mL | Freq: Once | INTRAVENOUS | Status: DC

## 2020-12-26 MED ORDER — ONDANSETRON HCL 4 MG/2ML IJ SOLN: 4.0000 mg | Freq: Four times a day (QID) | INTRAMUSCULAR | Status: DC | PRN

## 2020-12-26 MED ORDER — MAGNESIUM SULFATE 2 GM/50ML IV SOLN
2.0000 g | Freq: Once | INTRAVENOUS | Status: AC
  Administered 2020-12-26: 2 g via INTRAVENOUS
  Filled 2020-12-26: qty 50

## 2020-12-26 NOTE — Progress Notes (Signed)
ANTICOAGULATION CONSULT NOTE - Initial Consult  Pharmacy Consult for heparin Indication: pulmonary embolus  Allergies  Allergen Reactions   Penicillins Hives    Patient Measurements:   Heparin Dosing Weight: 78.7 kg  Vital Signs: Temp: 97.4 F (36.3 C) (12/02 1123) Temp Source: Oral (12/02 1123) BP: 115/92 (12/02 1250) Pulse Rate: 104 (12/02 1123)  Labs: Recent Labs    12/24/20 1200 12/25/20 1555 12/26/20 1120  HGB 12.2* 11.3* 10.5*  HCT 35.1* 33.1* 30.2*  PLT 199 163 119*  CREATININE 1.15 1.47* 1.14  TROPONINIHS  --   --  7    Estimated Creatinine Clearance: 53.4 mL/min (by C-G formula based on SCr of 1.14 mg/dL).   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 Pharmacy consulted to dose heparin.  Pt on apixaban 5 mg po bid PTA for AFib. Last dose apixaban 12/1 PM per EDP.  Hg 10.5, PLT 119 - low. SCr 1.14  Goal of Therapy:  Heparin level 0.3-0.7 units/ml aPTT 66-102 seconds Monitor platelets by anticoagulation protocol: Yes   Plan:  Draw baseline aPTT & heparin level now Will dose with aPTT due to pt on apixaban PTA Heparin bolus 2500 units then heparin drip 1300 units/hr Check 8hr aPTT Daily aPTT, heparin level and CBC  Eudelia Bunch, Pharm.D 12/26/2020 2:12 PM

## 2020-12-26 NOTE — ED Triage Notes (Addendum)
Patient BIBA from home. Had unwitnessed syncopal episode and fall. Patient complains of L rib pain, and difficulty taking deep breaths. AOX4.  Given 29mcg Fentanyl by EMS  HX Afib

## 2020-12-26 NOTE — ED Notes (Signed)
Antibiotics started before blood cultures could be pulled

## 2020-12-26 NOTE — ED Provider Notes (Signed)
[facility] DEPT Provider Note   CSN: [id number] Arrival date & time: 12/26/20  1113     History Chief Complaint  Patient presents with   Fall    Rib pain   Loss of Consciousness    Joe Hill is a 80 y.o. male.  Pt presents to the ED today with a syncopal episode.  Pt remembers walking to the bathroom and then remembers waking up on the floor.  Pt does not remember feeling hot or having cp.  He hit the left side of his chest.  He has a lot of pain in his ribs.  He did hit his head and is on Eliquis for hx afib.  Pt was here on 11/30 for afib with rvr and pna.  He felt like he was ok yesterday.      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   Aortic atherosclerosis ([place]) 10/28/2020   Dyslipidemia 10/23/2016   Abnormal CT scan, chest 09/07/2010   EMPHYSEMA 10/16/2009   GERD 09/09/2009   Hyperglycemia 08/12/2009   Essential hypertension 08/04/2007   TOBACCO USE 01/30/2007   PROSTATE CANCER, 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/11/2006    Past Surgical History:  Procedure Laterality Date   APPENDECTOMY     CATARACT EXTRACTION     PROSTATE SURGERY     prostatectomy   VIDEO BRONCHOSCOPY WITH ENDOBRONCHIAL NAVIGATION N/A 10/30/2020   Procedure: VIDEO BRONCHOSCOPY WITH ENDOBRONCHIAL NAVIGATION;  Surgeon: Melrose Nakayama, MD;  Location: [facility] OR;  Service: Thoracic;  Laterality: N/A;    VIDEO BRONCHOSCOPY WITH ENDOBRONCHIAL ULTRASOUND N/A 10/30/2020   Procedure: VIDEO BRONCHOSCOPY WITH ENDOBRONCHIAL ULTRASOUND;  Surgeon: Melrose Nakayama, MD;  Location: [facility] OR;  Service: Thoracic;  Laterality: N/A;       Family History  Problem Relation Age of Onset   Hyperlipidemia Mother    Cancer Father        lung, smoker   Cancer Sister        breast, smoker   Arthritis Maternal Aunt     Social History   Tobacco Use   Smoking status: Every Day    Packs/day: 0.25    Types: Cigarettes   Smokeless tobacco: Never  Vaping Use   Vaping Use: Never used  Substance Use Topics   Alcohol use: No    Alcohol/week: 0.0 standard drinks   Drug use: No    Home Medications Prior to Admission medications   Medication Sig Start Date End Date Taking? Authorizing Provider  albuterol (VENTOLIN HFA) 108 (90 Base) MCG/ACT inhaler Inhale 2 puffs into the lungs every 6 (six) hours as needed for wheezing. 12/15/20   [provider]  apixaban (ELIQUIS) 5 MG TABS tablet Take 1 tablet (5 mg total) by mouth 2 (two) times daily. Start Saturday night with the pm dose. 11/01/20   Barrett, Evelene Croon, PA-C  azithromycin (ZITHROMAX) 250 MG tablet Take 1 tablet (250 mg total)  by mouth daily. Take first 2 tablets together, then 1 every day until finished. 12/24/20   Fredia Sorrow, MD  dextromethorphan-guaiFENesin [facility] DM) 30-600 MG 12hr tablet Take 1 tablet by mouth 2 (two) times daily as needed for cough.    [provider]  diltiazem (CARDIZEM CD) 120 MG 24 hr capsule Take 2 capsules (240 mg total) by mouth daily. 12/24/20   Fredia Sorrow, MD  diltiazem (CARDIZEM CD) 180 MG 24 hr capsule Take 1 capsule (180 mg total) by mouth daily. 10/30/20 10/30/21  Barrett, Evelene Croon, PA-C  feeding supplement (BOOST HIGH PROTEIN) LIQD Take 1 Container by mouth 2 (two) times daily.    [provider]  folic acid (FOLVITE) 1 MG tablet Take 1 tablet (1 mg total) by mouth daily. 11/18/20    Curt Bears, MD  Nutritional Supplements (ENSURE COMPLETE PO) Take 1 Bottle by mouth daily.    [provider]  potassium chloride SA (KLOR-CON) 20 MEQ tablet Take 1 tablet (20 mEq total) by mouth daily. Take 2 tablets (49meq) for 2 days then take 1 tablet (49meq) daily Patient not taking: Reported on 12/17/2020 11/17/20   Shirley Friar, PA-C  prochlorperazine (COMPAZINE) 10 MG tablet Take 1 tablet (10 mg total) by mouth every 6 (six) hours as needed for nausea or vomiting. 11/18/20   Curt Bears, MD    Allergies    Penicillins  Review of Systems   Review of Systems  Musculoskeletal:        Left rib pain  Neurological:  Positive for syncope.   Physical Exam Updated Vital Signs BP (!) 115/92   Pulse (!) 104   Temp (!) 97.4 F (36.3 C) (Oral)   Resp 13   SpO2 99%   Physical Exam Vitals and nursing note reviewed.  Constitutional:      Appearance: Normal appearance.  HENT:     Head: Normocephalic and atraumatic.     Right Ear: External ear normal.     Left Ear: External ear normal.     Nose: Nose normal.     Mouth/Throat:     Mouth: Mucous membranes are moist.     Pharynx: Oropharynx is clear.  Eyes:     Extraocular Movements: Extraocular movements intact.     Conjunctiva/sclera: Conjunctivae normal.     Pupils: Pupils are equal, round, and reactive to light.  Cardiovascular:     Rate and Rhythm: Normal rate. Rhythm irregular.     Pulses: Normal pulses.     Heart sounds: Normal heart sounds.  Pulmonary:     Effort: Pulmonary effort is normal.     Breath sounds: Normal breath sounds.  Chest:    Abdominal:     General: Abdomen is flat. Bowel sounds are normal.     Palpations: Abdomen is soft.  Musculoskeletal:        General: Normal range of motion.     Cervical back: Normal range of motion and neck supple.  Skin:    General: Skin is warm.     Capillary Refill: Capillary refill takes less than 2 seconds.  Neurological:     General:  No focal deficit present.     Mental Status: He is alert and oriented to person, place, and time.  Psychiatric:        Mood and Affect: Mood normal.        Behavior: Behavior normal.    ED Results / Procedures / Treatments   Labs (all labs ordered are listed, but  only abnormal results are displayed) Labs Reviewed  BASIC METABOLIC PANEL - Abnormal; Notable for the following components:      Result Value   Potassium 3.3 (*)    Glucose, Bld 189 (*)    BUN 54 (*)    Calcium 8.2 (*)    All other components within normal limits  CBC WITH DIFFERENTIAL/PLATELET - Abnormal; Notable for the following components:   WBC 2.6 (*)    RBC 3.24 (*)    Hemoglobin 10.5 (*)    HCT 30.2 (*)    Platelets 119 (*)    Lymphs Abs 0.5 (*)    All other components within normal limits  URINALYSIS, ROUTINE W REFLEX MICROSCOPIC - Abnormal; Notable for the following components:   Bacteria, UA RARE (*)    All other components within normal limits  CBG MONITORING, ED - Abnormal; Notable for the following components:   Glucose-Capillary 193 (*)    All other components within normal limits  CULTURE, BLOOD (ROUTINE X 2)  CULTURE, BLOOD (ROUTINE X 2)  LACTIC ACID, PLASMA  LACTIC ACID, PLASMA  APTT  HEPARIN LEVEL (UNFRACTIONATED)  TROPONIN I (HIGH SENSITIVITY)  TROPONIN I (HIGH SENSITIVITY)    EKG EKG Interpretation  Date/Time:  Friday December 26 2020 11:20:37 EST Ventricular Rate:  96 PR Interval:    QRS Duration: 80 QT Interval:  376 QTC Calculation: 476 R Axis:   21 Text Interpretation: Atrial fibrillation Ventricular premature complex Abnormal R-wave progression, early transition Nonspecific T abnrm, anterolateral leads Borderline prolonged QT interval Baseline wander in lead(s) V2 V3 V6 Since last tracing rate slower Confirmed by Isla Pence [phone]) on 12/26/2020 12:03:52 PM  Radiology DG Ribs Unilateral W/Chest Left  Result Date: 12/26/2020 CLINICAL DATA:  Fall, left anterior rib pain  EXAM: LEFT RIBS AND CHEST - 3+ VIEW COMPARISON:  Chest radiographs and CT chest, 12/24/2020 FINDINGS: No displaced fracture or other bone lesions are seen involving the ribs. There is no evidence of pneumothorax or pleural effusion. Heterogeneous airspace opacity of the left lung base. Unchanged elevation of the left hemidiaphragm. Heart size and mediastinal contours are within normal limits. IMPRESSION: 1. No displaced rib fracture. 2. Heterogeneous airspace opacity of the left lung base, consistent with infection or aspiration, as seen on prior. Electronically Signed   By: Delanna Ahmadi M.D.   On: 12/26/2020 12:14   CT Head Wo Contrast  Result Date: 12/26/2020 CLINICAL DATA:  Neck trauma. Head trauma, minor. Additional history provided: Unwitnessed syncopal episode and fall. Patient reports left rib pain, difficulty taking deep breaths. EXAM: CT HEAD WITHOUT CONTRAST CT CERVICAL SPINE WITHOUT CONTRAST TECHNIQUE: Multidetector CT imaging of the head and cervical spine was performed following the standard protocol without intravenous contrast. Multiplanar CT image reconstructions of the cervical spine were also generated. COMPARISON:  Brain MRI 10/09/2020. FINDINGS: CT HEAD FINDINGS Brain: Mild-to-moderate generalized cerebral atrophy. Comparatively mild cerebellar atrophy. 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 Background moderate/advanced patchy and ill-defined hypoattenuation within the cerebral white matter, nonspecific but compatible with chronic small vessel ischemic disease. There is no acute intracranial hemorrhage. No demarcated cortical infarct. No extra-axial fluid collection. No evidence of an intracranial mass. No midline shift. Vascular: No hyperdense vessel.  Atherosclerotic calcifications. Skull: Normal. Negative for fracture or focal lesion.  Sinuses/Orbits: Visualized orbits show no acute finding. Mild mucosal thickening within the bilateral ethmoid and  maxillary sinuses at the imaged levels. CT CERVICAL SPINE FINDINGS Alignment: Straightening of the expected cervical lordosis. No significant spondylolisthesis. Skull base and vertebrae: The basion-dental and atlanto-dental intervals are maintained.No evidence of acute fracture to the cervical spine. Soft tissues and spinal canal: No prevertebral fluid or swelling. No visible canal hematoma. Disc levels: Cervical spondylosis with multilevel disc space narrowing, disc bulges, posterior disc osteophytes, endplate spurring, uncovertebral hypertrophy and facet arthrosis. Disc space narrowing is greatest at C5-C6, C6-C7 and T2-T3 (moderate at these levels). No appreciable levels of severe spinal canal stenosis. Multilevel bony neural foraminal narrowing. Multilevel ventral osteophytes. Upper chest: No consolidation within the imaged lung apices. No visible pneumothorax. Centrilobular and paraseptal emphysema with biapical bulla. Biapical pleuroparenchymal scarring. IMPRESSION: CT head: 1. No evidence of acute intracranial abnormality. 2. 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 3. Mild paranasal sinus disease at the imaged levels. CT cervical spine: 1. No evidence of acute fracture to the cervical spine. 2. Straightening of the expected cervical lordosis. 3. Cervical spondylosis, as described. 4. Emphysema (ICD10-J43.9). Associated biapical pleuroparenchymal scarring and biapical bulla. Electronically Signed   By: Kellie Simmering D.O.   On: 12/26/2020 13:33   CT Chest W Contrast  Result Date: 12/26/2020 CLINICAL DATA:  History of trauma, unwitnessed syncopal episode in an 80 year old male with history of pulmonary neoplasm. EXAM: CT CHEST WITH CONTRAST TECHNIQUE: Multidetector CT imaging of the  chest was performed during intravenous contrast administration. CONTRAST:  34mL OMNIPAQUE IOHEXOL 350 MG/ML SOLN COMPARISON:  December 24, 2020 chest CT. FINDINGS: Cardiovascular: The aorta is normal caliber. Central pulmonary vasculature is normal caliber. Filling defects in the RIGHT lower lobe segmental and subsegmental branches to posterior basal segment (image 126/3) this is in an area of beam hardening artifact. The study is a venous phase evaluation not performed for PE assessment. No additional areas are demonstrated. Heart size is normal without pericardial effusion. There is straightening of the interventricular septum that was present previously. Mediastinum/Nodes: Interval decrease in size of proximally 2 cm LEFT paramediastinal mass contiguous with AP window soft tissue is stable in the short interval. Small lymph nodes throughout the remainder of the mediastinum likewise are unchanged. Lungs/Pleura: LEFT lower lobe nodularity and airspace process showing no change in the short interval. Signs of pulmonary emphysema and LEFT upper lobe nodule as discussed. Nodular changes have developed in the RIGHT lower lobe, multifocal nodularity since the previous study. Stable background subsolid nodule (image 119/6) 13 x 15 mm, this was not present in August of 2022 nor in September and is therefore favored to represent post inflammatory process. There is mild septal thickening in the RIGHT upper lobe that is new from recent imaging. Mild bronchial wall thickening is present throughout the chest. Upper Abdomen: Incidental imaging of upper abdominal contents without acute process. 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 No acute upper abdominal process. Musculoskeletal: Signs of subacute sternal fracture, sclerotic margins without change, no substantial displacement. Spinal degenerative changes. Costochondral elements  are intact. No signs of displaced rib fracture. Visualized clavicles and scapulae are intact. IMPRESSION: Small RIGHT lower lobe pulmonary emboli. Straightening of the interventricular septum is a stable finding and may relate to baseline elevated RIGHT heart pressures. Echocardiographic correlation may be helpful as warranted. Worsening of multifocal pneumonia. Signs of primary lung cancer in the LEFT chest  with mediastinal involvement as described previously. 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 Sternal fracture favored to be subacute is new since September of 2022. Not substantially changed since December 24, 2020. Aortic Atherosclerosis (ICD10-I70.0) and Emphysema (ICD10-J43.9). Critical Value/emergent results were called by telephone at the time of interpretation on 12/26/2020 at 1:42 pm to provider [facility] , who verbally acknowledged these results. Electronically Signed   By: Zetta Bills M.D.   On: 12/26/2020 13:43   CT Angio Chest PE W and/or Wo Contrast  Result Date: 12/24/2020 CLINICAL DATA:  Difficulty breathing.  History of lung cancer. EXAM: CT ANGIOGRAPHY CHEST WITH CONTRAST TECHNIQUE: Multidetector CT imaging of the chest was performed using the standard protocol during bolus administration of intravenous contrast. Multiplanar CT image reconstructions and MIPs were obtained to evaluate the vascular anatomy. CONTRAST:  104mL OMNIPAQUE IOHEXOL 350 MG/ML SOLN COMPARISON:  October 15, 2020.  October 15, 2013. FINDINGS: Cardiovascular: Satisfactory opacification of the pulmonary arteries to the segmental level. No evidence of pulmonary embolism. Normal heart size. No pericardial effusion. Mild coronary artery calcifications are noted. Mediastinum/Nodes: Thyroid gland and esophagus are unremarkable. 4.7 x 2.2 cm adenopathy is noted in aortopulmonary window. Lungs/Pleura: Emphysematous disease is noted. No pneumothorax or pleural effusion is noted. Patchy  airspace opacities are noted in the left lower lobe concerning for pneumonia. 2.2 x 2.0 cm mass is noted anteriorly in the left upper lobe which does not appear to be significantly changed compared to prior exam and is concerning for malignancy. Several small ill-defined opacities are noted in the right lower lobe which may represent focal inflammation. Upper Abdomen: No acute abnormality. Musculoskeletal: No chest wall abnormality. No acute or significant osseous findings. Review of the MIP images confirms the above findings. IMPRESSION: No definite evidence of pulmonary embolus. New patchy airspace opacities are noted in the left lower lobe and to a lesser degree in the right lower lobe, concerning for multifocal pneumonia. 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 Aortic Atherosclerosis (ICD10-I70.0) and Emphysema (ICD10-J43.9). Electronically Signed   By: Marijo Conception M.D.   On: 12/24/2020 16:50   CT Cervical Spine Wo Contrast  Result Date: 12/26/2020 CLINICAL DATA:  Neck trauma. Head trauma, minor. Additional history provided: Unwitnessed syncopal episode and fall. Patient reports left rib pain, difficulty taking deep breaths. EXAM: CT HEAD WITHOUT CONTRAST CT CERVICAL SPINE WITHOUT CONTRAST TECHNIQUE: Multidetector CT imaging of the head and cervical spine was performed following the standard protocol without intravenous contrast. Multiplanar CT image reconstructions of the cervical spine were also generated. COMPARISON:  Brain MRI 10/09/2020. FINDINGS: CT HEAD FINDINGS Brain: Mild-to-moderate generalized cerebral atrophy. Comparatively mild cerebellar atrophy. 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 Background  moderate/advanced patchy and ill-defined hypoattenuation within the cerebral white matter, nonspecific but compatible with chronic small vessel ischemic disease. There is no acute intracranial hemorrhage. No demarcated cortical infarct. No extra-axial fluid collection. No evidence of an intracranial mass. No midline shift. Vascular: No hyperdense vessel.  Atherosclerotic calcifications. Skull: Normal. Negative for fracture or focal lesion. Sinuses/Orbits: Visualized orbits show no acute finding. Mild mucosal thickening within the bilateral ethmoid and maxillary sinuses at the imaged levels. CT CERVICAL SPINE FINDINGS Alignment: Straightening of the expected cervical  lordosis. No significant spondylolisthesis. Skull base and vertebrae: The basion-dental and atlanto-dental intervals are maintained.No evidence of acute fracture to the cervical spine. Soft tissues and spinal canal: No prevertebral fluid or swelling. No visible canal hematoma. Disc levels: Cervical spondylosis with multilevel disc space narrowing, disc bulges, posterior disc osteophytes, endplate spurring, uncovertebral hypertrophy and facet arthrosis. Disc space narrowing is greatest at C5-C6, C6-C7 and T2-T3 (moderate at these levels). No appreciable levels of severe spinal canal stenosis. Multilevel bony neural foraminal narrowing. Multilevel ventral osteophytes. Upper chest: No consolidation within the imaged lung apices. No visible pneumothorax. Centrilobular and paraseptal emphysema with biapical bulla. Biapical pleuroparenchymal scarring. IMPRESSION: CT head: 1. No evidence of acute intracranial abnormality. 2. 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 3. Mild paranasal sinus disease at the imaged levels. CT cervical spine: 1. No evidence of acute fracture to the cervical spine. 2. Straightening of the expected cervical lordosis. 3.  Cervical spondylosis, as described. 4. Emphysema (ICD10-J43.9). Associated biapical pleuroparenchymal scarring and biapical bulla. Electronically Signed   By: Kellie Simmering D.O.   On: 12/26/2020 13:33    Procedures Procedures   Medications Ordered in ED Medications  sodium chloride 0.9 % bolus 1,000 mL (1,000 mLs Intravenous New Bag/Given 12/26/20 1141)    And  0.9 %  sodium chloride infusion (has no administration in time range)  sodium chloride 0.9 % bolus 1,000 mL (has no administration in time range)  cefTRIAXone (ROCEPHIN) 1 g in sodium chloride 0.9 % 100 mL IVPB (1 g Intravenous New Bag/Given 12/26/20 1402)  azithromycin (ZITHROMAX) 500 mg in sodium chloride 0.9 % 250 mL IVPB (has no administration in time range)  cefTRIAXone (ROCEPHIN) 1 g in sodium chloride 0.9 % 100 mL IVPB (has no administration in time range)  sodium chloride 0.9 % bolus 1,000 mL (has no administration in time range)  morphine 4 MG/ML injection 4 mg (4 mg Intravenous Given 12/26/20 1142)  ondansetron (ZOFRAN) injection 4 mg (4 mg Intravenous Given 12/26/20 1141)  HYDROmorphone (DILAUDID) injection 1 mg (1 mg Intravenous Given 12/26/20 1245)  iohexol (OMNIPAQUE) 350 MG/ML injection 60 mL (60 mLs Intravenous Contrast Given 12/26/20 1254)    ED Course  I have reviewed the triage vital signs and the nursing notes.  Pertinent labs & imaging results that were available during my care of the patient were reviewed by me and considered in my medical decision making (see chart for details).    MDM Rules/Calculators/A&P                           Pt is having a lot of pain, so he is given morphine and dilaudid.  Pt has a PE on Eliquis.  Pt has worsening pneumonia.  Pt started on heparin and rocephin/zithromax.    Covid/flu neg on 11/29.  I will repeat it today.  Pt d/w Dr. Olevia Bowens ([facility]ad) who will admit.  CRITICAL CARE Performed by: Isla Pence   Total critical care time: 30 minutes  Critical care time was  exclusive of separately billable procedures and treating other patients.  Critical care was necessary to treat or prevent imminent or life-threatening deterioration.  Critical care was time spent personally by me on the following activities: development of treatment plan with patient and/or surrogate as well as nursing, discussions with consultants, evaluation of patient's response to treatment, examination of patient, obtaining history from  patient or surrogate, ordering and performing treatments and interventions, ordering and review of laboratory studies, ordering and review of radiographic studies, pulse oximetry and re-evaluation of patient's condition.   Final Clinical Impression(s) / ED Diagnoses Final diagnoses:  Syncope, unspecified syncope type  Multiple subsegmental pulmonary emboli without acute cor pulmonale (HCC)  Multifocal pneumonia  Atrial fibrillation with RVR (HCC)  On apixaban therapy  Contusion of rib on left side, initial encounter  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[place])  Pancytopenia ([place])    Rx / DC Orders ED Discharge Orders     None        Isla Pence, MD 12/26/20 1407

## 2020-12-26 NOTE — H&P (Addendum)
History and Physical    Joe Hill LZJ:[id number] DOB: 1940-08-31 DOA: 12/26/2020  PCP: [name], MD   Patient coming from: Home.  I have personally briefly reviewed patient's old medical records in [place]  Chief Complaint: Fall.  HPI: Joe Hill is a 80 y.o. male with medical history significant of bilateral adrenal mets, colon polyps, COPD/emphysema, GERD, hyperglycemia, hypertension, tobacco use, prostate cancer who was brought to the emergency department via EMS after having an unwitnessed syncopal episode and collapse at home hitting his left rib cage.  The patient does not remember having any prodromal symptoms before passing out and just remembered being on the floor.  He has had night sweats, but no fever.  No rhinorrhea, sore throat but continues to have productive cough with occasional yellowish sputum and mild wheezing.  No hemoptysis.  No chest pain, palpitations, diaphoresis, PND, orthopnea or pitting edema of the lower extremities.  He had an episode of diarrhea 2 days ago, but no abdominal pain, nausea, emesis, melena or hematochezia.  Denied flank pain, dysuria, frequency or hematuria.  ED Course: Initial vital signs were temperature 97.4 F, pulse 104, respiration 18, BP 102/67 mmHg O2 sat 100% on room air.  The patient received 2000 mL of NS bolus, hydromorphone 1 mg IVP, morphine 4 mg IVP, ondansetron 4 mg IVP, ceftriaxone 1 g and azithromycin 500 mg IVPB.  He was also started on heparin infusion.  Lab work: His urinalysis had rare bacteria but was otherwise normal.  CBC showed a white count of 2.6, hemoglobin 10.5 g/dL platelets 119.  BMP showed a potassium of 3.3 mmol/L.  Glucose 189, BUN 54, creatinine 1.14 and calcium 8.2 mg/dL.  Imaging: CT head did not show any evidence of intracranial abnormality.  CT cervical spine did not show any acute fracture of the cervical spine. CTA chest shows small right lower lobe pulmonary emboli.  There has been  worsening of multifocal pneumonia.  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  Left adrenal metastasis not well assess better seen on prior PET scan.  Sternal fracture favored to be subacute.  There was aortic atherosclerosis and emphysema.  Please see images and full radiology report for further details.  Review of Systems: As per HPI otherwise all other systems reviewed and are negative.  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   TOBACCO USE 01/30/2007   Past Surgical History:  Procedure Laterality Date   APPENDECTOMY     CATARACT EXTRACTION     PROSTATE SURGERY     prostatectomy   VIDEO BRONCHOSCOPY WITH ENDOBRONCHIAL NAVIGATION N/A 10/30/2020   Procedure: VIDEO BRONCHOSCOPY WITH ENDOBRONCHIAL NAVIGATION;  Surgeon: Melrose Nakayama, MD;  Location: [facility];  Service: Thoracic;  Laterality: N/A;   VIDEO BRONCHOSCOPY WITH ENDOBRONCHIAL ULTRASOUND N/A 10/30/2020   Procedure: VIDEO BRONCHOSCOPY WITH ENDOBRONCHIAL ULTRASOUND;  Surgeon: Melrose Nakayama, MD;  Location: [facility];  Service: Thoracic;  Laterality: N/A;   Social History  reports that he has been smoking cigarettes. He has been smoking an average of .25 packs per day. He has never used smokeless tobacco. He reports that he does not drink alcohol and does not use drugs.  Allergies  Allergen Reactions   Penicillins Hives   Family History  Problem Relation Age of Onset   Hyperlipidemia Mother    Cancer Father        lung, smoker   Cancer Sister        breast, smoker   Arthritis Maternal Aunt    Prior to Admission medications   Medication Sig Start Date End Date Taking? Authorizing Provider  albuterol  (VENTOLIN HFA) 108 (90 Base) MCG/ACT inhaler Inhale 2 puffs into the lungs every 6 (six) hours as needed for wheezing. 12/15/20   [provider]  apixaban (ELIQUIS) 5 MG TABS tablet Take 1 tablet (5 mg total) by mouth 2 (two) times daily. Start Saturday night with the pm dose. 11/01/20   Barrett, Evelene Croon, PA-C  azithromycin (ZITHROMAX) 250 MG tablet Take 1 tablet (250 mg total) by mouth daily. Take first 2 tablets together, then 1 every day until finished. 12/24/20   Fredia Sorrow, MD  [facility]an[facility] DM) 30-600 MG 12hr tablet Take 1 tablet by mouth 2 (two) times daily as needed for cough.    [provider]  diltiazem (CARDIZEM CD) 120 MG 24 hr capsule Take 2 capsules (240 mg total) by mouth daily. 12/24/20   Fredia Sorrow, MD  diltiazem (CARDIZEM CD) 180 MG 24 hr capsule Take 1 capsule (180 mg total) by mouth daily. 10/30/20 10/30/21  Barrett, Evelene Croon, PA-C  feeding supplement (BOOST HIGH PROTEIN) LIQD Take 1 Container by mouth 2 (two) times daily.    [provider]  folic acid (FOLVITE) 1 MG tablet Take 1 tablet (1 mg total) by mouth daily. 11/18/20   Curt Bears, MD  Nutritional Supplements (ENSURE COMPLETE PO) Take 1 Bottle by mouth daily.    [provider]  potassium chloride SA (KLOR-CON) 20 MEQ tablet Take 1 tablet (20 mEq total) by mouth daily. Take 2 tablets (62meq) for 2 days then take 1 tablet (30meq) daily Patient not taking: Reported on 12/17/2020 11/17/20   Shirley Friar, PA-C  prochlorperazine (COMPAZINE) 10 MG tablet Take 1 tablet (10 mg total) by mouth every 6 (six) hours as needed for nausea or vomiting. 11/18/20   Curt Bears, MD   Physical Exam: Vitals:   12/26/20 1123 12/26/20 1128 12/26/20 1250  BP: 102/67  (!) 115/92  Pulse: (!) 104    Resp: 18  13  Temp: (!) 97.4 F (36.3 C)    TempSrc: Oral    SpO2: 100% 100% 99%   Constitutional: NAD, calm, comfortable Eyes: PERRL, lids and  conjunctivae normal.  Injected sclera bilaterally. ENMT: Mucous membranes are moist, but lips look dry. Posterior pharynx clear of any exudate or lesions. Neck: normal, supple, no masses, no thyromegaly Respiratory: Decreased breath sounds bilaterally with scattered bilateral rhonchi.  No crackles. Normal respiratory effort. No accessory muscle use.  Cardiovascular: Irregularly irregular in the 110s and 120s, no murmurs / rubs / gallops. No extremity edema. 2+ pedal pulses. No carotid bruits.  Abdomen: No distention.  Bowel sounds positive.  Soft, no tenderness, no masses palpated. No hepatosplenomegaly. Musculoskeletal: Moderate generalized weakness.  No clubbing / cyanosis. Good ROM, no contractures. Normal muscle tone.  Skin: Multiple areas of ecchymosis particularly on upper extremities. Neurologic: CN 2-12 grossly intact. Sensation intact, DTR normal. Strength 5/5 in all 4.  Psychiatric: Normal judgment and insight. Alert and oriented x 3. Normal mood.   Labs on Admission: I have personally reviewed following labs and imaging studies  CBC: Recent Labs  Lab 12/24/20 1200 12/25/20 1555 12/26/20 1120  WBC 3.8* 5.3 2.6*  NEUTROABS 3.2 4.3 2.1  HGB 12.2* 11.3* 10.5*  HCT 35.1* 33.1* 30.2*  MCV 93.6 92.5 93.2  PLT 199 163 242*   Basic Metabolic Panel: Recent Labs  Lab 12/24/20 1200 12/25/20 1555 12/26/20 1120  NA 137 141 135  K 4.0 3.9 3.3*  CL 102 104 100  CO2 25 26 25   GLUCOSE 125* 118* 189*  BUN 47* 53* 54*  CREATININE 1.15 1.47* 1.14  CALCIUM 8.9 8.7* 8.2*   GFR: Estimated Creatinine Clearance: 53.4 mL/min (by C-G formula based on SCr of 1.14 mg/dL).  Liver Function Tests: Recent Labs  Lab 12/25/20 1555  AST 40  ALT 63*  ALKPHOS 121  BILITOT 1.2  PROT 6.7  ALBUMIN 3.5   Urine analysis:    Component Value Date/Time   COLORURINE YELLOW 12/26/2020 1121   APPEARANCEUR CLEAR 12/26/2020 1121   LABSPEC 1.020 12/26/2020 1121   PHURINE 6.0 12/26/2020 1121    GLUCOSEU NEGATIVE 12/26/2020 1121        HGBUR NEGATIVE 12/26/2020 1121   BILIRUBINUR NEGATIVE 12/26/2020 1121        KETONESUR NEGATIVE 12/26/2020 1121   PROTEINUR NEGATIVE 12/26/2020 1121             NITRITE NEGATIVE 12/26/2020 1121   LEUKOCYTESUR NEGATIVE 12/26/2020 1121   Radiological Exams on Admission: DG Ribs Unilateral W/Chest Left  Result Date: 12/26/2020 CLINICAL DATA:  Fall, left anterior rib pain EXAM: LEFT RIBS AND CHEST - 3+ VIEW COMPARISON:  Chest radiographs and CT chest, 12/24/2020 FINDINGS: No displaced fracture or other bone lesions are seen involving the ribs. There is no evidence of pneumothorax or pleural effusion. Heterogeneous airspace opacity of the left lung base. Unchanged elevation of the left hemidiaphragm. Heart size and mediastinal contours are within normal limits. IMPRESSION: 1. No displaced rib fracture. 2. Heterogeneous airspace opacity of the left lung base, consistent with infection or aspiration, as seen on prior. Electronically Signed   By: Delanna Ahmadi M.D.   On: 12/26/2020 12:14   CT Head Wo Contrast  Result Date: 12/26/2020 CLINICAL DATA:  Neck trauma. Head trauma, minor. Additional history provided: Unwitnessed syncopal episode and fall. Patient reports left rib pain, difficulty taking deep breaths. EXAM: CT HEAD WITHOUT CONTRAST CT CERVICAL SPINE WITHOUT CONTRAST TECHNIQUE: Multidetector CT imaging of the head and cervical spine was performed following the standard protocol without intravenous contrast. Multiplanar CT image reconstructions of the cervical spine were also generated. COMPARISON:  Brain MRI 10/09/2020. FINDINGS: CT HEAD FINDINGS Brain: Mild-to-moderate generalized cerebral atrophy. Comparatively mild cerebellar atrophy. 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 Background moderate/advanced  patchy and ill-defined hypoattenuation within the cerebral white matter, nonspecific but compatible with chronic small vessel ischemic disease. There is no acute intracranial hemorrhage. No demarcated cortical infarct. No extra-axial fluid collection. No evidence of an intracranial mass. No midline shift. Vascular: No hyperdense vessel.  Atherosclerotic calcifications. Skull: Normal. Negative for fracture or focal lesion. Sinuses/Orbits: Visualized orbits show no acute finding. Mild mucosal thickening within the bilateral ethmoid and maxillary sinuses at the imaged levels. CT CERVICAL SPINE FINDINGS Alignment: Straightening of the expected cervical lordosis. No significant spondylolisthesis. Skull base and vertebrae: The basion-dental and atlanto-dental intervals are maintained.No evidence of acute fracture to the cervical spine. Soft tissues and spinal canal: No prevertebral fluid  or swelling. No visible canal hematoma. Disc levels: Cervical spondylosis with multilevel disc space narrowing, disc bulges, posterior disc osteophytes, endplate spurring, uncovertebral hypertrophy and facet arthrosis. Disc space narrowing is greatest at C5-C6, C6-C7 and T2-T3 (moderate at these levels). No appreciable levels of severe spinal canal stenosis. Multilevel bony neural foraminal narrowing. Multilevel ventral osteophytes. Upper chest: No consolidation within the imaged lung apices. No visible pneumothorax. Centrilobular and paraseptal emphysema with biapical bulla. Biapical pleuroparenchymal scarring. IMPRESSION: CT head: 1. No evidence of acute intracranial abnormality. 2. 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 3. Mild paranasal sinus disease at the imaged levels. CT cervical spine: 1. No evidence of acute fracture to the cervical spine. 2. Straightening of the expected cervical lordosis. 3. Cervical spondylosis,  as described. 4. Emphysema (ICD10-J43.9). Associated biapical pleuroparenchymal scarring and biapical bulla. Electronically Signed   By: Kellie Simmering D.O.   On: 12/26/2020 13:33   CT Chest W Contrast  Result Date: 12/26/2020 CLINICAL DATA:  History of trauma, unwitnessed syncopal episode in an 80 year old male with history of pulmonary neoplasm. EXAM: CT CHEST WITH CONTRAST TECHNIQUE: Multidetector CT imaging of the chest was performed during intravenous contrast administration. CONTRAST:  69mL OMNIPAQUE IOHEXOL 350 MG/ML SOLN COMPARISON:  December 24, 2020 chest CT. FINDINGS: Cardiovascular: The aorta is normal caliber. Central pulmonary vasculature is normal caliber. Filling defects in the RIGHT lower lobe segmental and subsegmental branches to posterior basal segment (image 126/3) this is in an area of beam hardening artifact. The study is a venous phase evaluation not performed for PE assessment. No additional areas are demonstrated. Heart size is normal without pericardial effusion. There is straightening of the interventricular septum that was present previously. Mediastinum/Nodes: Interval decrease in size of proximally 2 cm LEFT paramediastinal mass contiguous with AP window soft tissue is stable in the short interval. Small lymph nodes throughout the remainder of the mediastinum likewise are unchanged. Lungs/Pleura: LEFT lower lobe nodularity and airspace process showing no change in the short interval. Signs of pulmonary emphysema and LEFT upper lobe nodule as discussed. Nodular changes have developed in the RIGHT lower lobe, multifocal nodularity since the previous study. Stable background subsolid nodule (image 119/6) 13 x 15 mm, this was not present in August of 2022 nor in September and is therefore favored to represent post inflammatory process. There is mild septal thickening in the RIGHT upper lobe that is new from recent imaging. Mild bronchial wall thickening is present throughout the chest.  Upper Abdomen: Incidental imaging of upper abdominal contents without acute process. 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 No acute upper abdominal process. Musculoskeletal: Signs of subacute sternal fracture, sclerotic margins without change, no substantial displacement. Spinal degenerative changes. Costochondral elements are intact. No signs of displaced rib fracture. Visualized clavicles and scapulae are intact. IMPRESSION: Small RIGHT lower lobe pulmonary emboli. Straightening of the interventricular septum is a stable finding and may relate to baseline elevated RIGHT heart pressures. Echocardiographic correlation may be helpful as warranted. Worsening of multifocal pneumonia. Signs of primary lung cancer in the LEFT chest with mediastinal involvement as described previously. 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 Sternal fracture favored to be subacute is new since September of 2022. Not substantially changed since December 24, 2020. Aortic Atherosclerosis (ICD10-I70.0) and Emphysema (ICD10-J43.9). Critical Value/emergent results were called  by telephone at the time of interpretation on 12/26/2020 at 1:42 pm to provider [facility] , who verbally acknowledged these results. Electronically Signed   By: Zetta Bills M.D.   On: 12/26/2020 13:43   CT Angio Chest PE W and/or Wo Contrast  Result Date: 12/24/2020 CLINICAL DATA:  Difficulty breathing.  History of lung cancer. EXAM: CT ANGIOGRAPHY CHEST WITH CONTRAST TECHNIQUE: Multidetector CT imaging of the chest was performed using the standard protocol during bolus administration of intravenous contrast. Multiplanar CT image reconstructions and MIPs were obtained to evaluate the vascular anatomy. CONTRAST:  93mL OMNIPAQUE IOHEXOL 350 MG/ML SOLN COMPARISON:  October 15, 2020.  October 15, 2013. FINDINGS: Cardiovascular:  Satisfactory opacification of the pulmonary arteries to the segmental level. No evidence of pulmonary embolism. Normal heart size. No pericardial effusion. Mild coronary artery calcifications are noted. Mediastinum/Nodes: Thyroid gland and esophagus are unremarkable. 4.7 x 2.2 cm adenopathy is noted in aortopulmonary window. Lungs/Pleura: Emphysematous disease is noted. No pneumothorax or pleural effusion is noted. Patchy airspace opacities are noted in the left lower lobe concerning for pneumonia. 2.2 x 2.0 cm mass is noted anteriorly in the left upper lobe which does not appear to be significantly changed compared to prior exam and is concerning for malignancy. Several small ill-defined opacities are noted in the right lower lobe which may represent focal inflammation. Upper Abdomen: No acute abnormality. Musculoskeletal: No chest wall abnormality. No acute or significant osseous findings. Review of the MIP images confirms the above findings. IMPRESSION: No definite evidence of pulmonary embolus. New patchy airspace opacities are noted in the left lower lobe and to a lesser degree in the right lower lobe, concerning for multifocal pneumonia. 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 Aortic Atherosclerosis (ICD10-I70.0) and Emphysema (ICD10-J43.9). Electronically Signed   By: Marijo Conception M.D.   On: 12/24/2020 16:50   CT Cervical Spine Wo Contrast  Result Date: 12/26/2020 CLINICAL DATA:  Neck trauma. Head trauma, minor. Additional history provided: Unwitnessed syncopal episode and fall. Patient reports left rib pain, difficulty taking deep breaths. EXAM: CT HEAD WITHOUT CONTRAST CT CERVICAL SPINE WITHOUT CONTRAST TECHNIQUE: Multidetector CT imaging of the head and cervical spine was performed following the standard protocol without intravenous contrast. Multiplanar CT image  reconstructions of the cervical spine were also generated. COMPARISON:  Brain MRI 10/09/2020. FINDINGS: CT HEAD FINDINGS Brain: Mild-to-moderate generalized cerebral atrophy. Comparatively mild cerebellar atrophy. 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 Background moderate/advanced patchy and ill-defined hypoattenuation within the cerebral white matter, nonspecific but compatible with chronic small vessel ischemic disease. There is no acute intracranial hemorrhage. No demarcated cortical infarct. No extra-axial fluid collection. No evidence of an intracranial mass. No midline shift. Vascular: No hyperdense vessel.  Atherosclerotic calcifications. Skull: Normal. Negative for fracture or focal lesion. Sinuses/Orbits: Visualized orbits show no acute finding. Mild mucosal thickening within the bilateral ethmoid and maxillary sinuses at the imaged levels. CT CERVICAL SPINE FINDINGS Alignment: Straightening of the expected cervical lordosis. No significant spondylolisthesis. Skull base and vertebrae: The basion-dental and atlanto-dental intervals are maintained.No evidence of acute fracture to the cervical spine. Soft tissues and spinal canal: No prevertebral fluid or swelling. No visible canal hematoma. Disc levels: Cervical spondylosis with multilevel disc space narrowing, disc  bulges, posterior disc osteophytes, endplate spurring, uncovertebral hypertrophy and facet arthrosis. Disc space narrowing is greatest at C5-C6, C6-C7 and T2-T3 (moderate at these levels). No appreciable levels of severe spinal canal stenosis. Multilevel bony neural foraminal narrowing. Multilevel ventral osteophytes. Upper chest: No consolidation within the imaged lung apices. No visible pneumothorax. Centrilobular and paraseptal emphysema with biapical bulla. Biapical pleuroparenchymal  scarring. IMPRESSION: CT head: 1. No evidence of acute intracranial abnormality. 2. 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 3. Mild paranasal sinus disease at the imaged levels. CT cervical spine: 1. No evidence of acute fracture to the cervical spine. 2. Straightening of the expected cervical lordosis. 3. Cervical spondylosis, as described. 4. Emphysema (ICD10-J43.9). Associated biapical pleuroparenchymal scarring and biapical bulla. Electronically Signed   By: Kellie Simmering D.O.   On: 12/26/2020 13:33    EKG: Independently reviewed.  Vent. rate 96 BPM PR interval * ms QRS duration 80 ms QT/QTcB 376/476 ms P-R-T axes * 21 159 Atrial fibrillation Ventricular premature complex Abnormal R-wave progression, early transition Nonspecific T abnrm, anterolateral leads Borderline prolonged QT interval Baseline wander in lead(s) V2 V3 V6  Assessment/Plan Principal Problem:   Syncope and collapse Observation/telemetry. Continue IV fluids. Correct electrolytes. Check echocardiogram. Fall precautions.  Active Problems:   Multifocal pneumonia Supplemental oxygen as needed. Use Xopenex instead of albuterol. Continue ceftriaxone and azithromycin. Check sputum gram stain, culture and sensitivity. Follow-up blood cultures and sensitivity. Check strep pneumoniae urinary antigen. Mucinex DM for cough and respiratory secretions.    Pulmonary embolism ([place]) While on apixaban therapy. Continue heparin per pharmacy.    Atrial fibrillation with RVR (HCC) CHA?DS?-VASc Score of at least 3. Age, hypertension atherosclerosis Follow-up on hyperglycemia. On anticoagulation. Resume Cardizem. Keep electrolytes optimized.   Borderline prolonged QT interval Optimize electrolytes. Avoid QT prolonging meds.    Pancytopenia ([place])   Primary adenocarcinoma of upper lobe of left lung  Monitor  CBC. Follow with hem-onc as scheduled.    Hypokalemia Correcting. Magnesium was supplemented. Follow-up potassium level.    Essential hypertension Continue Cardizem CD 240 mg p.o. daily. Monitor blood pressure and heart rate.    GERD Begin Protonix 40 mg p.o. daily.    Hyperglycemia Check fasting glucose in AM. Check hemoglobin A1c.    Dyslipidemia   Aortic atherosclerosis (HCC) Currently not on statin. Follow-up with PCP.   DVT prophylaxis: Lovenox SQ. Code Status:   Full code. Family Communication:   Disposition Plan:   Patient is from:  Home.  Anticipated DC to:  Home.  Anticipated DC date:  12/28/2020 or 12/29/2020.  Anticipated DC barriers: Clinical status. Consults called:   Admission status:  Observation/PCU.  Severity of Illness:  Reubin Milan MD [facility]  How to contact the [facility] Attending or Consulting provider [place] or covering provider during after hours [place], for this patient?   Check the care team in [facility] and look for a) attending/consulting [facility] provider listed and b) the [facility] team listed Log into [email or web address] and use [place]'s universal password to access. If you do not have the password, please contact the hospital operator. Locate the [facility] provider you are looking for under [facility] and page to a number that you can be directly reached. If you still have difficulty reaching the provider, please page the [facility] (Director on Call) for the Hospitalists listed on amion for assistance.  12/26/2020, 2:34 PM   This  document was prepared using [employer] and may contain some unintended transcription errors.

## 2020-12-27 ENCOUNTER — Encounter (HOSPITAL_COMMUNITY): Payer: Self-pay | Admitting: Internal Medicine

## 2020-12-27 ENCOUNTER — Inpatient Hospital Stay (HOSPITAL_COMMUNITY): Payer: Medicare Other

## 2020-12-27 ENCOUNTER — Observation Stay (HOSPITAL_COMMUNITY): Payer: Medicare Other

## 2020-12-27 DIAGNOSIS — Z8546 Personal history of malignant neoplasm of prostate: Secondary | ICD-10-CM | POA: Diagnosis not present

## 2020-12-27 DIAGNOSIS — I2694 Multiple subsegmental pulmonary emboli without acute cor pulmonale: Secondary | ICD-10-CM | POA: Diagnosis not present

## 2020-12-27 DIAGNOSIS — C3412 Malignant neoplasm of upper lobe, left bronchus or lung: Secondary | ICD-10-CM | POA: Diagnosis present

## 2020-12-27 DIAGNOSIS — Z83438 Family history of other disorder of lipoprotein metabolism and other lipidemia: Secondary | ICD-10-CM | POA: Diagnosis not present

## 2020-12-27 DIAGNOSIS — J189 Pneumonia, unspecified organism: Secondary | ICD-10-CM | POA: Diagnosis present

## 2020-12-27 DIAGNOSIS — I4891 Unspecified atrial fibrillation: Secondary | ICD-10-CM

## 2020-12-27 DIAGNOSIS — Z20822 Contact with and (suspected) exposure to covid-19: Secondary | ICD-10-CM | POA: Diagnosis present

## 2020-12-27 DIAGNOSIS — K219 Gastro-esophageal reflux disease without esophagitis: Secondary | ICD-10-CM | POA: Diagnosis present

## 2020-12-27 DIAGNOSIS — D638 Anemia in other chronic diseases classified elsewhere: Secondary | ICD-10-CM | POA: Diagnosis present

## 2020-12-27 DIAGNOSIS — E876 Hypokalemia: Secondary | ICD-10-CM | POA: Diagnosis present

## 2020-12-27 DIAGNOSIS — I10 Essential (primary) hypertension: Secondary | ICD-10-CM | POA: Diagnosis present

## 2020-12-27 DIAGNOSIS — J432 Centrilobular emphysema: Secondary | ICD-10-CM | POA: Diagnosis not present

## 2020-12-27 DIAGNOSIS — F17210 Nicotine dependence, cigarettes, uncomplicated: Secondary | ICD-10-CM | POA: Diagnosis present

## 2020-12-27 DIAGNOSIS — E785 Hyperlipidemia, unspecified: Secondary | ICD-10-CM | POA: Diagnosis present

## 2020-12-27 DIAGNOSIS — C7972 Secondary malignant neoplasm of left adrenal gland: Secondary | ICD-10-CM | POA: Diagnosis present

## 2020-12-27 DIAGNOSIS — I480 Paroxysmal atrial fibrillation: Secondary | ICD-10-CM | POA: Diagnosis present

## 2020-12-27 DIAGNOSIS — R911 Solitary pulmonary nodule: Secondary | ICD-10-CM | POA: Diagnosis not present

## 2020-12-27 DIAGNOSIS — J9601 Acute respiratory failure with hypoxia: Secondary | ICD-10-CM | POA: Diagnosis present

## 2020-12-27 DIAGNOSIS — T451X5A Adverse effect of antineoplastic and immunosuppressive drugs, initial encounter: Secondary | ICD-10-CM | POA: Diagnosis present

## 2020-12-27 DIAGNOSIS — C3492 Malignant neoplasm of unspecified part of left bronchus or lung: Secondary | ICD-10-CM | POA: Diagnosis not present

## 2020-12-27 DIAGNOSIS — D701 Agranulocytosis secondary to cancer chemotherapy: Secondary | ICD-10-CM | POA: Diagnosis present

## 2020-12-27 DIAGNOSIS — R040 Epistaxis: Secondary | ICD-10-CM | POA: Diagnosis not present

## 2020-12-27 DIAGNOSIS — I2609 Other pulmonary embolism with acute cor pulmonale: Secondary | ICD-10-CM | POA: Diagnosis not present

## 2020-12-27 DIAGNOSIS — I700 Atherosclerosis of aorta: Secondary | ICD-10-CM | POA: Diagnosis present

## 2020-12-27 DIAGNOSIS — C7971 Secondary malignant neoplasm of right adrenal gland: Secondary | ICD-10-CM | POA: Diagnosis present

## 2020-12-27 DIAGNOSIS — J9811 Atelectasis: Secondary | ICD-10-CM | POA: Diagnosis not present

## 2020-12-27 DIAGNOSIS — Z7901 Long term (current) use of anticoagulants: Secondary | ICD-10-CM | POA: Diagnosis not present

## 2020-12-27 DIAGNOSIS — Z7189 Other specified counseling: Secondary | ICD-10-CM | POA: Diagnosis not present

## 2020-12-27 DIAGNOSIS — R55 Syncope and collapse: Secondary | ICD-10-CM | POA: Diagnosis present

## 2020-12-27 DIAGNOSIS — D61818 Other pancytopenia: Secondary | ICD-10-CM | POA: Diagnosis present

## 2020-12-27 DIAGNOSIS — Z79899 Other long term (current) drug therapy: Secondary | ICD-10-CM | POA: Diagnosis not present

## 2020-12-27 DIAGNOSIS — J439 Emphysema, unspecified: Secondary | ICD-10-CM | POA: Diagnosis present

## 2020-12-27 DIAGNOSIS — Z515 Encounter for palliative care: Secondary | ICD-10-CM | POA: Diagnosis not present

## 2020-12-27 DIAGNOSIS — R609 Edema, unspecified: Secondary | ICD-10-CM | POA: Diagnosis not present

## 2020-12-27 LAB — CBC
HCT: 28.5 % — ABNORMAL LOW (ref 39.0–52.0)
Hemoglobin: 10 g/dL — ABNORMAL LOW (ref 13.0–17.0)
MCH: 32.5 pg (ref 26.0–34.0)
MCHC: 35.1 g/dL (ref 30.0–36.0)
MCV: 92.5 fL (ref 80.0–100.0)
Platelets: 99 10*3/uL — ABNORMAL LOW (ref 150–400)
RBC: 3.08 MIL/uL — ABNORMAL LOW (ref 4.22–5.81)
RDW: 13.4 % (ref 11.5–15.5)
WBC: 2.1 10*3/uL — ABNORMAL LOW (ref 4.0–10.5)
nRBC: 0 % (ref 0.0–0.2)

## 2020-12-27 LAB — APTT
aPTT: 118 seconds — ABNORMAL HIGH (ref 24–36)
aPTT: 66 seconds — ABNORMAL HIGH (ref 24–36)
aPTT: 97 seconds — ABNORMAL HIGH (ref 24–36)

## 2020-12-27 LAB — COMPREHENSIVE METABOLIC PANEL
ALT: 49 U/L — ABNORMAL HIGH (ref 0–44)
AST: 31 U/L (ref 15–41)
Albumin: 3.1 g/dL — ABNORMAL LOW (ref 3.5–5.0)
Alkaline Phosphatase: 84 U/L (ref 38–126)
Anion gap: 4 — ABNORMAL LOW (ref 5–15)
BUN: 37 mg/dL — ABNORMAL HIGH (ref 8–23)
CO2: 24 mmol/L (ref 22–32)
Calcium: 8 mg/dL — ABNORMAL LOW (ref 8.9–10.3)
Chloride: 109 mmol/L (ref 98–111)
Creatinine, Ser: 0.93 mg/dL (ref 0.61–1.24)
GFR, Estimated: >60 mL/min (ref >60)
Glucose, Bld: 109 mg/dL — ABNORMAL HIGH (ref 70–99)
Potassium: 3.6 mmol/L (ref 3.5–5.1)
Sodium: 137 mmol/L (ref 135–145)
Total Bilirubin: 1 mg/dL (ref 0.3–1.2)
Total Protein: 5.8 g/dL — ABNORMAL LOW (ref 6.5–8.1)

## 2020-12-27 LAB — HEPARIN LEVEL (UNFRACTIONATED)
Heparin Unfractionated: 0.87 IU/mL — ABNORMAL HIGH (ref 0.30–0.70)
Heparin Unfractionated: 0.91 IU/mL — ABNORMAL HIGH (ref 0.30–0.70)

## 2020-12-27 LAB — ECHOCARDIOGRAM COMPLETE
Calc EF: 43.4 %
Height: 70 in
P 1/2 time: 326 msec
S' Lateral: 4.2 cm
Single Plane A2C EF: 43.7 %
Single Plane A4C EF: 39.9 %
Weight: 2776 oz

## 2020-12-27 LAB — STREP PNEUMONIAE URINARY ANTIGEN: Strep Pneumo Urinary Antigen: NEGATIVE

## 2020-12-27 MED ORDER — IOHEXOL 350 MG/ML SOLN
80.0000 mL | Freq: Once | INTRAVENOUS | Status: AC | PRN
  Administered 2020-12-27: 80 mL via INTRAVENOUS

## 2020-12-27 MED ORDER — HEPARIN (PORCINE) 25000 UT/250ML-% IV SOLN
1300.0000 [IU]/h | INTRAVENOUS | Status: DC
  Filled 2020-12-27: qty 250

## 2020-12-27 MED ORDER — ENSURE ENLIVE PO LIQD
237.0000 mL | Freq: Two times a day (BID) | ORAL | Status: DC
  Administered 2020-12-27 – 2021-01-01 (×11): 237 mL via ORAL

## 2020-12-27 NOTE — Progress Notes (Signed)
ANTICOAGULATION CONSULT NOTE - follow up  Pharmacy Consult for heparin (on apixaban PTA) Indication: pulmonary embolus  Labs: aPTT 107 sec  A/P: aPTT SUPRAtherapeutic (goal 66-102 sec) on current IV heparin rate of 1400 units/hr. Per RN, no issues or bleeding to note. Reduce IV heparin rate from 1400 to 1300 units/hr. Recheck aPTT level 8 hours after rate decrease.  Adrian Saran, PharmD, BCPS Secure Chat if ?s 12/27/2020 5:37 PM

## 2020-12-27 NOTE — Progress Notes (Signed)
Patient had hard time to sleep all night and started restlessness.  He did not complain any shortness of breath. When pt was restless, SpO2 was 79-82%. Pt is  on 2L of O2 via [place], then SpO2 is 98-100%.  Pt's heparin drip was increased from 13 to14 ml/hr per order.

## 2020-12-27 NOTE — Progress Notes (Signed)
PROGRESS NOTE    Joe Hill  CHY:[id number] DOB: May 25, 1940 DOA: 12/26/2020 PCP: [name], MD    Chief Complaint  Patient presents with   Fall    Rib pain   Loss of Consciousness    Brief Narrative:  Joe Hill is a 80 y.o. male with medical history significant of bilateral adrenal mets, colon polyps, COPD/emphysema, GERD, hyperglycemia, hypertension, tobacco use, prostate cancer who was brought to the emergency department via EMS after having an unwitnessed syncopal episode and collapse at home hitting his left rib cage.  The patient does not remember having any prodromal symptoms before passing out and just remembered being on the floor.  CTA chest shows small right lower lobe pulmonary emboli.  There has been worsening of multifocal pneumonia.  Signs of primary lung cancer in the left chest with mediastinal involvement . He was found to be in atrial fib with RVR. H was started on IV rocephin and IV zithromax.  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  Echocardiogram ordered.  On IV Heparin for anti coagulation.  Troponin negative.     Multifocal Pneumonia/ acute respiratory failure with hypoxia on 2 lit of [place] oxygen.   - on IV antibiotics.  - urine for strep pneumonia is negative.  On 2 lit of [place] oxygen.    Questionable pulmonary embolism/artifact Repeat CTA study to evaluate for PE Patient started on IV heparin.    Anemia of chronic disease:  Hemoglobin is stable around 10.    Pancytopenia:  -Possibly from chemotherapy   Stage IV (T2  a, N3, M1 B) non-small cell lung cancer, adenocarcinoma presented with large central left upper lobe lung mass in addition to bilateral mediastinal lymphadenopathy and right upper lobe metastatic nodule as well as left adrenal metastasis diagnosed in October 2022.   - Systemic chemotherapy with carboplatin for AUC of 5, Alimta 500 Mg/M2 and Keytruda 200 Mg IV every 3 weeks, status post 2 cycles, last cycle on 12/17/20. -We  will notify Dr. Julien Nordmann of the patient's admission.  DVT prophylaxis: Heparin.  Code Status: (Full Code) Family Communication: none at bedside.  Disposition:   Status is: Observation  The patient will require care spanning > 2 midnights and should be moved to inpatient because: IV antibiotics      Consultants:  None.   Procedures: none.   Antimicrobials:  Antibiotics Given (last 72 hours)     Date/Time Action Medication Dose Rate   12/26/20 1402 New Bag/Given   cefTRIAXone (ROCEPHIN) 1 g in sodium chloride 0.9 % 100 mL IVPB 1 g 200 mL/hr   12/26/20 1608 New Bag/Given   azithromycin (ZITHROMAX) 500 mg in sodium chloride 0.9 % 250 mL IVPB 500 mg 250 mL/hr         Subjective: No new complaints. Reports still sob.   Objective: Vitals:   12/26/20 2129 12/26/20 2253 12/27/20 0108 12/27/20 0508  BP: 107/67 110/81 108/71 106/73  Pulse: 83  86 93  Resp: 18  18 18   Temp: (!) 97.4 F (36.3 C)  97.9 F (36.6 C) (!) 97.4 F (36.3 C)  TempSrc: Oral  Oral   SpO2: 100%  97% 98%  Weight:      Height:        Intake/Output Summary (Last 24 hours) at 12/27/2020 0852 Last data filed at 12/27/2020 3329 Gross per 24 hour  Intake 3434.3 ml  Output 740 ml  Net 2694.3 ml   Filed Weights   12/26/20 1721  Weight: 78.7 kg    Examination:  General exam: Appears calm and comfortable  Respiratory system: diminished air entryat bases, no wheezing heard. On 2 lit of [place] OXYGEN , tachypnea present.  Cardiovascular system: S1 & S2 heard, RRR. No JVD, No pedal  edema. Gastrointestinal system: Abdomen is nondistended, soft and nontender. Normal bowel sounds heard. Central nervous system: Alert and oriented. No focal neurological deficits. Extremities: Symmetric 5 x 5 power. Skin: No rashes, lesions or ulcers Psychiatry: Mood & affect appropriate.     Data Reviewed: I have personally reviewed following labs and imaging studies  CBC: Recent Labs  Lab 12/24/20 1200 12/25/20 1555 12/26/20 1120 12/27/20 0005  WBC 3.8* 5.3 2.6* 2.1*  NEUTROABS 3.2 4.3 2.1  --   HGB 12.2* 11.3* 10.5* 10.0*  HCT 35.1* 33.1* 30.2* 28.5*  MCV 93.6 92.5 93.2 92.5  PLT 199 163 119* 99*    Basic Metabolic Panel: Recent Labs  Lab 12/24/20 1200 12/25/20 1555 12/26/20 1120 12/26/20 1406 12/27/20 0005  NA 137 141 135  --  137  K 4.0 3.9 3.3*  --  3.6  CL 102 104 100  --  109  CO2 25 26 25   --  24  GLUCOSE 125* 118* 189*  --  109*  BUN 47* 53* 54*  --  37*  CREATININE 1.15 1.47* 1.14  --  0.93  CALCIUM 8.9 8.7* 8.2*  --  8.0*  MG  --   --   --  2.0  --   PHOS  --   --   --  3.1  --     GFR: Estimated Creatinine Clearance: 65.4 mL/min (by C-G formula based on SCr of 0.93 mg/dL).  Liver Function Tests: Recent Labs  Lab 12/25/20 1555 12/27/20 0005  AST 40 31  ALT 63* 49*  ALKPHOS 121 84  BILITOT 1.2 1.0  PROT 6.7 5.8*  ALBUMIN 3.5 3.1*    CBG: Recent Labs  Lab 12/26/20 1128  GLUCAP 193*     Recent Results (from the past 240 hour(s))  Resp Panel by RT-PCR (Flu A&B, Covid) Nasopharyngeal Swab     Status: None   Collection Time: 12/24/20 12:03 PM   Specimen: Nasopharyngeal Swab; Nasopharyngeal(NP) swabs in vial transport medium  Result Value Ref Range Status   SARS Coronavirus 2 by RT PCR NEGATIVE NEGATIVE Final    Comment: (NOTE) SARS-CoV-2 target nucleic acids are NOT DETECTED.  The SARS-CoV-2 RNA is generally detectable in upper respiratory specimens during the acute phase of infection. The lowest concentration of SARS-CoV-2 viral  copies this assay can detect is 138 copies/mL. A negative result does not preclude SARS-Cov-2 infection and should not be used as the sole basis for treatment or other patient management decisions. A negative result may occur with  improper specimen collection/handling, submission of specimen other than nasopharyngeal swab, presence of viral mutation(s) within the areas targeted by this assay, and inadequate number of viral copies(<138 copies/mL). A negative result must be combined with clinical observations, patient history, and epidemiological information. The expected result is  Negative.  Fact Sheet for Patients:  EntrepreneurPulse.com.au  Fact Sheet for Healthcare Providers:  IncredibleEmployment.be  This test is no t yet approved or cleared by the [place] FDA and  has been authorized for detection and/or diagnosis of SARS-CoV-2 by FDA under an Emergency Use Authorization (EUA). This EUA will remain  in effect (meaning this test can be used) for the duration of the COVID-19 declaration under Section 564(b)(1) of the Act, 21 U.S.C.section 360bbb-3(b)(1), unless the authorization is terminated  or revoked sooner.       Influenza A by PCR NEGATIVE NEGATIVE Final   Influenza B by PCR NEGATIVE NEGATIVE Final    Comment: (NOTE) The Xpert Xpress SARS-CoV-2/FLU/RSV plus assay is intended as an aid in the diagnosis of influenza from Nasopharyngeal swab specimens and should not be used as a sole basis for treatment. Nasal washings and aspirates are unacceptable for Xpert Xpress SARS-CoV-2/FLU/RSV testing.  Fact Sheet for Patients: EntrepreneurPulse.com.au  Fact Sheet for Healthcare Providers: IncredibleEmployment.be  This test is not yet approved or cleared by the [place] FDA and has been authorized for detection and/or diagnosis of SARS-CoV-2 by FDA under an Emergency Use Authorization (EUA). This  EUA will remain in effect (meaning this test can be used) for the duration of the COVID-19 declaration under Section 564(b)(1) of the Act, 21 U.S.C. section 360bbb-3(b)(1), unless the authorization is terminated or revoked.  Performed at [facility], [place] [address] [place], [place] [address]   Blood culture (routine x 2)     Status: None (Preliminary result)   Collection Time: 12/26/20  3:50 PM   Specimen: BLOOD  Result Value Ref Range Status   Specimen Description   Final    BLOOD BLOOD RIGHT ARM Performed at [facility] [address], [place], [place] [address]    Special Requests   Final    BOTTLES DRAWN AEROBIC AND ANAEROBIC Blood Culture adequate volume Performed at [facility] [address] [place], [place] [address]    Culture   Final    NO GROWTH < 12 HOURS Performed at [facility] [address] [place], [place] [address]    Report Status PENDING  Incomplete  Blood culture (routine x 2)     Status: None (Preliminary result)   Collection Time: 12/26/20  3:52 PM   Specimen: BLOOD  Result Value Ref Range Status   Specimen Description   Final    BLOOD BLOOD RIGHT HAND Performed at [facility] [address] [place], [place] [address]    Special Requests   Final    BOTTLES DRAWN AEROBIC AND ANAEROBIC Blood Culture adequate volume Performed at [facility] [address], [place], [place] [address]    Culture   Final    NO GROWTH < 12 HOURS Performed at [facility] [address], [place],  [address]    Report Status PENDING  Incomplete         Radiology Studies: DG Ribs Unilateral W/Chest Left  Result Date: 12/26/2020 CLINICAL DATA:  Fall, left anterior rib pain EXAM: LEFT RIBS AND CHEST - 3+ VIEW COMPARISON:  Chest radiographs and CT chest, 12/24/2020 FINDINGS: No displaced fracture or other bone lesions are seen involving the  ribs. There is no evidence of pneumothorax or pleural effusion. Heterogeneous airspace opacity of the left lung base. Unchanged elevation of the left hemidiaphragm. Heart size and mediastinal contours are within normal limits. IMPRESSION:  1. No displaced rib fracture. 2. Heterogeneous airspace opacity of the left lung base, consistent with infection or aspiration, as seen on prior. Electronically Signed   By: Delanna Ahmadi M.D.   On: 12/26/2020 12:14   CT Head Wo Contrast  Result Date: 12/26/2020 CLINICAL DATA:  Neck trauma. Head trauma, minor. Additional history provided: Unwitnessed syncopal episode and fall. Patient reports left rib pain, difficulty taking deep breaths. EXAM: CT HEAD WITHOUT CONTRAST CT CERVICAL SPINE WITHOUT CONTRAST TECHNIQUE: Multidetector CT imaging of the head and cervical spine was performed following the standard protocol without intravenous contrast. Multiplanar CT image reconstructions of the cervical spine were also generated. COMPARISON:  Brain MRI 10/09/2020. FINDINGS: CT HEAD FINDINGS Brain: Mild-to-moderate generalized cerebral atrophy. Comparatively mild cerebellar atrophy. 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 Background moderate/advanced patchy and ill-defined hypoattenuation within the cerebral white matter, nonspecific but compatible with chronic small vessel ischemic disease. There is no acute intracranial hemorrhage. No demarcated cortical infarct. No extra-axial fluid collection. No evidence of an intracranial mass. No midline shift. Vascular: No hyperdense vessel.  Atherosclerotic calcifications. Skull: Normal. Negative for fracture or focal lesion. Sinuses/Orbits: Visualized orbits show no acute finding. Mild mucosal thickening within the bilateral ethmoid and maxillary sinuses at the imaged levels. CT CERVICAL  SPINE FINDINGS Alignment: Straightening of the expected cervical lordosis. No significant spondylolisthesis. Skull base and vertebrae: The basion-dental and atlanto-dental intervals are maintained.No evidence of acute fracture to the cervical spine. Soft tissues and spinal canal: No prevertebral fluid or swelling. No visible canal hematoma. Disc levels: Cervical spondylosis with multilevel disc space narrowing, disc bulges, posterior disc osteophytes, endplate spurring, uncovertebral hypertrophy and facet arthrosis. Disc space narrowing is greatest at C5-C6, C6-C7 and T2-T3 (moderate at these levels). No appreciable levels of severe spinal canal stenosis. Multilevel bony neural foraminal narrowing. Multilevel ventral osteophytes. Upper chest: No consolidation within the imaged lung apices. No visible pneumothorax. Centrilobular and paraseptal emphysema with biapical bulla. Biapical pleuroparenchymal scarring. IMPRESSION: CT head: 1. No evidence of acute intracranial abnormality. 2. 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 3. Mild paranasal sinus disease at the imaged levels. CT cervical spine: 1. No evidence of acute fracture to the cervical spine. 2. Straightening of the expected cervical lordosis. 3. Cervical spondylosis, as described. 4. Emphysema (ICD10-J43.9). Associated biapical pleuroparenchymal scarring and biapical bulla. Electronically Signed   By: Kellie Simmering D.O.   On: 12/26/2020 13:33   CT Chest W Contrast  Result Date: 12/26/2020 CLINICAL DATA:  History of trauma, unwitnessed syncopal episode in an 80 year old male with history of pulmonary neoplasm. EXAM: CT CHEST WITH CONTRAST TECHNIQUE: Multidetector CT imaging of the chest was performed during intravenous contrast administration. CONTRAST:  80mL OMNIPAQUE IOHEXOL 350 MG/ML SOLN COMPARISON:  December 24, 2020 chest CT. FINDINGS:  Cardiovascular: The aorta is normal caliber. Central pulmonary vasculature is normal caliber. Filling defects in the RIGHT lower lobe segmental and subsegmental branches to posterior basal segment (image 126/3) this is in an area of beam hardening artifact. The study is a venous phase evaluation not performed for PE assessment. No additional areas are demonstrated. Heart size is normal without pericardial effusion. There is straightening of the interventricular septum that was present previously. Mediastinum/Nodes: Interval decrease in size of proximally 2 cm LEFT paramediastinal mass contiguous  with AP window soft tissue is stable in the short interval. Small lymph nodes throughout the remainder of the mediastinum likewise are unchanged. Lungs/Pleura: LEFT lower lobe nodularity and airspace process showing no change in the short interval. Signs of pulmonary emphysema and LEFT upper lobe nodule as discussed. Nodular changes have developed in the RIGHT lower lobe, multifocal nodularity since the previous study. Stable background subsolid nodule (image 119/6) 13 x 15 mm, this was not present in August of 2022 nor in September and is therefore favored to represent post inflammatory process. There is mild septal thickening in the RIGHT upper lobe that is new from recent imaging. Mild bronchial wall thickening is present throughout the chest. Upper Abdomen: Incidental imaging of upper abdominal contents without acute process. 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 No acute upper abdominal process. Musculoskeletal: Signs of subacute sternal fracture, sclerotic margins without change, no substantial displacement. Spinal degenerative changes. Costochondral elements are intact. No signs of displaced rib fracture. Visualized clavicles and scapulae are intact. IMPRESSION: Small RIGHT lower lobe pulmonary emboli. Straightening of  the interventricular septum is a stable finding and may relate to baseline elevated RIGHT heart pressures. Echocardiographic correlation may be helpful as warranted. Worsening of multifocal pneumonia. Signs of primary lung cancer in the LEFT chest with mediastinal involvement as described previously. 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 Sternal fracture favored to be subacute is new since September of 2022. Not substantially changed since December 24, 2020. Aortic Atherosclerosis (ICD10-I70.0) and Emphysema (ICD10-J43.9). Critical Value/emergent results were called by telephone at the time of interpretation on 12/26/2020 at 1:42 pm to provider [facility] , who verbally acknowledged these results. Electronically Signed   By: Zetta Bills M.D.   On: 12/26/2020 13:43   CT Cervical Spine Wo Contrast  Result Date: 12/26/2020 CLINICAL DATA:  Neck trauma. Head trauma, minor. Additional history provided: Unwitnessed syncopal episode and fall. Patient reports left rib pain, difficulty taking deep breaths. EXAM: CT HEAD WITHOUT CONTRAST CT CERVICAL SPINE WITHOUT CONTRAST TECHNIQUE: Multidetector CT imaging of the head and cervical spine was performed following the standard protocol without intravenous contrast. Multiplanar CT image reconstructions of the cervical spine were also generated. COMPARISON:  Brain MRI 10/09/2020. FINDINGS: CT HEAD FINDINGS Brain: Mild-to-moderate generalized cerebral atrophy. Comparatively mild cerebellar atrophy. 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 Background moderate/advanced patchy and ill-defined hypoattenuation within the cerebral white matter, nonspecific but compatible with chronic small vessel ischemic disease. There is no acute intracranial hemorrhage. No demarcated cortical infarct. No  extra-axial fluid collection. No evidence of an intracranial mass. No midline shift. Vascular: No hyperdense vessel.  Atherosclerotic calcifications. Skull: Normal. Negative for fracture or focal lesion. Sinuses/Orbits: Visualized orbits show no acute finding. Mild mucosal thickening within the bilateral ethmoid and maxillary sinuses at the imaged levels. CT CERVICAL SPINE FINDINGS Alignment: Straightening of the expected cervical lordosis. No significant spondylolisthesis. Skull base and vertebrae: The basion-dental and atlanto-dental intervals are maintained.No evidence of acute fracture to the cervical spine. Soft tissues and spinal canal: No prevertebral fluid or swelling. No visible canal hematoma. Disc levels: Cervical spondylosis with multilevel disc space narrowing, disc bulges, posterior disc osteophytes, endplate spurring, uncovertebral hypertrophy and facet arthrosis. Disc space narrowing is greatest at C5-C6, C6-C7 and T2-T3 (moderate  at these levels). No appreciable levels of severe spinal canal stenosis. Multilevel bony neural foraminal narrowing. Multilevel ventral osteophytes. Upper chest: No consolidation within the imaged lung apices. No visible pneumothorax. Centrilobular and paraseptal emphysema with biapical bulla. Biapical pleuroparenchymal scarring. IMPRESSION: CT head: 1. No evidence of acute intracranial abnormality. 2. 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 3. Mild paranasal sinus disease at the imaged levels. CT cervical spine: 1. No evidence of acute fracture to the cervical spine. 2. Straightening of the expected cervical lordosis. 3. Cervical spondylosis, as described. 4. Emphysema (ICD10-J43.9). Associated biapical pleuroparenchymal scarring and biapical bulla. Electronically Signed   By: Kellie Simmering D.O.   On: 12/26/2020 13:33        Scheduled Meds:  diltiazem   240 mg Oral Daily   feeding supplement  237 mL Oral BID BM   folic acid  1 mg Oral Daily   pantoprazole  40 mg Oral Daily   Continuous Infusions:  sodium chloride 125 mL/hr at 12/26/20 1707   azithromycin 500 mg (12/26/20 1608)   cefTRIAXone (ROCEPHIN)  IV     heparin 1,400 Units/hr (12/27/20 0146)     LOS: 0 days        Hosie Poisson, MD [facility]   To contact the attending provider between 7A-7P or the covering provider during after hours 7P-7A, please log into the web site [email or web address] and access using universal [place] password for that web site. If you do not have the password, please call the hospital operator.  12/27/2020, 8:52 AM

## 2020-12-27 NOTE — Progress Notes (Signed)
ANTICOAGULATION CONSULT NOTE - follow up  Pharmacy Consult for heparin (on apixaban PTA) Indication: pulmonary embolus  Allergies  Allergen Reactions   Penicillins Hives    Patient Measurements: Height: 5\' 10"  (177.8 cm) Weight: 78.7 kg (173 lb 8 oz) IBW/kg (Calculated) : 73 Heparin Dosing Weight: 78.7 kg  Vital Signs: Temp: 97.4 F (36.3 C) (12/03 0508) Temp Source: Oral (12/03 0108) BP: 115/70 (12/03 0911) Pulse Rate: 93 (12/03 0508)  Labs: Recent Labs    12/25/20 1555 12/26/20 1120 12/26/20 1406 12/26/20 1430 12/27/20 0005 12/27/20 0817  HGB 11.3* 10.5*  --   --  10.0*  --   HCT 33.1* 30.2*  --   --  28.5*  --   PLT 163 119*  --   --  99*  --   APTT  --   --  28  --  66* 97*  HEPARINUNFRC  --   --   --  0.84* 0.87* 0.91*  CREATININE 1.47* 1.14  --   --  0.93  --   TROPONINIHS  --  7 6  --   --   --      Estimated Creatinine Clearance: 65.4 mL/min (by C-G formula based on SCr of 0.93 mg/dL).   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 Pharmacy consulted to dose heparin.  Pt on apixaban 5 mg po bid PTA for AFib. Last dose apixaban 12/1 PM per EDP.  Hg 10.5, PLT 119 - low. SCr 1.14 Baseline HL 0.84, aPTT 28  aPTT 97 now at upper end of therapeutic, heparin @ 1400 units/hr HL 0.91 as expected with apixaban on board No bleeding or interruptions noted  Goal of Therapy:  Heparin level 0.3-0.7 units/ml aPTT 66-102 seconds Monitor platelets by anticoagulation protocol: Yes   Plan:  Continue heparin drip at 1400 units/hr Will dose with aPTT due to pt on apixaban PTA Check confirmatory 8hr aPTT Daily aPTT,  heparin level and CBC   Adrian Saran, PharmD, BCPS Secure Chat if ?s 12/27/2020 10:18 AM

## 2020-12-27 NOTE — Progress Notes (Signed)
  Echocardiogram 2D Echocardiogram has been performed.  Joe Hill 12/27/2020, 10:33 AM

## 2020-12-27 NOTE — Progress Notes (Signed)
ANTICOAGULATION CONSULT NOTE - follow up  Pharmacy Consult for heparin Indication: pulmonary embolus  Allergies  Allergen Reactions   Penicillins Hives    Patient Measurements: Height: 5\' 10"  (177.8 cm) Weight: 78.7 kg (173 lb 8 oz) IBW/kg (Calculated) : 73 Heparin Dosing Weight: 78.7 kg  Vital Signs: Temp: 97.9 F (36.6 C) (12/03 0108) Temp Source: Oral (12/03 0108) BP: 108/71 (12/03 0108) Pulse Rate: 86 (12/03 0108)  Labs: Recent Labs    12/24/20 1200 12/25/20 1555 12/26/20 1120 12/26/20 1406 12/26/20 1430 12/27/20 0005  HGB 12.2* 11.3* 10.5*  --   --   --   HCT 35.1* 33.1* 30.2*  --   --   --   PLT 199 163 119*  --   --   --   APTT  --   --   --  28  --  66*  HEPARINUNFRC  --   --   --   --  0.84* 0.87*  CREATININE 1.15 1.47* 1.14  --   --   --   TROPONINIHS  --   --  7 6  --   --      Estimated Creatinine Clearance: 53.4 mL/min (by C-G formula based on SCr of 1.14 mg/dL).   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 Pharmacy consulted to dose heparin.  Pt on apixaban 5 mg po bid PTA for AFib. Last dose apixaban 12/1 PM per EDP.  Hg 10.5, PLT 119 - low. SCr 1.14 Baseline HL 0.84, aPTT 28  aPTT 66 low end of therapeutic, heparin @ 1300 units/hr HL 0.87 as expected with apixaban on board No bleeding or interruptions noted  Goal of Therapy:  Heparin level 0.3-0.7 units/ml aPTT 66-102 seconds Monitor platelets by anticoagulation protocol: Yes   Plan:  Increase heparin drip to 1400 units/hr Will dose with aPTT due to pt on apixaban PTA Check 8hr aPTT/HL Daily aPTT, heparin level and CBC  Dolly Rias  RPh 12/27/2020, 1:14 AM

## 2020-12-28 DIAGNOSIS — Z7189 Other specified counseling: Secondary | ICD-10-CM

## 2020-12-28 DIAGNOSIS — Z515 Encounter for palliative care: Secondary | ICD-10-CM

## 2020-12-28 LAB — BASIC METABOLIC PANEL
Anion gap: 5 (ref 5–15)
BUN: 25 mg/dL — ABNORMAL HIGH (ref 8–23)
CO2: 23 mmol/L (ref 22–32)
Calcium: 7.6 mg/dL — ABNORMAL LOW (ref 8.9–10.3)
Chloride: 108 mmol/L (ref 98–111)
Creatinine, Ser: 0.93 mg/dL (ref 0.61–1.24)
GFR, Estimated: >60 mL/min (ref >60)
Glucose, Bld: 115 mg/dL — ABNORMAL HIGH (ref 70–99)
Potassium: 3.1 mmol/L — ABNORMAL LOW (ref 3.5–5.1)
Sodium: 136 mmol/L (ref 135–145)

## 2020-12-28 LAB — CBC WITH DIFFERENTIAL/PLATELET
Abs Immature Granulocytes: 0 10*3/uL (ref 0.00–0.07)
Basophils Absolute: 0 10*3/uL (ref 0.0–0.1)
Basophils Relative: 0 %
Eosinophils Absolute: 0 10*3/uL (ref 0.0–0.5)
Eosinophils Relative: 1 %
HCT: 23.8 % — ABNORMAL LOW (ref 39.0–52.0)
Hemoglobin: 8.4 g/dL — ABNORMAL LOW (ref 13.0–17.0)
Immature Granulocytes: 0 %
Lymphocytes Relative: 58 %
Lymphs Abs: 0.5 10*3/uL — ABNORMAL LOW (ref 0.7–4.0)
MCH: 32.4 pg (ref 26.0–34.0)
MCHC: 35.3 g/dL (ref 30.0–36.0)
MCV: 91.9 fL (ref 80.0–100.0)
Monocytes Absolute: 0.1 10*3/uL (ref 0.1–1.0)
Monocytes Relative: 11 %
Neutro Abs: 0.3 10*3/uL — CL (ref 1.7–7.7)
Neutrophils Relative %: 30 %
Platelets: 51 10*3/uL — ABNORMAL LOW (ref 150–400)
RBC: 2.59 MIL/uL — ABNORMAL LOW (ref 4.22–5.81)
RDW: 13.6 % (ref 11.5–15.5)
WBC: 0.9 10*3/uL — CL (ref 4.0–10.5)
nRBC: 0 % (ref 0.0–0.2)

## 2020-12-28 LAB — HEPARIN LEVEL (UNFRACTIONATED): Heparin Unfractionated: 0.58 IU/mL (ref 0.30–0.70)

## 2020-12-28 LAB — APTT: aPTT: 135 seconds — ABNORMAL HIGH (ref 24–36)

## 2020-12-28 LAB — MAGNESIUM: Magnesium: 1.9 mg/dL (ref 1.7–2.4)

## 2020-12-28 MED ORDER — POTASSIUM CHLORIDE CRYS ER 20 MEQ PO TBCR
40.0000 meq | EXTENDED_RELEASE_TABLET | Freq: Two times a day (BID) | ORAL | Status: AC
  Administered 2020-12-28 (×2): 40 meq via ORAL
  Filled 2020-12-28 (×2): qty 2

## 2020-12-28 MED ORDER — OXYMETAZOLINE HCL 0.05 % NA SOLN
1.0000 | Freq: Two times a day (BID) | NASAL | Status: AC | PRN
  Administered 2020-12-28: 05:00:00 1 via NASAL
  Filled 2020-12-28: qty 15

## 2020-12-28 MED ORDER — HEPARIN (PORCINE) 25000 UT/250ML-% IV SOLN
1100.0000 [IU]/h | INTRAVENOUS | Status: DC
  Administered 2020-12-28: 05:00:00 1100 [IU]/h via INTRAVENOUS
  Filled 2020-12-28: qty 250

## 2020-12-28 MED ORDER — APIXABAN 5 MG PO TABS
5.0000 mg | ORAL_TABLET | Freq: Two times a day (BID) | ORAL | Status: DC
Start: 2020-12-28 — End: 2020-12-30
  Administered 2020-12-28 – 2020-12-29 (×4): 5 mg via ORAL
  Filled 2020-12-28 (×4): qty 1

## 2020-12-28 NOTE — Progress Notes (Signed)
ANTICOAGULATION CONSULT NOTE - follow up  Pharmacy Consult for heparin (on apixaban PTA) Indication: pulmonary embolus  Allergies  Allergen Reactions   Penicillins Hives    Patient Measurements: Height: 5\' 10"  (177.8 cm) Weight: 78.7 kg (173 lb 8 oz) IBW/kg (Calculated) : 73 Heparin Dosing Weight: 78.7 kg  Vital Signs: Temp: 98.1 F (36.7 C) (12/03 2213) Temp Source: Oral (12/03 2213) BP: 130/84 (12/03 2213) Pulse Rate: 93 (12/03 2213)  Labs: Recent Labs     0000 12/26/20 1120 12/26/20 1406 12/26/20 1430 12/27/20 0005 12/27/20 0817 12/27/20 1632 12/28/20 0138  HGB  --  10.5*  --   --  10.0*  --   --  8.4*  HCT  --  30.2*  --   --  28.5*  --   --  23.8*  PLT  --  119*  --   --  99*  --   --  51*  APTT   < >  --  28  --  66* 97* 118* 135*  HEPARINUNFRC  --   --   --    < > 0.87* 0.91*  --  0.58  CREATININE  --  1.14  --   --  0.93  --   --  0.93  TROPONINIHS  --  7 6  --   --   --   --   --    < > = values in this interval not displayed.     Estimated Creatinine Clearance: 65.4 mL/min (by C-G formula based on SCr of 0.93 mg/dL).   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 Pharmacy consulted to dose heparin.  Pt on apixaban 5 mg po bid PTA for AFib. Last dose apixaban 12/1 PM per EDP.  Hg 10.5, PLT 119 - low. SCr 1.14 Baseline HL 0.84, aPTT 28  12/28/2020 aPTT 135 supra-therapeutic on 1300 units/hr Heparin level 0.58 Hgb down to 8.4 Plts down to 51 RN reports nose bleed   Goal of Therapy:  Heparin level 0.3-0.7 units/ml aPTT 66-102 seconds Monitor platelets by anticoagulation protocol: Yes   Plan:  RN d/w  with overnight MD, wants to continue heparin drip Hold heparin x 1 hour Resume heparin at 1100 units/hr Check  8hr aPTT/HL Daily aPTT, heparin level and CBC  Dolly Rias RPh 12/28/2020, 3:10 AM

## 2020-12-28 NOTE — Progress Notes (Signed)
Pt has critical results of WBC (0.9)and Neut# (0.3). notified to a hospitalist.

## 2020-12-28 NOTE — Progress Notes (Signed)
Notified By tele that patient had 1.6 second ventricular pause. Patient was asymptomatic. MD is aware.

## 2020-12-28 NOTE — Progress Notes (Addendum)
PROGRESS NOTE    Joe Hill  XTG:[id number] DOB: 04/24/1940 DOA: 12/26/2020 PCP: [name], MD    Chief Complaint  Patient presents with   Fall    Rib pain   Loss of Consciousness    Brief Narrative:  Joe Hill is a 80 y.o. male with medical history significant of bilateral adrenal mets, colon polyps, COPD/emphysema, GERD, hyperglycemia, hypertension, tobacco use, prostate cancer who was brought to the emergency department via EMS after having an unwitnessed syncopal episode and collapse at home hitting his left rib cage.  The patient does not remember having any prodromal symptoms before passing out and just remembered being on the floor.  CTA chest shows small right lower lobe pulmonary emboli.  There has been worsening of multifocal pneumonia.  Signs of primary lung cancer in the left chest with mediastinal involvement . He was found to be in atrial fib with RVR. H was started on IV rocephin and IV zithromax.  Pt seen and examined.  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  Echocardiogram ordered. On eliquis for anti coagulation.  Troponin negative.  Pt denies any chest pain or sob.    Multifocal Pneumonia/ acute respiratory failure with hypoxia initially requiring.  2 lit of [place] oxygen.   - on IV antibiotics.  - urine for strep pneumonia is negative.  - weaned off oxygen.    Questionable pulmonary embolism/artifact Repeat CTA study to evaluate for PE is negative for PE.    Anemia of chronic disease:  Hemoglobin is stable around 10.     Pancytopenia:  -Possibly from chemotherapy, will probably  need Neupogen. Will discuss with oncology in am.    Stage IV (T2 a, N3, M1 B) non-small cell lung cancer, adenocarcinoma presented with large central left upper lobe lung mass in addition to bilateral mediastinal lymphadenopathy and right upper lobe metastatic nodule as well as left adrenal metastasis diagnosed in October 2022.   - Systemic chemotherapy with carboplatin for AUC of 5, Alimta 500 Mg/M2 and Keytruda 200 Mg IV every 3 weeks, status post 2 cycles, last cycle on 12/17/20. -We  will notify Dr. Julien Nordmann of the patient's admission.  GERD  Stable.   DVT prophylaxis: Heparin.  Code Status: (Full Code) Family Communication: none at bedside.  Disposition:   Status is: Inpatient.   The patient will require care spanning > 2 midnights and should be moved to inpatient because: IV antibiotics      Consultants:  None.   Procedures: echocardiogram.   Antimicrobials:  Antibiotics Given (last 72 hours)     Date/Time Action Medication Dose Rate   12/26/20 1402 New Bag/Given   cefTRIAXone (ROCEPHIN) 1 g in sodium chloride 0.9 % 100 mL IVPB 1 g 200 mL/hr   12/26/20 1608 New Bag/Given   azithromycin (ZITHROMAX) 500 mg in sodium chloride 0.9 % 250 mL IVPB 500 mg 250 mL/hr   12/27/20 1329 New Bag/Given   cefTRIAXone (ROCEPHIN) 1 g in sodium chloride 0.9 % 100 mL IVPB 1 g 200 mL/hr   12/27/20  1623 New Bag/Given   azithromycin (ZITHROMAX) 500 mg in sodium chloride 0.9 % 250 mL IVPB 500 mg 250 mL/hr         Subjective: Sob improving. No chest pain, able to wean him off oxygen today.   Objective: Vitals:   12/27/20 1228 12/27/20 1600 12/27/20 2213 12/28/20 0633  BP: 106/69  130/84 131/81  Pulse: 87  93 95  Resp: 16  18 18   Temp: 98.2 F (36.8 C)  98.1 F (36.7 C) 97.7 F (36.5 C)  TempSrc: Oral  Oral   SpO2: 100% 100% 100% 100%  Weight:      Height:        Intake/Output Summary (Last 24 hours) at  12/28/2020 1309 Last data filed at 12/28/2020 0436 Gross per 24 hour  Intake 2810.51 ml  Output 1525 ml  Net 1285.51 ml    Filed Weights   12/26/20 1721  Weight: 78.7 kg    Examination:  General exam: elderly gentleman , not in distress.  Respiratory system: diminished air entry at bases.  Cardiovascular system: S1 & S2 heard, irregularly irregular. No JVD,  No pedal edema. Gastrointestinal system: Abdomen is nondistended, soft and nontender.  Normal bowel sounds heard. Central nervous system: Alert and oriented. No focal neurological deficits. Extremities: Symmetric 5 x 5 power. Skin: No rashes, lesions or ulcers Psychiatry: Mood & affect appropriate.      Data Reviewed: I have personally reviewed following labs and imaging studies  CBC: Recent Labs  Lab 12/24/20 1200 12/25/20 1555 12/26/20 1120 12/27/20 0005 12/28/20 0138  WBC 3.8* 5.3 2.6* 2.1* 0.9*  NEUTROABS 3.2 4.3 2.1  --  0.3*  HGB 12.2* 11.3* 10.5* 10.0* 8.4*  HCT 35.1* 33.1* 30.2* 28.5* 23.8*  MCV 93.6 92.5 93.2 92.5 91.9  PLT 199 163 119* 99* 51*     Basic Metabolic Panel: Recent Labs  Lab 12/24/20 1200 12/25/20 1555 12/26/20 1120 12/26/20 1406 12/27/20 0005 12/28/20 0138 12/28/20 1020  NA 137 141 135  --  137 136  --   K 4.0 3.9 3.3*  --  3.6 3.1*  --   CL 102 104 100  --  109 108  --   CO2 25 26 25   --  24 23  --   GLUCOSE 125* 118* 189*  --  109* 115*  --   BUN 47* 53* 54*  --  37* 25*  --   CREATININE 1.15 1.47* 1.14  --  0.93 0.93  --   CALCIUM 8.9 8.7* 8.2*  --  8.0* 7.6*  --   MG  --   --   --  2.0  --   --  1.9  PHOS  --   --   --  3.1  --   --   --      GFR: Estimated Creatinine Clearance: 65.4 mL/min (by C-G formula based on SCr of 0.93 mg/dL).  Liver Function Tests: Recent Labs  Lab 12/25/20 1555 12/27/20 0005  AST 40 31  ALT 63* 49*  ALKPHOS 121 84  BILITOT 1.2 1.0  PROT 6.7 5.8*  ALBUMIN 3.5 3.1*     CBG: Recent Labs  Lab 12/26/20 1128  GLUCAP 193*       Recent Results (from the past 240 hour(s))  Resp Panel by RT-PCR (Flu A&B, Covid) Nasopharyngeal Swab     Status: None   Collection Time: 12/24/20 12:03 PM   Specimen: Nasopharyngeal Swab; Nasopharyngeal(NP) swabs in vial transport medium  Result Value  Ref Range Status   SARS Coronavirus 2 by RT PCR NEGATIVE NEGATIVE Final    Comment: (NOTE) SARS-CoV-2 target nucleic acids are NOT DETECTED.  The SARS-CoV-2 RNA is generally detectable in upper respiratory specimens during the acute phase of infection. The lowest concentration of SARS-CoV-2 viral copies this assay can detect is 138 copies/mL. A negative result does not preclude SARS-Cov-2 infection and should not be used as the sole basis for treatment or other patient management decisions. A negative result may occur with  improper specimen collection/handling, submission of specimen other than nasopharyngeal swab, presence of viral mutation(s) within the areas targeted by this assay, and inadequate number of viral copies(<138 copies/mL). A negative result must be combined with clinical observations, patient history, and epidemiological information. The expected result is Negative.  Fact Sheet for Patients:  EntrepreneurPulse.com.au  Fact Sheet for Healthcare Providers:  IncredibleEmployment.be  This test is no t yet approved or cleared by the [place] FDA and  has been authorized for detection and/or diagnosis of SARS-CoV-2 by FDA under an Emergency Use Authorization (EUA). This EUA will remain  in effect (meaning this test can be used) for the duration of the COVID-19 declaration under Section 564(b)(1) of the Act, 21 U.S.C.section 360bbb-3(b)(1), unless the authorization is terminated  or revoked sooner.       Influenza A by PCR NEGATIVE NEGATIVE Final   Influenza B by PCR NEGATIVE NEGATIVE Final    Comment: (NOTE) The Xpert Xpress SARS-CoV-2/FLU/RSV plus assay is intended  as an aid in the diagnosis of influenza from Nasopharyngeal swab specimens and should not be used as a sole basis for treatment. Nasal washings and aspirates are unacceptable for Xpert Xpress SARS-CoV-2/FLU/RSV testing.  Fact Sheet for Patients: EntrepreneurPulse.com.au  Fact Sheet for Healthcare Providers: IncredibleEmployment.be  This test is not yet approved or cleared by the [place] FDA and has been authorized for detection and/or diagnosis of SARS-CoV-2 by FDA under an Emergency Use Authorization (EUA). This EUA will remain in effect (meaning this test can be used) for the duration of the COVID-19 declaration under Section 564(b)(1) of the Act, 21 U.S.C. section 360bbb-3(b)(1), unless the authorization is terminated or revoked.  Performed at [facility], [place] [address] [place], [place] [address]   Blood culture (routine x 2)     Status: None (Preliminary result)   Collection Time: 12/26/20  3:50 PM   Specimen: BLOOD  Result Value Ref Range Status   Specimen Description   Final    BLOOD BLOOD RIGHT ARM Performed at [facility] [address], [place], [place] [address]    Special Requests   Final    BOTTLES DRAWN AEROBIC AND ANAEROBIC Blood Culture adequate volume Performed at [facility] [address], [place], [place] [address]    Culture   Final    NO GROWTH 2 DAYS Performed at [facility] [address], [place], [place] [address]    Report Status PENDING  Incomplete  Blood culture (routine x 2)     Status: None (Preliminary result)   Collection Time: 12/26/20  3:52 PM   Specimen: BLOOD  Result Value Ref Range Status   Specimen Description   Final    BLOOD BLOOD RIGHT HAND Performed at [facility] [address] [place], [place] [address]    Special Requests   Final    BOTTLES DRAWN AEROBIC AND ANAEROBIC Blood Culture adequate  volume Performed at [facility], [place] [address], [place], [place] [address]    Culture   Final    NO GROWTH 2 DAYS Performed at [facility] [address], [place], [place] [address]    Report Status PENDING  Incomplete          Radiology Studies: CT Head Wo Contrast  Result Date: 12/26/2020 CLINICAL DATA:  Neck trauma. Head trauma, minor. Additional history provided: Unwitnessed syncopal episode and fall. Patient reports left rib pain, difficulty taking deep breaths. EXAM: CT HEAD WITHOUT CONTRAST CT CERVICAL SPINE WITHOUT CONTRAST TECHNIQUE: Multidetector CT imaging of the head and cervical spine was performed following the standard protocol without intravenous contrast. Multiplanar CT image reconstructions of the cervical spine were also generated. COMPARISON:  Brain MRI 10/09/2020. FINDINGS: CT HEAD FINDINGS Brain: Mild-to-moderate generalized cerebral atrophy. Comparatively mild cerebellar atrophy. 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 Background moderate/advanced patchy and ill-defined hypoattenuation within the cerebral white matter, nonspecific but compatible with chronic small vessel ischemic disease. There is no acute intracranial hemorrhage. No demarcated cortical infarct. No extra-axial fluid collection. No evidence of an intracranial mass. No midline shift. Vascular: No hyperdense vessel.  Atherosclerotic calcifications. Skull: Normal. Negative for fracture or focal lesion. Sinuses/Orbits: Visualized orbits show no acute finding. Mild mucosal thickening within the bilateral ethmoid and maxillary sinuses at the imaged levels. CT CERVICAL SPINE FINDINGS Alignment: Straightening of the expected cervical lordosis. No significant spondylolisthesis. Skull base and vertebrae: The basion-dental and atlanto-dental  intervals are maintained.No evidence of acute fracture to the cervical spine. Soft tissues and spinal canal: No prevertebral fluid or swelling. No visible canal hematoma. Disc levels: Cervical spondylosis with multilevel disc space narrowing, disc bulges, posterior disc osteophytes, endplate spurring, uncovertebral hypertrophy and facet arthrosis. Disc space narrowing is greatest at C5-C6, C6-C7 and T2-T3 (moderate at these levels). No appreciable levels of severe spinal canal stenosis. Multilevel bony neural foraminal narrowing. Multilevel ventral osteophytes. Upper chest: No consolidation within the imaged lung apices. No visible pneumothorax. Centrilobular and paraseptal emphysema with biapical bulla. Biapical pleuroparenchymal scarring. IMPRESSION: CT head: 1. No evidence of acute intracranial abnormality. 2. 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 3. Mild paranasal sinus disease at the imaged levels. CT cervical spine: 1. No evidence of acute fracture to the cervical spine. 2. Straightening of the expected cervical lordosis. 3. Cervical spondylosis, as described. 4. Emphysema (ICD10-J43.9). Associated biapical pleuroparenchymal scarring and biapical bulla. Electronically Signed   By: Kellie Simmering D.O.   On: 12/26/2020 13:33   CT Chest W Contrast  Addendum Date: 12/27/2020   ADDENDUM REPORT: 12/27/2020 10:07 ADDENDUM: Upon reassessing imaging there is considerable artifact passing through the RIGHT lower chest and this study is venous phase. Despite images that show clear intraluminal low attenuation and high suspicion for pulmonary embolism there is question due to the artifact seen in this area. Furthermore, to help determine best future management in this complex patient would suggest a study performed in the appropriate phase, CT PE protocol with the arms up if possible. These results were  called by telephone at the time of interpretation on 12/27/2020 at 10:07 am to provider Hosie Poisson MD, who verbally acknowledged these results. Electronically Signed   By: Zetta Bills M.D.   On: 12/27/2020 10:07   Result  Date: 12/27/2020 CLINICAL DATA:  History of trauma, unwitnessed syncopal episode in an 80 year old male with history of pulmonary neoplasm. EXAM: CT CHEST WITH CONTRAST TECHNIQUE: Multidetector CT imaging of the chest was performed during intravenous contrast administration. CONTRAST:  39mL OMNIPAQUE IOHEXOL 350 MG/ML SOLN COMPARISON:  December 24, 2020 chest CT. FINDINGS: Cardiovascular: The aorta is normal caliber. Central pulmonary vasculature is normal caliber. Filling defects in the RIGHT lower lobe segmental and subsegmental branches to posterior basal segment (image 126/3) this is in an area of beam hardening artifact. The study is a venous phase evaluation not performed for PE assessment. No additional areas are demonstrated. Heart size is normal without pericardial effusion. There is straightening of the interventricular septum that was present previously. Mediastinum/Nodes: Interval decrease in size of proximally 2 cm LEFT paramediastinal mass contiguous with AP window soft tissue is stable in the short interval. Small lymph nodes throughout the remainder of the mediastinum likewise are unchanged. Lungs/Pleura: LEFT lower lobe nodularity and airspace process showing no change in the short interval. Signs of pulmonary emphysema and LEFT upper lobe nodule as discussed. Nodular changes have developed in the RIGHT lower lobe, multifocal nodularity since the previous study. Stable background subsolid nodule (image 119/6) 13 x 15 mm, this was not present in August of 2022 nor in September and is therefore favored to represent post inflammatory process. There is mild septal thickening in the RIGHT upper lobe that is new from recent imaging. Mild bronchial wall thickening is present  throughout the chest. Upper Abdomen: Incidental imaging of upper abdominal contents without acute process. 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 No acute upper abdominal process. Musculoskeletal: Signs of subacute sternal fracture, sclerotic margins without change, no substantial displacement. Spinal degenerative changes. Costochondral elements are intact. No signs of displaced rib fracture. Visualized clavicles and scapulae are intact. IMPRESSION: Small RIGHT lower lobe pulmonary emboli. Straightening of the interventricular septum is a stable finding and may relate to baseline elevated RIGHT heart pressures. Echocardiographic correlation may be helpful as warranted. Worsening of multifocal pneumonia. Signs of primary lung cancer in the LEFT chest with mediastinal involvement as described previously. 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 Sternal fracture favored to be subacute is new since September of 2022. Not substantially changed since December 24, 2020. Aortic Atherosclerosis (ICD10-I70.0) and Emphysema (ICD10-J43.9). Critical Value/emergent results were called by telephone at the time of interpretation on 12/26/2020 at 1:42 pm to provider [facility] , who verbally acknowledged these results. Electronically Signed: By: Zetta Bills M.D. On: 12/26/2020 13:43   CT Angio Chest Pulmonary Embolism (PE) W or WO Contrast  Result Date: 12/27/2020 CLINICAL DATA:  PE suspected, high probability. Possible PE versus artifact identified incidentally on a CT scan of the chest with contrast from yesterday. EXAM: CT ANGIOGRAPHY CHEST WITH CONTRAST TECHNIQUE: Multidetector CT imaging of the chest was performed using the standard protocol during bolus administration of intravenous contrast. Multiplanar CT image reconstructions and MIPs were obtained to evaluate the vascular anatomy. CONTRAST:   61mL OMNIPAQUE IOHEXOL 350 MG/ML SOLN COMPARISON:  CT scan of the chest 12/26/2020 FINDINGS: Cardiovascular: Satisfactory opacification of the pulmonary arteries to the segmental level. No evidence of pulmonary embolism. Normal heart size. The previously questioned pulmonary emboli are not evident on today's exam and almost certainly represented focal beam hardening artifact. No pericardial effusion. Aortic and coronary artery atherosclerotic calcifications. Mediastinum/Nodes: Stable left paramediastinal  mass measuring approximately 2 cm in contiguous with soft tissue in the AP window. Additionally, small scattered mediastinal lymph nodes are also unchanged compared to recent prior imaging. Lungs/Pleura: Continued stability of nonspecific left lower lobe nodularity and left upper lobe pulmonary nodule. Stable appearance of multifocal airspace opacifications in the right lower lobe. Background of moderately severe centrilobular pulmonary emphysema. Slightly increased right lower lobe atelectasis. No pleural effusion or pneumothorax. Overall, unchanged compared to yesterday. Upper Abdomen: No acute abnormality. Musculoskeletal: No acute abnormality. Review of the MIP images confirms the above findings. IMPRESSION: 1. Dedicated CTA imaging confirms that the previously suspected right lower lobe segmental pulmonary emboli were in fact artifactual. 2. Otherwise, unchanged appearance of multifocal pneumonia and primary left chest lung cancer with mediastinal involvement. Aortic Atherosclerosis (ICD10-I70.0) and Emphysema (ICD10-J43.9). Electronically Signed   By: Jacqulynn Cadet M.D.   On: 12/27/2020 13:38   CT Cervical Spine Wo Contrast  Result Date: 12/26/2020 CLINICAL DATA:  Neck trauma. Head trauma, minor. Additional history provided: Unwitnessed syncopal episode and fall. Patient reports left rib pain, difficulty taking deep breaths. EXAM: CT HEAD WITHOUT CONTRAST CT CERVICAL SPINE WITHOUT CONTRAST TECHNIQUE:  Multidetector CT imaging of the head and cervical spine was performed following the standard protocol without intravenous contrast. Multiplanar CT image reconstructions of the cervical spine were also generated. COMPARISON:  Brain MRI 10/09/2020. FINDINGS: CT HEAD FINDINGS Brain: Mild-to-moderate generalized cerebral atrophy. Comparatively mild cerebellar atrophy. 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 Background moderate/advanced patchy and ill-defined hypoattenuation within the cerebral white matter, nonspecific but compatible with chronic small vessel ischemic disease. There is no acute intracranial hemorrhage. No demarcated cortical infarct. No extra-axial fluid collection. No evidence of an intracranial mass. No midline shift. Vascular: No hyperdense vessel.  Atherosclerotic calcifications. Skull: Normal. Negative for fracture or focal lesion. Sinuses/Orbits: Visualized orbits show no acute finding. Mild mucosal thickening within the bilateral ethmoid and maxillary sinuses at the imaged levels. CT CERVICAL SPINE FINDINGS Alignment: Straightening of the expected cervical lordosis. No significant spondylolisthesis. Skull base and vertebrae: The basion-dental and atlanto-dental intervals are maintained.No evidence of acute fracture to the cervical spine. Soft tissues and spinal canal: No prevertebral fluid or swelling. No visible canal hematoma. Disc levels: Cervical spondylosis with multilevel disc space narrowing, disc bulges, posterior disc osteophytes, endplate spurring, uncovertebral hypertrophy and facet arthrosis. Disc space narrowing is greatest at C5-C6, C6-C7 and T2-T3 (moderate at these levels). No appreciable levels of severe spinal canal stenosis. Multilevel bony neural foraminal narrowing. Multilevel ventral osteophytes. Upper chest: No  consolidation within the imaged lung apices. No visible pneumothorax. Centrilobular and paraseptal emphysema with biapical bulla. Biapical pleuroparenchymal scarring. IMPRESSION: CT head: 1. No evidence of acute intracranial abnormality. 2. 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 3. Mild paranasal sinus disease at the imaged levels. CT cervical spine: 1. No evidence of acute fracture to the cervical spine. 2. Straightening of the expected cervical lordosis. 3. Cervical spondylosis, as described. 4. Emphysema (ICD10-J43.9). Associated biapical pleuroparenchymal scarring and biapical bulla. Electronically Signed   By: Kellie Simmering D.O.   On: 12/26/2020 13:33   ECHOCARDIOGRAM COMPLETE  Result Date: 12/27/2020    ECHOCARDIOGRAM REPORT   Patient Name:   Joe Hill Date of Exam: 12/27/2020 Medical Rec #:  [id number]  Height:       70.0 in Accession #:    [id number]     Weight:       173.5 lb Date of Birth:  [birth year]      BSA:          1.965 m Patient Age:    61 years       BP:           106/73 mmHg Patient Gender: M              HR:           81 bpm. Exam Location:  Inpatient Procedure: 2D Echo, Cardiac Doppler and Color Doppler Indications:    Pulmonary Embolus I26.09  History:        Patient has no prior history of Echocardiogram examinations.                 COPD; Risk Factors:Hypertension.  Sonographer:    Bernadene Person RDCS Referring Phys: [id number] [place]  1. Left ventricular ejection fraction, by estimation, is 40 to 45%. The left ventricle has mildly decreased function. The left ventricle demonstrates global hypokinesis. The left ventricular internal cavity size was mildly dilated. Left ventricular diastolic function could not be evaluated.  2. Right ventricular systolic function is normal. The right ventricular size is normal. There is normal pulmonary artery  systolic pressure.  3. Left atrial size was mildly dilated.  4. Right atrial size was mildly dilated.  5. The mitral valve is normal in structure. Trivial mitral valve regurgitation.  6. The aortic valve is calcified. There is mild calcification of the aortic valve. There is mild thickening of the aortic valve. Aortic valve regurgitation is mild to moderate.  7. Aortic dilatation noted. There is borderline dilatation of the ascending aorta, measuring 39 mm. FINDINGS  Left Ventricle: Left ventricular ejection fraction, by estimation, is 40 to 45%. The left ventricle has mildly decreased function. The left ventricle demonstrates global hypokinesis. The left ventricular internal cavity size was mildly dilated. There is  no left ventricular hypertrophy. Left ventricular diastolic function could not be evaluated due to atrial fibrillation. Left ventricular diastolic function could not be evaluated. Right Ventricle: The right ventricular size is normal. Right vetricular wall thickness was not well visualized. Right ventricular systolic function is normal. There is normal pulmonary artery systolic pressure. The tricuspid regurgitant velocity is 2.18 m/s, and with an assumed right atrial pressure of 3 mmHg, the estimated right ventricular systolic pressure is 85.8 mmHg. Left Atrium: Left atrial size was mildly dilated. Right Atrium: Right atrial size was mildly dilated. Pericardium: There is no evidence of pericardial effusion. Mitral Valve: The mitral valve is normal in structure. Trivial mitral valve regurgitation. Tricuspid Valve: The tricuspid valve is grossly normal. Tricuspid valve regurgitation is trivial. Aortic Valve: The aortic valve is calcified. There is mild calcification of the aortic valve. There is mild thickening of the aortic valve. There is mild aortic valve annular calcification. Aortic valve regurgitation is mild to moderate. Aortic regurgitation PHT measures 326 msec. Pulmonic Valve: The pulmonic valve  was not well visualized. Pulmonic valve regurgitation is not visualized. Aorta: Aortic dilatation noted. There is borderline dilatation of the ascending aorta, measuring 39 mm. IAS/Shunts: The atrial septum is grossly normal.  LEFT VENTRICLE PLAX 2D LVIDd:         5.00 cm LVIDs:         4.20 cm LV PW:  1.00 cm LV IVS:        1.00 cm LVOT diam:     2.20 cm LV SV:         69 LV SV Index:   35 LVOT Area:     3.80 cm  LV Volumes (MOD) LV vol d, MOD A2C: 126.0 ml LV vol d, MOD A4C: 96.6 ml LV vol s, MOD A2C: 70.9 ml LV vol s, MOD A4C: 58.1 ml LV SV MOD A2C:     55.1 ml LV SV MOD A4C:     96.6 ml LV SV MOD BP:      49.7 ml RIGHT VENTRICLE TAPSE (M-mode): 1.7 cm LEFT ATRIUM             Index        RIGHT ATRIUM           Index LA diam:        4.60 cm 2.34 cm/m   RA Area:     19.10 cm LA Vol (A2C):   62.9 ml 32.01 ml/m  RA Volume:   56.70 ml  28.86 ml/m LA Vol (A4C):   57.7 ml 29.36 ml/m LA Biplane Vol: 66.3 ml 33.74 ml/m  AORTIC VALVE LVOT Vmax:   99.30 cm/s LVOT Vmean:  66.200 cm/s LVOT VTI:    0.182 m AI PHT:      326 msec  AORTA Ao Root diam: 3.90 cm Ao Asc diam:  3.90 cm TRICUSPID VALVE TR Peak grad:   19.0 mmHg TR Vmax:        218.00 cm/s  SHUNTS Systemic VTI:  0.18 m Systemic Diam: 2.20 cm Mertie Moores MD Electronically signed by Mertie Moores MD Signature Date/Time: 12/27/2020/4:31:49 PM    Final         Scheduled Meds:  apixaban  5 mg Oral BID   diltiazem  240 mg Oral Daily   feeding supplement  237 mL Oral BID BM   folic acid  1 mg Oral Daily   pantoprazole  40 mg Oral Daily   potassium chloride  40 mEq Oral BID   Continuous Infusions:  sodium chloride 75 mL/hr at 12/28/20 1038   azithromycin 500 mg (12/27/20 1623)   cefTRIAXone (ROCEPHIN)  IV 1 g (12/27/20 1329)     LOS: 1 day        Hosie Poisson, MD [facility]   To contact the attending provider between 7A-7P or the covering provider during after hours 7P-7A, please log into the web site [email or web address] and  access using universal [place] password for that web site. If you do not have the password, please call the hospital operator.  12/28/2020, 1:09 PM

## 2020-12-28 NOTE — Progress Notes (Signed)
Pt started nose bleed around 0200. Notified to a pharmacist and hospitalist. Hold heparin drip for 1 hour and then will continue to infusion at 11 ml/hr per order. Nose bleed stopped.

## 2020-12-28 NOTE — Evaluation (Signed)
Occupational Therapy Evaluation Patient Details Name: Joe Hill MRN: [id number] DOB: 1941-01-12 Today's Date: 12/28/2020   History of Present Illness 80 y.o. male adm with syncope,  CTA chest shows small right lower lobe pulmonary emboli . 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  Sternal fracture favored to be subacute.     PMH: bilateral adrenal mets, colon polyps, COPD/emphysema, GERD, hyperglycemia, hypertension, tobacco use, prostate cancer.   Clinical Impression   Joe Hill is an 80 year old man who demonstrates the ability to perform BADLs and ambulate in hall without DME but pushing an IV pole. He had no overt loss of balance. Min guard provided for safety due to being on blood thinner. Patient exhibits no overt deficits. No OT needs at this time.      Recommendations for follow up therapy are one component of a multi-disciplinary discharge planning process, led by the attending physician.  Recommendations may be updated based on patient status, additional functional criteria and insurance authorization.   Follow Up Recommendations  No OT follow up    Assistance Recommended at Discharge None  Functional Status Assessment  Patient has not had a recent decline in their functional status  Equipment Recommendations  None recommended by OT    Recommendations for Other Services       Precautions / Restrictions Precautions Precautions: Fall Precaution Comments: on Heparin Restrictions Weight Bearing Restrictions: No      Mobility Bed Mobility Overal bed mobility: Needs Assistance Bed Mobility: Supine to Sit     Supine to sit: Supervision     General bed mobility comments: OOB    Transfers Overall transfer level: Needs assistance Equipment used: None Transfers: Sit to/from Stand Sit to Stand: Min assist           General transfer comment: Ambulated in hall pushing IV pole approx 200 feet. No  overt loss of balance. Min guard provided for safety as patient on Heparin.      Balance Overall balance assessment: Mild deficits observed, not formally tested;History of Falls Sitting-balance support: Feet supported;Bilateral upper extremity supported Sitting balance-Leahy Scale: Good     Standing balance support: No upper extremity supported Standing balance-Leahy Scale: Fair                             ADL either performed or assessed with clinical judgement   ADL Overall ADL's : Independent                                       General ADL Comments: Independent with ADLs - only lmited by IV pole.     Vision Patient Visual Report: No change from baseline       Perception     Praxis      Pertinent Vitals/Pain Pain Assessment: Faces Faces Pain Scale: Hurts even more Pain Location: Left side ribs Pain Descriptors / Indicators: Grimacing;Aching Pain Intervention(s): Monitored during session     Hand Dominance     Extremity/Trunk Assessment Upper Extremity Assessment Upper Extremity Assessment: Overall WFL for tasks assessed   Lower Extremity Assessment Lower Extremity Assessment: Overall WFL for tasks assessed   Cervical / Trunk Assessment Cervical / Trunk Assessment: Normal   Communication Communication Communication: No difficulties   Cognition Arousal/Alertness: Awake/alert Behavior During Therapy: WFL for tasks assessed/performed  Overall Cognitive Status: Within Functional Limits for tasks assessed                                       General Comments       Exercises     Shoulder Instructions      Home Living Family/patient expects to be discharged to:: Private residence Living Arrangements: Alone   Type of Home: House                       Home Equipment: None          Prior Functioning/Environment Prior Level of Function : Independent/Modified  Independent;Working/employed;Driving             Mobility Comments: independent. works full time          OT Problem List: Pain      OT Treatment/Interventions:      OT Goals(Current goals can be found in the care plan section) Acute Rehab OT Goals OT Goal Formulation: All assessment and education complete, DC therapy  OT Frequency:     Barriers to D/C:            Co-evaluation              AM-PAC OT "6 Clicks" Daily Activity     Outcome Measure Help from another person eating meals?: None Help from another person taking care of personal grooming?: None Help from another person toileting, which includes using toliet, bedpan, or urinal?: None Help from another person bathing (including washing, rinsing, drying)?: None Help from another person to put on and taking off regular upper body clothing?: None Help from another person to put on and taking off regular lower body clothing?: None 6 Click Score: 24   End of Session Nurse Communication: Mobility status  Activity Tolerance: Patient tolerated treatment well Patient left: in chair;with call bell/phone within reach;with chair alarm set  OT Visit Diagnosis: Unsteadiness on feet (R26.81)                Time: 1355-1406 OT Time Calculation (min): 11 min Charges:  OT General Charges $OT Visit: 1 Visit OT Evaluation $OT Eval Low Complexity: 1 Low  Jadon Ressler, OTR/L [place]  Office [phone] Pager: [place] 12/28/2020, 3:52 PM

## 2020-12-28 NOTE — Evaluation (Signed)
Physical Therapy Evaluation Patient Details Name: Joe Hill MRN: [id number] DOB: [birth year] Today's Date: 12/28/2020  History of Present Illness  80 y.o. male adm with syncope,  CTA chest shows small right lower lobe pulmonary emboli . 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  Sternal fracture favored to be subacute.     PMH: bilateral adrenal mets, colon polyps, COPD/emphysema, GERD, hyperglycemia, hypertension, tobacco use, prostate cancer.  Clinical Impression  Pt admitted with above diagnosis.  Pt  with higher level balance deficits, fatigues fairly rapidly today although very motivated to mobilize. May benefit from [place]. SpO2=98-100% on RA   Pt currently with functional limitations due to the deficits listed below (see PT Problem List). Pt will benefit from skilled PT to increase their independence and safety with mobility to allow discharge to the venue listed below.          Recommendations for follow up therapy are one component of a multi-disciplinary discharge planning process, led by the attending physician.  Recommendations may be updated based on patient status, additional functional criteria and insurance authorization.  Follow Up Recommendations Outpatient PT    Assistance Recommended at Discharge Intermittent Supervision/Assistance  Functional Status Assessment Patient has had a recent decline in their functional status and demonstrates the ability to make significant improvements in function in a reasonable and predictable amount of time.  Equipment Recommendations       Recommendations for Other Services       Precautions / Restrictions Precautions Precautions: Fall Restrictions Weight Bearing Restrictions: No      Mobility  Bed Mobility Overal bed mobility: Needs Assistance Bed Mobility: Supine to Sit     Supine to sit: Supervision     General bed mobility comments: for safety     Transfers Overall transfer level: Needs assistance Equipment used: Rolling walker (2 wheels) Transfers: Sit to/from Stand Sit to Stand: Min assist           General transfer comment: unsteady on initial standing, min for safety and balance    Ambulation/Gait Ambulation/Gait assistance: Min assist;Min guard Gait Distance (Feet): 110 Feet Assistive device: None;IV Pole Gait Pattern/deviations: Step-through pattern;Trunk flexed;Narrow base of support;Decreased stride length       General Gait Details: unsteady gait without overt LOB, stability improved with distance. cues for posture and incr BOS. HR 90 to max 150. SpO2=98-100%  Stairs            Wheelchair Mobility    Modified Rankin (Stroke Patients Only)       Balance Overall balance assessment: Needs assistance Sitting-balance support: Feet supported;Bilateral upper extremity supported Sitting balance-Leahy Scale: Good     Standing balance support: No upper extremity supported Standing balance-Leahy Scale: Fair                               Pertinent Vitals/Pain Pain Assessment: No/denies pain    Home Living Family/patient expects to be discharged to:: Private residence Living Arrangements: Alone   Type of Home: House           Home Equipment: None      Prior Function Prior Level of Function : Independent/Modified Independent;Working/employed;Driving             Mobility Comments: independent. works full time       Journalist, newspaper        Extremity/Trunk Assessment   Upper Extremity Assessment Upper  Extremity Assessment: Overall WFL for tasks assessed    Lower Extremity Assessment Lower Extremity Assessment: Overall WFL for tasks assessed       Communication   Communication: No difficulties  Cognition Arousal/Alertness: Awake/alert Behavior During Therapy: WFL for tasks assessed/performed Overall Cognitive Status: Within Functional Limits for tasks assessed                                           General Comments      Exercises     Assessment/Plan    PT Assessment Patient needs continued PT services  PT Problem List Decreased strength;Decreased mobility;Decreased activity tolerance;Decreased balance;Decreased knowledge of use of DME;Cardiopulmonary status limiting activity       PT Treatment Interventions DME instruction;Therapeutic activities;Gait training;Functional mobility training;Therapeutic exercise;Patient/family education;Balance training    PT Goals (Current goals can be found in the Care Plan section)  Acute Rehab PT Goals Patient Stated Goal: to get back to work PT Goal Formulation: With patient Time For Goal Achievement: 01/11/21 Potential to Achieve Goals: Good    Frequency Min 3X/week   Barriers to discharge        Co-evaluation               AM-PAC PT "6 Clicks" Mobility  Outcome Measure Help needed turning from your back to your side while in a flat bed without using bedrails?: None Help needed moving from lying on your back to sitting on the side of a flat bed without using bedrails?: A Little Help needed moving to and from a bed to a chair (including a wheelchair)?: A Little Help needed standing up from a chair using your arms (e.g., wheelchair or bedside chair)?: A Little Help needed to walk in hospital room?: A Little Help needed climbing 3-5 steps with a railing? : A Little 6 Click Score: 19    End of Session Equipment Utilized During Treatment: Gait belt Activity Tolerance: Patient tolerated treatment well Patient left: in chair;with call bell/phone within reach;with chair alarm set Nurse Communication: Mobility status PT Visit Diagnosis: Difficulty in walking, not elsewhere classified (R26.2)    Time: 1010-1028 PT Time Calculation (min) (ACUTE ONLY): 18 min   Charges:   PT Evaluation $PT Eval Low Complexity: [facility], PT  Acute Rehab Dept ([place])  [phone] Pager [phone]  12/28/2020   [facility] 12/28/2020, 12:45 PM

## 2020-12-29 DIAGNOSIS — C3492 Malignant neoplasm of unspecified part of left bronchus or lung: Secondary | ICD-10-CM | POA: Diagnosis not present

## 2020-12-29 DIAGNOSIS — D701 Agranulocytosis secondary to cancer chemotherapy: Secondary | ICD-10-CM

## 2020-12-29 DIAGNOSIS — D61818 Other pancytopenia: Secondary | ICD-10-CM | POA: Diagnosis not present

## 2020-12-29 LAB — CBC WITH DIFFERENTIAL/PLATELET
Abs Immature Granulocytes: 0.01 10*3/uL (ref 0.00–0.07)
Basophils Absolute: 0 10*3/uL (ref 0.0–0.1)
Basophils Relative: 0 %
Eosinophils Absolute: 0 10*3/uL (ref 0.0–0.5)
Eosinophils Relative: 1 %
HCT: 24 % — ABNORMAL LOW (ref 39.0–52.0)
Hemoglobin: 8.3 g/dL — ABNORMAL LOW (ref 13.0–17.0)
Immature Granulocytes: 1 %
Lymphocytes Relative: 59 %
Lymphs Abs: 0.5 10*3/uL — ABNORMAL LOW (ref 0.7–4.0)
MCH: 31.7 pg (ref 26.0–34.0)
MCHC: 34.6 g/dL (ref 30.0–36.0)
MCV: 91.6 fL (ref 80.0–100.0)
Monocytes Absolute: 0.1 10*3/uL (ref 0.1–1.0)
Monocytes Relative: 11 %
Neutro Abs: 0.2 10*3/uL — CL (ref 1.7–7.7)
Neutrophils Relative %: 28 %
Platelets: 30 10*3/uL — ABNORMAL LOW (ref 150–400)
RBC: 2.62 MIL/uL — ABNORMAL LOW (ref 4.22–5.81)
RDW: 13.3 % (ref 11.5–15.5)
WBC: 0.8 10*3/uL — CL (ref 4.0–10.5)
nRBC: 0 % (ref 0.0–0.2)

## 2020-12-29 LAB — BASIC METABOLIC PANEL
Anion gap: 5 (ref 5–15)
BUN: 16 mg/dL (ref 8–23)
CO2: 24 mmol/L (ref 22–32)
Calcium: 8 mg/dL — ABNORMAL LOW (ref 8.9–10.3)
Chloride: 108 mmol/L (ref 98–111)
Creatinine, Ser: 0.86 mg/dL (ref 0.61–1.24)
GFR, Estimated: >60 mL/min (ref >60)
Glucose, Bld: 95 mg/dL (ref 70–99)
Potassium: 3.7 mmol/L (ref 3.5–5.1)
Sodium: 137 mmol/L (ref 135–145)

## 2020-12-29 LAB — LEGIONELLA PNEUMOPHILA SEROGP 1 UR AG: L. pneumophila Serogp 1 Ur Ag: NEGATIVE

## 2020-12-29 MED ORDER — AZITHROMYCIN 250 MG PO TABS
500.0000 mg | ORAL_TABLET | Freq: Every day | ORAL | Status: AC
  Administered 2020-12-29 – 2020-12-30 (×2): 500 mg via ORAL
  Filled 2020-12-29 (×2): qty 2

## 2020-12-29 MED ORDER — TBO-FILGRASTIM 480 MCG/0.8ML ~~LOC~~ SOSY
480.0000 ug | PREFILLED_SYRINGE | Freq: Every day | SUBCUTANEOUS | Status: DC
  Administered 2020-12-29 – 2021-01-01 (×4): 480 ug via SUBCUTANEOUS
  Filled 2020-12-29 (×4): qty 0.8

## 2020-12-29 NOTE — Progress Notes (Signed)
HEMATOLOGY-ONCOLOGY PROGRESS NOTE  SUBJECTIVE: Joe Hill is followed by our office for stage IV non-small cell lung cancer, adenocarcinoma.  He is currently receiving systemic chemotherapy with carboplatin for an AUC of 5, Alimta 500 mg per metered squared, and Keytruda 200 mg IV every 3 weeks.  Status post 2 cycles.  Now admitted following a syncopal episode.  He is being treated for pneumonia.  He reports that he is feeling much better today.  He had a small amount of epistaxis earlier today which resolved quickly.  Otherwise, no bleeding.  Afebrile.  He has no other specific complaints today.  Oncology History  Primary adenocarcinoma of upper lobe of left lung ([place])  11/05/2020 Initial Diagnosis   Primary adenocarcinoma of upper lobe of left lung ([place])   11/18/2020 Cancer Staging   Staging form: Lung, AJCC 8th Edition - Clinical: Stage IVA Joe Hill, cN3, cM1b) - Signed by Curt Bears, MD on 11/18/2020    11/27/2020 -  Chemotherapy   Patient is on Treatment Plan : LUNG CARBOplatin / Pemetrexed / Pembrolizumab q21d Induction x 4 cycles / Maintenance Pemetrexed + Pembrolizumab        REVIEW OF SYSTEMS:   Constitutional: Denies fevers, chills Eyes: Denies blurriness of vision Ears, nose, mouth, throat, and face: Denies mucositis or sore throat Respiratory: Denies cough, dyspnea or wheezes Cardiovascular: Denies palpitation, chest discomfort Gastrointestinal:  Denies nausea, heartburn or change in bowel habits Skin: Denies abnormal skin rashes Lymphatics: Denies new lymphadenopathy  Neurological:Denies numbness, tingling or new weaknesses Behavioral/Psych: Mood is stable, no new changes  Extremities: No lower extremity edema All other systems were reviewed with the patient and are negative.  I have reviewed the past medical history, past surgical history, social history and family history with the patient and they are unchanged from previous note.   PHYSICAL EXAMINATION: ECOG  PERFORMANCE STATUS: 1 - Symptomatic but completely ambulatory  Vitals:   12/29/20 0531 12/29/20 1021  BP: 129/89 116/65  Pulse: 95   Resp: 15   Temp: 97.6 F (36.4 C)   SpO2: 99%    Filed Weights   12/26/20 1721  Weight: 78.7 kg    Intake/Output from previous day: 12/04 0701 - 12/05 0700 In: 2137.2 [I.V.:1787.2; IV Piggyback:350] Out: 8768 [Urine:1125]  GENERAL:alert, no distress and comfortable SKIN: skin color, texture, turgor are normal, no rashes or significant lesions EYES: normal, Conjunctiva are pink and non-injected, sclera clear LUNGS: clear to auscultation and percussion with normal breathing effort HEART: regular rate & rhythm and no murmurs and no lower extremity edema ABDOMEN:abdomen soft, non-tender and normal bowel sounds NEURO: alert & oriented x 3 with fluent speech, no focal motor/sensory deficits  LABORATORY DATA:  I have reviewed the data as listed CMP Latest Ref Rng & Units 12/29/2020 12/28/2020 12/27/2020  Glucose 70 - 99 mg/dL 95 115(H) 109(H)  BUN 8 - 23 mg/dL 16 25(H) 37(H)  Creatinine 0.61 - 1.24 mg/dL 0.86 0.93 0.93  Sodium 135 - 145 mmol/L 137 136 137  Potassium 3.5 - 5.1 mmol/L 3.7 3.1(L) 3.6  Chloride 98 - 111 mmol/L 108 108 109  CO2 22 - 32 mmol/L _0 Calcium 8.9 - 10.3 mg/dL 8.0(L) 7.6(L) 8.0(L)  Total Protein 6.5 - 8.1 g/dL - - 5.8(L)  Total Bilirubin 0.3 - 1.2 mg/dL - - 1.0  Alkaline Phos 38 - 126 U/L - - 84  AST 15 - 41 U/L - - 31  ALT 0 - 44 U/L - - 49(H)  Lab Results  Component Value Date   WBC 0.8 (LL) 12/29/2020   HGB 8.3 (L) 12/29/2020   HCT 24.0 (L) 12/29/2020   MCV 91.6 12/29/2020   PLT 30 (L) 12/29/2020   NEUTROABS 0.2 (LL) 12/29/2020    DG Chest 2 View  Result Date: 12/24/2020 CLINICAL DATA:  Shortness of breath, difficulty breathing EXAM: CHEST - 2 VIEW COMPARISON:  Chest radiograph 12/15/2020 FINDINGS: The cardiomediastinal silhouette is stable. Increased interstitial markings are seen throughout both lungs  likely reflecting chronic changes on a background of emphysema as seen on prior CT chest. Patchy opacities in the left base are unchanged. There is no new or worsening focal airspace disease. The known left upper lobe nodule is not well assessed on the current study. There is no pleural effusion or pneumothorax. There is no acute osseous abnormality. IMPRESSION: Patchy opacities in the left base, not significantly changed and again favored to reflect atelectasis. No new or worsening airspace disease. Electronically Signed   By: Valetta Mole M.D.   On: 12/24/2020 11:49   DG Chest 2 View  Result Date: 12/15/2020 CLINICAL DATA:  Cough and congestion EXAM: CHEST - 2 VIEW COMPARISON:  Chest x-ray dated October 28, 2020 FINDINGS: Cardiac and mediastinal contours within normal limits. Unchanged left upper lobe mass. New mild bibasilar opacities. No pleural effusion or pneumothorax. Inferior right costophrenic angle is excluded from the field of view. IMPRESSION: 1. New mild bibasilar opacities differential includes atelectasis, aspiration or infection. 2. Left upper lobe mass, similar to prior radiograph. Electronically Signed   By: Yetta Glassman M.D.   On: 12/15/2020 15:50   DG Ribs Unilateral W/Chest Left  Result Date: 12/26/2020 CLINICAL DATA:  Fall, left anterior rib pain EXAM: LEFT RIBS AND CHEST - 3+ VIEW COMPARISON:  Chest radiographs and CT chest, 12/24/2020 FINDINGS: No displaced fracture or other bone lesions are seen involving the ribs. There is no evidence of pneumothorax or pleural effusion. Heterogeneous airspace opacity of the left lung base. Unchanged elevation of the left hemidiaphragm. Heart size and mediastinal contours are within normal limits. IMPRESSION: 1. No displaced rib fracture. 2. Heterogeneous airspace opacity of the left lung base, consistent with infection or aspiration, as seen on prior. Electronically Signed   By: Delanna Ahmadi M.D.   On: 12/26/2020 12:14   CT Head Wo  Contrast  Result Date: 12/26/2020 CLINICAL DATA:  Neck trauma. Head trauma, minor. Additional history provided: Unwitnessed syncopal episode and fall. Patient reports left rib pain, difficulty taking deep breaths. EXAM: CT HEAD WITHOUT CONTRAST CT CERVICAL SPINE WITHOUT CONTRAST TECHNIQUE: Multidetector CT imaging of the head and cervical spine was performed following the standard protocol without intravenous contrast. Multiplanar CT image reconstructions of the cervical spine were also generated. COMPARISON:  Brain MRI 10/09/2020. FINDINGS: CT HEAD FINDINGS Brain: Mild-to-moderate generalized cerebral atrophy. Comparatively mild cerebellar atrophy. 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 Background moderate/advanced patchy and ill-defined hypoattenuation within the cerebral white matter, nonspecific but compatible with chronic small vessel ischemic disease. There is no acute intracranial hemorrhage. No demarcated cortical infarct. No extra-axial fluid collection. No evidence of an intracranial mass. No midline shift. Vascular: No hyperdense vessel.  Atherosclerotic calcifications. Skull: Normal. Negative for fracture or focal lesion. Sinuses/Orbits: Visualized orbits show no acute finding. Mild mucosal thickening within the bilateral ethmoid and maxillary sinuses at the imaged levels. CT CERVICAL  SPINE FINDINGS Alignment: Straightening of the expected cervical lordosis. No significant spondylolisthesis. Skull base and vertebrae: The basion-dental and atlanto-dental intervals are maintained.No evidence of acute fracture to the cervical spine. Soft tissues and spinal canal: No prevertebral fluid or swelling. No visible canal hematoma. Disc levels: Cervical spondylosis with multilevel disc space narrowing, disc bulges, posterior disc osteophytes, endplate spurring,  uncovertebral hypertrophy and facet arthrosis. Disc space narrowing is greatest at C5-C6, C6-C7 and T2-T3 (moderate at these levels). No appreciable levels of severe spinal canal stenosis. Multilevel bony neural foraminal narrowing. Multilevel ventral osteophytes. Upper chest: No consolidation within the imaged lung apices. No visible pneumothorax. Centrilobular and paraseptal emphysema with biapical bulla. Biapical pleuroparenchymal scarring. IMPRESSION: CT head: 1. No evidence of acute intracranial abnormality. 2. 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 3. Mild paranasal sinus disease at the imaged levels. CT cervical spine: 1. No evidence of acute fracture to the cervical spine. 2. Straightening of the expected cervical lordosis. 3. Cervical spondylosis, as described. 4. Emphysema (ICD10-J43.9). Associated biapical pleuroparenchymal scarring and biapical bulla. Electronically Signed   By: Kellie Simmering D.O.   On: 12/26/2020 13:33   CT Chest W Contrast  Addendum Date: 12/27/2020   ADDENDUM REPORT: 12/27/2020 10:07 ADDENDUM: Upon reassessing imaging there is considerable artifact passing through the RIGHT lower chest and this study is venous phase. Despite images that show clear intraluminal low attenuation and high suspicion for pulmonary embolism there is question due to the artifact seen in this area. Furthermore, to help determine best future management in this complex patient would suggest a study performed in the appropriate phase, CT PE protocol with the arms up if possible. These results were called by telephone at the time of interpretation on 12/27/2020 at 10:07 am to provider Hosie Poisson MD, who verbally acknowledged these results. Electronically Signed   By: Zetta Bills M.D.   On: 12/27/2020 10:07   Result Date: 12/27/2020 CLINICAL DATA:  History of trauma, unwitnessed syncopal  episode in an 80 year old male with history of pulmonary neoplasm. EXAM: CT CHEST WITH CONTRAST TECHNIQUE: Multidetector CT imaging of the chest was performed during intravenous contrast administration. CONTRAST:  32m OMNIPAQUE IOHEXOL 350 MG/ML SOLN COMPARISON:  December 24, 2020 chest CT. FINDINGS: Cardiovascular: The aorta is normal caliber. Central pulmonary vasculature is normal caliber. Filling defects in the RIGHT lower lobe segmental and subsegmental branches to posterior basal segment (image 126/3) this is in an area of beam hardening artifact. The study is a venous phase evaluation not performed for PE assessment. No additional areas are demonstrated. Heart size is normal without pericardial effusion. There is straightening of the interventricular septum that was present previously. Mediastinum/Nodes: Interval decrease in size of proximally 2 cm LEFT paramediastinal mass contiguous with AP window soft tissue is stable in the short interval. Small lymph nodes throughout the remainder of the mediastinum likewise are unchanged. Lungs/Pleura: LEFT lower lobe nodularity and airspace process showing no change in the short interval. Signs of pulmonary emphysema and LEFT upper lobe nodule as discussed. Nodular changes have developed in the RIGHT lower lobe, multifocal nodularity since the previous study. Stable background subsolid nodule (image 119/6) 13 x 15 mm, this was not present in August of 2022 nor in September and is therefore favored to represent post inflammatory process. There is mild septal thickening in the RIGHT upper lobe that is new from recent imaging. Mild bronchial wall thickening is present  throughout the chest. Upper Abdomen: Incidental imaging of upper abdominal contents without acute process. 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 No acute upper abdominal process. Musculoskeletal: Signs of  subacute sternal fracture, sclerotic margins without change, no substantial displacement. Spinal degenerative changes. Costochondral elements are intact. No signs of displaced rib fracture. Visualized clavicles and scapulae are intact. IMPRESSION: Small RIGHT lower lobe pulmonary emboli. Straightening of the interventricular septum is a stable finding and may relate to baseline elevated RIGHT heart pressures. Echocardiographic correlation may be helpful as warranted. Worsening of multifocal pneumonia. Signs of primary lung cancer in the LEFT chest with mediastinal involvement as described previously. 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 Sternal fracture favored to be subacute is new since September of 2022. Not substantially changed since December 24, 2020. Aortic Atherosclerosis (ICD10-I70.0) and Emphysema (ICD10-J43.9). Critical Value/emergent results were called by telephone at the time of interpretation on 12/26/2020 at 1:42 pm to provider [facility] , who verbally acknowledged these results. Electronically Signed: By: Zetta Bills M.D. On: 12/26/2020 13:43   CT Angio Chest Pulmonary Embolism (PE) W or WO Contrast  Result Date: 12/27/2020 CLINICAL DATA:  PE suspected, high probability. Possible PE versus artifact identified incidentally on a CT scan of the chest with contrast from yesterday. EXAM: CT ANGIOGRAPHY CHEST WITH CONTRAST TECHNIQUE: Multidetector CT imaging of the chest was performed using the standard protocol during bolus administration of intravenous contrast. Multiplanar CT image reconstructions and MIPs were obtained to evaluate the vascular anatomy. CONTRAST:  76m OMNIPAQUE IOHEXOL 350 MG/ML SOLN COMPARISON:  CT scan of the chest 12/26/2020 FINDINGS: Cardiovascular: Satisfactory opacification of the pulmonary arteries to the segmental level. No evidence of pulmonary embolism. Normal heart size. The previously questioned pulmonary emboli are not evident on  today's exam and almost certainly represented focal beam hardening artifact. No pericardial effusion. Aortic and coronary artery atherosclerotic calcifications. Mediastinum/Nodes: Stable left paramediastinal mass measuring approximately 2 cm in contiguous with soft tissue in the AP window. Additionally, small scattered mediastinal lymph nodes are also unchanged compared to recent prior imaging. Lungs/Pleura: Continued stability of nonspecific left lower lobe nodularity and left upper lobe pulmonary nodule. Stable appearance of multifocal airspace opacifications in the right lower lobe. Background of moderately severe centrilobular pulmonary emphysema. Slightly increased right lower lobe atelectasis. No pleural effusion or pneumothorax. Overall, unchanged compared to yesterday. Upper Abdomen: No acute abnormality. Musculoskeletal: No acute abnormality. Review of the MIP images confirms the above findings. IMPRESSION: 1. Dedicated CTA imaging confirms that the previously suspected right lower lobe segmental pulmonary emboli were in fact artifactual. 2. Otherwise, unchanged appearance of multifocal pneumonia and primary left chest lung cancer with mediastinal involvement. Aortic Atherosclerosis (ICD10-I70.0) and Emphysema (ICD10-J43.9). Electronically Signed   By: HJacqulynn CadetM.D.   On: 12/27/2020 13:38   CT Angio Chest PE W and/or Wo Contrast  Result Date: 12/24/2020 CLINICAL DATA:  Difficulty breathing.  History of lung cancer. EXAM: CT ANGIOGRAPHY CHEST WITH CONTRAST TECHNIQUE: Multidetector CT imaging of the chest was performed using the standard protocol during bolus administration of intravenous contrast. Multiplanar CT image reconstructions and MIPs were obtained to evaluate the vascular anatomy. CONTRAST:  774mOMNIPAQUE IOHEXOL 350 MG/ML SOLN COMPARISON:  October 15, 2020.  October 15, 2013. FINDINGS: Cardiovascular: Satisfactory opacification of the pulmonary arteries to the segmental level. No  evidence of pulmonary embolism. Normal heart size. No pericardial effusion. Mild coronary  artery calcifications are noted. Mediastinum/Nodes: Thyroid gland and esophagus are unremarkable. 4.7 x 2.2 cm adenopathy is noted in aortopulmonary window. Lungs/Pleura: Emphysematous disease is noted. No pneumothorax or pleural effusion is noted. Patchy airspace opacities are noted in the left lower lobe concerning for pneumonia. 2.2 x 2.0 cm mass is noted anteriorly in the left upper lobe which does not appear to be significantly changed compared to prior exam and is concerning for malignancy. Several small ill-defined opacities are noted in the right lower lobe which may represent focal inflammation. Upper Abdomen: No acute abnormality. Musculoskeletal: No chest wall abnormality. No acute or significant osseous findings. Review of the MIP images confirms the above findings. IMPRESSION: No definite evidence of pulmonary embolus. New patchy airspace opacities are noted in the left lower lobe and to a lesser degree in the right lower lobe, concerning for multifocal pneumonia. 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 Aortic Atherosclerosis (ICD10-I70.0) and Emphysema (ICD10-J43.9). Electronically Signed   By: Marijo Conception M.D.   On: 12/24/2020 16:50   CT Cervical Spine Wo Contrast  Result Date: 12/26/2020 CLINICAL DATA:  Neck trauma. Head trauma, minor. Additional history provided: Unwitnessed syncopal episode and fall. Patient reports left rib pain, difficulty taking deep breaths. EXAM: CT HEAD WITHOUT CONTRAST CT CERVICAL SPINE WITHOUT CONTRAST TECHNIQUE: Multidetector CT imaging of the head and cervical spine was performed following the standard protocol without intravenous contrast. Multiplanar CT image reconstructions of the cervical spine were also generated. COMPARISON:  Brain MRI  10/09/2020. FINDINGS: CT HEAD FINDINGS Brain: Mild-to-moderate generalized cerebral atrophy. Comparatively mild cerebellar atrophy. 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 Background moderate/advanced patchy and ill-defined hypoattenuation within the cerebral white matter, nonspecific but compatible with chronic small vessel ischemic disease. There is no acute intracranial hemorrhage. No demarcated cortical infarct. No extra-axial fluid collection. No evidence of an intracranial mass. No midline shift. Vascular: No hyperdense vessel.  Atherosclerotic calcifications. Skull: Normal. Negative for fracture or focal lesion. Sinuses/Orbits: Visualized orbits show no acute finding. Mild mucosal thickening within the bilateral ethmoid and maxillary sinuses at the imaged levels. CT CERVICAL SPINE FINDINGS Alignment: Straightening of the expected cervical lordosis. No significant spondylolisthesis. Skull base and vertebrae: The basion-dental and atlanto-dental intervals are maintained.No evidence of acute fracture to the cervical spine. Soft tissues and spinal canal: No prevertebral fluid or swelling. No visible canal hematoma. Disc levels: Cervical spondylosis with multilevel disc space narrowing, disc bulges, posterior disc osteophytes, endplate spurring, uncovertebral hypertrophy and facet arthrosis. Disc space narrowing is greatest at C5-C6, C6-C7 and T2-T3 (moderate at these levels). No appreciable levels of severe spinal canal stenosis. Multilevel bony neural foraminal narrowing. Multilevel ventral osteophytes. Upper chest: No consolidation within the imaged lung apices. No visible pneumothorax. Centrilobular and paraseptal emphysema with biapical bulla. Biapical pleuroparenchymal scarring. IMPRESSION: CT head: 1. No evidence of acute intracranial abnormality. 2.  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 3. Mild paranasal sinus disease at the imaged levels. CT cervical spine: 1. No evidence of acute fracture to the cervical spine. 2. Straightening of the expected cervical lordosis. 3. Cervical spondylosis, as described. 4. Emphysema (ICD10-J43.9). Associated biapical pleuroparenchymal scarring and biapical bulla.  Electronically Signed   By: Kellie Simmering D.O.   On: 12/26/2020 13:33   ECHOCARDIOGRAM COMPLETE  Result Date: 12/27/2020    ECHOCARDIOGRAM REPORT   Patient Name:   Joe Hill Date of Exam: 12/27/2020 Medical Rec #:  [id number]      Height:       70.0 in Accession #:    [id number]     Weight:       173.5 lb Date of Birth:  December 06, 1940      BSA:          1.965 m Patient Age:    43 years       BP:           106/73 mmHg Patient Gender: M              HR:           81 bpm. Exam Location:  Inpatient Procedure: 2D Echo, Cardiac Doppler and Color Doppler Indications:    Pulmonary Embolus I26.09  History:        Patient has no prior history of Echocardiogram examinations.                 COPD; Risk Factors:Hypertension.  Sonographer:    Bernadene Person RDCS Referring Phys: [id number] [place]  1. Left ventricular ejection fraction, by estimation, is 40 to 45%. The left ventricle has mildly decreased function. The left ventricle demonstrates global hypokinesis. The left ventricular internal cavity size was mildly dilated. Left ventricular diastolic function could not be evaluated.  2. Right ventricular systolic function is normal. The right ventricular size is normal. There is normal pulmonary artery systolic pressure.  3. Left atrial size was mildly dilated.  4. Right atrial size was mildly dilated.  5. The mitral valve is normal in structure. Trivial mitral valve regurgitation.  6. The aortic valve is calcified. There is mild calcification  of the aortic valve. There is mild thickening of the aortic valve. Aortic valve regurgitation is mild to moderate.  7. Aortic dilatation noted. There is borderline dilatation of the ascending aorta, measuring 39 mm. FINDINGS  Left Ventricle: Left ventricular ejection fraction, by estimation, is 40 to 45%. The left ventricle has mildly decreased function. The left ventricle demonstrates global hypokinesis. The left ventricular internal cavity size was mildly dilated. There is  no left ventricular hypertrophy. Left ventricular diastolic function could not be evaluated due to atrial fibrillation. Left ventricular diastolic function could not be evaluated. Right Ventricle: The right ventricular size is normal. Right vetricular wall thickness was not well visualized. Right ventricular systolic function is normal. There is normal pulmonary artery systolic pressure. The tricuspid regurgitant velocity is 2.18 m/s, and with an assumed right atrial pressure of 3 mmHg, the estimated right ventricular systolic pressure is 05.3 mmHg. Left Atrium: Left atrial size was mildly dilated. Right Atrium: Right atrial size was mildly dilated. Pericardium: There is no evidence of pericardial effusion. Mitral Valve: The mitral valve is normal in structure. Trivial mitral valve regurgitation. Tricuspid Valve: The tricuspid valve is grossly normal. Tricuspid valve regurgitation is trivial. Aortic Valve: The aortic valve is calcified. There is mild calcification of the aortic valve. There is mild thickening of the aortic valve. There is mild aortic valve annular calcification. Aortic valve regurgitation is mild to moderate. Aortic regurgitation PHT measures 326 msec. Pulmonic Valve: The pulmonic valve was not well visualized. Pulmonic valve regurgitation is not visualized. Aorta: Aortic dilatation noted. There is borderline dilatation of the  ascending aorta, measuring 39 mm. IAS/Shunts: The atrial septum is grossly normal.  LEFT VENTRICLE  PLAX 2D LVIDd:         5.00 cm LVIDs:         4.20 cm LV PW:         1.00 cm LV IVS:        1.00 cm LVOT diam:     2.20 cm LV SV:         69 LV SV Index:   35 LVOT Area:     3.80 cm  LV Volumes (MOD) LV vol d, MOD A2C: 126.0 ml LV vol d, MOD A4C: 96.6 ml LV vol s, MOD A2C: 70.9 ml LV vol s, MOD A4C: 58.1 ml LV SV MOD A2C:     55.1 ml LV SV MOD A4C:     96.6 ml LV SV MOD BP:      49.7 ml RIGHT VENTRICLE TAPSE (M-mode): 1.7 cm LEFT ATRIUM             Index        RIGHT ATRIUM           Index LA diam:        4.60 cm 2.34 cm/m   RA Area:     19.10 cm LA Vol (A2C):   62.9 ml 32.01 ml/m  RA Volume:   56.70 ml  28.86 ml/m LA Vol (A4C):   57.7 ml 29.36 ml/m LA Biplane Vol: 66.3 ml 33.74 ml/m  AORTIC VALVE LVOT Vmax:   99.30 cm/s LVOT Vmean:  66.200 cm/s LVOT VTI:    0.182 m AI PHT:      326 msec  AORTA Ao Root diam: 3.90 cm Ao Asc diam:  3.90 cm TRICUSPID VALVE TR Peak grad:   19.0 mmHg TR Vmax:        218.00 cm/s  SHUNTS Systemic VTI:  0.18 m Systemic Diam: 2.20 cm Mertie Moores MD Electronically signed by Mertie Moores MD Signature Date/Time: 12/27/2020/4:31:49 PM    Final     ASSESSMENT AND PLAN: This is a very pleasant 80 year old white male diagnosed with a stage IV (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 The patient started systemic chemotherapy with carboplatin for AUC of 5, Alimta 500 Mg/M2 and Keytruda 200 Mg IV every 3 weeks status post 2 cycles.  He is now admitted following a syncopal episode and he is being treated for pneumonia.  CBC from today has been reviewed and WBC is 0.8, ANC 0.2, hemoglobin 8.3, platelets 30,000.  We will begin Granix 480 mcg subcu daily until ANC is 1.5 or higher.  He tells me that he had a good  response to this medication following his first cycle of chemo.  Recommend PRBC transfusion for hemoglobin less than 8 or platelet count less than 20,000 or active bleeding.  If the patient has otherwise medically stable for discharge and counts remain low, we can arrange for outpatient follow-up at the cancer center for Granix injections and/or transfusions.  The patient was disappointed that he was not discharged today.   Future Appointments  Date Time Provider [place]  01/01/2021  4:00 PM [facility] None  01/08/2021  9:00 AM [facility]  None  01/08/2021  9:30 AM Curt Bears, MD [facility] None  01/08/2021 10:30 AM [facility] INFUSION [facility] None  01/15/2021  4:00 PM [facility] None  01/22/2021  4:00 PM [facility] None  01/29/2021 10:00 AM [facility] None  01/29/2021 10:30 AM Heilingoetter, Cassandra L, PA-C [facility] None  01/29/2021 11:30 AM [facility] INFUSION [facility] None  01/30/2021  4:00 PM Evans Lance, MD [facility]  02/04/2021  4:00 PM [facility] None  02/11/2021  4:00 PM [facility] None  02/18/2021 10:00 AM [facility] None  02/18/2021 10:30 AM Curt Bears, MD [facility] None  02/18/2021 11:30 AM [facility] INFUSION [facility] None  02/25/2021  4:00 PM [facility] None  03/04/2021  4:00 PM [facility] None  03/11/2021 10:00 AM [facility] None  03/11/2021 10:30 AM Heilingoetter, Cassandra L, PA-C [facility] None  03/11/2021 11:30 AM [facility] INFUSION [facility] None  05/12/2021  8:20 AM Yong Channel, Brayton Mars, MD [facility]      LOS: 2 days   Mikey Bussing, DNP, AGPCNP-BC, AOCNP 12/29/20

## 2020-12-29 NOTE — Progress Notes (Addendum)
PROGRESS NOTE    Joe Hill  OQH:[id number] DOB: [birth year] DOA: 12/26/2020 PCP: [name], MD    Chief Complaint  Patient presents with   Fall    Rib pain   Loss of Consciousness    Brief Narrative:  Joe Hill is a 80 y.o. male with medical history significant of bilateral adrenal mets, colon polyps, COPD/emphysema, GERD, hyperglycemia, hypertension, tobacco use, prostate cancer who was brought to the emergency department via EMS after having an unwitnessed syncopal episode and collapse at home hitting his left rib cage.  The patient does not remember having any prodromal symptoms before passing out and just remembered being on the floor.  CTA chest shows small right lower lobe pulmonary emboli.  There has been worsening of multifocal pneumonia.  Signs of primary lung cancer in the left chest with mediastinal involvement . He was found to be in atrial fib with RVR. H was started on IV rocephin and IV zithromax.   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  Echocardiogram ordered. On eliquis for anti coagulation.  Troponin negative.  Pt denies any chest pain or sob.    Multifocal Pneumonia/ acute respiratory failure with hypoxia initially requiring.  2 lit of [place] oxygen.   - on IV antibiotics, possible transition to augmentin for discharge.  - urine for strep pneumonia is negative.  - weaned off oxygen.    Questionable pulmonary embolism/artifact Repeat CTA study to evaluate for PE is negative for PE.     Pancytopenia:  -Possibly from chemotherapy,  oncology consulted, granix ordered.  - platelets have dropped to 30,000, transfuse to keep platelets >20,000. - hemoglobin is 8.3, transfuse to keep hemoglobin >8.  - ANC is 200,. Granix injection ordered     Stage IV (T2 a, N3, M1 B) non-small cell lung cancer, adenocarcinoma presented with large central left upper lobe lung mass in addition to bilateral mediastinal lymphadenopathy and right upper lobe metastatic nodule as well as left adrenal metastasis diagnosed in October 2022.   - Systemic chemotherapy with carboplatin for AUC of 5, Alimta 500 Mg/M2 and Keytruda 200 Mg IV every 3 weeks, status post 2 cycles, last cycle on 12/17/20. -We  will notify Dr. Julien Nordmann of the patient's admission.  GERD  Stable.   DVT prophylaxis: Heparin.  Code Status: (Full Code) Family Communication: none at bedside.  Disposition:   Status is: Inpatient.   The patient will require care spanning > 2 midnights and should be moved to inpatient because: IV antibiotics      Consultants:  None.   Procedures: echocardiogram.   Antimicrobials:  Antibiotics Given (last 72 hours)     Date/Time Action Medication Dose Rate   12/26/20 1608 New Bag/Given   azithromycin (ZITHROMAX) 500 mg in sodium chloride 0.9 % 250 mL IVPB 500 mg 250 mL/hr   12/27/20 1329 New Bag/Given   cefTRIAXone (ROCEPHIN) 1 g in sodium chloride 0.9 % 100 mL IVPB 1 g 200 mL/hr   12/27/20 1623 New Bag/Given   azithromycin (ZITHROMAX) 500 mg in sodium  chloride 0.9 % 250 mL IVPB 500 mg 250 mL/hr   12/28/20 1432 New Bag/Given   cefTRIAXone (ROCEPHIN) 1 g in sodium chloride 0.9 % 100 mL IVPB 1 g 200 mL/hr   12/28/20 1621 New Bag/Given   azithromycin (ZITHROMAX) 500 mg in sodium chloride 0.9 % 250 mL IVPB 500 mg 250 mL/hr   12/29/20 1350 New Bag/Given   cefTRIAXone (ROCEPHIN) 1 g in sodium chloride 0.9 % 100 mL IVPB 1 g 200 mL/hr         Subjective:  Pt wants to go home, but his platelets continued to drop to 30,000. No chest  pain.  No bleeding .  Objective: Vitals:   12/28/20 2140 12/29/20 0531 12/29/20 1021 12/29/20 1440  BP: 126/80 129/89 116/65 112/62  Pulse: (!) 101 95  (!) 104  Resp: 20 15  16   Temp: 98.2 F (36.8 C) 97.6 F (36.4 C)  98.2 F (36.8 C)  TempSrc: Oral   Oral  SpO2: 96% 99%  100%  Weight:      Height:        Intake/Output Summary (Last 24 hours) at 12/29/2020 1524 Last data filed at 12/29/2020 1000 Gross per 24 hour  Intake 2137.19 ml  Output 1325 ml  Net 812.19 ml    Filed Weights   12/26/20 1721  Weight: 78.7 kg    Examination:  General exam: Elderly gentleman, appears comfortable Respiratory system: Diminished air entry at bases, on room air Cardiovascular system: S1 & S2 heard, irregularly irregular, tachycardic.  no JVD , pedal edema present.  Gastrointestinal system: Abdomen is nondistended, soft and nontender. Normal bowel sounds heard. Central nervous system: Alert and oriented. No focal neurological deficits. Extremities: Symmetric 5 x 5 power. Skin: No rashes, lesions or ulcers Psychiatry:  Mood & affect appropriate.      Data Reviewed: I have personally reviewed following labs and imaging studies  CBC: Recent Labs  Lab 12/24/20 1200 12/25/20 1555 12/26/20 1120 12/27/20 0005 12/28/20 0138 12/29/20 0955  WBC 3.8* 5.3 2.6* 2.1* 0.9* 0.8*  NEUTROABS 3.2 4.3 2.1  --  0.3* 0.2*  HGB 12.2* 11.3* 10.5* 10.0* 8.4* 8.3*  HCT 35.1* 33.1* 30.2* 28.5* 23.8* 24.0*  MCV 93.6 92.5 93.2 92.5 91.9 91.6  PLT 199 163 119* 99* 51* 30*     Basic Metabolic Panel: Recent Labs  Lab 12/25/20 1555 12/26/20 1120 12/26/20 1406 12/27/20 0005 12/28/20 0138 12/28/20 1020 12/29/20 0444  NA 141 135  --  137 136  --  137  K 3.9 3.3*  --  3.6 3.1*  --  3.7  CL 104 100  --  109 108  --  108  CO2 26 25  --  24 23  --  24  GLUCOSE 118* 189*  --  109* 115*  --  95  BUN 53* 54*  --  37* 25*  --  16  CREATININE 1.47* 1.14  --  0.93 0.93  --  0.86  CALCIUM 8.7* 8.2*  --   8.0* 7.6*  --  8.0*  MG  --   --  2.0  --   --  1.9  --   PHOS  --   --  3.1  --   --   --   --      GFR: Estimated Creatinine Clearance: 70.7 mL/min (by C-G formula based on SCr of 0.86 mg/dL).  Liver Function Tests: Recent Labs  Lab 12/25/20 1555 12/27/20 0005  AST 40 31  ALT  63* 49*  ALKPHOS 121 84  BILITOT 1.2 1.0  PROT 6.7 5.8*  ALBUMIN 3.5 3.1*     CBG: Recent Labs  Lab 12/26/20 1128  GLUCAP 193*      Recent Results (from the past 240 hour(s))  Resp Panel by RT-PCR (Flu A&B, Covid) Nasopharyngeal Swab     Status: None   Collection Time: 12/24/20 12:03 PM   Specimen: Nasopharyngeal Swab; Nasopharyngeal(NP) swabs in vial transport medium  Result Value Ref Range Status   SARS Coronavirus 2 by RT PCR NEGATIVE NEGATIVE Final    Comment: (NOTE) SARS-CoV-2 target nucleic acids are NOT DETECTED.  The SARS-CoV-2 RNA is generally detectable in upper respiratory specimens during the acute phase of infection. The lowest concentration of SARS-CoV-2 viral copies this assay can detect is 138 copies/mL. A negative result does not preclude SARS-Cov-2 infection and should not be used as the sole basis for treatment or other patient management decisions. A negative result may occur with  improper specimen collection/handling, submission of specimen other than nasopharyngeal swab, presence of viral mutation(s) within the areas targeted by this assay, and inadequate number of viral copies(<138 copies/mL). A negative result must be combined with clinical observations, patient history, and epidemiological information. The expected result is Negative.  Fact Sheet for Patients:  EntrepreneurPulse.com.au  Fact Sheet for Healthcare Providers:  IncredibleEmployment.be  This test is no t yet approved or cleared by the [place] FDA and  has been authorized for detection and/or diagnosis of SARS-CoV-2 by FDA under an Emergency Use  Authorization (EUA). This EUA will remain  in effect (meaning this test can be used) for the duration of the COVID-19 declaration under Section 564(b)(1) of the Act, 21 U.S.C.section 360bbb-3(b)(1), unless the authorization is terminated  or revoked sooner.       Influenza A by PCR NEGATIVE NEGATIVE Final   Influenza B by PCR NEGATIVE NEGATIVE Final    Comment: (NOTE) The Xpert Xpress SARS-CoV-2/FLU/RSV plus assay is intended as an aid in the diagnosis of influenza from Nasopharyngeal swab specimens and should not be used as a sole basis for treatment. Nasal washings and aspirates are unacceptable for Xpert Xpress SARS-CoV-2/FLU/RSV testing.  Fact Sheet for Patients: EntrepreneurPulse.com.au  Fact Sheet for Healthcare Providers: IncredibleEmployment.be  This test is not yet approved or cleared by the [place] FDA and has been authorized for detection and/or diagnosis of SARS-CoV-2 by FDA under an Emergency Use Authorization (EUA). This EUA will remain in effect (meaning this test can be used) for the duration of the COVID-19 declaration under Section 564(b)(1) of the Act, 21 U.S.C. section 360bbb-3(b)(1), unless the authorization is terminated or revoked.  Performed at [facility], [place] [address], [place], [place] [address]   Blood culture (routine x 2)     Status: None (Preliminary result)   Collection Time: 12/26/20  3:50 PM   Specimen: BLOOD  Result Value Ref Range Status   Specimen Description   Final    BLOOD BLOOD RIGHT ARM Performed at [place] [address], [place], [place] [address]    Special Requests   Final    BOTTLES DRAWN AEROBIC AND ANAEROBIC Blood Culture adequate volume Performed at [facility] [address], [place], [place] [address]    Culture   Final    NO GROWTH 3 DAYS Performed at [facility], [place] [address], [place], [place]  [address]    Report Status PENDING  Incomplete  Blood culture (  routine x 2)     Status: None (Preliminary result)   Collection Time: 12/26/20  3:52 PM   Specimen: BLOOD  Result Value Ref Range Status   Specimen Description   Final    BLOOD BLOOD RIGHT HAND Performed at [facility] [address], [place], [place] [address]    Special Requests   Final    BOTTLES DRAWN AEROBIC AND ANAEROBIC Blood Culture adequate volume Performed at [facility] [address] [place], [place] [address]    Culture   Final    NO GROWTH 3 DAYS Performed at [facility], [place] [address], [place], [place] [address]    Report Status PENDING  Incomplete          Radiology Studies: No results found.      Scheduled Meds:  apixaban  5 mg Oral BID   azithromycin  500 mg Oral q1800   diltiazem  240 mg Oral Daily   feeding supplement  237 mL Oral BID BM   folic acid  1 mg Oral Daily   pantoprazole  40 mg Oral Daily   Tbo-filgastrim (GRANIX) SQ  480 mcg Subcutaneous q1800   Continuous Infusions:  sodium chloride 75 mL/hr at 12/29/20 1117   cefTRIAXone (ROCEPHIN)  IV 1 g (12/29/20 1350)     LOS: 2 days        Hosie Poisson, MD [facility]   To contact the attending provider between 7A-7P or the covering provider during after hours 7P-7A, please log into the web site [email or web address] and access using universal [place] password for that web site. If you do not have the password, please call the hospital operator.  12/29/2020, 3:24 PM

## 2020-12-29 NOTE — TOC Transition Note (Signed)
Transition of Care [facility]) - CM/SW Discharge Note   Patient Details  Name: Joe Hill MRN: [id number] Date of Birth: [birth year]  Transition of [facility] CM/SW Contact:  Dessa Phi, RN Phone Number: 12/29/2020, 10:52 AM   Clinical Narrative: otpt PT recc-TC son Joe Hill in agreement to otpt PT-referral placed. No further CM needs.      Final next level of care: OP Rehab Barriers to Discharge: No Barriers Identified   Patient Goals and CMS Choice        Discharge Placement                       Discharge Plan and Services                                     Social Determinants of Health (SDOH) Interventions     Readmission Risk Interventions No flowsheet data found.

## 2020-12-29 NOTE — Progress Notes (Signed)
Physical Therapy Treatment and Discharge Patient Details Name: Joe Hill MRN: [id number] DOB: 1940-06-22 Today's Date: 12/29/2020   History of Present Illness 80 y.o. male adm with syncope,  CTA chest shows small right lower lobe pulmonary emboli . 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  Sternal fracture favored to be subacute.     PMH: bilateral adrenal mets, colon polyps, COPD/emphysema, GERD, hyperglycemia, hypertension, tobacco use, prostate cancer.    PT Comments    Pt has ambulating in hallway with nursing.  Pt also ambulated with PT in hallway and pushed IV pole.  Pt agreeable to continue mobilizing with nursing during remainder of admission.  Pt a little frustrated and "disappointed" today since he has to remain in hospital due to low WBCs and to receive injection tonight.  Pt eager for d/c asap.   Pt would benefit from OPPT upon d/c and agreeable to ambulate with staff in hospital.  PT to sign off at this time.    Recommendations for follow up therapy are one component of a multi-disciplinary discharge planning process, led by the attending physician.  Recommendations may be updated based on patient status, additional functional criteria and insurance authorization.  Follow Up Recommendations  Outpatient PT     Assistance Recommended at Discharge Intermittent Supervision/Assistance  Equipment Recommendations  None recommended by PT    Recommendations for Other Services       Precautions / Restrictions Precautions Precautions: Fall     Mobility  Bed Mobility               General bed mobility comments: OOB    Transfers Overall transfer level: Needs assistance Equipment used: None Transfers: Sit to/from Stand Sit to Stand: Supervision                Ambulation/Gait Ambulation/Gait assistance: Min guard;Supervision Gait Distance (Feet): 180 Feet Assistive device: IV Pole Gait  Pattern/deviations: Step-through pattern;Decreased stride length       General Gait Details: pt pushed IV pole, no overt LOB observed today, HR 118 bpm and SPO2 97-98% on room air   Stairs             Wheelchair Mobility    Modified Rankin (Stroke Patients Only)       Balance Overall balance assessment: Mild deficits observed, not formally tested         Standing balance support: No upper extremity supported Standing balance-Leahy Scale: Fair                              Cognition Arousal/Alertness: Awake/alert Behavior During Therapy: WFL for tasks assessed/performed Overall Cognitive Status: Within Functional Limits for tasks assessed                                          Exercises      General Comments        Pertinent Vitals/Pain Pain Assessment: No/denies pain    Home Living                          Prior Function            PT Goals (current goals can now be found in the care plan section) Progress towards PT goals: Goals met/education completed, patient  discharged from PT    Frequency           PT Plan Current plan remains appropriate    Co-evaluation              AM-PAC PT "6 Clicks" Mobility   Outcome Measure  Help needed turning from your back to your side while in a flat bed without using bedrails?: None Help needed moving from lying on your back to sitting on the side of a flat bed without using bedrails?: A Little Help needed moving to and from a bed to a chair (including a wheelchair)?: A Little Help needed standing up from a chair using your arms (e.g., wheelchair or bedside chair)?: A Little Help needed to walk in hospital room?: A Little Help needed climbing 3-5 steps with a railing? : A Little 6 Click Score: 19    End of Session Equipment Utilized During Treatment: Gait belt Activity Tolerance: Patient tolerated treatment well Patient left: in chair;with call  bell/phone within reach Nurse Communication: Mobility status PT Visit Diagnosis: Difficulty in walking, not elsewhere classified (R26.2)     Time: 1440-1450 PT Time Calculation (min) (ACUTE ONLY): 10 min  Charges:  $Gait Training: 8-22 mins                    Arlyce Dice, DPT Acute Rehabilitation Services Pager: [phone] Office: [place] 12/29/2020, 3:52 PM

## 2020-12-29 NOTE — Consult Note (Signed)
Consultation Note Date: 12/29/2020   Patient Name: Joe Hill  DOB: 08-Feb-1940  MRN: [id number]  Age / Sex: 80 y.o., male  PCP: Joe Olp, MD Referring Physician: Hosie Poisson, MD  Reason for Consultation: Establishing goals of care  HPI/Patient Profile: 80 y.o. 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  He was admitted with multifocal pneumonia and PE as well as pancytopenia. Palliative Consult for goals of care.  Clinical Assessment and Goals of Care: Palliative care consult received.  Chart reviewed including personal review of pertinent labs and imaging.  I met today with Joe Hill.  He is a very pleasant 80 year old gentleman who is sitting in bed in no distress.  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  He reports the doctors have been doing a job explaining things to him and he can adequately report back to me history of what is been going out with his cancer and also with hospitalization and related neutropenia.  Reports he had this prior after receiving his first round of chemotherapy as well.  States he is hopeful he will just be able to get Neupogen injection to be discharged home soon.  He understands this we discussed with Joe Hill by primary hospitalist tomorrow.  He tells me the most important thing to him are his family, including his 2 sons and 5 grandchildren.  Additionally, he finds a lot of joy and working.  He works for a Therapist, sports doing deliveries/service work and states, "I do not know what I would do if they took that away for me."  Feels he is very much a "people person" and this is why he enjoys his job so much.  We  discussed clinical course as well as wishes moving forward in regard to advanced directives.  Concepts specific to code status and rehospitalization discussed.  Values and goals of care important to patient and family were attempted to be elicited.  We specifically discussed advance care planning and he indicates he is completing these documents.  We reviewed what is in the chart together and he reports that information is up-to-date.    Questions and concerns addressed.   PMT will continue to support holistically.  SUMMARY OF RECOMMENDATIONS   -Full code/full scope -Joe Hill recently started on chemotherapy for his advanced lung cancer.  He is invested in plan to continue with systemic therapy. -He has completed advanced directive naming his son since his surrogate decision makers.  There is a copy of this under ACP tab in chart. -Consideration for completion of further paperwork outlining his wishes such as MOST form but he declined today.  He reports, "my sons will know what to do based upon what is going on." -No further palliative specific recommendations at this point.  Code Status/Advance Care Planning: Full code   Additional  Recommendations (Limitations, Scope, Preferences): Full Scope Treatment  Psycho-social/Spiritual:  Desire for further Chaplaincy support:no Additional Recommendations: Caregiving  Support/Resources  Prognosis:  Unable to determine  Discharge Planning: Home      Primary Diagnoses: Present on Admission:  Syncope and collapse  Essential hypertension  GERD  Dyslipidemia  Aortic atherosclerosis (HCC)  Pancytopenia (HCC)  Hypokalemia  Hyperglycemia  Borderline prolonged QT interval  Multifocal pneumonia  Pulmonary embolism (HCC)  Atrial fibrillation with RVR (HCC)  Primary adenocarcinoma of upper lobe of left lung ([place])   I have reviewed the medical record, interviewed the patient and family, and examined the patient. The following aspects are  pertinent.  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   TOBACCO USE 01/30/2007   Social History   Socioeconomic History   Marital status: Single    Spouse name: Not on file   Number of children: Not on file   Years of education: Not on file   Highest education level: Not on file  Occupational History   Not on file  Tobacco Use   Smoking status: Every Day    Packs/day: 0.25    Types: Cigarettes   Smokeless tobacco: Never  Vaping Use   Vaping Use: Never used  Substance and Sexual Activity   Alcohol use: No    Alcohol/week: 0.0 standard drinks   Drug use: No   Sexual activity: Not on file  Other Topics Concern   Not on file  Social History Narrative   Divorced. 2 sons. 5 grandkids- 2 in college, 3 in hs in 2021.       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       HObbies: woodworking, taking care of yard, very active at work   Social Determinants of Radio broadcast assistant Strain: Not on file  Food Insecurity: Not on file  Transportation Needs: Not on file  Physical Activity: Not on file  Stress: Not on file  Social Connections: Not on file   Family History  Problem Relation Age of Onset   Hyperlipidemia Mother    Cancer Father        lung, smoker   Cancer Sister        breast, smoker   Arthritis Maternal Aunt    Scheduled Meds:  apixaban  5 mg Oral BID   azithromycin  500 mg Oral q1800   diltiazem  240 mg Oral Daily   feeding supplement  237 mL Oral BID BM   folic acid  1 mg Oral Daily   pantoprazole  40 mg Oral Daily   Continuous Infusions:  sodium chloride 75 mL/hr at 12/28/20 1835   cefTRIAXone (ROCEPHIN)  IV Stopped  (12/28/20 1502)   PRN Meds:.acetaminophen **OR** acetaminophen, dextromethorphan-guaiFENesin, oxymetazoline, prochlorperazine Medications Prior to Admission:  Prior to Admission medications   Medication Sig Start Date End Date Taking? Authorizing Provider  ADVIL 200 MG CAPS Take 200 mg by mouth every 6 (six) hours as needed (for mild pain or headaches).   Yes [provider]  albuterol (VENTOLIN HFA) 108 (90 Base) MCG/ACT inhaler Inhale 2 puffs into the lungs every 6 (six) hours as needed for wheezing (or to aid  in the removal of phlegm). 12/15/20  Yes [provider]  apixaban (ELIQUIS) 5 MG TABS tablet Take 1 tablet (5 mg total) by mouth 2 (two) times daily. Start Saturday night with the pm dose. Patient taking differently: Take 5 mg by mouth 2 (two) times daily. 11/01/20  Yes Barrett, Evelene Croon, PA-C  azithromycin (ZITHROMAX) 250 MG tablet Take 1 tablet (250 mg total) by mouth daily. Take first 2 tablets together, then 1 every day until finished. Patient taking differently: Take 250-500 mg by mouth See admin instructions. Take 500 mg by mouth one day one, then decrease to 250 mg once a day until finished 12/24/20  Yes Fredia Sorrow, MD  dextromethorphan-guaiFENesin [facility] DM) 30-600 MG 12hr tablet Take 1 tablet by mouth 2 (two) times daily as needed for cough.   Yes [provider]  diltiazem (CARDIZEM CD) 180 MG 24 hr capsule Take 1 capsule (180 mg total) by mouth daily. 10/30/20 10/30/21 Yes Barrett, Evelene Croon, PA-C  feeding supplement (BOOST HIGH PROTEIN) LIQD Take 1 Container by mouth 2 (two) times daily.   Yes [provider]  folic acid (FOLVITE) 1 MG tablet Take 1 tablet (1 mg total) by mouth daily. 11/18/20  Yes Curt Bears, MD  Nutritional Supplements (ENSURE COMPLETE PO) Take 237 mLs by mouth See admin instructions. Drink 237 ml's by mouth one to two times a day   Yes [provider]  potassium chloride SA (KLOR-CON) 20 MEQ tablet Take 1  tablet (20 mEq total) by mouth daily. Take 2 tablets (21mq) for 2 days then take 1 tablet (286m) daily Patient taking differently: Take 20 mEq by mouth daily. 11/17/20  Yes TiShirley FriarPA-C  prochlorperazine (COMPAZINE) 10 MG tablet Take 1 tablet (10 mg total) by mouth every 6 (six) hours as needed for nausea or vomiting. 11/18/20  Yes MoCurt BearsMD  diltiazem (CARDIZEM CD) 120 MG 24 hr capsule Take 2 capsules (240 mg total) by mouth daily. Patient not taking: Reported on 12/26/2020 12/24/20   ZaFredia SorrowMD   Allergies  Allergen Reactions   Penicillins Hives   Review of Systems  Constitutional:  Positive for activity change and fatigue.  HENT:  Positive for nosebleeds.   Respiratory:  Positive for cough and shortness of breath.   Neurological:  Positive for syncope and weakness.  Psychiatric/Behavioral:  Positive for sleep disturbance.    Physical Exam General: Alert, awake, in no acute distress.   HEENT: No bruits, no goiter, no JVD Heart: Regular rate and rhythm. No murmur appreciated. Lungs: Decreased at bases Abdomen: Soft, nontender, nondistended, positive bowel sounds.   Ext: No significant edema.  Bilateral bruising of arms noted Skin: Warm and dry Neuro: Grossly intact, nonfocal.   Vital Signs: BP 129/89 (BP Location: Right Arm)   Pulse 95   Temp 97.6 F (36.4 C)   Resp 15   Ht _0  (1.778 m)   Wt 78.7 kg   SpO2 99%   BMI 24.89 kg/m  Pain Scale: 0-10   Pain Score: 0-No pain   SpO2: SpO2: 99 % O2 Device:SpO2: 99 % O2 Flow Rate: .O2 Flow Rate (L/min): 1 L/min  IO: Intake/output summary:  Intake/Output Summary (Last 24 hours) at 12/29/2020 0932 Last data filed at 12/29/2020 0648 Gross per 24 hour  Intake 2137.19 ml  Output 1125 ml  Net 1012.19 ml    LBM: Last BM Date: 12/25/20 Baseline Weight: Weight: 78.7 kg Most recent weight: Weight: 78.7 kg  Palliative Assessment/Data:   Flowsheet Rows    Flowsheet Row Most Recent  Value  Intake Tab   Referral Department Hospitalist  Unit at Time of Referral Med/Surg Unit  Palliative Care Primary Diagnosis Cancer  Date Notified 12/27/20  Palliative Care Type New Palliative care  Date of Admission 12/26/20  Date first seen by Palliative Care 12/28/20  # of days Palliative referral response time 1 Day(s)  # of days IP prior to Palliative referral 1  Clinical Assessment   Palliative Performance Scale Score 60%  Psychosocial & Spiritual Assessment   Palliative Care Outcomes   Patient/Family meeting held? Yes  Who was at the meeting? Patient       Time In: 1830 Time Out: 1930 Time Total: 60 Greater than 50%  of this time was spent counseling and coordinating care related to the above assessment and plan.  Signed by: Micheline Rough, MD   Please contact Palliative Medicine Team phone at [phone] for questions and concerns.  For individual provider: See Shea Evans

## 2020-12-30 DIAGNOSIS — I2694 Multiple subsegmental pulmonary emboli without acute cor pulmonale: Secondary | ICD-10-CM

## 2020-12-30 DIAGNOSIS — T451X5A Adverse effect of antineoplastic and immunosuppressive drugs, initial encounter: Secondary | ICD-10-CM

## 2020-12-30 DIAGNOSIS — D61818 Other pancytopenia: Secondary | ICD-10-CM | POA: Diagnosis not present

## 2020-12-30 DIAGNOSIS — D701 Agranulocytosis secondary to cancer chemotherapy: Secondary | ICD-10-CM | POA: Diagnosis present

## 2020-12-30 LAB — CBC WITH DIFFERENTIAL/PLATELET
Abs Immature Granulocytes: 0.06 10*3/uL (ref 0.00–0.07)
Basophils Absolute: 0 10*3/uL (ref 0.0–0.1)
Basophils Relative: 0 %
Eosinophils Absolute: 0 10*3/uL (ref 0.0–0.5)
Eosinophils Relative: 2 %
HCT: 21.7 % — ABNORMAL LOW (ref 39.0–52.0)
Hemoglobin: 7.8 g/dL — ABNORMAL LOW (ref 13.0–17.0)
Immature Granulocytes: 7 %
Lymphocytes Relative: 44 %
Lymphs Abs: 0.4 10*3/uL — ABNORMAL LOW (ref 0.7–4.0)
MCH: 32.6 pg (ref 26.0–34.0)
MCHC: 35.9 g/dL (ref 30.0–36.0)
MCV: 90.8 fL (ref 80.0–100.0)
Monocytes Absolute: 0.2 10*3/uL (ref 0.1–1.0)
Monocytes Relative: 18 %
Neutro Abs: 0.3 10*3/uL — CL (ref 1.7–7.7)
Neutrophils Relative %: 29 %
Platelets: 18 10*3/uL — CL (ref 150–400)
RBC: 2.39 MIL/uL — ABNORMAL LOW (ref 4.22–5.81)
RDW: 13.2 % (ref 11.5–15.5)
WBC: 0.9 10*3/uL — CL (ref 4.0–10.5)
nRBC: 0 % (ref 0.0–0.2)

## 2020-12-30 LAB — PREPARE RBC (CROSSMATCH)

## 2020-12-30 MED ORDER — SODIUM CHLORIDE 0.9% IV SOLUTION: Freq: Once | INTRAVENOUS | Status: DC

## 2020-12-30 NOTE — Plan of Care (Signed)
  Problem: Education: Goal: Knowledge of General Education information will improve Description: Including pain rating scale, medication(s)/side effects and non-pharmacologic comfort measures Outcome: Progressing   Problem: Clinical Measurements: Goal: Will remain free from infection Outcome: Progressing Goal: Diagnostic test results will improve Outcome: Progressing Goal: Cardiovascular complication will be avoided Outcome: Progressing

## 2020-12-30 NOTE — Progress Notes (Signed)
   12/30/20 1000  Provider Notification  Provider Name/Title Dr. Karleen Hampshire  Date Provider Notified 12/30/20  Time Provider Notified 1036  Notification Type Page  Notification Reason Critical result  Test performed and critical result Absolute neutrophil 0.3  Date Critical Result Received 12/30/20  Time Critical Result Received 1030  Provider response No new orders  Date of Provider Response 12/30/20  Time of Provider Response 84  Notified MD of critical lab result. No new orders.

## 2020-12-30 NOTE — Progress Notes (Signed)
PROGRESS NOTE    Joe Hill  NFA:[id number] DOB: 15-Jan-1941 DOA: 12/26/2020 PCP: [name], MD    Chief Complaint  Patient presents with   Fall    Rib pain   Loss of Consciousness    Brief Narrative:  Joe Hill is a 80 y.o. male with medical history significant of bilateral adrenal mets, colon polyps, COPD/emphysema, GERD, hyperglycemia, hypertension, tobacco use, prostate cancer who was brought to the emergency department via EMS after having an unwitnessed syncopal episode and collapse at home hitting his left rib cage.  The patient does not remember having any prodromal symptoms before passing out and just remembered being on the floor.  CTA chest shows small right lower lobe pulmonary emboli.  There has been worsening of multifocal pneumonia.  Signs of primary lung cancer in the left chest with mediastinal involvement . He was also found to be in atrial fib with RVR.  He was started on IV rocephin and IV zithromax for his multifocal  pneumonia. He was weaned off oxygen. But he became pancytopenic requiring Granix injection, prbc and platelet transfusions.    Assessment & Plan:   Principal Problem:   Syncope and collapse Active Problems:   Essential hypertension   GERD   Hyperglycemia   Dyslipidemia   Aortic atherosclerosis (HCC)   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HCC)   Pancytopenia (HCC)   Hypokalemia   Borderline prolonged QT interval   Multifocal pneumonia   Pulmonary embolism (HCC)   Atrial fibrillation with RVR (HCC)   Chemotherapy-induced neutropenia (HCC)   Atrial fibrillation with RVR  Rate control with Cardizem 240 mg daily Echocardiogram ordered and reviewed. On eliquis for anti coagulation.  Troponin negative.  Pt denies any chest pain or sob.    Multifocal Pneumonia/ acute respiratory failure with hypoxia initially requiring.  2 lit of [place] oxygen.   - on IV antibiotics, day 4 of antibiotics, possible transition to augmentin for discharge.  -  urine for strep pneumonia is negative.  - weaned off oxygen.    Questionable pulmonary embolism/artifact Repeat CTA study to evaluate for PE is negative for PE.   Pancytopenia:  -Possibly from chemotherapy, oncology consulted, granix ordered.  - platelets have dropped to 18,000, with some nose bleed, 1 unit of platelets ordered by oncology.   transfuse to keep platelets >20,000. - hemoglobin dropped to 7.8, transfuse 1 unit of prbc , recheck counts in am.  - ANC improved to 300 today. Granix injection ordered     Stage IV (T2 a, N3, M1 B) non-small cell lung cancer, adenocarcinoma presented with large central left upper lobe lung mass in addition to bilateral mediastinal lymphadenopathy and right upper lobe metastatic nodule as well as left adrenal metastasis diagnosed in October 2022.   - Systemic chemotherapy with carboplatin for AUC of 5, Alimta 500 Mg/M2 and Keytruda 200 Mg IV every 3 weeks, status post 2 cycles, last cycle on 12/17/20. -We  will notify Dr. Julien Nordmann of the patient's admission.  GERD  Stable.   DVT prophylaxis: scd's Code Status: (Full Code) Family Communication: none at bedside.  Disposition:   Status is: Inpatient.   The patient will require care spanning > 2 midnights and should be moved to inpatient because: IV antibiotics      Consultants:  Oncology.   Procedures: echocardiogram.   Antimicrobials:  Antibiotics Given (last 72 hours)     Date/Time Action Medication Dose Rate   12/27/20 1329 New Bag/Given   cefTRIAXone (ROCEPHIN) 1  g in sodium chloride 0.9 % 100 mL IVPB 1 g 200 mL/hr   12/27/20 1623 New Bag/Given   azithromycin (ZITHROMAX) 500 mg in sodium chloride 0.9 % 250 mL IVPB 500 mg 250 mL/hr   12/28/20 1432 New Bag/Given   cefTRIAXone (ROCEPHIN) 1 g in sodium chloride 0.9 % 100 mL IVPB 1 g 200 mL/hr   12/28/20 1621 New Bag/Given   azithromycin (ZITHROMAX) 500 mg in sodium chloride 0.9 % 250 mL IVPB 500 mg 250 mL/hr   12/29/20 1350 New  Bag/Given   cefTRIAXone (ROCEPHIN) 1 g in sodium chloride 0.9 % 100 mL IVPB 1 g 200 mL/hr   12/29/20 1715 Given   azithromycin (ZITHROMAX) tablet 500 mg 500 mg    12/30/20 1300 New Bag/Given   cefTRIAXone (ROCEPHIN) 1 g in sodium chloride 0.9 % 100 mL IVPB 1 g 200 mL/hr         Subjective: No chest pain , no nausea, vomiting or abdominal pain. .  Objective: Vitals:   12/29/20 1021 12/29/20 1440 12/29/20 2006 12/30/20 0531  BP: 116/65 112/62 130/70 101/62  Pulse:  (!) 104 96 90  Resp:  16 15 16   Temp:  98.2 F (36.8 C) 98.1 F (36.7 C) 98 F (36.7 C)  TempSrc:  Oral Oral Oral  SpO2:  100% 99% 99%  Weight:      Height:        Intake/Output Summary (Last 24 hours) at 12/30/2020 1309 Last data filed at 12/30/2020 0900 Gross per 24 hour  Intake 2243.9 ml  Output 1025 ml  Net 1218.9 ml    Filed Weights   12/26/20 1721  Weight: 78.7 kg    Examination:  General exam: elderly gentleman not in distress.  Respiratory system: air entry fair, no wheezing heard, on RA today.  Cardiovascular system: S1 & S2 heard, RRR. No JVD, pedal edema present.  Gastrointestinal system: Abdomen is nondistended, soft and nontender. No organomegaly or masses felt. Normal bowel sounds heard. Central nervous system: Alert and oriented. No focal neurological deficits. Extremities: Symmetric 5 x 5 power. Skin: No rashes, lesions or ulcers Psychiatry: Mood & affect appropriate.       Data Reviewed: I have personally reviewed following labs and imaging studies  CBC: Recent Labs  Lab 12/25/20 1555 12/26/20 1120 12/27/20 0005 12/28/20 0138 12/29/20 0955 12/30/20 0915  WBC 5.3 2.6* 2.1* 0.9* 0.8* 0.9*  NEUTROABS 4.3 2.1  --  0.3* 0.2* 0.3*  HGB 11.3* 10.5* 10.0* 8.4* 8.3* 7.8*  HCT 33.1* 30.2* 28.5* 23.8* 24.0* 21.7*  MCV 92.5 93.2 92.5 91.9 91.6 90.8  PLT 163 119* 99* 51* 30* 18*     Basic Metabolic Panel: Recent Labs  Lab 12/25/20 1555 12/26/20 1120 12/26/20 1406  12/27/20 0005 12/28/20 0138 12/28/20 1020 12/29/20 0444  NA 141 135  --  137 136  --  137  K 3.9 3.3*  --  3.6 3.1*  --  3.7  CL 104 100  --  109 108  --  108  CO2 26 25  --  24 23  --  24  GLUCOSE 118* 189*  --  109* 115*  --  95  BUN 53* 54*  --  37* 25*  --  16  CREATININE 1.47* 1.14  --  0.93 0.93  --  0.86  CALCIUM 8.7* 8.2*  --  8.0* 7.6*  --  8.0*  MG  --   --  2.0  --   --  1.9  --  PHOS  --   --  3.1  --   --   --   --      GFR: Estimated Creatinine Clearance: 70.7 mL/min (by C-G formula based on SCr of 0.86 mg/dL).  Liver Function Tests: Recent Labs  Lab 12/25/20 1555 12/27/20 0005  AST 40 31  ALT 63* 49*  ALKPHOS 121 84  BILITOT 1.2 1.0  PROT 6.7 5.8*  ALBUMIN 3.5 3.1*     CBG: Recent Labs  Lab 12/26/20 1128  GLUCAP 193*      Recent Results (from the past 240 hour(s))  Resp Panel by RT-PCR (Flu A&B, Covid) Nasopharyngeal Swab     Status: None   Collection Time: 12/24/20 12:03 PM   Specimen: Nasopharyngeal Swab; Nasopharyngeal(NP) swabs in vial transport medium  Result Value Ref Range Status   SARS Coronavirus 2 by RT PCR NEGATIVE NEGATIVE Final    Comment: (NOTE) SARS-CoV-2 target nucleic acids are NOT DETECTED.  The SARS-CoV-2 RNA is generally detectable in upper respiratory specimens during the acute phase of infection. The lowest concentration of SARS-CoV-2 viral copies this assay can detect is 138 copies/mL. A negative result does not preclude SARS-Cov-2 infection and should not be used as the sole basis for treatment or other patient management decisions. A negative result may occur with  improper specimen collection/handling, submission of specimen other than nasopharyngeal swab, presence of viral mutation(s) within the areas targeted by this assay, and inadequate number of viral copies(<138 copies/mL). A negative result must be combined with clinical observations, patient history, and epidemiological information. The expected result  is Negative.  Fact Sheet for Patients:  EntrepreneurPulse.com.au  Fact Sheet for Healthcare Providers:  IncredibleEmployment.be  This test is no t yet approved or cleared by the [place] FDA and  has been authorized for detection and/or diagnosis of SARS-CoV-2 by FDA under an Emergency Use Authorization (EUA). This EUA will remain  in effect (meaning this test can be used) for the duration of the COVID-19 declaration under Section 564(b)(1) of the Act, 21 U.S.C.section 360bbb-3(b)(1), unless the authorization is terminated  or revoked sooner.       Influenza A by PCR NEGATIVE NEGATIVE Final   Influenza B by PCR NEGATIVE NEGATIVE Final    Comment: (NOTE) The Xpert Xpress SARS-CoV-2/FLU/RSV plus assay is intended as an aid in the diagnosis of influenza from Nasopharyngeal swab specimens and should not be used as a sole basis for treatment. Nasal washings and aspirates are unacceptable for Xpert Xpress SARS-CoV-2/FLU/RSV testing.  Fact Sheet for Patients: EntrepreneurPulse.com.au  Fact Sheet for Healthcare Providers: IncredibleEmployment.be  This test is not yet approved or cleared by the [place] FDA and has been authorized for detection and/or diagnosis of SARS-CoV-2 by FDA under an Emergency Use Authorization (EUA). This EUA will remain in effect (meaning this test can be used) for the duration of the COVID-19 declaration under Section 564(b)(1) of the Act, 21 U.S.C. section 360bbb-3(b)(1), unless the authorization is terminated or revoked.  Performed at [facility], [place] [address], [place], [place] [address]   Blood culture (routine x 2)     Status: None (Preliminary result)   Collection Time: 12/26/20  3:50 PM   Specimen: BLOOD  Result Value Ref Range Status   Specimen Description   Final    BLOOD BLOOD RIGHT ARM Performed at [facility]  [address] [place], [place] [address]    Special Requests   Final    BOTTLES DRAWN AEROBIC  AND ANAEROBIC Blood Culture adequate volume Performed at [place] [address] [place], [place] [address]    Culture   Final    NO GROWTH 4 DAYS Performed at [facility], [place] [address] [place], [place] [address]    Report Status PENDING  Incomplete  Blood culture (routine x 2)     Status: None (Preliminary result)   Collection Time: 12/26/20  3:52 PM   Specimen: BLOOD  Result Value Ref Range Status   Specimen Description   Final    BLOOD BLOOD RIGHT HAND Performed at [place] [address] [place], [place] [address]    Special Requests   Final    BOTTLES DRAWN AEROBIC AND ANAEROBIC Blood Culture adequate volume Performed at [place] [address] [place], [place] [address]    Culture   Final    NO GROWTH 4 DAYS Performed at [facility], [place] [address], [place], [place] [address]    Report Status PENDING  Incomplete          Radiology Studies: No results found.      Scheduled Meds:  sodium chloride   Intravenous Once   sodium chloride   Intravenous Once   azithromycin  500 mg Oral q1800   diltiazem  240 mg Oral Daily   feeding supplement  237 mL Oral BID BM   folic acid  1 mg Oral Daily   pantoprazole  40 mg Oral Daily   Tbo-filgastrim (GRANIX) SQ  480 mcg Subcutaneous q1800   Continuous Infusions:  sodium chloride 75 mL/hr at 12/29/20 2316   cefTRIAXone (ROCEPHIN)  IV 1 g (12/30/20 1300)     LOS: 3 days        Hosie Poisson, MD [facility]   To contact the attending provider between 7A-7P or the covering provider during after hours 7P-7A, please log into the web site [email or web address] and access using universal [place] password for that web site. If you do not have the password, please call the hospital operator.  12/30/2020, 1:09 PM

## 2020-12-30 NOTE — Progress Notes (Signed)
HEMATOLOGY-ONCOLOGY PROGRESS NOTE  SUBJECTIVE: Joe Hill has developed epistaxis this morning.  No other bleeding reported.  Afebrile.  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  Denies mucositis or sore throat Respiratory: Denies cough, dyspnea or wheezes Cardiovascular: Denies palpitation, chest discomfort Gastrointestinal:  Denies nausea, heartburn or change in bowel habits Skin: Denies abnormal skin rashes Lymphatics: Denies new lymphadenopathy  Neurological:Denies numbness, tingling or new weaknesses Behavioral/Psych: Mood is stable, no new changes  Extremities: No lower extremity edema All other systems were reviewed with the patient and are negative.  I have reviewed the past medical history, past surgical history, social history and family history with the patient and they are unchanged from previous note.   PHYSICAL EXAMINATION: ECOG PERFORMANCE STATUS: 1 - Symptomatic but completely ambulatory  Vitals:   12/29/20 2006 12/30/20 0531  BP: 130/70 101/62  Pulse: 96 90  Resp: 15 16  Temp: 98.1 F (36.7 C) 98 F (36.7 C)  SpO2: 99% 99%   Filed Weights   12/26/20 1721  Weight: 78.7 kg    Intake/Output from previous day: 12/05 0701 - 12/06 0700 In: 2143.9 [P.O.:120; I.V.:2023.9] Out: 1000 [Urine:1000]  GENERAL:alert, no distress and comfortable SKIN: skin color, texture,  turgor are normal, no rashes or significant lesions EYES: normal, Conjunctiva are pink and non-injected, sclera clear LUNGS: clear to auscultation and percussion with normal breathing effort HEART: regular rate & rhythm and no murmurs and no lower extremity edema ABDOMEN:abdomen soft, non-tender and normal bowel sounds NEURO: alert & oriented x 3 with fluent speech, no focal motor/sensory deficits  LABORATORY DATA:  I have reviewed the data as listed CMP Latest Ref Rng & Units 12/29/2020 12/28/2020 12/27/2020  Glucose 70 - 99 mg/dL 95 115(H) 109(H)  BUN 8 - 23 mg/dL 16 25(H) 37(H)  Creatinine 0.61 - 1.24 mg/dL 0.86 0.93 0.93  Sodium 135 - 145 mmol/L 137 136 137  Potassium 3.5 - 5.1 mmol/L 3.7 3.1(L) 3.6  Chloride 98 - 111 mmol/L 108 108 109  CO2 22 - 32 mmol/L _0 Calcium 8.9 - 10.3 mg/dL 8.0(L) 7.6(L) 8.0(L)  Total Protein 6.5 - 8.1 g/dL - - 5.8(L)  Total Bilirubin 0.3 - 1.2 mg/dL - - 1.0  Alkaline Phos 38 - 126 U/L - - 84  AST 15 - 41 U/L - - 31  ALT 0 - 44 U/L - - 49(H)    Lab Results  Component Value Date   WBC 0.9 (LL) 12/30/2020   HGB 7.8 (L) 12/30/2020   HCT 21.7 (L) 12/30/2020   MCV 90.8 12/30/2020   PLT 18 (LL) 12/30/2020   NEUTROABS 0.3 (LL) 12/30/2020    DG Chest 2 View  Result Date: 12/24/2020 CLINICAL DATA:  Shortness of breath, difficulty breathing EXAM: CHEST - 2 VIEW COMPARISON:  Chest radiograph 12/15/2020 FINDINGS: The cardiomediastinal silhouette is stable. Increased interstitial markings are seen throughout  both lungs likely reflecting chronic changes on a background of emphysema as seen on prior CT chest. Patchy opacities in the left base are unchanged. There is no new or worsening focal airspace disease. The known left upper lobe nodule is not well assessed on the current study. There is no pleural effusion or pneumothorax. There is no acute osseous abnormality. IMPRESSION: Patchy opacities in the left base, not significantly changed and again favored to  reflect atelectasis. No new or worsening airspace disease. Electronically Signed   By: Valetta Mole M.D.   On: 12/24/2020 11:49   DG Chest 2 View  Result Date: 12/15/2020 CLINICAL DATA:  Cough and congestion EXAM: CHEST - 2 VIEW COMPARISON:  Chest x-ray dated October 28, 2020 FINDINGS: Cardiac and mediastinal contours within normal limits. Unchanged left upper lobe mass. New mild bibasilar opacities. No pleural effusion or pneumothorax. Inferior right costophrenic angle is excluded from the field of view. IMPRESSION: 1. New mild bibasilar opacities differential includes atelectasis, aspiration or infection. 2. Left upper lobe mass, similar to prior radiograph. Electronically Signed   By: Yetta Glassman M.D.   On: 12/15/2020 15:50   DG Ribs Unilateral W/Chest Left  Result Date: 12/26/2020 CLINICAL DATA:  Fall, left anterior rib pain EXAM: LEFT RIBS AND CHEST - 3+ VIEW COMPARISON:  Chest radiographs and CT chest, 12/24/2020 FINDINGS: No displaced fracture or other bone lesions are seen involving the ribs. There is no evidence of pneumothorax or pleural effusion. Heterogeneous airspace opacity of the left lung base. Unchanged elevation of the left hemidiaphragm. Heart size and mediastinal contours are within normal limits. IMPRESSION: 1. No displaced rib fracture. 2. Heterogeneous airspace opacity of the left lung base, consistent with infection or aspiration, as seen on prior. Electronically Signed   By: Delanna Ahmadi M.D.   On: 12/26/2020 12:14   CT Head Wo Contrast  Result Date: 12/26/2020 CLINICAL DATA:  Neck trauma. Head trauma, minor. Additional history provided: Unwitnessed syncopal episode and fall. Patient reports left rib pain, difficulty taking deep breaths. EXAM: CT HEAD WITHOUT CONTRAST CT CERVICAL SPINE WITHOUT CONTRAST TECHNIQUE: Multidetector CT imaging of the head and cervical spine was performed following the standard protocol without intravenous contrast. Multiplanar CT image  reconstructions of the cervical spine were also generated. COMPARISON:  Brain MRI 10/09/2020. FINDINGS: CT HEAD FINDINGS Brain: Mild-to-moderate generalized cerebral atrophy. Comparatively mild cerebellar atrophy. 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 Background moderate/advanced patchy and ill-defined hypoattenuation within the cerebral white matter, nonspecific but compatible with chronic small vessel ischemic disease. There is no acute intracranial hemorrhage. No demarcated cortical infarct. No extra-axial fluid collection. No evidence of an intracranial mass. No midline shift. Vascular: No hyperdense vessel.  Atherosclerotic calcifications. Skull: Normal. Negative for fracture or focal lesion. Sinuses/Orbits: Visualized orbits show no acute finding. Mild mucosal thickening within the bilateral ethmoid and maxillary sinuses at the imaged levels. CT CERVICAL SPINE FINDINGS Alignment: Straightening of the expected cervical lordosis. No significant spondylolisthesis. Skull base and vertebrae: The basion-dental and atlanto-dental intervals are maintained.No evidence of acute fracture to the cervical spine. Soft tissues and spinal canal: No prevertebral fluid or swelling. No visible canal hematoma. Disc levels: Cervical spondylosis with multilevel disc space narrowing, disc bulges, posterior disc osteophytes, endplate spurring, uncovertebral hypertrophy and facet arthrosis. Disc space narrowing is greatest at C5-C6, C6-C7 and T2-T3 (moderate at these levels). No appreciable  levels of severe spinal canal stenosis. Multilevel bony neural foraminal narrowing. Multilevel ventral osteophytes. Upper chest: No consolidation within the imaged lung apices. No visible pneumothorax. Centrilobular and paraseptal emphysema with biapical bulla. Biapical pleuroparenchymal  scarring. IMPRESSION: CT head: 1. No evidence of acute intracranial abnormality. 2. 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 3. Mild paranasal sinus disease at the imaged levels. CT cervical spine: 1. No evidence of acute fracture to the cervical spine. 2. Straightening of the expected cervical lordosis. 3. Cervical spondylosis, as described. 4. Emphysema (ICD10-J43.9). Associated biapical pleuroparenchymal scarring and biapical bulla. Electronically Signed   By: Kellie Simmering D.O.   On: 12/26/2020 13:33   CT Chest W Contrast  Addendum Date: 12/27/2020   ADDENDUM REPORT: 12/27/2020 10:07 ADDENDUM: Upon reassessing imaging there is considerable artifact passing through the RIGHT lower chest and this study is venous phase. Despite images that show clear intraluminal low attenuation and high suspicion for pulmonary embolism there is question due to the artifact seen in this area. Furthermore, to help determine best future management in this complex patient would suggest a study performed in the appropriate phase, CT PE protocol with the arms up if possible. These results were called by telephone at the time of interpretation on 12/27/2020 at 10:07 am to provider Hosie Poisson MD, who verbally acknowledged these results. Electronically Signed   By: Zetta Bills M.D.   On: 12/27/2020 10:07   Result Date: 12/27/2020 CLINICAL DATA:  History of trauma, unwitnessed syncopal episode in an 80 year old male with history of pulmonary neoplasm. EXAM: CT CHEST WITH CONTRAST TECHNIQUE: Multidetector CT imaging of the chest was performed during intravenous contrast administration. CONTRAST:  47m OMNIPAQUE IOHEXOL 350 MG/ML SOLN COMPARISON:  December 24, 2020 chest CT. FINDINGS: Cardiovascular: The aorta is normal caliber. Central pulmonary vasculature is normal caliber. Filling defects in the RIGHT lower lobe  segmental and subsegmental branches to posterior basal segment (image 126/3) this is in an area of beam hardening artifact. The study is a venous phase evaluation not performed for PE assessment. No additional areas are demonstrated. Heart size is normal without pericardial effusion. There is straightening of the interventricular septum that was present previously. Mediastinum/Nodes: Interval decrease in size of proximally 2 cm LEFT paramediastinal mass contiguous with AP window soft tissue is stable in the short interval. Small lymph nodes throughout the remainder of the mediastinum likewise are unchanged. Lungs/Pleura: LEFT lower lobe nodularity and airspace process showing no change in the short interval. Signs of pulmonary emphysema and LEFT upper lobe nodule as discussed. Nodular changes have developed in the RIGHT lower lobe, multifocal nodularity since the previous study. Stable background subsolid nodule (image 119/6) 13 x 15 mm, this was not present in August of 2022 nor in September and is therefore favored to represent post inflammatory process. There is mild septal thickening in the RIGHT upper lobe that is new from recent imaging. Mild bronchial wall thickening is present throughout the chest. Upper Abdomen: Incidental imaging of upper abdominal contents without acute process. 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 No acute upper abdominal process. Musculoskeletal: Signs of subacute sternal fracture, sclerotic margins without change, no substantial displacement. Spinal degenerative changes. Costochondral elements are intact. No signs of displaced rib fracture. Visualized clavicles and scapulae are intact. IMPRESSION: Small RIGHT lower lobe pulmonary emboli. Straightening of the  interventricular septum is a stable finding and may relate to baseline elevated RIGHT heart pressures. Echocardiographic  correlation may be helpful as warranted. Worsening of multifocal pneumonia. Signs of primary lung cancer in the LEFT chest with mediastinal involvement as described previously. 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 Sternal fracture favored to be subacute is new since September of 2022. Not substantially changed since December 24, 2020. Aortic Atherosclerosis (ICD10-I70.0) and Emphysema (ICD10-J43.9). Critical Value/emergent results were called by telephone at the time of interpretation on 12/26/2020 at 1:42 pm to provider [facility] , who verbally acknowledged these results. Electronically Signed: By: Zetta Bills M.D. On: 12/26/2020 13:43   CT Angio Chest Pulmonary Embolism (PE) W or WO Contrast  Result Date: 12/27/2020 CLINICAL DATA:  PE suspected, high probability. Possible PE versus artifact identified incidentally on a CT scan of the chest with contrast from yesterday. EXAM: CT ANGIOGRAPHY CHEST WITH CONTRAST TECHNIQUE: Multidetector CT imaging of the chest was performed using the standard protocol during bolus administration of intravenous contrast. Multiplanar CT image reconstructions and MIPs were obtained to evaluate the vascular anatomy. CONTRAST:  79m OMNIPAQUE IOHEXOL 350 MG/ML SOLN COMPARISON:  CT scan of the chest 12/26/2020 FINDINGS: Cardiovascular: Satisfactory opacification of the pulmonary arteries to the segmental level. No evidence of pulmonary embolism. Normal heart size. The previously questioned pulmonary emboli are not evident on today's exam and almost certainly represented focal beam hardening artifact. No pericardial effusion. Aortic and coronary artery atherosclerotic calcifications. Mediastinum/Nodes: Stable left paramediastinal mass measuring approximately 2 cm in contiguous with soft tissue in the AP window. Additionally, small scattered mediastinal lymph nodes are also unchanged compared to recent prior imaging. Lungs/Pleura: Continued stability  of nonspecific left lower lobe nodularity and left upper lobe pulmonary nodule. Stable appearance of multifocal airspace opacifications in the right lower lobe. Background of moderately severe centrilobular pulmonary emphysema. Slightly increased right lower lobe atelectasis. No pleural effusion or pneumothorax. Overall, unchanged compared to yesterday. Upper Abdomen: No acute abnormality. Musculoskeletal: No acute abnormality. Review of the MIP images confirms the above findings. IMPRESSION: 1. Dedicated CTA imaging confirms that the previously suspected right lower lobe segmental pulmonary emboli were in fact artifactual. 2. Otherwise, unchanged appearance of multifocal pneumonia and primary left chest lung cancer with mediastinal involvement. Aortic Atherosclerosis (ICD10-I70.0) and Emphysema (ICD10-J43.9). Electronically Signed   By: HJacqulynn CadetM.D.   On: 12/27/2020 13:38   CT Angio Chest PE W and/or Wo Contrast  Result Date: 12/24/2020 CLINICAL DATA:  Difficulty breathing.  History of lung cancer. EXAM: CT ANGIOGRAPHY CHEST WITH CONTRAST TECHNIQUE: Multidetector CT imaging of the chest was performed using the standard protocol during bolus administration of intravenous contrast. Multiplanar CT image reconstructions and MIPs were obtained to evaluate the vascular anatomy. CONTRAST:  746mOMNIPAQUE IOHEXOL 350 MG/ML SOLN COMPARISON:  October 15, 2020.  October 15, 2013. FINDINGS: Cardiovascular: Satisfactory opacification of the pulmonary arteries to the segmental level. No evidence of pulmonary embolism. Normal heart size. No pericardial effusion. Mild coronary artery calcifications are noted. Mediastinum/Nodes: Thyroid gland and esophagus are unremarkable. 4.7 x 2.2 cm adenopathy is noted in aortopulmonary window. Lungs/Pleura: Emphysematous disease is noted. No pneumothorax or pleural effusion is noted. Patchy airspace opacities are noted in the left lower lobe concerning for pneumonia. 2.2 x  2.0 cm mass is noted anteriorly in the left upper lobe which does not appear to be significantly changed compared to prior exam and is concerning for malignancy. Several small ill-defined opacities are noted  in the right lower lobe which may represent focal inflammation. Upper Abdomen: No acute abnormality. Musculoskeletal: No chest wall abnormality. No acute or significant osseous findings. Review of the MIP images confirms the above findings. IMPRESSION: No definite evidence of pulmonary embolus. New patchy airspace opacities are noted in the left lower lobe and to a lesser degree in the right lower lobe, concerning for multifocal pneumonia. 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 Aortic Atherosclerosis (ICD10-I70.0) and Emphysema (ICD10-J43.9). Electronically Signed   By: Marijo Conception M.D.   On: 12/24/2020 16:50   CT Cervical Spine Wo Contrast  Result Date: 12/26/2020 CLINICAL DATA:  Neck trauma. Head trauma, minor. Additional history provided: Unwitnessed syncopal episode and fall. Patient reports left rib pain, difficulty taking deep breaths. EXAM: CT HEAD WITHOUT CONTRAST CT CERVICAL SPINE WITHOUT CONTRAST TECHNIQUE: Multidetector CT imaging of the head and cervical spine was performed following the standard protocol without intravenous contrast. Multiplanar CT image reconstructions of the cervical spine were also generated. COMPARISON:  Brain MRI 10/09/2020. FINDINGS: CT HEAD FINDINGS Brain: Mild-to-moderate generalized cerebral atrophy. Comparatively mild cerebellar atrophy. 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 Background moderate/advanced patchy and ill-defined hypoattenuation within the cerebral white matter,  nonspecific but compatible with chronic small vessel ischemic disease. There is no acute intracranial hemorrhage. No demarcated cortical infarct. No extra-axial fluid collection. No evidence of an intracranial mass. No midline shift. Vascular: No hyperdense vessel.  Atherosclerotic calcifications. Skull: Normal. Negative for fracture or focal lesion. Sinuses/Orbits: Visualized orbits show no acute finding. Mild mucosal thickening within the bilateral ethmoid and maxillary sinuses at the imaged levels. CT CERVICAL SPINE FINDINGS Alignment: Straightening of the expected cervical lordosis. No significant spondylolisthesis. Skull base and vertebrae: The basion-dental and atlanto-dental intervals are maintained.No evidence of acute fracture to the cervical spine. Soft tissues and spinal canal: No prevertebral fluid or swelling. No visible canal hematoma. Disc levels: Cervical spondylosis with multilevel disc space narrowing, disc bulges, posterior disc osteophytes, endplate spurring, uncovertebral hypertrophy and facet arthrosis. Disc space narrowing is greatest at C5-C6, C6-C7 and T2-T3 (moderate at these levels). No appreciable levels of severe spinal canal stenosis. Multilevel bony neural foraminal narrowing. Multilevel ventral osteophytes. Upper chest: No consolidation within the imaged lung apices. No visible pneumothorax. Centrilobular and paraseptal emphysema with biapical bulla. Biapical pleuroparenchymal scarring. IMPRESSION: CT head: 1. No evidence of acute intracranial abnormality. 2. 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 3. Mild paranasal sinus disease at the imaged levels. CT cervical spine: 1. No evidence of acute fracture to the cervical spine. 2. Straightening of the expected cervical lordosis. 3. Cervical spondylosis, as described. 4. Emphysema (ICD10-J43.9). Associated biapical  pleuroparenchymal scarring and biapical bulla. Electronically Signed   By: Kellie Simmering D.O.   On: 12/26/2020 13:33   ECHOCARDIOGRAM COMPLETE  Result Date: 12/27/2020    ECHOCARDIOGRAM REPORT   Patient Name:   Joe Hill Date of Exam: 12/27/2020 Medical Rec #:  [id number]      Height:       70.0 in Accession #:    [id number]     Weight:       173.5 lb Date of Birth:  29-Oct-1940      BSA:          1.965 m Patient Age:    89 years       BP:           106/73 mmHg Patient Gender: M              HR:           81 bpm. Exam Location:  Inpatient Procedure: 2D Echo, Cardiac Doppler and Color Doppler Indications:    Pulmonary Embolus I26.09  History:        Patient has no prior history of Echocardiogram examinations.                 COPD; Risk Factors:Hypertension.  Sonographer:    Bernadene Person RDCS Referring Phys: [id number] [place]  1. Left ventricular ejection fraction, by estimation, is 40 to 45%. The left ventricle has mildly decreased function. The left ventricle demonstrates global hypokinesis. The left ventricular internal cavity size was mildly dilated. Left ventricular diastolic function could not be evaluated.  2. Right ventricular systolic function is normal. The right ventricular size is normal. There is normal pulmonary artery systolic pressure.  3. Left atrial size was mildly dilated.  4. Right atrial size was mildly dilated.  5. The mitral valve is normal in structure. Trivial mitral valve regurgitation.  6. The aortic valve is calcified. There is mild calcification of the aortic valve. There is mild thickening of the aortic valve. Aortic valve regurgitation is mild to moderate.  7. Aortic dilatation noted. There is borderline dilatation of the ascending aorta, measuring 39 mm. FINDINGS  Left Ventricle: Left ventricular ejection fraction, by estimation, is 40 to 45%. The left ventricle has mildly decreased function. The left ventricle demonstrates global hypokinesis. The left  ventricular internal cavity size was mildly dilated. There is  no left ventricular hypertrophy. Left ventricular diastolic function could not be evaluated due to atrial fibrillation. Left ventricular diastolic function could not be evaluated. Right Ventricle: The right ventricular size is normal. Right vetricular wall thickness was not well visualized. Right ventricular systolic function is normal. There is normal pulmonary artery systolic pressure. The tricuspid regurgitant velocity is 2.18 m/s, and with an assumed right atrial pressure of 3 mmHg, the estimated right ventricular systolic pressure is 95.1 mmHg. Left Atrium: Left atrial size was mildly dilated. Right Atrium: Right atrial size was mildly dilated. Pericardium: There is no evidence of pericardial effusion. Mitral Valve: The mitral valve is normal in structure. Trivial mitral valve regurgitation. Tricuspid Valve: The tricuspid valve is grossly normal. Tricuspid valve regurgitation is trivial. Aortic Valve: The aortic valve is calcified. There is mild calcification of the aortic valve. There is mild thickening of the aortic valve. There is mild aortic valve annular calcification. Aortic valve regurgitation is mild to moderate. Aortic regurgitation PHT measures 326 msec. Pulmonic Valve: The pulmonic valve was not well visualized. Pulmonic valve regurgitation is not visualized. Aorta: Aortic dilatation noted. There is borderline dilatation of the ascending aorta, measuring 39 mm. IAS/Shunts: The atrial septum is grossly normal.  LEFT VENTRICLE PLAX 2D LVIDd:         5.00 cm LVIDs:         4.20 cm LV PW:         1.00 cm LV IVS:        1.00 cm LVOT diam:     2.20 cm LV SV:         69 LV  SV Index:   35 LVOT Area:     3.80 cm  LV Volumes (MOD) LV vol d, MOD A2C: 126.0 ml LV vol d, MOD A4C: 96.6 ml LV vol s, MOD A2C: 70.9 ml LV vol s, MOD A4C: 58.1 ml LV SV MOD A2C:     55.1 ml LV SV MOD A4C:     96.6 ml LV SV MOD BP:      49.7 ml RIGHT VENTRICLE TAPSE  (M-mode): 1.7 cm LEFT ATRIUM             Index        RIGHT ATRIUM           Index LA diam:        4.60 cm 2.34 cm/m   RA Area:     19.10 cm LA Vol (A2C):   62.9 ml 32.01 ml/m  RA Volume:   56.70 ml  28.86 ml/m LA Vol (A4C):   57.7 ml 29.36 ml/m LA Biplane Vol: 66.3 ml 33.74 ml/m  AORTIC VALVE LVOT Vmax:   99.30 cm/s LVOT Vmean:  66.200 cm/s LVOT VTI:    0.182 m AI PHT:      326 msec  AORTA Ao Root diam: 3.90 cm Ao Asc diam:  3.90 cm TRICUSPID VALVE TR Peak grad:   19.0 mmHg TR Vmax:        218.00 cm/s  SHUNTS Systemic VTI:  0.18 m Systemic Diam: 2.20 cm Mertie Moores MD Electronically signed by Mertie Moores MD Signature Date/Time: 12/27/2020/4:31:49 PM    Final     ASSESSMENT AND PLAN: This is a very pleasant 80 year old white male diagnosed with a stage IV (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 The patient started systemic chemotherapy with carboplatin for AUC of 5, Alimta 500 Mg/M2 and Keytruda 200 Mg IV every 3 weeks status post 2 cycles.  He is now admitted following a syncopal episode and he is being treated for pneumonia.  CBC from today has been reviewed and WBC remains low at 0.9 with ANC of 0.3, hemoglobin is down slightly to 7.8, and platelets are 18,000.  He will continue Granix daily until [place] is 1.5 or higher.  He has an active nosebleed this morning.  We will give 1 unit of platelets and 1 unit of PRBCs today.  Risk/benefits of transfusion have been discussed with the patient.  He agrees to proceed. Recommend PRBC transfusion for hemoglobin less than 8 or platelet count less than 20,000 or active bleeding.  Agree with holding anticoagulation until platelet count is 50,000 or higher consistently without the  transfusion.  He remains disappointed that he will have to stay in the hospital.  I explained the rationale for him remaining in the hospital.  Future Appointments  Date Time Provider [place]  01/01/2021  4:00 PM [facility] None  01/08/2021  9:00 AM [facility] None  01/08/2021  9:30 AM Curt Bears, MD [facility] None  01/08/2021 10:30 AM [facility] INFUSION [facility] None  01/15/2021  4:00 PM [facility] None  01/22/2021  4:00 PM [facility] None  01/29/2021 10:00 AM [facility] None  01/29/2021 10:30 AM Heilingoetter, Cassandra L, PA-C [facility] None  01/29/2021 11:30 AM  [facility] INFUSION [facility] None  01/30/2021  4:00 PM Evans Lance, MD [facility]  02/04/2021  4:00 PM [facility] None  02/11/2021  4:00 PM [facility] None  02/18/2021 10:00 AM [facility] None  02/18/2021 10:30 AM Curt Bears, MD [facility] None  02/18/2021 11:30 AM [facility] INFUSION [facility] None  02/25/2021  4:00 PM [facility] None  03/04/2021  4:00 PM [facility] None  03/11/2021 10:00 AM [facility] None  03/11/2021 10:30 AM Heilingoetter, Cassandra L, PA-C [facility] None  03/11/2021 11:30 AM [facility] INFUSION [facility] None  05/12/2021  8:20 AM Yong Channel, Brayton Mars, MD [facility]      LOS: 3 days   Mikey Bussing, DNP, AGPCNP-BC, AOCNP 12/30/20

## 2020-12-31 LAB — TYPE AND SCREEN
ABO/RH(D): O POS
Antibody Screen: NEGATIVE
Unit division: 0

## 2020-12-31 LAB — BASIC METABOLIC PANEL
Anion gap: 7 (ref 5–15)
BUN: 18 mg/dL (ref 8–23)
CO2: 27 mmol/L (ref 22–32)
Calcium: 8.4 mg/dL — ABNORMAL LOW (ref 8.9–10.3)
Chloride: 102 mmol/L (ref 98–111)
Creatinine, Ser: 0.93 mg/dL (ref 0.61–1.24)
GFR, Estimated: >60 mL/min (ref >60)
Glucose, Bld: 101 mg/dL — ABNORMAL HIGH (ref 70–99)
Potassium: 3 mmol/L — ABNORMAL LOW (ref 3.5–5.1)
Sodium: 136 mmol/L (ref 135–145)

## 2020-12-31 LAB — CULTURE, BLOOD (ROUTINE X 2)
Culture: NO GROWTH
Culture: NO GROWTH
Special Requests: ADEQUATE
Special Requests: ADEQUATE

## 2020-12-31 LAB — CBC WITH DIFFERENTIAL/PLATELET
Abs Immature Granulocytes: 0 10*3/uL (ref 0.00–0.07)
Basophils Absolute: 0 10*3/uL (ref 0.0–0.1)
Basophils Relative: 0 %
Eosinophils Absolute: 0 10*3/uL (ref 0.0–0.5)
Eosinophils Relative: 1 %
HCT: 22.9 % — ABNORMAL LOW (ref 39.0–52.0)
Hemoglobin: 7.8 g/dL — ABNORMAL LOW (ref 13.0–17.0)
Lymphocytes Relative: 39 %
Lymphs Abs: 0.4 10*3/uL — ABNORMAL LOW (ref 0.7–4.0)
MCH: 31.1 pg (ref 26.0–34.0)
MCHC: 34.1 g/dL (ref 30.0–36.0)
MCV: 91.2 fL (ref 80.0–100.0)
Monocytes Absolute: 0.3 10*3/uL (ref 0.1–1.0)
Monocytes Relative: 23 %
Myelocytes: 4 %
Neutro Abs: 0.4 10*3/uL — CL (ref 1.7–7.7)
Neutrophils Relative %: 33 %
Platelets: 23 10*3/uL — CL (ref 150–400)
RBC: 2.51 MIL/uL — ABNORMAL LOW (ref 4.22–5.81)
RDW: 13.2 % (ref 11.5–15.5)
WBC: 1.1 10*3/uL — CL (ref 4.0–10.5)
nRBC: 2.9 % — ABNORMAL HIGH (ref 0.0–0.2)

## 2020-12-31 LAB — BPAM PLATELET PHERESIS
Blood Product Expiration Date: 202212092359
ISSUE DATE / TIME: 202212061549
Unit Type and Rh: 7300

## 2020-12-31 LAB — BPAM RBC
Blood Product Expiration Date: 202301032359
ISSUE DATE / TIME: 202212062102
Unit Type and Rh: 5100

## 2020-12-31 LAB — PREPARE PLATELET PHERESIS: Unit division: 0

## 2020-12-31 MED ORDER — POTASSIUM CHLORIDE CRYS ER 20 MEQ PO TBCR
30.0000 meq | EXTENDED_RELEASE_TABLET | ORAL | Status: AC
  Administered 2020-12-31 (×3): 30 meq via ORAL
  Filled 2020-12-31 (×3): qty 1

## 2020-12-31 NOTE — Progress Notes (Signed)
   12/31/20 1000  Provider Notification  Provider Name/Title Dr. [place]  Date Provider Notified 12/31/20  Time Provider Notified 1005  Notification Type Page  Notification Reason Critical result  Test performed and critical result Absolute Neutrophil 0.4  Date Critical Result Received 12/31/20  Time Critical Result Received 1000  Provider response No new orders  Date of Provider Response 12/31/20  Time of Provider Response 1030  Absolute Neutrophil count of 0.4. No new orders.

## 2020-12-31 NOTE — Care Management Important Message (Signed)
Important Message  Patient Details IM Letter given to the Patient. Name: Joe Hill MRN: [id number] Date of Birth: Nov 08, 1940   Medicare Important Message Given:  Yes     Kerin Salen 12/31/2020, 11:53 AM

## 2020-12-31 NOTE — Progress Notes (Signed)
PROGRESS NOTE    Joe Hill  JJH:[id number] DOB: [birth year] DOA: 12/26/2020 PCP: [name], MD    Brief Narrative:  Joe Hill is an 80 year old male with past medical history significant for stage IV NSCLC, bilateral adrenal, mets, adenocarcinoma, COPD, GERD, HTN, paroxysmal atrial fibrillation, tobacco use disorder, prostate cancer, who presented to [facility] ED via EMS on 12/2 following unwitnessed syncopal episode and collapse at home.  Patient does not recall any prodromal symptoms prior to passing out and just remembers being on the floor.  In the ED, temperature 97.4 F, HR 104, RR 18, BP 102/67, SPO2 100% on room air.  Urinalysis with rare bacteria, otherwise unrevealing.  WBC 2.6, hemoglobin 10.5, platelets 119.  Potassium 3.3, glucose 29, BUN 54, creatinine 1.14.  CT head without contrast with no acute intracranial abnormality.  CT C-spine with no acute fracture/subluxation.  CTA chest with small right lower lobe pulmonary emboli, worsening of multifocal pneumonia and signs of primary lung cancer in the left chest with mediastinal involvement.  Patient received 2 L NS bolus, Dilaudid 1 mg IV PE, morphine 4 mg IVP, Zofran IVP, ceftriaxone and azithromycin.  Patient was started on a heparin drip.  TRH consulted for further evaluation and management of syncope, A. fib with RVR, and multifocal pneumonia.   Assessment & Plan:   Principal Problem:   Syncope and collapse Active Problems:   Essential hypertension   GERD   Hyperglycemia   Dyslipidemia   Aortic atherosclerosis (HCC)   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HCC)   Pancytopenia (HCC)   Hypokalemia   Borderline prolonged QT interval   Multifocal pneumonia   Pulmonary embolism (HCC)   Atrial fibrillation with RVR (HCC)   Chemotherapy-induced neutropenia (HCC)   Pancytopenia Etiology likely secondary to chemotherapy. --Oncology following, appreciate assistance --s/p 1 unit PRBC and 1 unit platelets 12/6 --WBC  2.6>2.1>0.9>0.8>0.9>1.1 --ANC 2.1>0.3>0.2>0.3>0.4 --Plt 119>99>51>30>18>23 --Granix --Transfuse for hemoglobin < 7.0, platelet count < 20 or active bleeding --Continue monitor CBC with differential daily  Syncope Patient presenting from home with unwitnessed syncopal episode of unclear etiology.  Suspect likely related to respiratory failure and multifocal pneumonia.  CT head without contrast with no evidence of acute intracranial abnormality.  Repeat CT angiogram chest negative for pulmonary embolism.  TTE with LVEF 40-45%, LV mildly decreased function with global hypokinesis, LV mildly dilated, LA/RA mildly dilated, mild/moderate AR, aortic dilation 39 mm. --Continue to monitor for arrhythmia on telemetry, none noted/far during hospitalization  Paroxysmal atrial fibrillation with RVR --Cardizem CD 240 mg p.o. daily --Not on anticoagulation due to pancytopenia/thrombocytopenia  Acute hypoxic respiratory failure, POA: Resolved Multifocal pneumonia Patient requiring submental oxygen on arrival.  Not oxygen dependent outpatient.  CT chest with contrast on admission with mild bronchial wall thickening, multifocal airspace opacifications right lower lobe.  Patient completed 5-day course of azithromycin and ceftriaxone.  Oxygen has been weaned off.  Pulmonary embolism; ruled out Initial CT a chest notable for small right lower lobe pulmonary emboli, repeat CTA study negative for PE.  Remains on Eliquis as above for atrial fibrillation.  Stage IV ([id number]) non-small cell lung cancer, adenocarcinoma with bilateral mediastinal adenopathy, adrenal metastases Follows with medical oncology outpatient, Dr. [name].  Currently on systemic chemotherapy with carboplatin for AUC of 5, Alimta 500 Mg/M2 and Keytruda 200 Mg IV every 3 weeks, status post 2 cycles, last cycle on 12/17/20. --Oncology following as above  GERD: Protonix 40 mg p.o. daily   DVT prophylaxis: SCDs   Code Status: Full  Code Family Communication: No family present at bedside this morning  Disposition Plan:  Level of care: Telemetry Status is: Inpatient  Remains inpatient appropriate because: Continues with pancytopenia with neutropenia requiring Granix.     Consultants:  Medical oncology  Procedures:  TTE  Antimicrobials:  Azithromycin 12/2 - 12/6 Ceftriaxone 12/2 -12/7    Subjective: Patient seen examined at bedside, resting comfortably.  Remains neutropenic.  Continues on Granix.  No other complaints or concerns at this time other than wishes he could go home today.  Denies headache, no visual changes, no chest pain, palpitations, shortness of breath, no abdominal pain, no weakness, no fatigue, no paresthesias.  No acute events overnight per nursing.  Objective: Vitals:   12/30/20 2115 12/30/20 2123 12/30/20 2315 12/31/20 1239  BP: (!) 100/56 (!) 100/56 118/64 100/60  Pulse: 88 88 81 (!) 59  Resp: 18 18 18 18   Temp: 98.9 F (37.2 C) 98.9 F (37.2 C) 98.1 F (36.7 C) 98.1 F (36.7 C)  TempSrc: Oral  Oral Oral  SpO2: 98%  99% 100%  Weight:      Height:        Intake/Output Summary (Last 24 hours) at 12/31/2020 1623 Last data filed at 12/31/2020 1549 Gross per 24 hour  Intake 932.5 ml  Output 870 ml  Net 62.5 ml   Filed Weights   12/26/20 1721  Weight: 78.7 kg    Examination:  General exam: Appears calm and comfortable  Respiratory system: Clear to auscultation. Respiratory effort normal.  On room air Cardiovascular system: S1 & S2 heard, RRR. No JVD, murmurs, rubs, gallops or clicks. No pedal edema. Gastrointestinal system: Abdomen is nondistended, soft and nontender. No organomegaly or masses felt. Normal bowel sounds heard. Central nervous system: Alert and oriented. No focal neurological deficits. Extremities: Symmetric 5 x 5 power. Skin: No rashes, lesions or ulcers Psychiatry: Judgement and insight appear normal. Mood & affect appropriate.     Data Reviewed: I  have personally reviewed following labs and imaging studies  CBC: Recent Labs  Lab 12/26/20 1120 12/27/20 0005 12/28/20 0138 12/29/20 0955 12/30/20 0915 12/31/20 0759  WBC 2.6* 2.1* 0.9* 0.8* 0.9* 1.1*  NEUTROABS 2.1  --  0.3* 0.2* 0.3* 0.4*  HGB 10.5* 10.0* 8.4* 8.3* 7.8* 7.8*  HCT 30.2* 28.5* 23.8* 24.0* 21.7* 22.9*  MCV 93.2 92.5 91.9 91.6 90.8 91.2  PLT 119* 99* 51* 30* 18* 23*   Basic Metabolic Panel: Recent Labs  Lab 12/26/20 1120 12/26/20 1406 12/27/20 0005 12/28/20 0138 12/28/20 1020 12/29/20 0444 12/31/20 0508  NA 135  --  137 136  --  137 136  K 3.3*  --  3.6 3.1*  --  3.7 3.0*  CL 100  --  109 108  --  108 102  CO2 25  --  24 23  --  24 27  GLUCOSE 189*  --  109* 115*  --  95 101*  BUN 54*  --  37* 25*  --  16 18  CREATININE 1.14  --  0.93 0.93  --  0.86 0.93  CALCIUM 8.2*  --  8.0* 7.6*  --  8.0* 8.4*  MG  --  2.0  --   --  1.9  --   --   PHOS  --  3.1  --   --   --   --   --    GFR: Estimated Creatinine Clearance: 65.4 mL/min (by C-G formula based on SCr of 0.93 mg/dL). Liver Function  Tests: Recent Labs  Lab 12/25/20 1555 12/27/20 0005  AST 40 31  ALT 63* 49*  ALKPHOS 121 84  BILITOT 1.2 1.0  PROT 6.7 5.8*  ALBUMIN 3.5 3.1*   No results for input(s): LIPASE, AMYLASE in the last 168 hours. No results for input(s): AMMONIA in the last 168 hours. Coagulation Profile: No results for input(s): INR, PROTIME in the last 168 hours. Cardiac Enzymes: No results for input(s): CKTOTAL, CKMB, CKMBINDEX, TROPONINI in the last 168 hours. BNP (last 3 results) No results for input(s): PROBNP in the last 8760 hours. HbA1C: No results for input(s): HGBA1C in the last 72 hours. CBG: Recent Labs  Lab 12/26/20 1128  GLUCAP 193*   Lipid Profile: No results for input(s): CHOL, HDL, LDLCALC, TRIG, CHOLHDL, LDLDIRECT in the last 72 hours. Thyroid Function Tests: No results for input(s): TSH, T4TOTAL, FREET4, T3FREE, THYROIDAB in the last 72 hours. Anemia  Panel: No results for input(s): VITAMINB12, FOLATE, FERRITIN, TIBC, IRON, RETICCTPCT in the last 72 hours. Sepsis Labs: Recent Labs  Lab 12/26/20 1430  LATICACIDVEN 1.1    Recent Results (from the past 240 hour(s))  Resp Panel by RT-PCR (Flu A&B, Covid) Nasopharyngeal Swab     Status: None   Collection Time: 12/24/20 12:03 PM   Specimen: Nasopharyngeal Swab; Nasopharyngeal(NP) swabs in vial transport medium  Result Value Ref Range Status   SARS Coronavirus 2 by RT PCR NEGATIVE NEGATIVE Final    Comment: (NOTE) SARS-CoV-2 target nucleic acids are NOT DETECTED.  The SARS-CoV-2 RNA is generally detectable in upper respiratory specimens during the acute phase of infection. The lowest concentration of SARS-CoV-2 viral copies this assay can detect is 138 copies/mL. A negative result does not preclude SARS-Cov-2 infection and should not be used as the sole basis for treatment or other patient management decisions. A negative result may occur with  improper specimen collection/handling, submission of specimen other than nasopharyngeal swab, presence of viral mutation(s) within the areas targeted by this assay, and inadequate number of viral copies(<138 copies/mL). A negative result must be combined with clinical observations, patient history, and epidemiological information. The expected result is Negative.  Fact Sheet for Patients:  EntrepreneurPulse.com.au  Fact Sheet for Healthcare Providers:  IncredibleEmployment.be  This test is no t yet approved or cleared by the [place] FDA and  has been authorized for detection and/or diagnosis of SARS-CoV-2 by FDA under an Emergency Use Authorization (EUA). This EUA will remain  in effect (meaning this test can be used) for the duration of the COVID-19 declaration under Section 564(b)(1) of the Act, 21 U.S.C.section 360bbb-3(b)(1), unless the authorization is terminated  or revoked sooner.        Influenza A by PCR NEGATIVE NEGATIVE Final   Influenza B by PCR NEGATIVE NEGATIVE Final    Comment: (NOTE) The Xpert Xpress SARS-CoV-2/FLU/RSV plus assay is intended as an aid in the diagnosis of influenza from Nasopharyngeal swab specimens and should not be used as a sole basis for treatment. Nasal washings and aspirates are unacceptable for Xpert Xpress SARS-CoV-2/FLU/RSV testing.  Fact Sheet for Patients: EntrepreneurPulse.com.au  Fact Sheet for Healthcare Providers: IncredibleEmployment.be  This test is not yet approved or cleared by the [place] FDA and has been authorized for detection and/or diagnosis of SARS-CoV-2 by FDA under an Emergency Use Authorization (EUA). This EUA will remain in effect (meaning this test can be used) for the duration of the COVID-19 declaration under Section 564(b)(1) of the Act, 21 U.S.C. section 360bbb-3(b)(1), unless the authorization  is terminated or revoked.  Performed at [facility], [place] [address], [place], [place] [address]   Blood culture (routine x 2)     Status: None   Collection Time: 12/26/20  3:50 PM   Specimen: BLOOD  Result Value Ref Range Status   Specimen Description   Final    BLOOD BLOOD RIGHT ARM Performed at [place] [address], [place], [place] [address]    Special Requests   Final    BOTTLES DRAWN AEROBIC AND ANAEROBIC Blood Culture adequate volume Performed at [place] [address], [place], [place] [address]    Culture   Final    NO GROWTH 5 DAYS Performed at [facility], [place] [address] [place], [place] [address]    Report Status 12/31/2020 FINAL  Final  Blood culture (routine x 2)     Status: None   Collection Time: 12/26/20  3:52 PM   Specimen: BLOOD  Result Value Ref Range Status   Specimen Description   Final    BLOOD BLOOD RIGHT HAND Performed at [place] [address], [place], [place] [address]    Special Requests   Final    BOTTLES DRAWN AEROBIC AND ANAEROBIC Blood Culture adequate volume Performed at [place] [address], [place],  [address]    Culture   Final    NO GROWTH 5 DAYS Performed at [facility], [place] [address], [place],  [address]    Report Status 12/31/2020 FINAL  Final         Radiology Studies: No results found.      Scheduled Meds:  diltiazem  240 mg Oral Daily   feeding supplement  237 mL Oral BID BM   folic acid  1 mg Oral Daily   pantoprazole  40 mg Oral Daily   Tbo-filgastrim (GRANIX) SQ  480 mcg Subcutaneous q1800   Continuous Infusions:   LOS: 4 days    Time spent: 38 minutes spent on chart review, discussion with nursing staff, consultants, updating family and interview/physical exam; more than 50% of that time was spent in counseling and/or coordination of care.    Radley Barto J [place] DO [facility] Available via Epic secure chat 7am-7pm After these hours, please refer to coverage provider listed on [email or web address] 12/31/2020, 4:23 PM

## 2020-12-31 NOTE — Plan of Care (Signed)
  Problem: Education: Goal: Knowledge of General Education information will improve Description: Including pain rating scale, medication(s)/side effects and non-pharmacologic comfort measures Outcome: Progressing   Problem: Health Behavior/Discharge Planning: Goal: Ability to manage health-related needs will improve Outcome: Progressing   Problem: Clinical Measurements: Goal: Ability to maintain clinical measurements within normal limits will improve Outcome: Progressing Goal: Will remain free from infection Outcome: Progressing Goal: Diagnostic test results will improve Outcome: Progressing Goal: Respiratory complications will improve Outcome: Progressing Goal: Cardiovascular complication will be avoided Outcome: Progressing   Problem: Activity: Goal: Risk for activity intolerance will decrease Outcome: Progressing   Problem: Nutrition: Goal: Adequate nutrition will be maintained Outcome: Progressing   Problem: Coping: Goal: Level of anxiety will decrease Outcome: Progressing   Problem: Elimination: Goal: Will not experience complications related to urinary retention Outcome: Progressing   Problem: Pain Managment: Goal: General experience of comfort will improve Outcome: Progressing   Problem: Safety: Goal: Ability to remain free from injury will improve Outcome: Progressing   Problem: Elimination: Goal: Will not experience complications related to bowel motility Outcome: Not Progressing

## 2021-01-01 ENCOUNTER — Other Ambulatory Visit: Payer: Self-pay | Admitting: Physician Assistant

## 2021-01-01 ENCOUNTER — Inpatient Hospital Stay (HOSPITAL_COMMUNITY): Payer: Medicare Other

## 2021-01-01 ENCOUNTER — Telehealth: Payer: Self-pay | Admitting: Physician Assistant

## 2021-01-01 ENCOUNTER — Inpatient Hospital Stay: Payer: Medicare Other

## 2021-01-01 DIAGNOSIS — D61818 Other pancytopenia: Secondary | ICD-10-CM | POA: Diagnosis not present

## 2021-01-01 DIAGNOSIS — R609 Edema, unspecified: Secondary | ICD-10-CM

## 2021-01-01 DIAGNOSIS — D701 Agranulocytosis secondary to cancer chemotherapy: Secondary | ICD-10-CM | POA: Diagnosis not present

## 2021-01-01 DIAGNOSIS — T451X5A Adverse effect of antineoplastic and immunosuppressive drugs, initial encounter: Secondary | ICD-10-CM | POA: Diagnosis not present

## 2021-01-01 LAB — BASIC METABOLIC PANEL
Anion gap: 5 (ref 5–15)
BUN: 16 mg/dL (ref 8–23)
CO2: 27 mmol/L (ref 22–32)
Calcium: 8.5 mg/dL — ABNORMAL LOW (ref 8.9–10.3)
Chloride: 103 mmol/L (ref 98–111)
Creatinine, Ser: 0.82 mg/dL (ref 0.61–1.24)
GFR, Estimated: >60 mL/min (ref >60)
Glucose, Bld: 108 mg/dL — ABNORMAL HIGH (ref 70–99)
Potassium: 3.1 mmol/L — ABNORMAL LOW (ref 3.5–5.1)
Sodium: 135 mmol/L (ref 135–145)

## 2021-01-01 LAB — CBC WITH DIFFERENTIAL/PLATELET
Abs Immature Granulocytes: 0.3 10*3/uL — ABNORMAL HIGH (ref 0.00–0.07)
Basophils Absolute: 0 10*3/uL (ref 0.0–0.1)
Basophils Relative: 1 %
Eosinophils Absolute: 0 10*3/uL (ref 0.0–0.5)
Eosinophils Relative: 0 %
HCT: 24.4 % — ABNORMAL LOW (ref 39.0–52.0)
Hemoglobin: 8.4 g/dL — ABNORMAL LOW (ref 13.0–17.0)
Immature Granulocytes: 11 %
Lymphocytes Relative: 18 %
Lymphs Abs: 0.5 10*3/uL — ABNORMAL LOW (ref 0.7–4.0)
MCH: 31.5 pg (ref 26.0–34.0)
MCHC: 34.4 g/dL (ref 30.0–36.0)
MCV: 91.4 fL (ref 80.0–100.0)
Monocytes Absolute: 0.8 10*3/uL (ref 0.1–1.0)
Monocytes Relative: 28 %
Neutro Abs: 1.2 10*3/uL — ABNORMAL LOW (ref 1.7–7.7)
Neutrophils Relative %: 42 %
Platelets: 32 10*3/uL — ABNORMAL LOW (ref 150–400)
RBC: 2.67 MIL/uL — ABNORMAL LOW (ref 4.22–5.81)
RDW: 13.6 % (ref 11.5–15.5)
WBC: 2.8 10*3/uL — ABNORMAL LOW (ref 4.0–10.5)
nRBC: 4 % — ABNORMAL HIGH (ref 0.0–0.2)

## 2021-01-01 LAB — MAGNESIUM: Magnesium: 1.6 mg/dL — ABNORMAL LOW (ref 1.7–2.4)

## 2021-01-01 MED ORDER — POTASSIUM CHLORIDE CRYS ER 20 MEQ PO TBCR
30.0000 meq | EXTENDED_RELEASE_TABLET | ORAL | Status: AC
  Administered 2021-01-01 (×3): 30 meq via ORAL
  Filled 2021-01-01 (×3): qty 1

## 2021-01-01 MED ORDER — PANTOPRAZOLE SODIUM 40 MG PO TBEC
40.0000 mg | DELAYED_RELEASE_TABLET | Freq: Every day | ORAL | 2 refills | Status: AC
Start: 2021-01-02 — End: 2021-04-02

## 2021-01-01 MED ORDER — MAGNESIUM SULFATE 4 GM/100ML IV SOLN
4.0000 g | Freq: Once | INTRAVENOUS | Status: AC
  Administered 2021-01-01: 4 g via INTRAVENOUS
  Filled 2021-01-01: qty 100

## 2021-01-01 MED ORDER — FUROSEMIDE 40 MG PO TABS: 40.0000 mg | ORAL_TABLET | Freq: Every day | ORAL | 0 refills | Status: DC

## 2021-01-01 MED ORDER — DILTIAZEM HCL ER COATED BEADS 240 MG PO CP24: 240.0000 mg | ORAL_CAPSULE | Freq: Every day | ORAL | 2 refills | Status: DC

## 2021-01-01 MED ORDER — FUROSEMIDE 10 MG/ML IJ SOLN
40.0000 mg | Freq: Once | INTRAMUSCULAR | Status: AC
  Administered 2021-01-01: 40 mg via INTRAVENOUS
  Filled 2021-01-01: qty 4

## 2021-01-01 NOTE — Plan of Care (Signed)
  Problem: Education: Goal: Knowledge of General Education information will improve Description: Including pain rating scale, medication(s)/side effects and non-pharmacologic comfort measures Outcome: Progressing   Problem: Health Behavior/Discharge Planning: Goal: Ability to manage health-related needs will improve Outcome: Progressing   Problem: Clinical Measurements: Goal: Will remain free from infection Outcome: Progressing Goal: Diagnostic test results will improve Outcome: Progressing Goal: Respiratory complications will improve Outcome: Progressing Goal: Cardiovascular complication will be avoided Outcome: Progressing   Problem: Activity: Goal: Risk for activity intolerance will decrease Outcome: Progressing   Problem: Nutrition: Goal: Adequate nutrition will be maintained Outcome: Progressing   Problem: Coping: Goal: Level of anxiety will decrease Outcome: Progressing   Problem: Elimination: Goal: Will not experience complications related to urinary retention Outcome: Progressing   Problem: Pain Managment: Goal: General experience of comfort will improve Outcome: Progressing   Problem: Clinical Measurements: Goal: Ability to maintain clinical measurements within normal limits will improve Outcome: Not Progressing   Problem: Elimination: Goal: Will not experience complications related to bowel motility Outcome: Not Progressing   Problem: Safety: Goal: Ability to remain free from injury will improve Outcome: Not Progressing   Problem: Skin Integrity: Goal: Risk for impaired skin integrity will decrease Outcome: Not Progressing

## 2021-01-01 NOTE — Progress Notes (Signed)
[place] [facility] OFFICE PROGRESS NOTE  [name], MD [place] [address]  DIAGNOSIS: Stage IV (T2 a, N3, M1 B) non-small cell lung cancer, adenocarcinoma presented with large central left upper lobe lung mass in addition to bilateral mediastinal lymphadenopathy and right upper lobe metastatic nodule as well as left adrenal metastasis diagnosed in October 2022.  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   Molecular studies showed no actionable mutations.   PDL1 Expression 5%  PRIOR THERAPY: None  CURRENT THERAPY: Systemic chemotherapy with carboplatin for AUC of 5, Alimta 500 Mg/M2 and Keytruda 200 Mg IV every 3 weeks, status post 2 cycles.   INTERVAL HISTORY: Joe Hill 80 y.o. male returns to the clinic today for a follow-up visit.  The patient is recently diagnosed with stage IV lung cancer.  He is currently undergoing palliative systemic chemotherapy.  He underwent cycle #2 on 12/17/2020.  The patient was tolerating treatment fair except for fatigue and he  developed pancytopenia following treatment.  With cycle #1, the patient neutropenia which required to short acting G-CSF injections on 12/12/2020 and 12/13/2020.  The patient underwent cycle #2 on 12/17/2020.  The patient presented to the emergency room on 12/24/2020 for the chief complaint of an unwitnessed syncopal episode.  The patient had a small PE as well as as multifocal pneumonia.  The patient also had A. fib with RVR.  During the course of admission, the patient had pancytopenia which required 1 unit of blood and 1 unit of platelets.  The patient also developed neutropenia which required Granix injections 480 daily for 4 days from 12/5-12/8.  The patient completed antibiotics through the course of his hospital admission.  The patient also was put on a heparin drip.  The patient is going to  start Eliquis for the recent PE; however, this is on hold due to the patient's anemia and thrombocytopenia.  We will resume this once the patient's platelet count is greater than 50,000.  The patient denies any abnormal bleeding at this time. He had been having nosebleed during the admission with forceful blowing but he stated this "just about quit" now. He has bruises on his upper extremities.   The patient was discharged from the hospital yesterday.  Since being discharged, the patient is feeling "pretty good" and reports he slept better last night. He continues to have hoarseness due to his malignancy involving the recurrent laryngeal nerve.  The patient denies any other signs and symptoms of infection including skin infections, dysuria, diarrhea, abdominal pain, or sore throat.  He reports some occasional nasal congestion for which he uses mucinex. The patient is here today for evaluation and repeat blood work.  The patient is scheduled to start cycle #3 next week.  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  MEDICATIONS:  Current Outpatient Medications  Medication Sig Dispense Refill   ADVIL 200 MG CAPS Take 200 mg by mouth every 6 (six) hours as needed (for mild pain or headaches).     albuterol (VENTOLIN HFA) 108 (90 Base)  MCG/ACT inhaler Inhale 2 puffs into the lungs every 6 (six) hours as needed for wheezing (or to aid in the removal of phlegm).     dextromethorphan-guaiFENesin (MUCINEX DM) 30-600 MG 12hr tablet Take 1 tablet by mouth 2 (two) times daily as needed for cough.     diltiazem (CARDIZEM CD) 240 MG 24 hr capsule Take 1 capsule (240 mg  total) by mouth daily. 30 capsule 2   feeding supplement (BOOST HIGH PROTEIN) LIQD Take 1 Container by mouth 2 (two) times daily.     folic acid (FOLVITE) 1 MG tablet Take 1 tablet (1 mg total) by mouth daily. 30 tablet 4   furosemide (LASIX) 40 MG tablet Take 1 tablet (40 mg total) by mouth daily for 7 days. 7 tablet 0   Nutritional Supplements (ENSURE COMPLETE PO) Take 237 mLs by mouth See admin instructions. Drink 237 ml's by mouth one to two times a day     pantoprazole (PROTONIX) 40 MG tablet Take 1 tablet (40 mg total) by mouth daily. 30 tablet 2   potassium chloride SA (KLOR-CON) 20 MEQ tablet Take 1 tablet (20 mEq total) by mouth daily. Take 2 tablets (83meq) for 2 days then take 1 tablet (75meq) daily (Patient taking differently: Take 20 mEq by mouth daily.) 90 tablet 3   prochlorperazine (COMPAZINE) 10 MG tablet Take 1 tablet (10 mg total) by mouth every 6 (six) hours as needed for nausea or vomiting. 30 tablet 0   No current facility-administered medications for this visit.    SURGICAL HISTORY:  Past Surgical History:  Procedure Laterality Date   APPENDECTOMY     CATARACT EXTRACTION     PROSTATE SURGERY     prostatectomy   VIDEO BRONCHOSCOPY WITH ENDOBRONCHIAL NAVIGATION N/A 10/30/2020   Procedure: VIDEO BRONCHOSCOPY WITH ENDOBRONCHIAL NAVIGATION;  Surgeon: Melrose Nakayama, MD;  Location: [facility];  Service: Thoracic;  Laterality: N/A;   VIDEO BRONCHOSCOPY WITH ENDOBRONCHIAL ULTRASOUND N/A 10/30/2020   Procedure: VIDEO BRONCHOSCOPY WITH ENDOBRONCHIAL ULTRASOUND;  Surgeon: Melrose Nakayama, MD;  Location: [facility];  Service: Thoracic;  Laterality: N/A;    REVIEW OF SYSTEMS:   Review of Systems  Constitutional: Positive for fatigue. Negative for appetite change, chills, fever and unexpected weight change.  HENT: Positive for hoarseness. Positive for nosebleeds (none today). Negative for mouth sores, sore throat and trouble swallowing.   Eyes: Negative for eye problems and  icterus.  Respiratory: Positive for shortness of breath. Negative for cough, hemoptysis,  and wheezing.   Cardiovascular: Negative for chest pain and leg swelling.  Gastrointestinal: Negative for abdominal pain, constipation, diarrhea, nausea and vomiting.  Genitourinary: Negative for bladder incontinence, difficulty urinating, dysuria, frequency and hematuria.   Musculoskeletal: Negative for back pain, gait problem, neck pain and neck stiffness.  Skin: Negative for itching and rash.  Neurological: Negative for dizziness, extremity weakness, gait problem, headaches, light-headedness and seizures.  Hematological: Negative for adenopathy. Positive for extremity bruising.  Psychiatric/Behavioral: Negative for confusion, depression and sleep disturbance. The patient is not nervous/anxious.     PHYSICAL EXAMINATION:  Blood pressure (!) 92/58, pulse 97, temperature 97.7 F (36.5 C), temperature source Temporal, resp. rate 17, height $RemoveBe'5\' 10"'[id number]$  (1.778 m), weight 184 lb 6.4 oz (83.6 kg), SpO2 98 %.  ECOG PERFORMANCE STATUS: 2  Physical Exam  Constitutional: Oriented to person, place, and time and thin appearing male, and in no distress.  HENT:  Head: Normocephalic and atraumatic.  Mouth/Throat: Positive for hoarseness. Oropharynx is clear and moist. No  oropharyngeal exudate.  Eyes: Conjunctivae are normal. Right eye exhibits no discharge. Left eye exhibits no discharge. No scleral icterus.  Neck: Normal range of motion. Neck supple.  Cardiovascular: Normal rate, regular rhythm, normal heart sounds and intact distal pulses.   Pulmonary/Chest: Effort normal and breath sounds normal. No respiratory distress. No wheezes. No rales.  Abdominal: Soft. Bowel sounds are normal. Exhibits no distension and no mass. There is no tenderness.  Musculoskeletal: Normal range of motion. Exhibits no edema.  Lymphadenopathy:    No cervical adenopathy.  Neurological: Alert and oriented to person, place, and time.  Exhibits muscle wasting. Examined in the wheelchair.  Skin: Has bruising on upper extremities. Skin is warm and dry. No rash noted. Not diaphoretic. No erythema. No pallor.  Psychiatric: Mood, memory and judgment normal.  Vitals reviewed.  LABORATORY DATA: Lab Results  Component Value Date   WBC 15.3 (H) 01/02/2021   HGB 8.8 (L) 01/02/2021   HCT 25.2 (L) 01/02/2021   MCV 91.3 01/02/2021   PLT 63 (L) 01/02/2021      Chemistry      Component Value Date/Time   NA 140 01/02/2021 0832   NA 137 11/04/2020 0840   K 3.6 01/02/2021 0832   CL 104 01/02/2021 0832   CO2 26 01/02/2021 0832   BUN 16 01/02/2021 0832   BUN 15 11/04/2020 0840   CREATININE 1.25 (H) 01/02/2021 0832   CREATININE 0.97 10/15/2019 0907      Component Value Date/Time   CALCIUM 8.7 (L) 01/02/2021 0832   ALKPHOS 118 01/02/2021 0832   AST 33 01/02/2021 0832   ALT 67 (H) 01/02/2021 0832   BILITOT 1.0 01/02/2021 0832       RADIOGRAPHIC STUDIES:  DG Chest 2 View  Result Date: 12/24/2020 CLINICAL DATA:  Shortness of breath, difficulty breathing EXAM: CHEST - 2 VIEW COMPARISON:  Chest radiograph 12/15/2020 FINDINGS: The cardiomediastinal silhouette is stable. Increased interstitial markings are seen throughout both lungs likely reflecting chronic changes on a background of emphysema as seen on prior CT chest. Patchy opacities in the left base are unchanged. There is no new or worsening focal airspace disease. The known left upper lobe nodule is not well assessed on the current study. There is no pleural effusion or pneumothorax. There is no acute osseous abnormality. IMPRESSION: Patchy opacities in the left base, not significantly changed and again favored to reflect atelectasis. No new or worsening airspace disease. Electronically Signed   By: Valetta Mole M.D.   On: 12/24/2020 11:49   DG Chest 2 View  Result Date: 12/15/2020 CLINICAL DATA:  Cough and congestion EXAM: CHEST - 2 VIEW COMPARISON:  Chest x-ray dated  October 28, 2020 FINDINGS: Cardiac and mediastinal contours within normal limits. Unchanged left upper lobe mass. New mild bibasilar opacities. No pleural effusion or pneumothorax. Inferior right costophrenic angle is excluded from the field of view. IMPRESSION: 1. New mild bibasilar opacities differential includes atelectasis, aspiration or infection. 2. Left upper lobe mass, similar to prior radiograph. Electronically Signed   By: Yetta Glassman M.D.   On: 12/15/2020 15:50   DG Ribs Unilateral W/Chest Left  Result Date: 12/26/2020 CLINICAL DATA:  Fall, left anterior rib pain EXAM: LEFT RIBS AND CHEST - 3+ VIEW COMPARISON:  Chest radiographs and CT chest, 12/24/2020 FINDINGS: No displaced fracture or other bone lesions are seen involving the ribs. There is no evidence of pneumothorax or pleural effusion. Heterogeneous airspace opacity of the left lung base. Unchanged elevation of the left hemidiaphragm.  Heart size and mediastinal contours are within normal limits. IMPRESSION: 1. No displaced rib fracture. 2. Heterogeneous airspace opacity of the left lung base, consistent with infection or aspiration, as seen on prior. Electronically Signed   By: Delanna Ahmadi M.D.   On: 12/26/2020 12:14   CT Head Wo Contrast  Result Date: 12/26/2020 CLINICAL DATA:  Neck trauma. Head trauma, minor. Additional history provided: Unwitnessed syncopal episode and fall. Patient reports left rib pain, difficulty taking deep breaths. EXAM: CT HEAD WITHOUT CONTRAST CT CERVICAL SPINE WITHOUT CONTRAST TECHNIQUE: Multidetector CT imaging of the head and cervical spine was performed following the standard protocol without intravenous contrast. Multiplanar CT image reconstructions of the cervical spine were also generated. COMPARISON:  Brain MRI 10/09/2020. FINDINGS: CT HEAD FINDINGS Brain: Mild-to-moderate generalized cerebral atrophy. Comparatively mild cerebellar atrophy. Known chronic small-vessel infarcts within the right corona  radiata, within the basal ganglia and within the thalami, some of which were better appreciated on the prior brain MRI of 10/09/2020. Background moderate/advanced patchy and ill-defined hypoattenuation within the cerebral white matter, nonspecific but compatible with chronic small vessel ischemic disease. There is no acute intracranial hemorrhage. No demarcated cortical infarct. No extra-axial fluid collection. No evidence of an intracranial mass. No midline shift. Vascular: No hyperdense vessel.  Atherosclerotic calcifications. Skull: Normal. Negative for fracture or focal lesion. Sinuses/Orbits: Visualized orbits show no acute finding. Mild mucosal thickening within the bilateral ethmoid and maxillary sinuses at the imaged levels. CT CERVICAL SPINE FINDINGS Alignment: Straightening of the expected cervical lordosis. No significant spondylolisthesis. Skull base and vertebrae: The basion-dental and atlanto-dental intervals are maintained.No evidence of acute fracture to the cervical spine. Soft tissues and spinal canal: No prevertebral fluid or swelling. No visible canal hematoma. Disc levels: Cervical spondylosis with multilevel disc space narrowing, disc bulges, posterior disc osteophytes, endplate spurring, uncovertebral hypertrophy and facet arthrosis. Disc space narrowing is greatest at C5-C6, C6-C7 and T2-T3 (moderate at these levels). No appreciable levels of severe spinal canal stenosis. Multilevel bony neural foraminal narrowing. Multilevel ventral osteophytes. Upper chest: No consolidation within the imaged lung apices. No visible pneumothorax. Centrilobular and paraseptal emphysema with biapical bulla. Biapical pleuroparenchymal scarring. IMPRESSION: CT head: 1. No evidence of acute intracranial abnormality. 2. 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 3. Mild paranasal sinus disease at the  imaged levels. CT cervical spine: 1. No evidence of acute fracture to the cervical spine. 2. Straightening of the expected cervical lordosis. 3. Cervical spondylosis, as described. 4. Emphysema (ICD10-J43.9). Associated biapical pleuroparenchymal scarring and biapical bulla. Electronically Signed   By: Kellie Simmering D.O.   On: 12/26/2020 13:33   CT Chest W Contrast  Addendum Date: 12/27/2020   ADDENDUM REPORT: 12/27/2020 10:07 ADDENDUM: Upon reassessing imaging there is considerable artifact passing through the RIGHT lower chest and this study is venous phase. Despite images that show clear intraluminal low attenuation and high suspicion for pulmonary embolism there is question due to the artifact seen in this area. Furthermore, to help determine best future management in this complex patient would suggest a study performed in the appropriate phase, CT PE protocol with the arms up if possible. These results were called by telephone at the time of interpretation on 12/27/2020 at 10:07 am to provider Hosie Poisson MD, who verbally acknowledged these results. Electronically Signed   By: Zetta Bills M.D.   On: 12/27/2020 10:07   Result Date: 12/27/2020 CLINICAL DATA:  History of trauma, unwitnessed syncopal episode in an 80 year old male with history of pulmonary neoplasm. EXAM: CT CHEST WITH CONTRAST TECHNIQUE: Multidetector CT imaging of the chest was performed during intravenous contrast administration. CONTRAST:  53mL OMNIPAQUE IOHEXOL 350 MG/ML SOLN COMPARISON:  December 24, 2020 chest CT. FINDINGS: Cardiovascular: The aorta is normal caliber. Central pulmonary vasculature is normal caliber. Filling defects in the RIGHT lower lobe segmental and subsegmental branches to posterior basal segment (image 126/3) this is in an area of beam hardening artifact. The study is a venous phase evaluation not performed for PE assessment. No additional areas are demonstrated. Heart size is normal without pericardial  effusion. There is straightening of the interventricular septum that was present previously. Mediastinum/Nodes: Interval decrease in size of proximally 2 cm LEFT paramediastinal mass contiguous with AP window soft tissue is stable in the short interval. Small lymph nodes throughout the remainder of the mediastinum likewise are unchanged. Lungs/Pleura: LEFT lower lobe nodularity and airspace process showing no change in the short interval. Signs of pulmonary emphysema and LEFT upper lobe nodule as discussed. Nodular changes have developed in the RIGHT lower lobe, multifocal nodularity since the previous study. Stable background subsolid nodule (image 119/6) 13 x 15 mm, this was not present in August of 2022 nor in September and is therefore favored to represent post inflammatory process. There is mild septal thickening in the RIGHT upper lobe that is new from recent imaging. Mild bronchial wall thickening is present throughout the chest. Upper Abdomen: Incidental imaging of upper abdominal contents without acute process. 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 No acute upper abdominal process. Musculoskeletal: Signs of subacute sternal fracture, sclerotic margins without change, no substantial displacement. Spinal degenerative changes. Costochondral elements are intact. No signs of displaced rib fracture. Visualized clavicles and scapulae are intact. IMPRESSION: Small RIGHT lower lobe pulmonary emboli. Straightening of the interventricular septum is a stable finding and may relate to baseline elevated RIGHT heart pressures. Echocardiographic correlation may be helpful as warranted. Worsening of multifocal pneumonia. Signs of primary lung cancer in the LEFT chest with mediastinal involvement as described previously. 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 Sternal fracture favored to be subacute  is new since September of 2022. Not substantially changed since December 24, 2020. Aortic Atherosclerosis (ICD10-I70.0) and Emphysema (ICD10-J43.9). Critical Value/emergent results were called by telephone at the time of interpretation on 12/26/2020 at 1:42 pm to provider [facility] , who verbally acknowledged these results. Electronically Signed: By: Zetta Bills M.D. On: 12/26/2020 13:43   CT Angio Chest Pulmonary Embolism (PE) W or WO Contrast  Result Date: 12/27/2020 CLINICAL DATA:  PE suspected, high probability. Possible PE versus artifact identified incidentally on a CT scan of the chest with contrast from yesterday. EXAM: CT ANGIOGRAPHY CHEST WITH CONTRAST TECHNIQUE: Multidetector CT imaging of the chest was performed using the standard protocol during bolus administration of intravenous contrast. Multiplanar CT image reconstructions and MIPs were obtained to evaluate the vascular anatomy. CONTRAST:  33mL OMNIPAQUE IOHEXOL 350 MG/ML SOLN COMPARISON:  CT scan of the chest 12/26/2020 FINDINGS: Cardiovascular: Satisfactory opacification of the pulmonary arteries to the segmental level. No evidence of pulmonary embolism. Normal heart size. The previously questioned pulmonary emboli are not evident on today's exam and almost certainly represented focal beam hardening artifact. No pericardial effusion. Aortic and coronary artery atherosclerotic calcifications. Mediastinum/Nodes: Stable left paramediastinal mass measuring approximately 2 cm  in contiguous with soft tissue in the AP window. Additionally, small scattered mediastinal lymph nodes are also unchanged compared to recent prior imaging. Lungs/Pleura: Continued stability of nonspecific left lower lobe nodularity and left upper lobe pulmonary nodule. Stable appearance of multifocal airspace opacifications in the right lower lobe. Background of moderately severe centrilobular pulmonary emphysema. Slightly increased right lower lobe atelectasis. No  pleural effusion or pneumothorax. Overall, unchanged compared to yesterday. Upper Abdomen: No acute abnormality. Musculoskeletal: No acute abnormality. Review of the MIP images confirms the above findings. IMPRESSION: 1. Dedicated CTA imaging confirms that the previously suspected right lower lobe segmental pulmonary emboli were in fact artifactual. 2. Otherwise, unchanged appearance of multifocal pneumonia and primary left chest lung cancer with mediastinal involvement. Aortic Atherosclerosis (ICD10-I70.0) and Emphysema (ICD10-J43.9). Electronically Signed   By: Jacqulynn Cadet M.D.   On: 12/27/2020 13:38   CT Angio Chest PE W and/or Wo Contrast  Result Date: 12/24/2020 CLINICAL DATA:  Difficulty breathing.  History of lung cancer. EXAM: CT ANGIOGRAPHY CHEST WITH CONTRAST TECHNIQUE: Multidetector CT imaging of the chest was performed using the standard protocol during bolus administration of intravenous contrast. Multiplanar CT image reconstructions and MIPs were obtained to evaluate the vascular anatomy. CONTRAST:  58mL OMNIPAQUE IOHEXOL 350 MG/ML SOLN COMPARISON:  October 15, 2020.  October 15, 2013. FINDINGS: Cardiovascular: Satisfactory opacification of the pulmonary arteries to the segmental level. No evidence of pulmonary embolism. Normal heart size. No pericardial effusion. Mild coronary artery calcifications are noted. Mediastinum/Nodes: Thyroid gland and esophagus are unremarkable. 4.7 x 2.2 cm adenopathy is noted in aortopulmonary window. Lungs/Pleura: Emphysematous disease is noted. No pneumothorax or pleural effusion is noted. Patchy airspace opacities are noted in the left lower lobe concerning for pneumonia. 2.2 x 2.0 cm mass is noted anteriorly in the left upper lobe which does not appear to be significantly changed compared to prior exam and is concerning for malignancy. Several small ill-defined opacities are noted in the right lower lobe which may represent focal inflammation. Upper  Abdomen: No acute abnormality. Musculoskeletal: No chest wall abnormality. No acute or significant osseous findings. Review of the MIP images confirms the above findings. IMPRESSION: No definite evidence of pulmonary embolus. New patchy airspace opacities are noted in the left lower lobe and to a lesser degree in the right lower lobe, concerning for multifocal pneumonia. 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 Aortic Atherosclerosis (ICD10-I70.0) and Emphysema (ICD10-J43.9). Electronically Signed   By: Marijo Conception M.D.   On: 12/24/2020 16:50   CT Cervical Spine Wo Contrast  Result Date: 12/26/2020 CLINICAL DATA:  Neck trauma. Head trauma, minor. Additional history provided: Unwitnessed syncopal episode and fall. Patient reports left rib pain, difficulty taking deep breaths. EXAM: CT HEAD WITHOUT CONTRAST CT CERVICAL SPINE WITHOUT CONTRAST TECHNIQUE: Multidetector CT imaging of the head and cervical spine was performed following the standard protocol without intravenous contrast. Multiplanar CT image reconstructions of the cervical spine were also generated. COMPARISON:  Brain MRI 10/09/2020. FINDINGS: CT HEAD FINDINGS Brain: Mild-to-moderate generalized cerebral atrophy. Comparatively mild cerebellar atrophy. 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 Background moderate/advanced patchy and ill-defined hypoattenuation within the cerebral white matter, nonspecific but compatible with chronic small vessel ischemic disease. There is no acute intracranial hemorrhage.  No demarcated cortical infarct. No extra-axial fluid collection. No evidence of an intracranial mass. No midline shift. Vascular: No hyperdense vessel.  Atherosclerotic  calcifications. Skull: Normal. Negative for fracture or focal lesion. Sinuses/Orbits: Visualized orbits show no acute finding. Mild mucosal thickening within the bilateral ethmoid and maxillary sinuses at the imaged levels. CT CERVICAL SPINE FINDINGS Alignment: Straightening of the expected cervical lordosis. No significant spondylolisthesis. Skull base and vertebrae: The basion-dental and atlanto-dental intervals are maintained.No evidence of acute fracture to the cervical spine. Soft tissues and spinal canal: No prevertebral fluid or swelling. No visible canal hematoma. Disc levels: Cervical spondylosis with multilevel disc space narrowing, disc bulges, posterior disc osteophytes, endplate spurring, uncovertebral hypertrophy and facet arthrosis. Disc space narrowing is greatest at C5-C6, C6-C7 and T2-T3 (moderate at these levels). No appreciable levels of severe spinal canal stenosis. Multilevel bony neural foraminal narrowing. Multilevel ventral osteophytes. Upper chest: No consolidation within the imaged lung apices. No visible pneumothorax. Centrilobular and paraseptal emphysema with biapical bulla. Biapical pleuroparenchymal scarring. IMPRESSION: CT head: 1. No evidence of acute intracranial abnormality. 2. 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 3. Mild paranasal sinus disease at the imaged levels. CT cervical spine: 1. No evidence of acute fracture to the cervical spine. 2. Straightening of the expected cervical lordosis. 3. Cervical spondylosis, as described. 4. Emphysema (ICD10-J43.9). Associated biapical pleuroparenchymal scarring and biapical bulla. Electronically Signed   By: Kellie Simmering D.O.   On: 12/26/2020 13:33   ECHOCARDIOGRAM COMPLETE  Result Date: 12/27/2020    ECHOCARDIOGRAM REPORT   Patient Name:   SHADEE MONTOYA Date of Exam: 12/27/2020 Medical Rec #:  [id number]      Height:        70.0 in Accession #:    [id number]     Weight:       173.5 lb Date of Birth:  [birth year]      BSA:          1.965 m Patient Age:    18 years       BP:           106/73 mmHg Patient Gender: M              HR:           81 bpm. Exam Location:  Inpatient Procedure: 2D Echo, Cardiac Doppler and Color Doppler Indications:    Pulmonary Embolus I26.09  History:        Patient has no prior history of Echocardiogram examinations.                 COPD; Risk Factors:Hypertension.  Sonographer:    Bernadene Person RDCS Referring Phys: [id number] [place]  1. Left ventricular ejection fraction, by estimation, is 40 to 45%. The left ventricle has mildly decreased function. The left ventricle demonstrates global hypokinesis. The left ventricular internal cavity size was mildly dilated. Left ventricular diastolic function could not be evaluated.  2. Right ventricular systolic function is normal. The right ventricular size is normal. There is normal pulmonary artery systolic pressure.  3. Left atrial size was mildly dilated.  4. Right atrial size was mildly dilated.  5. The mitral valve is normal in structure. Trivial mitral valve regurgitation.  6. The aortic valve is calcified. There is mild calcification of the aortic valve. There is mild thickening of the aortic valve. Aortic valve regurgitation is mild to moderate.  7. Aortic dilatation noted. There is borderline dilatation of the ascending aorta, measuring 39 mm. FINDINGS  Left Ventricle: Left ventricular ejection fraction, by estimation, is 40 to 45%. The left ventricle has mildly decreased function. The left ventricle demonstrates global hypokinesis. The left ventricular internal cavity size was mildly dilated. There is  no left ventricular hypertrophy. Left ventricular diastolic function could not be evaluated due to atrial fibrillation. Left ventricular diastolic function could not be evaluated. Right Ventricle: The right ventricular size is normal.  Right vetricular wall thickness was not well visualized. Right ventricular systolic function is normal. There is normal pulmonary artery systolic pressure. The tricuspid regurgitant velocity is 2.18 m/s, and with an assumed right atrial pressure of 3 mmHg, the estimated right ventricular systolic pressure is 82.5 mmHg. Left Atrium: Left atrial size was mildly dilated. Right Atrium: Right atrial size was mildly dilated. Pericardium: There is no evidence of pericardial effusion. Mitral Valve: The mitral valve is normal in structure. Trivial mitral valve regurgitation. Tricuspid Valve: The tricuspid valve is grossly normal. Tricuspid valve regurgitation is trivial. Aortic Valve: The aortic valve is calcified. There is mild calcification of the aortic valve. There is mild thickening of the aortic valve. There is mild aortic valve annular calcification. Aortic valve regurgitation is mild to moderate. Aortic regurgitation PHT measures 326 msec. Pulmonic Valve: The pulmonic valve was not well visualized. Pulmonic valve regurgitation is not visualized. Aorta: Aortic dilatation noted. There is borderline dilatation of the ascending aorta, measuring 39 mm. IAS/Shunts: The atrial septum is grossly normal.  LEFT VENTRICLE PLAX 2D LVIDd:         5.00 cm LVIDs:         4.20 cm LV PW:         1.00 cm LV IVS:        1.00 cm LVOT diam:     2.20 cm LV SV:         69 LV SV Index:   35 LVOT Area:     3.80 cm  LV Volumes (MOD) LV vol d, MOD A2C: 126.0 ml LV vol d, MOD A4C: 96.6 ml LV vol s, MOD A2C: 70.9 ml LV vol s, MOD A4C: 58.1 ml LV SV MOD A2C:     55.1 ml LV SV MOD A4C:     96.6 ml LV SV MOD BP:      49.7 ml RIGHT VENTRICLE TAPSE (M-mode): 1.7 cm LEFT ATRIUM             Index        RIGHT ATRIUM           Index LA diam:        4.60 cm 2.34 cm/m   RA Area:     19.10 cm LA Vol (A2C):   62.9 ml 32.01 ml/m  RA Volume:   56.70 ml  28.86 ml/m LA Vol (A4C):   57.7 ml 29.36 ml/m LA Biplane Vol: 66.3 ml 33.74 ml/m  AORTIC VALVE LVOT  Vmax:   99.30 cm/s LVOT Vmean:  66.200 cm/s LVOT VTI:    0.182 m AI PHT:      326 msec  AORTA Ao Root diam: 3.90 cm Ao Asc diam:  3.90 cm TRICUSPID VALVE TR Peak grad:   19.0 mmHg TR Vmax:        218.00 cm/s  SHUNTS Systemic VTI:  0.18 m Systemic Diam: 2.20 cm Mertie Moores MD Electronically signed by Mertie Moores MD Signature Date/Time: 12/27/2020/4:31:49 PM  Final    VAS [place] LOWER EXTREMITY VENOUS (DVT)  Result Date: 01/01/2021  Lower Venous DVT Study Patient Name:  QUENTIN SHOREY  Date of Exam:   01/01/2021 Medical Rec #: [id number]       Accession #:    [id number] Date of Birth: 1940/08/18       Patient Gender: M Patient Age:   80 years Exam Location:  [facility] Procedure:      VAS [place] LOWER EXTREMITY VENOUS (DVT) Referring Phys: ERIC [place] --------------------------------------------------------------------------------  Indications: Edema.  Risk Factors: Cancer. Comparison Study: No prior studies. Performing Technologist: Oliver Hum RVT  Examination Guidelines: A complete evaluation includes B-mode imaging, spectral Doppler, color Doppler, and power Doppler as needed of all accessible portions of each vessel. Bilateral testing is considered an integral part of a complete examination. Limited examinations for reoccurring indications may be performed as noted. The reflux portion of the exam is performed with the patient in reverse Trendelenburg.  +---------+---------------+---------+-----------+----------+--------------+ RIGHT    CompressibilityPhasicitySpontaneityPropertiesThrombus Aging +---------+---------------+---------+-----------+----------+--------------+ CFV      Full           Yes      Yes                                 +---------+---------------+---------+-----------+----------+--------------+ SFJ      Full                                                        +---------+---------------+---------+-----------+----------+--------------+ FV Prox  Full                                                         +---------+---------------+---------+-----------+----------+--------------+ FV Mid   Full                                                        +---------+---------------+---------+-----------+----------+--------------+ FV DistalFull                                                        +---------+---------------+---------+-----------+----------+--------------+ PFV      Full                                                        +---------+---------------+---------+-----------+----------+--------------+ POP      Full           Yes      Yes                                 +---------+---------------+---------+-----------+----------+--------------+ PTV  Full                                                        +---------+---------------+---------+-----------+----------+--------------+ PERO     Full                                                        +---------+---------------+---------+-----------+----------+--------------+   +---------+---------------+---------+-----------+----------+--------------+ LEFT     CompressibilityPhasicitySpontaneityPropertiesThrombus Aging +---------+---------------+---------+-----------+----------+--------------+ CFV      Full           Yes      Yes                                 +---------+---------------+---------+-----------+----------+--------------+ SFJ      Full                                                        +---------+---------------+---------+-----------+----------+--------------+ FV Prox  Full                                                        +---------+---------------+---------+-----------+----------+--------------+ FV Mid   Full                                                        +---------+---------------+---------+-----------+----------+--------------+ FV DistalFull                                                         +---------+---------------+---------+-----------+----------+--------------+ PFV      Full                                                        +---------+---------------+---------+-----------+----------+--------------+ POP      Full           Yes      Yes                                 +---------+---------------+---------+-----------+----------+--------------+ PTV      Full                                                        +---------+---------------+---------+-----------+----------+--------------+  PERO     Full                                                        +---------+---------------+---------+-----------+----------+--------------+     Summary: RIGHT: - There is no evidence of deep vein thrombosis in the lower extremity.  - No cystic structure found in the popliteal fossa.  LEFT: - There is no evidence of deep vein thrombosis in the lower extremity.  - No cystic structure found in the popliteal fossa.  *See table(s) above for measurements and observations. Electronically signed by Orlie Pollen on 01/01/2021 at 5:17:49 PM.    Final      ASSESSMENT/PLAN:  This is a very pleasant 80 year old Caucasian male diagnosed with stage IV (T2a, N3, M1 B) non-small cell lung cancer, adenocarcinoma.  The patient presented with a large left upper lobe lung mass in addition to bilateral mediastinal lymphadenopathy.  The patient also has a right upper lobe metastatic nodule and a left adrenal metastasis.  The patient was diagnosed in October 2022.  The patient does not have any actionable mutations.  His PD-L1 expression is 5%.  The patient is currently undergoing palliative systemic chemotherapy with carboplatin for an AUC of 5, Alimta 500 mg per metered squared, Keytruda 200 mg IV every 3 weeks.  The patient is status post 2 cycles.  The patient had some pancytopenia following treatment.    He was recently hospitalized for pneumonia, syncope, A. fib with RVR, and pancytopenia.   The patient was discharged yesterday.  The patient states that he is feeling "pretty good today" and he slept better last night.  The patient is here today for close monitoring of his blood counts.  The patient's white blood cell count has improved to 15.3 likely from his 4 days of short acting G-CSF injections.  The patient's hemoglobin is 8.8.  No need for blood transfusion unless his hemoglobin is less than 8.  We will continue to monitor the patient's labs closely every week.  I will arrange for the patient to have a sample blood bank drawn every week in case he needs transfusion support.  His platelet count is 63k.  Discussed that he may start his Eliquis at this time.  We will see him for follow-up visit next week before starting cycle #3.  Discussed with the patient that Dr. Julien Nordmann will likely review dose reductions for his chemotherapy with him at his next appointment.  I strongly encouraged the patient to wait in the clinic 10 to 15 minutes after he has his weekly labs drawn in case he needs any type of supportive therapy.  Advised him to notify staff that he is here waiting for his lab results.  The patient's blood pressure is low today at 92/56.  He looks slightly dehydrated based on his slight bump in his creatinine today.  We will arrange for the patient to receive 500cc to 1 L of fluid today.  The patient was encouraged to hydrate at home as well.   Advised him to avoid forceful blowing of his nose as the mucosa is fragile and that can exacerbate and cause bleeding.   The patient was advised to call immediately if he has any concerning symptoms in the interval. The patient voices understanding of current disease status and treatment options and is in  agreement with the current care plan. All questions were answered. The patient knows to call the clinic with any problems, questions or concerns. We can certainly see the patient much sooner if necessary       Orders Placed This  Encounter  Procedures   Sample to Blood Bank    Standing Status:   Standing    Number of Occurrences:   4    Standing Expiration Date:   01/02/2022     The total time spent in the appointment was 20-29 minutes.   Shaquilla Kehres L Paylin Hailu, PA-C 01/02/21

## 2021-01-01 NOTE — Discharge Summary (Signed)
Physician Discharge Summary  Joe Hill KXF:[id number] DOB: Mar 31, 1940 DOA: 12/26/2020  PCP: [name], MD  Admit date: 12/26/2020 Discharge date: 01/01/2021  Admitted From: Home Disposition: Home  Recommendations for Outpatient Follow-up:  Follow up with PCP in 1-2 weeks Follow-up with medical oncology for repeat labs scheduled on 01/02/2021 at 8:30 AM Discontinued Eliquis due to thrombocytopenia with platelet count 32 at time of discharge  Home Health: No; outpatient physical therapy Equipment/Devices: None  Discharge Condition: Stable CODE STATUS: Full code Diet recommendation: Heart healthy diet  History of present illness:  Joe FERRENTINO is an 80 year old male with past medical history significant for stage IV NSCLC, bilateral adrenal, mets, adenocarcinoma, COPD, GERD, HTN, paroxysmal atrial fibrillation, tobacco use disorder, prostate cancer, who presented to [facility] ED via EMS on 12/2 following unwitnessed syncopal episode and collapse at home.  Patient does not recall any prodromal symptoms prior to passing out and just remembers being on the floor.   In the ED, temperature 97.4 F, HR 104, RR 18, BP 102/67, SPO2 100% on room air.  Urinalysis with rare bacteria, otherwise unrevealing.  WBC 2.6, hemoglobin 10.5, platelets 119.  Potassium 3.3, glucose 29, BUN 54, creatinine 1.14.  CT head without contrast with no acute intracranial abnormality. CT C-spine with no acute fracture/subluxation.  CTA chest with small right lower lobe pulmonary emboli, worsening of multifocal pneumonia and signs of primary lung cancer in the left chest with mediastinal involvement.  Patient received 2 L NS bolus, Dilaudid 1 mg IV PE, morphine 4 mg IVP, Zofran IVP, ceftriaxone and azithromycin.  Patient was started on a heparin drip.  TRH consulted for further evaluation and management of syncope, A. fib with RVR, and multifocal pneumonia.  Hospital course:  Pancytopenia Etiology likely secondary to  chemotherapy.  Oncology was consulted and followed during hospital course.  Patient was transfused 1 unit PRBC and 1 unit platelets on 12/30/2020.  Patient was started on Granix with improvement of WBC count to 2.8 at time of discharge.  Neutropenia has resolved.  Patient continues with thrombocytopenia with a platelet count of 32 which is slowly improving.  We will continue off of Eliquis on discharge until follows up with oncology tomorrow for repeat labs.  Syncope Patient presenting from home with unwitnessed syncopal episode of unclear etiology.  Suspect likely related to respiratory failure and multifocal pneumonia.  CT head without contrast with no evidence of acute intracranial abnormality.  Repeat CT angiogram chest negative for pulmonary embolism.  TTE with LVEF 40-45%, LV mildly decreased function with global hypokinesis, LV mildly dilated, LA/RA mildly dilated, mild/moderate AR, aortic dilation 39 mm. --Continue to monitor for arrhythmia on telemetry, none noted/far during hospitalization   Paroxysmal atrial fibrillation with RVR Cardizem CD 240 mg p.o. daily.  Eliquis discontinued due to thrombocytopenia   Acute hypoxic respiratory failure, POA: Resolved Multifocal pneumonia Patient requiring submental oxygen on arrival.  Not oxygen dependent outpatient.  CT chest with contrast on admission with mild bronchial wall thickening, multifocal airspace opacifications right lower lobe.  Patient completed 5-day course of azithromycin and ceftriaxone.  Oxygen has been weaned off.   Pulmonary embolism; ruled out Initial CT a chest notable for small right lower lobe pulmonary emboli, repeat CTA study negative for PE.  Bilateral lower extremity duplex ultrasound negative for DVT.   Stage IV ([id number]) non-small cell lung cancer, adenocarcinoma with bilateral mediastinal adenopathy, adrenal metastases Follows with medical oncology outpatient, Dr. [name].  Currently on systemic chemotherapy with  carboplatin for  AUC of 5, Alimta 500 Mg/M2 and Keytruda 200 Mg IV every 3 weeks, status post 2 cycles, last cycle on 12/17/20.  Outpatient follow-up with oncology.   GERD: Protonix 40 mg p.o. daily  Discharge Diagnoses:  Principal Problem:   Syncope and collapse Active Problems:   Essential hypertension   GERD   Hyperglycemia   Dyslipidemia   Aortic atherosclerosis (HCC)   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HCC)   Pancytopenia (HCC)   Hypokalemia   Borderline prolonged QT interval   Multifocal pneumonia   Pulmonary embolism (HCC)   Atrial fibrillation with RVR (HCC)   Chemotherapy-induced neutropenia [employer] [employer])    Discharge Instructions  Discharge Instructions     Call MD for:  difficulty breathing, headache or visual disturbances   Complete by: As directed    Call MD for:  extreme fatigue   Complete by: As directed    Call MD for:  persistant dizziness or light-headedness   Complete by: As directed    Call MD for:  persistant nausea and vomiting   Complete by: As directed    Call MD for:  redness, tenderness, or signs of infection (pain, swelling, redness, odor or green/yellow discharge around incision site)   Complete by: As directed    Call MD for:  severe uncontrolled pain   Complete by: As directed    Call MD for:  temperature >100.4   Complete by: As directed    Diet - low sodium heart healthy   Complete by: As directed    Increase activity slowly   Complete by: As directed       Allergies as of 01/01/2021       Reactions   Penicillins Hives        Medication List     STOP taking these medications    apixaban 5 MG Tabs tablet Commonly known as: ELIQUIS   azithromycin 250 MG tablet Commonly known as: ZITHROMAX       TAKE these medications    Advil 200 MG Caps Generic drug: Ibuprofen Take 200 mg by mouth every 6 (six) hours as needed (for mild pain or headaches).   albuterol 108 (90 Base) MCG/ACT inhaler Commonly known as: VENTOLIN HFA Inhale 2  puffs into the lungs every 6 (six) hours as needed for wheezing (or to aid in the removal of phlegm).   dextromethorphan-guaiFENesin 30-600 MG 12hr tablet Commonly known as: MUCINEX DM Take 1 tablet by mouth 2 (two) times daily as needed for cough.   diltiazem 240 MG 24 hr capsule Commonly known as: Cardizem CD Take 1 capsule (240 mg total) by mouth daily. Start taking on: January 02, 2021 What changed:  medication strength Another medication with the same name was removed. Continue taking this medication, and follow the directions you see here.   ENSURE COMPLETE PO Take 237 mLs by mouth See admin instructions. Drink 237 ml's by mouth one to two times a day   feeding supplement Liqd Take 1 Container by mouth 2 (two) times daily.   folic acid 1 MG tablet Commonly known as: FOLVITE Take 1 tablet (1 mg total) by mouth daily.   pantoprazole 40 MG tablet Commonly known as: PROTONIX Take 1 tablet (40 mg total) by mouth daily. Start taking on: January 02, 2021   potassium chloride SA 20 MEQ tablet Commonly known as: KLOR-CON M Take 1 tablet (20 mEq total) by mouth daily. Take 2 tablets (75meq) for 2 days then take 1 tablet (28meq) daily What changed: additional  instructions   prochlorperazine 10 MG tablet Commonly known as: COMPAZINE Take 1 tablet (10 mg total) by mouth every 6 (six) hours as needed for nausea or vomiting.        Follow-up Information     [facility] Follow up.   Specialty: Rehabilitation Why: They will call you to set up appt after discharge. Contact information: [address] [id number] [place]        [name], MD. Schedule an appointment as soon as possible for a visit in 1 week(s).   Specialty: Family Medicine Contact information: [place] [address] [phone]         Evans Lance, MD .   Specialty:  Cardiology Contact information: [phone] [address]. [address] [place] [address] [phone]         Heilingoetter, Cassandra L, PA-C. Go on 01/02/2021.   Specialty: Physician Assistant Why: 830am Contact information: [place] [address] [phone]                Allergies  Allergen Reactions   Penicillins Hives    Consultations: Medical oncology   Procedures/Studies: DG Chest 2 View  Result Date: 12/24/2020 CLINICAL DATA:  Shortness of breath, difficulty breathing EXAM: CHEST - 2 VIEW COMPARISON:  Chest radiograph 12/15/2020 FINDINGS: The cardiomediastinal silhouette is stable. Increased interstitial markings are seen throughout both lungs likely reflecting chronic changes on a background of emphysema as seen on prior CT chest. Patchy opacities in the left base are unchanged. There is no new or worsening focal airspace disease. The known left upper lobe nodule is not well assessed on the current study. There is no pleural effusion or pneumothorax. There is no acute osseous abnormality. IMPRESSION: Patchy opacities in the left base, not significantly changed and again favored to reflect atelectasis. No new or worsening airspace disease. Electronically Signed   By: Valetta Mole M.D.   On: 12/24/2020 11:49   DG Chest 2 View  Result Date: 12/15/2020 CLINICAL DATA:  Cough and congestion EXAM: CHEST - 2 VIEW COMPARISON:  Chest x-ray dated October 28, 2020 FINDINGS: Cardiac and mediastinal contours within normal limits. Unchanged left upper lobe mass. New mild bibasilar opacities. No pleural effusion or pneumothorax. Inferior right costophrenic angle is excluded from the field of view. IMPRESSION: 1. New mild bibasilar opacities differential includes atelectasis, aspiration or infection. 2. Left upper lobe mass, similar to prior radiograph. Electronically Signed   By: Yetta Glassman M.D.   On: 12/15/2020 15:50   DG Ribs Unilateral W/Chest  Left  Result Date: 12/26/2020 CLINICAL DATA:  Fall, left anterior rib pain EXAM: LEFT RIBS AND CHEST - 3+ VIEW COMPARISON:  Chest radiographs and CT chest, 12/24/2020 FINDINGS: No displaced fracture or other bone lesions are seen involving the ribs. There is no evidence of pneumothorax or pleural effusion. Heterogeneous airspace opacity of the left lung base. Unchanged elevation of the left hemidiaphragm. Heart size and mediastinal contours are within normal limits. IMPRESSION: 1. No displaced rib fracture. 2. Heterogeneous airspace opacity of the left lung base, consistent with infection or aspiration, as seen on prior. Electronically Signed   By: Delanna Ahmadi M.D.   On: 12/26/2020 12:14   CT Head Wo Contrast  Result Date: 12/26/2020 CLINICAL DATA:  Neck trauma. Head trauma, minor. Additional history provided: Unwitnessed syncopal episode and fall. Patient reports left rib pain, difficulty taking deep breaths. EXAM: CT HEAD WITHOUT CONTRAST CT  CERVICAL SPINE WITHOUT CONTRAST TECHNIQUE: Multidetector CT imaging of the head and cervical spine was performed following the standard protocol without intravenous contrast. Multiplanar CT image reconstructions of the cervical spine were also generated. COMPARISON:  Brain MRI 10/09/2020. FINDINGS: CT HEAD FINDINGS Brain: Mild-to-moderate generalized cerebral atrophy. Comparatively mild cerebellar atrophy. 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 Background moderate/advanced patchy and ill-defined hypoattenuation within the cerebral white matter, nonspecific but compatible with chronic small vessel ischemic disease. There is no acute intracranial hemorrhage. No demarcated cortical infarct. No extra-axial fluid collection. No evidence of an intracranial mass. No midline shift. Vascular: No hyperdense vessel.   Atherosclerotic calcifications. Skull: Normal. Negative for fracture or focal lesion. Sinuses/Orbits: Visualized orbits show no acute finding. Mild mucosal thickening within the bilateral ethmoid and maxillary sinuses at the imaged levels. CT CERVICAL SPINE FINDINGS Alignment: Straightening of the expected cervical lordosis. No significant spondylolisthesis. Skull base and vertebrae: The basion-dental and atlanto-dental intervals are maintained.No evidence of acute fracture to the cervical spine. Soft tissues and spinal canal: No prevertebral fluid or swelling. No visible canal hematoma. Disc levels: Cervical spondylosis with multilevel disc space narrowing, disc bulges, posterior disc osteophytes, endplate spurring, uncovertebral hypertrophy and facet arthrosis. Disc space narrowing is greatest at C5-C6, C6-C7 and T2-T3 (moderate at these levels). No appreciable levels of severe spinal canal stenosis. Multilevel bony neural foraminal narrowing. Multilevel ventral osteophytes. Upper chest: No consolidation within the imaged lung apices. No visible pneumothorax. Centrilobular and paraseptal emphysema with biapical bulla. Biapical pleuroparenchymal scarring. IMPRESSION: CT head: 1. No evidence of acute intracranial abnormality. 2. 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 3. Mild paranasal sinus disease at the imaged levels. CT cervical spine: 1. No evidence of acute fracture to the cervical spine. 2. Straightening of the expected cervical lordosis. 3. Cervical spondylosis, as described. 4. Emphysema (ICD10-J43.9). Associated biapical pleuroparenchymal scarring and biapical bulla. Electronically Signed   By: Kellie Simmering D.O.   On: 12/26/2020 13:33   CT Chest W Contrast  Addendum Date: 12/27/2020   ADDENDUM REPORT: 12/27/2020 10:07 ADDENDUM: Upon reassessing imaging there is considerable artifact  passing through the RIGHT lower chest and this study is venous phase. Despite images that show clear intraluminal low attenuation and high suspicion for pulmonary embolism there is question due to the artifact seen in this area. Furthermore, to help determine best future management in this complex patient would suggest a study performed in the appropriate phase, CT PE protocol with the arms up if possible. These results were called by telephone at the time of interpretation on 12/27/2020 at 10:07 am to provider Hosie Poisson MD, who verbally acknowledged these results. Electronically Signed   By: Zetta Bills M.D.   On: 12/27/2020 10:07   Result Date: 12/27/2020 CLINICAL DATA:  History of trauma, unwitnessed syncopal episode in an 80 year old male with history of pulmonary neoplasm. EXAM: CT CHEST WITH CONTRAST TECHNIQUE: Multidetector CT imaging of the chest was performed during intravenous contrast administration. CONTRAST:  38mL OMNIPAQUE IOHEXOL 350 MG/ML SOLN COMPARISON:  December 24, 2020 chest CT. FINDINGS: Cardiovascular: The aorta is normal caliber. Central pulmonary vasculature is normal caliber. Filling defects in the RIGHT lower lobe segmental and subsegmental branches to posterior basal segment (image 126/3) this is in an area of beam hardening artifact. The  study is a venous phase evaluation not performed for PE assessment. No additional areas are demonstrated. Heart size is normal without pericardial effusion. There is straightening of the interventricular septum that was present previously. Mediastinum/Nodes: Interval decrease in size of proximally 2 cm LEFT paramediastinal mass contiguous with AP window soft tissue is stable in the short interval. Small lymph nodes throughout the remainder of the mediastinum likewise are unchanged. Lungs/Pleura: LEFT lower lobe nodularity and airspace process showing no change in the short interval. Signs of pulmonary emphysema and LEFT upper lobe nodule as  discussed. Nodular changes have developed in the RIGHT lower lobe, multifocal nodularity since the previous study. Stable background subsolid nodule (image 119/6) 13 x 15 mm, this was not present in August of 2022 nor in September and is therefore favored to represent post inflammatory process. There is mild septal thickening in the RIGHT upper lobe that is new from recent imaging. Mild bronchial wall thickening is present throughout the chest. Upper Abdomen: Incidental imaging of upper abdominal contents without acute process. 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 No acute upper abdominal process. Musculoskeletal: Signs of subacute sternal fracture, sclerotic margins without change, no substantial displacement. Spinal degenerative changes. Costochondral elements are intact. No signs of displaced rib fracture. Visualized clavicles and scapulae are intact. IMPRESSION: Small RIGHT lower lobe pulmonary emboli. Straightening of the interventricular septum is a stable finding and may relate to baseline elevated RIGHT heart pressures. Echocardiographic correlation may be helpful as warranted. Worsening of multifocal pneumonia. Signs of primary lung cancer in the LEFT chest with mediastinal involvement as described previously. 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 Sternal fracture favored to be subacute is new since September of 2022. Not substantially changed since December 24, 2020. Aortic Atherosclerosis (ICD10-I70.0) and Emphysema (ICD10-J43.9). Critical Value/emergent results were called by telephone at the time of interpretation on 12/26/2020 at 1:42 pm to provider [facility] , who verbally acknowledged these results. Electronically Signed: By: Zetta Bills M.D. On: 12/26/2020 13:43   CT Angio Chest Pulmonary Embolism (PE) W or WO Contrast  Result Date: 12/27/2020 CLINICAL DATA:   PE suspected, high probability. Possible PE versus artifact identified incidentally on a CT scan of the chest with contrast from yesterday. EXAM: CT ANGIOGRAPHY CHEST WITH CONTRAST TECHNIQUE: Multidetector CT imaging of the chest was performed using the standard protocol during bolus administration of intravenous contrast. Multiplanar CT image reconstructions and MIPs were obtained to evaluate the vascular anatomy. CONTRAST:  37mL OMNIPAQUE IOHEXOL 350 MG/ML SOLN COMPARISON:  CT scan of the chest 12/26/2020 FINDINGS: Cardiovascular: Satisfactory opacification of the pulmonary arteries to the segmental level. No evidence of pulmonary embolism. Normal heart size. The previously questioned pulmonary emboli are not evident on today's exam and almost certainly represented focal beam hardening artifact. No pericardial effusion. Aortic and coronary artery atherosclerotic calcifications. Mediastinum/Nodes: Stable left paramediastinal mass measuring approximately 2 cm in contiguous with soft tissue in the AP window. Additionally, small scattered mediastinal lymph nodes are also unchanged compared to recent prior imaging. Lungs/Pleura: Continued stability of nonspecific left lower lobe nodularity and left upper lobe pulmonary nodule. Stable appearance of multifocal airspace opacifications in the right lower lobe. Background of moderately severe centrilobular pulmonary emphysema. Slightly increased right lower lobe atelectasis. No pleural effusion or pneumothorax. Overall, unchanged compared to yesterday. Upper Abdomen: No acute abnormality. Musculoskeletal: No acute abnormality. Review of the MIP images confirms the  above findings. IMPRESSION: 1. Dedicated CTA imaging confirms that the previously suspected right lower lobe segmental pulmonary emboli were in fact artifactual. 2. Otherwise, unchanged appearance of multifocal pneumonia and primary left chest lung cancer with mediastinal involvement. Aortic Atherosclerosis  (ICD10-I70.0) and Emphysema (ICD10-J43.9). Electronically Signed   By: Jacqulynn Cadet M.D.   On: 12/27/2020 13:38   CT Angio Chest PE W and/or Wo Contrast  Result Date: 12/24/2020 CLINICAL DATA:  Difficulty breathing.  History of lung cancer. EXAM: CT ANGIOGRAPHY CHEST WITH CONTRAST TECHNIQUE: Multidetector CT imaging of the chest was performed using the standard protocol during bolus administration of intravenous contrast. Multiplanar CT image reconstructions and MIPs were obtained to evaluate the vascular anatomy. CONTRAST:  52mL OMNIPAQUE IOHEXOL 350 MG/ML SOLN COMPARISON:  October 15, 2020.  October 15, 2013. FINDINGS: Cardiovascular: Satisfactory opacification of the pulmonary arteries to the segmental level. No evidence of pulmonary embolism. Normal heart size. No pericardial effusion. Mild coronary artery calcifications are noted. Mediastinum/Nodes: Thyroid gland and esophagus are unremarkable. 4.7 x 2.2 cm adenopathy is noted in aortopulmonary window. Lungs/Pleura: Emphysematous disease is noted. No pneumothorax or pleural effusion is noted. Patchy airspace opacities are noted in the left lower lobe concerning for pneumonia. 2.2 x 2.0 cm mass is noted anteriorly in the left upper lobe which does not appear to be significantly changed compared to prior exam and is concerning for malignancy. Several small ill-defined opacities are noted in the right lower lobe which may represent focal inflammation. Upper Abdomen: No acute abnormality. Musculoskeletal: No chest wall abnormality. No acute or significant osseous findings. Review of the MIP images confirms the above findings. IMPRESSION: No definite evidence of pulmonary embolus. New patchy airspace opacities are noted in the left lower lobe and to a lesser degree in the right lower lobe, concerning for multifocal pneumonia. 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 Also noted is probable metastatic  adenopathy in the aortopulmonary window. Aortic Atherosclerosis (ICD10-I70.0) and Emphysema (ICD10-J43.9). Electronically Signed   By: Marijo Conception M.D.   On: 12/24/2020 16:50   CT Cervical Spine Wo Contrast  Result Date: 12/26/2020 CLINICAL DATA:  Neck trauma. Head trauma, minor. Additional history provided: Unwitnessed syncopal episode and fall. Patient reports left rib pain, difficulty taking deep breaths. EXAM: CT HEAD WITHOUT CONTRAST CT CERVICAL SPINE WITHOUT CONTRAST TECHNIQUE: Multidetector CT imaging of the head and cervical spine was performed following the standard protocol without intravenous contrast. Multiplanar CT image reconstructions of the cervical spine were also generated. COMPARISON:  Brain MRI 10/09/2020. FINDINGS: CT HEAD FINDINGS Brain: Mild-to-moderate generalized cerebral atrophy. Comparatively mild cerebellar atrophy. 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 Background moderate/advanced patchy and ill-defined hypoattenuation within the cerebral white matter, nonspecific but compatible with chronic small vessel ischemic disease. There is no acute intracranial hemorrhage. No demarcated cortical infarct. No extra-axial fluid collection. No evidence of an intracranial mass. No midline shift. Vascular: No hyperdense vessel.  Atherosclerotic calcifications. Skull: Normal. Negative for fracture or focal lesion. Sinuses/Orbits: Visualized orbits show no acute finding. Mild mucosal thickening within the bilateral ethmoid and maxillary sinuses at the imaged levels. CT CERVICAL SPINE FINDINGS Alignment: Straightening of the expected cervical lordosis. No significant spondylolisthesis. Skull base and vertebrae: The basion-dental and atlanto-dental intervals are maintained.No evidence of acute fracture to the cervical spine. Soft tissues  and  spinal canal: No prevertebral fluid or swelling. No visible canal hematoma. Disc levels: Cervical spondylosis with multilevel disc space narrowing, disc bulges, posterior disc osteophytes, endplate spurring, uncovertebral hypertrophy and facet arthrosis. Disc space narrowing is greatest at C5-C6, C6-C7 and T2-T3 (moderate at these levels). No appreciable levels of severe spinal canal stenosis. Multilevel bony neural foraminal narrowing. Multilevel ventral osteophytes. Upper chest: No consolidation within the imaged lung apices. No visible pneumothorax. Centrilobular and paraseptal emphysema with biapical bulla. Biapical pleuroparenchymal scarring. IMPRESSION: CT head: 1. No evidence of acute intracranial abnormality. 2. 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 3. Mild paranasal sinus disease at the imaged levels. CT cervical spine: 1. No evidence of acute fracture to the cervical spine. 2. Straightening of the expected cervical lordosis. 3. Cervical spondylosis, as described. 4. Emphysema (ICD10-J43.9). Associated biapical pleuroparenchymal scarring and biapical bulla. Electronically Signed   By: Kellie Simmering D.O.   On: 12/26/2020 13:33   ECHOCARDIOGRAM COMPLETE  Result Date: 12/27/2020    ECHOCARDIOGRAM REPORT   Patient Name:   JESSTIN STUDSTILL Date of Exam: 12/27/2020 Medical Rec #:  [id number]      Height:       70.0 in Accession #:    [id number]     Weight:       173.5 lb Date of Birth:  1940-12-11      BSA:          1.965 m Patient Age:    4 years       BP:           106/73 mmHg Patient Gender: M              HR:           81 bpm. Exam Location:  Inpatient Procedure: 2D Echo, Cardiac Doppler and Color Doppler Indications:    Pulmonary Embolus I26.09  History:        Patient has no prior history of Echocardiogram examinations.                 COPD; Risk Factors:Hypertension.  Sonographer:    Bernadene Person RDCS Referring Phys: [id number] [place]  1. Left ventricular ejection fraction, by estimation, is 40 to 45%. The left ventricle has mildly decreased function. The left ventricle demonstrates global hypokinesis. The left ventricular internal cavity size was mildly dilated. Left ventricular diastolic function could not be evaluated.  2. Right ventricular systolic function is normal. The right ventricular size is normal. There is normal pulmonary artery systolic pressure.  3. Left atrial size was mildly dilated.  4. Right atrial size was mildly dilated.  5. The mitral valve is normal in structure. Trivial mitral valve regurgitation.  6. The aortic valve is calcified. There is mild calcification of the aortic valve. There is mild thickening of the aortic valve. Aortic valve regurgitation is mild to moderate.  7. Aortic dilatation noted. There is borderline dilatation of the ascending aorta, measuring 39 mm. FINDINGS  Left Ventricle: Left ventricular ejection fraction, by estimation, is 40 to 45%. The left ventricle has mildly decreased function. The left ventricle demonstrates global hypokinesis. The left ventricular internal cavity size was mildly dilated. There is  no left ventricular hypertrophy. Left ventricular diastolic function could not be evaluated due to atrial fibrillation. Left ventricular diastolic function could not be evaluated. Right Ventricle: The right ventricular size is normal. Right vetricular  wall thickness was not well visualized. Right ventricular systolic function is normal. There is normal pulmonary artery systolic pressure. The tricuspid regurgitant velocity is 2.18 m/s, and with an assumed right atrial pressure of 3 mmHg, the estimated right ventricular systolic pressure is 73.4 mmHg. Left Atrium: Left atrial size was mildly dilated. Right Atrium: Right atrial size was mildly dilated. Pericardium: There is no evidence of pericardial effusion. Mitral Valve: The  mitral valve is normal in structure. Trivial mitral valve regurgitation. Tricuspid Valve: The tricuspid valve is grossly normal. Tricuspid valve regurgitation is trivial. Aortic Valve: The aortic valve is calcified. There is mild calcification of the aortic valve. There is mild thickening of the aortic valve. There is mild aortic valve annular calcification. Aortic valve regurgitation is mild to moderate. Aortic regurgitation PHT measures 326 msec. Pulmonic Valve: The pulmonic valve was not well visualized. Pulmonic valve regurgitation is not visualized. Aorta: Aortic dilatation noted. There is borderline dilatation of the ascending aorta, measuring 39 mm. IAS/Shunts: The atrial septum is grossly normal.  LEFT VENTRICLE PLAX 2D LVIDd:         5.00 cm LVIDs:         4.20 cm LV PW:         1.00 cm LV IVS:        1.00 cm LVOT diam:     2.20 cm LV SV:         69 LV SV Index:   35 LVOT Area:     3.80 cm  LV Volumes (MOD) LV vol d, MOD A2C: 126.0 ml LV vol d, MOD A4C: 96.6 ml LV vol s, MOD A2C: 70.9 ml LV vol s, MOD A4C: 58.1 ml LV SV MOD A2C:     55.1 ml LV SV MOD A4C:     96.6 ml LV SV MOD BP:      49.7 ml RIGHT VENTRICLE TAPSE (M-mode): 1.7 cm LEFT ATRIUM             Index        RIGHT ATRIUM           Index LA diam:        4.60 cm 2.34 cm/m   RA Area:     19.10 cm LA Vol (A2C):   62.9 ml 32.01 ml/m  RA Volume:   56.70 ml  28.86 ml/m LA Vol (A4C):   57.7 ml 29.36 ml/m LA Biplane Vol: 66.3 ml 33.74 ml/m  AORTIC VALVE LVOT Vmax:   99.30 cm/s LVOT Vmean:  66.200 cm/s LVOT VTI:    0.182 m AI PHT:      326 msec  AORTA Ao Root diam: 3.90 cm Ao Asc diam:  3.90 cm TRICUSPID VALVE TR Peak grad:   19.0 mmHg TR Vmax:        218.00 cm/s  SHUNTS Systemic VTI:  0.18 m Systemic Diam: 2.20 cm Mertie Moores MD Electronically signed by Mertie Moores MD Signature Date/Time: 12/27/2020/4:31:49 PM    Final    VAS [place] LOWER EXTREMITY VENOUS (DVT)  Result Date: 01/01/2021  Lower Venous DVT Study Patient Name:  KONA LOVER  Date  of Exam:   01/01/2021 Medical Rec #: [id number]       Accession #:    [id number] Date of Birth: 10/05/1940       Patient Gender: M Patient Age:   59 years Exam Location:  [facility] Procedure:      VAS [place] LOWER EXTREMITY VENOUS (DVT) Referring Phys: Marquist Binstock  [place] --------------------------------------------------------------------------------  Indications: Edema.  Risk Factors: Cancer. Comparison Study: No prior studies. Performing Technologist: Oliver Hum RVT  Examination Guidelines: A complete evaluation includes B-mode imaging, spectral Doppler, color Doppler, and power Doppler as needed of all accessible portions of each vessel. Bilateral testing is considered an integral part of a complete examination. Limited examinations for reoccurring indications may be performed as noted. The reflux portion of the exam is performed with the patient in reverse Trendelenburg.  +---------+---------------+---------+-----------+----------+--------------+ RIGHT    CompressibilityPhasicitySpontaneityPropertiesThrombus Aging +---------+---------------+---------+-----------+----------+--------------+ CFV      Full           Yes      Yes                                 +---------+---------------+---------+-----------+----------+--------------+ SFJ      Full                                                        +---------+---------------+---------+-----------+----------+--------------+ FV Prox  Full                                                        +---------+---------------+---------+-----------+----------+--------------+ FV Mid   Full                                                        +---------+---------------+---------+-----------+----------+--------------+ FV DistalFull                                                        +---------+---------------+---------+-----------+----------+--------------+ PFV      Full                                                         +---------+---------------+---------+-----------+----------+--------------+ POP      Full           Yes      Yes                                 +---------+---------------+---------+-----------+----------+--------------+ PTV      Full                                                        +---------+---------------+---------+-----------+----------+--------------+ PERO     Full                                                        +---------+---------------+---------+-----------+----------+--------------+   +---------+---------------+---------+-----------+----------+--------------+  LEFT     CompressibilityPhasicitySpontaneityPropertiesThrombus Aging +---------+---------------+---------+-----------+----------+--------------+ CFV      Full           Yes      Yes                                 +---------+---------------+---------+-----------+----------+--------------+ SFJ      Full                                                        +---------+---------------+---------+-----------+----------+--------------+ FV Prox  Full                                                        +---------+---------------+---------+-----------+----------+--------------+ FV Mid   Full                                                        +---------+---------------+---------+-----------+----------+--------------+ FV DistalFull                                                        +---------+---------------+---------+-----------+----------+--------------+ PFV      Full                                                        +---------+---------------+---------+-----------+----------+--------------+ POP      Full           Yes      Yes                                 +---------+---------------+---------+-----------+----------+--------------+ PTV      Full                                                         +---------+---------------+---------+-----------+----------+--------------+ PERO     Full                                                        +---------+---------------+---------+-----------+----------+--------------+     Summary: RIGHT: - There is no evidence of deep vein thrombosis in the lower extremity.  - No cystic structure found in the popliteal fossa.  LEFT: - There is no evidence of deep vein thrombosis in the lower extremity.  - No  cystic structure found in the popliteal fossa.  *See table(s) above for measurements and observations.    Preliminary      Subjective: Patient seen examined bedside, resting comfortably.  Has had notable swelling during hospitalization with passer duplex ultrasound negative for DVT.  Neutropenia has resolved but continues with thrombocytopenia.  Patient wishes to discharge home today.  Discussed with medical oncology, okay for discharge and has appointment tomorrow for repeat labs.  Must remain off of Eliquis until thrombocytopenia resolves.  No other questions or concerns at this time.  Denies headache, no visual changes, no chest pain, no palpitations, no shortness of breath, no abdominal pain, no weakness, no fatigue, no paresthesias.  No acute events overnight per nursing.  Discharge Exam: Vitals:   01/01/21 0447 01/01/21 1241  BP: 102/77 (!) 105/53  Pulse: 97 96  Resp: 18 18  Temp: 98.2 F (36.8 C) 98.4 F (36.9 C)  SpO2: 99% 100%   Vitals:   12/31/20 2025 01/01/21 0153 01/01/21 0447 01/01/21 1241  BP: (!) 101/58 (!) 108/49 102/77 (!) 105/53  Pulse: 93 (!) 105 97 96  Resp: 18 16 18 18   Temp: 98.7 F (37.1 C) 98.8 F (37.1 C) 98.2 F (36.8 C) 98.4 F (36.9 C)  TempSrc: Oral Oral Oral Oral  SpO2: 99% 99% 99% 100%  Weight:      Height:        General: Pt is alert, awake, not in acute distress Cardiovascular: RRR, S1/S2 +, no rubs, no gallops Respiratory: CTA bilaterally, no wheezing, no rhonchi, on room air Abdominal: Soft, NT, ND,  bowel sounds + Extremities: Bilateral peripheral edema, no cyanosis    The results of significant diagnostics from this hospitalization (including imaging, microbiology, ancillary and laboratory) are listed below for reference.     Microbiology: Recent Results (from the past 240 hour(s))  Resp Panel by RT-PCR (Flu A&B, Covid) Nasopharyngeal Swab     Status: None   Collection Time: 12/24/20 12:03 PM   Specimen: Nasopharyngeal Swab; Nasopharyngeal(NP) swabs in vial transport medium  Result Value Ref Range Status   SARS Coronavirus 2 by RT PCR NEGATIVE NEGATIVE Final    Comment: (NOTE) SARS-CoV-2 target nucleic acids are NOT DETECTED.  The SARS-CoV-2 RNA is generally detectable in upper respiratory specimens during the acute phase of infection. The lowest concentration of SARS-CoV-2 viral copies this assay can detect is 138 copies/mL. A negative result does not preclude SARS-Cov-2 infection and should not be used as the sole basis for treatment or other patient management decisions. A negative result may occur with  improper specimen collection/handling, submission of specimen other than nasopharyngeal swab, presence of viral mutation(s) within the areas targeted by this assay, and inadequate number of viral copies(<138 copies/mL). A negative result must be combined with clinical observations, patient history, and epidemiological information. The expected result is Negative.  Fact Sheet for Patients:  EntrepreneurPulse.com.au  Fact Sheet for Healthcare Providers:  IncredibleEmployment.be  This test is no t yet approved or cleared by the [place] FDA and  has been authorized for detection and/or diagnosis of SARS-CoV-2 by FDA under an Emergency Use Authorization (EUA). This EUA will remain  in effect (meaning this test can be used) for the duration of the COVID-19 declaration under Section 564(b)(1) of the Act, 21 U.S.C.section  360bbb-3(b)(1), unless the authorization is terminated  or revoked sooner.       Influenza A by PCR NEGATIVE NEGATIVE Final   Influenza B by PCR NEGATIVE NEGATIVE Final  Comment: (NOTE) The Xpert Xpress SARS-CoV-2/FLU/RSV plus assay is intended as an aid in the diagnosis of influenza from Nasopharyngeal swab specimens and should not be used as a sole basis for treatment. Nasal washings and aspirates are unacceptable for Xpert Xpress SARS-CoV-2/FLU/RSV testing.  Fact Sheet for Patients: EntrepreneurPulse.com.au  Fact Sheet for Healthcare Providers: IncredibleEmployment.be  This test is not yet approved or cleared by the [place] FDA and has been authorized for detection and/or diagnosis of SARS-CoV-2 by FDA under an Emergency Use Authorization (EUA). This EUA will remain in effect (meaning this test can be used) for the duration of the COVID-19 declaration under Section 564(b)(1) of the Act, 21 U.S.C. section 360bbb-3(b)(1), unless the authorization is terminated or revoked.  Performed at [facility], [place] [address], [place], [place] [address]   Blood culture (routine x 2)     Status: None   Collection Time: 12/26/20  3:50 PM   Specimen: BLOOD  Result Value Ref Range Status   Specimen Description   Final    BLOOD BLOOD RIGHT ARM Performed at [facility] [address], [place], [place] [address]    Special Requests   Final    BOTTLES DRAWN AEROBIC AND ANAEROBIC Blood Culture adequate volume Performed at [place] [address], [place], [place] [address]    Culture   Final    NO GROWTH 5 DAYS Performed at [facility], [place] [address], [place], [place] [address]    Report Status 12/31/2020 FINAL  Final  Blood culture (routine x 2)     Status: None   Collection Time: 12/26/20  3:52 PM   Specimen: BLOOD  Result Value Ref Range Status   Specimen Description    Final    BLOOD BLOOD RIGHT HAND Performed at [facility] [address] [place] [address] [place], [place] [address]    Special Requests   Final    BOTTLES DRAWN AEROBIC AND ANAEROBIC Blood Culture adequate volume Performed at [facility] [address] [place], [place] [address]    Culture   Final    NO GROWTH 5 DAYS Performed at [facility], [place] [address] [place], [place] [address]    Report Status 12/31/2020 FINAL  Final     Labs: BNP (last 3 results) Recent Labs    12/24/20 1200  BNP 962.2*   Basic Metabolic Panel: Recent Labs  Lab 12/26/20 1406 12/27/20 0005 12/28/20 0138 12/28/20 1020 12/29/20 0444 12/31/20 0508 01/01/21 0500  NA  --  137 136  --  137 136 135  K  --  3.6 3.1*  --  3.7 3.0* 3.1*  CL  --  109 108  --  108 102 103  CO2  --  24 23  --  24 27 27   GLUCOSE  --  109* 115*  --  95 101* 108*  BUN  --  37* 25*  --  16 18 16   CREATININE  --  0.93 0.93  --  0.86 0.93 0.82  CALCIUM  --  8.0* 7.6*  --  8.0* 8.4* 8.5*  MG 2.0  --   --  1.9  --   --  1.6*  PHOS 3.1  --   --   --   --   --   --    Liver Function Tests: Recent Labs  Lab 12/25/20 1555 12/27/20 0005  AST 40 31  ALT 63* 49*  ALKPHOS  121 84  BILITOT 1.2 1.0  PROT 6.7 5.8*  ALBUMIN 3.5 3.1*   No results for input(s): LIPASE, AMYLASE in the last 168 hours. No results for input(s): AMMONIA in the last 168 hours. CBC: Recent Labs  Lab 12/28/20 0138 12/29/20 0955 12/30/20 0915 12/31/20 0759 01/01/21 0500  WBC 0.9* 0.8* 0.9* 1.1* 2.8*  NEUTROABS 0.3* 0.2* 0.3* 0.4* 1.2*  HGB 8.4* 8.3* 7.8* 7.8* 8.4*  HCT 23.8* 24.0* 21.7* 22.9* 24.4*  MCV 91.9 91.6 90.8 91.2 91.4  PLT 51* 30* 18* 23* 32*   Cardiac Enzymes: No results for input(s): CKTOTAL, CKMB, CKMBINDEX, TROPONINI in the last 168 hours. BNP: Invalid input(s): POCBNP CBG: Recent Labs  Lab 12/26/20 1128  GLUCAP 193*   D-Dimer No results for input(s): DDIMER in the last 72 hours. Hgb  A1c No results for input(s): HGBA1C in the last 72 hours. Lipid Profile No results for input(s): CHOL, HDL, LDLCALC, TRIG, CHOLHDL, LDLDIRECT in the last 72 hours. Thyroid function studies No results for input(s): TSH, T4TOTAL, T3FREE, THYROIDAB in the last 72 hours.  Invalid input(s): FREET3 Anemia work up No results for input(s): VITAMINB12, FOLATE, FERRITIN, TIBC, IRON, RETICCTPCT in the last 72 hours. Urinalysis    Component Value Date/Time   COLORURINE YELLOW 12/26/2020 1121   APPEARANCEUR CLEAR 12/26/2020 1121   LABSPEC 1.020 12/26/2020 1121   PHURINE 6.0 12/26/2020 1121   GLUCOSEU NEGATIVE 12/26/2020 1121   GLUCOSEU NEGATIVE 11/17/2017 0829   HGBUR NEGATIVE 12/26/2020 1121   BILIRUBINUR NEGATIVE 12/26/2020 1121   BILIRUBINUR Negative 10/10/2018 0946   KETONESUR NEGATIVE 12/26/2020 1121   PROTEINUR NEGATIVE 12/26/2020 1121   UROBILINOGEN 0.2 10/10/2018 0946   UROBILINOGEN 0.2 11/17/2017 0829   NITRITE NEGATIVE 12/26/2020 1121   LEUKOCYTESUR NEGATIVE 12/26/2020 1121   Sepsis Labs Invalid input(s): PROCALCITONIN,  WBC,  LACTICIDVEN Microbiology Recent Results (from the past 240 hour(s))  Resp Panel by RT-PCR (Flu A&B, Covid) Nasopharyngeal Swab     Status: None   Collection Time: 12/24/20 12:03 PM   Specimen: Nasopharyngeal Swab; Nasopharyngeal(NP) swabs in vial transport medium  Result Value Ref Range Status   SARS Coronavirus 2 by RT PCR NEGATIVE NEGATIVE Final    Comment: (NOTE) SARS-CoV-2 target nucleic acids are NOT DETECTED.  The SARS-CoV-2 RNA is generally detectable in upper respiratory specimens during the acute phase of infection. The lowest concentration of SARS-CoV-2 viral copies this assay can detect is 138 copies/mL. A negative result does not preclude SARS-Cov-2 infection and should not be used as the sole basis for treatment or other patient management decisions. A negative result may occur with  improper specimen collection/handling, submission of  specimen other than nasopharyngeal swab, presence of viral mutation(s) within the areas targeted by this assay, and inadequate number of viral copies(<138 copies/mL). A negative result must be combined with clinical observations, patient history, and epidemiological information. The expected result is Negative.  Fact Sheet for Patients:  EntrepreneurPulse.com.au  Fact Sheet for Healthcare Providers:  IncredibleEmployment.be  This test is no t yet approved or cleared by the [place] FDA and  has been authorized for detection and/or diagnosis of SARS-CoV-2 by FDA under an Emergency Use Authorization (EUA). This EUA will remain  in effect (meaning this test can be used) for the duration of the COVID-19 declaration under Section 564(b)(1) of the Act, 21 U.S.C.section 360bbb-3(b)(1), unless the authorization is terminated  or revoked sooner.       Influenza A by PCR NEGATIVE NEGATIVE Final   Influenza B by  PCR NEGATIVE NEGATIVE Final    Comment: (NOTE) The Xpert Xpress SARS-CoV-2/FLU/RSV plus assay is intended as an aid in the diagnosis of influenza from Nasopharyngeal swab specimens and should not be used as a sole basis for treatment. Nasal washings and aspirates are unacceptable for Xpert Xpress SARS-CoV-2/FLU/RSV testing.  Fact Sheet for Patients: EntrepreneurPulse.com.au  Fact Sheet for Healthcare Providers: IncredibleEmployment.be  This test is not yet approved or cleared by the [place] FDA and has been authorized for detection and/or diagnosis of SARS-CoV-2 by FDA under an Emergency Use Authorization (EUA). This EUA will remain in effect (meaning this test can be used) for the duration of the COVID-19 declaration under Section 564(b)(1) of the Act, 21 U.S.C. section 360bbb-3(b)(1), unless the authorization is terminated or revoked.  Performed at [facility], [place]  [address] [place], [place] [address]   Blood culture (routine x 2)     Status: None   Collection Time: 12/26/20  3:50 PM   Specimen: BLOOD  Result Value Ref Range Status   Specimen Description   Final    BLOOD BLOOD RIGHT ARM Performed at [facility] [address] [place], [place] [address]    Special Requests   Final    BOTTLES DRAWN AEROBIC AND ANAEROBIC Blood Culture adequate volume Performed at [facility] [address], [place], [place] [address]    Culture   Final    NO GROWTH 5 DAYS Performed at [facility], [place] [address] [place], [place] [address]    Report Status 12/31/2020 FINAL  Final  Blood culture (routine x 2)     Status: None   Collection Time: 12/26/20  3:52 PM   Specimen: BLOOD  Result Value Ref Range Status   Specimen Description   Final    BLOOD BLOOD RIGHT HAND Performed at [facility] [address] [place], [place] [address]    Special Requests   Final    BOTTLES DRAWN AEROBIC AND ANAEROBIC Blood Culture adequate volume Performed at [facility] [address], [place],  [address]    Culture   Final    NO GROWTH 5 DAYS Performed at [facility], [place] [address], [place],  [address]    Report Status 12/31/2020 FINAL  Final     Time coordinating discharge: Over 30 minutes  SIGNED:   Ashtynn Berke J [place] DO  [facility] 01/01/2021, 1:52 PM

## 2021-01-01 NOTE — TOC Transition Note (Signed)
Transition of Care [facility]) - CM/SW Discharge Note   Patient Details  Name: Joe Hill MRN: [id number] Date of Birth: 01-14-1941  Transition of Care [facility] CM/SW Contact:  Dessa Phi, RN Phone Number: 01/01/2021, 11:52 AM   Clinical Narrative:  d/c today No further CM needs.See all prior CM notes for additonal info.     Final next level of care: OP Rehab Barriers to Discharge: No Barriers Identified   Patient Goals and CMS Choice        Discharge Placement                       Discharge Plan and Services                                     Social Determinants of Health (SDOH) Interventions     Readmission Risk Interventions No flowsheet data found.

## 2021-01-01 NOTE — Progress Notes (Signed)
Bilateral lower extremity venous duplex has been completed. Preliminary results can be found in CV Proc through chart review.   01/01/21 10:32 AM Joe Hill RVT

## 2021-01-01 NOTE — Progress Notes (Signed)
HEMATOLOGY-ONCOLOGY PROGRESS NOTE  SUBJECTIVE: Overall feels better.  He notes swelling in his bilateral lower extremities.  No pain to his calves.  Denies bleeding.  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[place])   11/18/2020 Cancer Staging   Staging form: Lung, AJCC 8th Edition - Clinical: Stage IVA Laney Pastor, cN3, cM1b) - Signed by Curt Bears, MD on 11/18/2020    11/27/2020 -  Chemotherapy   Patient is on Treatment Plan : LUNG CARBOplatin / Pemetrexed / Pembrolizumab q21d Induction x 4 cycles / Maintenance Pemetrexed + Pembrolizumab        REVIEW OF SYSTEMS:   Constitutional: Denies fevers, chills Eyes: Denies blurriness of vision Ears, nose, mouth, throat, and face: Denies epistaxis Respiratory: Denies cough, dyspnea or wheezes Cardiovascular: Denies palpitation, chest discomfort Gastrointestinal:  Denies nausea, heartburn or change in bowel habits Skin: Denies abnormal skin rashes Lymphatics: Denies new lymphadenopathy  Neurological:Denies numbness, tingling or new weaknesses Behavioral/Psych: Mood is stable, no new changes  Extremities: Reports lower extremity edema which developed Tuesday All other systems were reviewed with the patient and are negative.  I have reviewed the past medical history, past surgical history, social history and family history with the patient and they are unchanged from previous note.   PHYSICAL EXAMINATION: ECOG PERFORMANCE STATUS: 1 - Symptomatic but completely ambulatory  Vitals:   01/01/21 0153 01/01/21 0447  BP: (!) 108/49 102/77  Pulse: (!) 105 97  Resp: 16 18  Temp: 98.8 F (37.1 C) 98.2 F (36.8 C)  SpO2: 99% 99%   Filed Weights   12/26/20 1721  Weight: 78.7 kg    Intake/Output from previous day: 12/07 0701 - 12/08 0700 In: 120 [P.O.:120] Out: 1195 [Urine:1195]  GENERAL:alert, no distress and comfortable SKIN: skin color,  texture, turgor are normal, no rashes or significant lesions EYES: normal, Conjunctiva are pink and non-injected, sclera clear LUNGS: clear to auscultation and percussion with normal breathing effort HEART: regular rate & rhythm and no murmurs pitting edema to the bilateral lower extremities, left slightly larger than right ABDOMEN:abdomen soft, non-tender and normal bowel sounds NEURO: alert & oriented x 3 with fluent speech, no focal motor/sensory deficits  LABORATORY DATA:  I have reviewed the data as listed CMP Latest Ref Rng & Units 01/01/2021 12/31/2020 12/29/2020  Glucose 70 - 99 mg/dL 108(H) 101(H) 95  BUN 8 - 23 mg/dL _0 Creatinine 0.61 - 1.24 mg/dL 0.82 0.93 0.86  Sodium 135 - 145 mmol/L 135 136 137  Potassium 3.5 - 5.1 mmol/L 3.1(L) 3.0(L) 3.7  Chloride 98 - 111 mmol/L 103 102 108  CO2 22 - 32 mmol/L _1 Calcium 8.9 - 10.3 mg/dL 8.5(L) 8.4(L) 8.0(L)  Total Protein 6.5 - 8.1 g/dL - - -  Total Bilirubin 0.3 - 1.2 mg/dL - - -  Alkaline Phos 38 - 126 U/L - - -  AST 15 - 41 U/L - - -  ALT 0 - 44 U/L - - -    Lab Results  Component Value Date   WBC 2.8 (L) 01/01/2021   HGB 8.4 (L) 01/01/2021   HCT 24.4 (L) 01/01/2021   MCV 91.4 01/01/2021   PLT 32 (L) 01/01/2021   NEUTROABS 1.2 (L) 01/01/2021    DG Chest 2 View  Result Date: 12/24/2020 CLINICAL DATA:  Shortness of breath, difficulty breathing EXAM: CHEST - 2 VIEW COMPARISON:  Chest radiograph 12/15/2020 FINDINGS: The  cardiomediastinal silhouette is stable. Increased interstitial markings are seen throughout both lungs likely reflecting chronic changes on a background of emphysema as seen on prior CT chest. Patchy opacities in the left base are unchanged. There is no new or worsening focal airspace disease. The known left upper lobe nodule is not well assessed on the current study. There is no pleural effusion or pneumothorax. There is no acute osseous abnormality. IMPRESSION: Patchy opacities in the left base, not  significantly changed and again favored to reflect atelectasis. No new or worsening airspace disease. Electronically Signed   By: Valetta Mole M.D.   On: 12/24/2020 11:49   DG Chest 2 View  Result Date: 12/15/2020 CLINICAL DATA:  Cough and congestion EXAM: CHEST - 2 VIEW COMPARISON:  Chest x-ray dated October 28, 2020 FINDINGS: Cardiac and mediastinal contours within normal limits. Unchanged left upper lobe mass. New mild bibasilar opacities. No pleural effusion or pneumothorax. Inferior right costophrenic angle is excluded from the field of view. IMPRESSION: 1. New mild bibasilar opacities differential includes atelectasis, aspiration or infection. 2. Left upper lobe mass, similar to prior radiograph. Electronically Signed   By: Yetta Glassman M.D.   On: 12/15/2020 15:50   DG Ribs Unilateral W/Chest Left  Result Date: 12/26/2020 CLINICAL DATA:  Fall, left anterior rib pain EXAM: LEFT RIBS AND CHEST - 3+ VIEW COMPARISON:  Chest radiographs and CT chest, 12/24/2020 FINDINGS: No displaced fracture or other bone lesions are seen involving the ribs. There is no evidence of pneumothorax or pleural effusion. Heterogeneous airspace opacity of the left lung base. Unchanged elevation of the left hemidiaphragm. Heart size and mediastinal contours are within normal limits. IMPRESSION: 1. No displaced rib fracture. 2. Heterogeneous airspace opacity of the left lung base, consistent with infection or aspiration, as seen on prior. Electronically Signed   By: Delanna Ahmadi M.D.   On: 12/26/2020 12:14   CT Head Wo Contrast  Result Date: 12/26/2020 CLINICAL DATA:  Neck trauma. Head trauma, minor. Additional history provided: Unwitnessed syncopal episode and fall. Patient reports left rib pain, difficulty taking deep breaths. EXAM: CT HEAD WITHOUT CONTRAST CT CERVICAL SPINE WITHOUT CONTRAST TECHNIQUE: Multidetector CT imaging of the head and cervical spine was performed following the standard protocol without  intravenous contrast. Multiplanar CT image reconstructions of the cervical spine were also generated. COMPARISON:  Brain MRI 10/09/2020. FINDINGS: CT HEAD FINDINGS Brain: Mild-to-moderate generalized cerebral atrophy. Comparatively mild cerebellar atrophy. 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 Background moderate/advanced patchy and ill-defined hypoattenuation within the cerebral white matter, nonspecific but compatible with chronic small vessel ischemic disease. There is no acute intracranial hemorrhage. No demarcated cortical infarct. No extra-axial fluid collection. No evidence of an intracranial mass. No midline shift. Vascular: No hyperdense vessel.  Atherosclerotic calcifications. Skull: Normal. Negative for fracture or focal lesion. Sinuses/Orbits: Visualized orbits show no acute finding. Mild mucosal thickening within the bilateral ethmoid and maxillary sinuses at the imaged levels. CT CERVICAL SPINE FINDINGS Alignment: Straightening of the expected cervical lordosis. No significant spondylolisthesis. Skull base and vertebrae: The basion-dental and atlanto-dental intervals are maintained.No evidence of acute fracture to the cervical spine. Soft tissues and spinal canal: No prevertebral fluid or swelling. No visible canal hematoma. Disc levels: Cervical spondylosis with multilevel disc space narrowing, disc bulges, posterior disc osteophytes, endplate spurring, uncovertebral hypertrophy and facet arthrosis. Disc space narrowing is greatest at  C5-C6, C6-C7 and T2-T3 (moderate at these levels). No appreciable levels of severe spinal canal stenosis. Multilevel bony neural foraminal narrowing. Multilevel ventral osteophytes. Upper chest: No consolidation within the imaged lung apices. No visible pneumothorax. Centrilobular and paraseptal emphysema with  biapical bulla. Biapical pleuroparenchymal scarring. IMPRESSION: CT head: 1. No evidence of acute intracranial abnormality. 2. 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 3. Mild paranasal sinus disease at the imaged levels. CT cervical spine: 1. No evidence of acute fracture to the cervical spine. 2. Straightening of the expected cervical lordosis. 3. Cervical spondylosis, as described. 4. Emphysema (ICD10-J43.9). Associated biapical pleuroparenchymal scarring and biapical bulla. Electronically Signed   By: Kellie Simmering D.O.   On: 12/26/2020 13:33   CT Chest W Contrast  Addendum Date: 12/27/2020   ADDENDUM REPORT: 12/27/2020 10:07 ADDENDUM: Upon reassessing imaging there is considerable artifact passing through the RIGHT lower chest and this study is venous phase. Despite images that show clear intraluminal low attenuation and high suspicion for pulmonary embolism there is question due to the artifact seen in this area. Furthermore, to help determine best future management in this complex patient would suggest a study performed in the appropriate phase, CT PE protocol with the arms up if possible. These results were called by telephone at the time of interpretation on 12/27/2020 at 10:07 am to provider Hosie Poisson MD, who verbally acknowledged these results. Electronically Signed   By: Zetta Bills M.D.   On: 12/27/2020 10:07   Result Date: 12/27/2020 CLINICAL DATA:  History of trauma, unwitnessed syncopal episode in an 80 year old male with history of pulmonary neoplasm. EXAM: CT CHEST WITH CONTRAST TECHNIQUE: Multidetector CT imaging of the chest was performed during intravenous contrast administration. CONTRAST:  38m OMNIPAQUE IOHEXOL 350 MG/ML SOLN COMPARISON:  December 24, 2020 chest CT. FINDINGS: Cardiovascular: The aorta is normal caliber. Central pulmonary vasculature is normal caliber.  Filling defects in the RIGHT lower lobe segmental and subsegmental branches to posterior basal segment (image 126/3) this is in an area of beam hardening artifact. The study is a venous phase evaluation not performed for PE assessment. No additional areas are demonstrated. Heart size is normal without pericardial effusion. There is straightening of the interventricular septum that was present previously. Mediastinum/Nodes: Interval decrease in size of proximally 2 cm LEFT paramediastinal mass contiguous with AP window soft tissue is stable in the short interval. Small lymph nodes throughout the remainder of the mediastinum likewise are unchanged. Lungs/Pleura: LEFT lower lobe nodularity and airspace process showing no change in the short interval. Signs of pulmonary emphysema and LEFT upper lobe nodule as discussed. Nodular changes have developed in the RIGHT lower lobe, multifocal nodularity since the previous study. Stable background subsolid nodule (image 119/6) 13 x 15 mm, this was not present in August of 2022 nor in September and is therefore favored to represent post inflammatory process. There is mild septal thickening in the RIGHT upper lobe that is new from recent imaging. Mild bronchial wall thickening is present throughout the chest. Upper Abdomen: Incidental imaging of upper abdominal contents without acute process. 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 No acute upper abdominal process. Musculoskeletal: Signs of subacute sternal fracture, sclerotic margins without change, no substantial displacement. Spinal degenerative changes. Costochondral elements are intact. No signs of displaced rib fracture. Visualized clavicles and scapulae are intact.  IMPRESSION: Small RIGHT lower lobe pulmonary emboli. Straightening of the interventricular septum is a stable finding and may relate to baseline elevated RIGHT heart  pressures. Echocardiographic correlation may be helpful as warranted. Worsening of multifocal pneumonia. Signs of primary lung cancer in the LEFT chest with mediastinal involvement as described previously. 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 Sternal fracture favored to be subacute is new since September of 2022. Not substantially changed since December 24, 2020. Aortic Atherosclerosis (ICD10-I70.0) and Emphysema (ICD10-J43.9). Critical Value/emergent results were called by telephone at the time of interpretation on 12/26/2020 at 1:42 pm to provider [facility] , who verbally acknowledged these results. Electronically Signed: By: Zetta Bills M.D. On: 12/26/2020 13:43   CT Angio Chest Pulmonary Embolism (PE) W or WO Contrast  Result Date: 12/27/2020 CLINICAL DATA:  PE suspected, high probability. Possible PE versus artifact identified incidentally on a CT scan of the chest with contrast from yesterday. EXAM: CT ANGIOGRAPHY CHEST WITH CONTRAST TECHNIQUE: Multidetector CT imaging of the chest was performed using the standard protocol during bolus administration of intravenous contrast. Multiplanar CT image reconstructions and MIPs were obtained to evaluate the vascular anatomy. CONTRAST:  66mL OMNIPAQUE IOHEXOL 350 MG/ML SOLN COMPARISON:  CT scan of the chest 12/26/2020 FINDINGS: Cardiovascular: Satisfactory opacification of the pulmonary arteries to the segmental level. No evidence of pulmonary embolism. Normal heart size. The previously questioned pulmonary emboli are not evident on today's exam and almost certainly represented focal beam hardening artifact. No pericardial effusion. Aortic and coronary artery atherosclerotic calcifications. Mediastinum/Nodes: Stable left paramediastinal mass measuring approximately 2 cm in contiguous with soft tissue in the AP window. Additionally, small scattered mediastinal lymph nodes are also unchanged compared to recent prior imaging.  Lungs/Pleura: Continued stability of nonspecific left lower lobe nodularity and left upper lobe pulmonary nodule. Stable appearance of multifocal airspace opacifications in the right lower lobe. Background of moderately severe centrilobular pulmonary emphysema. Slightly increased right lower lobe atelectasis. No pleural effusion or pneumothorax. Overall, unchanged compared to yesterday. Upper Abdomen: No acute abnormality. Musculoskeletal: No acute abnormality. Review of the MIP images confirms the above findings. IMPRESSION: 1. Dedicated CTA imaging confirms that the previously suspected right lower lobe segmental pulmonary emboli were in fact artifactual. 2. Otherwise, unchanged appearance of multifocal pneumonia and primary left chest lung cancer with mediastinal involvement. Aortic Atherosclerosis (ICD10-I70.0) and Emphysema (ICD10-J43.9). Electronically Signed   By: Jacqulynn Cadet M.D.   On: 12/27/2020 13:38   CT Angio Chest PE W and/or Wo Contrast  Result Date: 12/24/2020 CLINICAL DATA:  Difficulty breathing.  History of lung cancer. EXAM: CT ANGIOGRAPHY CHEST WITH CONTRAST TECHNIQUE: Multidetector CT imaging of the chest was performed using the standard protocol during bolus administration of intravenous contrast. Multiplanar CT image reconstructions and MIPs were obtained to evaluate the vascular anatomy. CONTRAST:  30mL OMNIPAQUE IOHEXOL 350 MG/ML SOLN COMPARISON:  October 15, 2020.  October 15, 2013. FINDINGS: Cardiovascular: Satisfactory opacification of the pulmonary arteries to the segmental level. No evidence of pulmonary embolism. Normal heart size. No pericardial effusion. Mild coronary artery calcifications are noted. Mediastinum/Nodes: Thyroid gland and esophagus are unremarkable. 4.7 x 2.2 cm adenopathy is noted in aortopulmonary window. Lungs/Pleura: Emphysematous disease is noted. No pneumothorax or pleural effusion is noted. Patchy airspace opacities are noted in the left lower lobe  concerning for pneumonia. 2.2 x 2.0 cm mass is noted anteriorly in the left upper lobe which does not appear to be significantly changed compared to prior exam and  is concerning for malignancy. Several small ill-defined opacities are noted in the right lower lobe which may represent focal inflammation. Upper Abdomen: No acute abnormality. Musculoskeletal: No chest wall abnormality. No acute or significant osseous findings. Review of the MIP images confirms the above findings. IMPRESSION: No definite evidence of pulmonary embolus. New patchy airspace opacities are noted in the left lower lobe and to a lesser degree in the right lower lobe, concerning for multifocal pneumonia. 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 Aortic Atherosclerosis (ICD10-I70.0) and Emphysema (ICD10-J43.9). Electronically Signed   By: Marijo Conception M.D.   On: 12/24/2020 16:50   CT Cervical Spine Wo Contrast  Result Date: 12/26/2020 CLINICAL DATA:  Neck trauma. Head trauma, minor. Additional history provided: Unwitnessed syncopal episode and fall. Patient reports left rib pain, difficulty taking deep breaths. EXAM: CT HEAD WITHOUT CONTRAST CT CERVICAL SPINE WITHOUT CONTRAST TECHNIQUE: Multidetector CT imaging of the head and cervical spine was performed following the standard protocol without intravenous contrast. Multiplanar CT image reconstructions of the cervical spine were also generated. COMPARISON:  Brain MRI 10/09/2020. FINDINGS: CT HEAD FINDINGS Brain: Mild-to-moderate generalized cerebral atrophy. Comparatively mild cerebellar atrophy. 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 Background moderate/advanced patchy and ill-defined hypoattenuation within the  cerebral white matter, nonspecific but compatible with chronic small vessel ischemic disease. There is no acute intracranial hemorrhage. No demarcated cortical infarct. No extra-axial fluid collection. No evidence of an intracranial mass. No midline shift. Vascular: No hyperdense vessel.  Atherosclerotic calcifications. Skull: Normal. Negative for fracture or focal lesion. Sinuses/Orbits: Visualized orbits show no acute finding. Mild mucosal thickening within the bilateral ethmoid and maxillary sinuses at the imaged levels. CT CERVICAL SPINE FINDINGS Alignment: Straightening of the expected cervical lordosis. No significant spondylolisthesis. Skull base and vertebrae: The basion-dental and atlanto-dental intervals are maintained.No evidence of acute fracture to the cervical spine. Soft tissues and spinal canal: No prevertebral fluid or swelling. No visible canal hematoma. Disc levels: Cervical spondylosis with multilevel disc space narrowing, disc bulges, posterior disc osteophytes, endplate spurring, uncovertebral hypertrophy and facet arthrosis. Disc space narrowing is greatest at C5-C6, C6-C7 and T2-T3 (moderate at these levels). No appreciable levels of severe spinal canal stenosis. Multilevel bony neural foraminal narrowing. Multilevel ventral osteophytes. Upper chest: No consolidation within the imaged lung apices. No visible pneumothorax. Centrilobular and paraseptal emphysema with biapical bulla. Biapical pleuroparenchymal scarring. IMPRESSION: CT head: 1. No evidence of acute intracranial abnormality. 2. 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 3. Mild paranasal sinus disease at the imaged levels. CT cervical spine: 1. No evidence of acute fracture to the cervical spine. 2. Straightening of the expected cervical lordosis. 3. Cervical spondylosis, as described. 4. Emphysema (ICD10-J43.9).  Associated biapical pleuroparenchymal scarring and biapical bulla. Electronically Signed   By: Kellie Simmering D.O.   On: 12/26/2020 13:33   ECHOCARDIOGRAM COMPLETE  Result Date: 12/27/2020    ECHOCARDIOGRAM REPORT   Patient Name:   Joe Hill Date of Exam: 12/27/2020 Medical Rec #:  [id number]      Height:       70.0 in Accession #:    [id number]     Weight:  173.5 lb Date of Birth:  28-Feb-1940      BSA:          1.965 m Patient Age:    40 years       BP:           106/73 mmHg Patient Gender: M              HR:           81 bpm. Exam Location:  Inpatient Procedure: 2D Echo, Cardiac Doppler and Color Doppler Indications:    Pulmonary Embolus I26.09  History:        Patient has no prior history of Echocardiogram examinations.                 COPD; Risk Factors:Hypertension.  Sonographer:    Bernadene Person RDCS Referring Phys: [id number] [place]  1. Left ventricular ejection fraction, by estimation, is 40 to 45%. The left ventricle has mildly decreased function. The left ventricle demonstrates global hypokinesis. The left ventricular internal cavity size was mildly dilated. Left ventricular diastolic function could not be evaluated.  2. Right ventricular systolic function is normal. The right ventricular size is normal. There is normal pulmonary artery systolic pressure.  3. Left atrial size was mildly dilated.  4. Right atrial size was mildly dilated.  5. The mitral valve is normal in structure. Trivial mitral valve regurgitation.  6. The aortic valve is calcified. There is mild calcification of the aortic valve. There is mild thickening of the aortic valve. Aortic valve regurgitation is mild to moderate.  7. Aortic dilatation noted. There is borderline dilatation of the ascending aorta, measuring 39 mm. FINDINGS  Left Ventricle: Left ventricular ejection fraction, by estimation, is 40 to 45%. The left ventricle has mildly decreased function. The left ventricle demonstrates global  hypokinesis. The left ventricular internal cavity size was mildly dilated. There is  no left ventricular hypertrophy. Left ventricular diastolic function could not be evaluated due to atrial fibrillation. Left ventricular diastolic function could not be evaluated. Right Ventricle: The right ventricular size is normal. Right vetricular wall thickness was not well visualized. Right ventricular systolic function is normal. There is normal pulmonary artery systolic pressure. The tricuspid regurgitant velocity is 2.18 m/s, and with an assumed right atrial pressure of 3 mmHg, the estimated right ventricular systolic pressure is 75.9 mmHg. Left Atrium: Left atrial size was mildly dilated. Right Atrium: Right atrial size was mildly dilated. Pericardium: There is no evidence of pericardial effusion. Mitral Valve: The mitral valve is normal in structure. Trivial mitral valve regurgitation. Tricuspid Valve: The tricuspid valve is grossly normal. Tricuspid valve regurgitation is trivial. Aortic Valve: The aortic valve is calcified. There is mild calcification of the aortic valve. There is mild thickening of the aortic valve. There is mild aortic valve annular calcification. Aortic valve regurgitation is mild to moderate. Aortic regurgitation PHT measures 326 msec. Pulmonic Valve: The pulmonic valve was not well visualized. Pulmonic valve regurgitation is not visualized. Aorta: Aortic dilatation noted. There is borderline dilatation of the ascending aorta, measuring 39 mm. IAS/Shunts: The atrial septum is grossly normal.  LEFT VENTRICLE PLAX 2D LVIDd:         5.00 cm LVIDs:         4.20 cm LV PW:         1.00 cm LV IVS:        1.00 cm LVOT diam:     2.20 cm LV SV:  69 LV SV Index:   35 LVOT Area:     3.80 cm  LV Volumes (MOD) LV vol d, MOD A2C: 126.0 ml LV vol d, MOD A4C: 96.6 ml LV vol s, MOD A2C: 70.9 ml LV vol s, MOD A4C: 58.1 ml LV SV MOD A2C:     55.1 ml LV SV MOD A4C:     96.6 ml LV SV MOD BP:      49.7 ml RIGHT  VENTRICLE TAPSE (M-mode): 1.7 cm LEFT ATRIUM             Index        RIGHT ATRIUM           Index LA diam:        4.60 cm 2.34 cm/m   RA Area:     19.10 cm LA Vol (A2C):   62.9 ml 32.01 ml/m  RA Volume:   56.70 ml  28.86 ml/m LA Vol (A4C):   57.7 ml 29.36 ml/m LA Biplane Vol: 66.3 ml 33.74 ml/m  AORTIC VALVE LVOT Vmax:   99.30 cm/s LVOT Vmean:  66.200 cm/s LVOT VTI:    0.182 m AI PHT:      326 msec  AORTA Ao Root diam: 3.90 cm Ao Asc diam:  3.90 cm TRICUSPID VALVE TR Peak grad:   19.0 mmHg TR Vmax:        218.00 cm/s  SHUNTS Systemic VTI:  0.18 m Systemic Diam: 2.20 cm Mertie Moores MD Electronically signed by Mertie Moores MD Signature Date/Time: 12/27/2020/4:31:49 PM    Final     ASSESSMENT AND PLAN: This is a very pleasant 80 year old white male diagnosed with a stage IV (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 The patient started systemic chemotherapy with carboplatin for AUC of 5, Alimta 500 Mg/M2 and Keytruda 200 Mg IV every 3 weeks status post 2 cycles.  He is now admitted following a syncopal episode and he is being treated for pneumonia.  CBC from today has been reviewed and overall counts are improving.  His WBC is 2.8 with an ANC of 1.2.  We will administer Granix again today.  Hemoglobin has improved to 8.4 and platelets are up to 32,000.  He has no active bleeding.  He is currently off anticoagulation which he has been taking for A. fib.  He has developed lower extremity edema this AM.  Discussed with hospitalist who plans to perform Doppler ultrasound.  If DVT is identified, he will need to resume anticoagulation but would not recommend restarting this until platelet count is 50,000 or higher  consistently.  From my standpoint, the patient could potentially go home later today if Doppler ultrasound negative and after he receives a Granix injection.  He is agreeable to coming to the cancer center tomorrow for repeat lab work and a visit.  However, we will follow-up on counts tomorrow if he remains in the hospital.  Future Appointments  Date Time Provider [place]  01/01/2021  2:00 PM [facility] [place] [facility]  01/01/2021  4:00 PM [facility] None  01/08/2021  9:00 AM [facility] None  01/08/2021  9:30 AM Curt Bears, MD [facility] None  01/08/2021 10:30 AM [facility] INFUSION [facility] None  01/15/2021  4:00 PM [facility] None  01/22/2021  4:00 PM [facility] None  01/29/2021 10:00 AM [facility] None  01/29/2021 10:30 AM Heilingoetter, Cassandra L, PA-C [facility] None  01/29/2021 11:30 AM [facility] INFUSION [facility] None  01/30/2021  4:00 PM Evans Lance, MD [facility]  02/04/2021  4:00 PM [facility] None  02/11/2021  4:00 PM [facility] None  02/18/2021 10:00 AM [facility] None  02/18/2021 10:30 AM Curt Bears, MD [facility] None  02/18/2021 11:30 AM [facility] INFUSION [facility] None  02/25/2021  4:00 PM [facility] None  03/04/2021  4:00 PM [facility] None  03/11/2021 10:00 AM [facility] None  03/11/2021 10:30 AM Heilingoetter, Cassandra L, PA-C [facility] None  03/11/2021 11:30 AM [facility] INFUSION [facility] None  05/12/2021  8:20 AM Yong Channel, Brayton Mars, MD [facility]      LOS: 5 days   Mikey Bussing, DNP, AGPCNP-BC, AOCNP 01/01/21

## 2021-01-01 NOTE — Progress Notes (Signed)
Went over discharge papers with patient and son.  All questions answered.  VSS.  AVS given.  Wheeled out by NT.

## 2021-01-01 NOTE — Telephone Encounter (Signed)
Scheduled per sch msg. Called and left msg. Patient will get printout upon d/c

## 2021-01-02 ENCOUNTER — Telehealth: Payer: Self-pay

## 2021-01-02 ENCOUNTER — Inpatient Hospital Stay (HOSPITAL_BASED_OUTPATIENT_CLINIC_OR_DEPARTMENT_OTHER): Payer: Medicare Other | Admitting: Physician Assistant

## 2021-01-02 ENCOUNTER — Inpatient Hospital Stay: Payer: Medicare Other

## 2021-01-02 ENCOUNTER — Other Ambulatory Visit: Payer: Self-pay

## 2021-01-02 VITALS — BP 92/58 | HR 97 | Temp 97.7°F | Resp 17 | Ht 70.0 in | Wt 184.4 lb

## 2021-01-02 DIAGNOSIS — Z8673 Personal history of transient ischemic attack (TIA), and cerebral infarction without residual deficits: Secondary | ICD-10-CM | POA: Diagnosis not present

## 2021-01-02 DIAGNOSIS — Z79899 Other long term (current) drug therapy: Secondary | ICD-10-CM | POA: Diagnosis not present

## 2021-01-02 DIAGNOSIS — Z5112 Encounter for antineoplastic immunotherapy: Secondary | ICD-10-CM | POA: Diagnosis not present

## 2021-01-02 DIAGNOSIS — I10 Essential (primary) hypertension: Secondary | ICD-10-CM | POA: Diagnosis not present

## 2021-01-02 DIAGNOSIS — I959 Hypotension, unspecified: Secondary | ICD-10-CM

## 2021-01-02 DIAGNOSIS — D61818 Other pancytopenia: Secondary | ICD-10-CM

## 2021-01-02 DIAGNOSIS — C7972 Secondary malignant neoplasm of left adrenal gland: Secondary | ICD-10-CM | POA: Diagnosis not present

## 2021-01-02 DIAGNOSIS — Z7901 Long term (current) use of anticoagulants: Secondary | ICD-10-CM | POA: Diagnosis not present

## 2021-01-02 DIAGNOSIS — Z8546 Personal history of malignant neoplasm of prostate: Secondary | ICD-10-CM | POA: Diagnosis not present

## 2021-01-02 DIAGNOSIS — C3412 Malignant neoplasm of upper lobe, left bronchus or lung: Secondary | ICD-10-CM

## 2021-01-02 DIAGNOSIS — Z5111 Encounter for antineoplastic chemotherapy: Secondary | ICD-10-CM | POA: Diagnosis not present

## 2021-01-02 DIAGNOSIS — E860 Dehydration: Secondary | ICD-10-CM | POA: Diagnosis not present

## 2021-01-02 DIAGNOSIS — I4891 Unspecified atrial fibrillation: Secondary | ICD-10-CM | POA: Diagnosis not present

## 2021-01-02 DIAGNOSIS — Z9079 Acquired absence of other genital organ(s): Secondary | ICD-10-CM | POA: Diagnosis not present

## 2021-01-02 DIAGNOSIS — Z85118 Personal history of other malignant neoplasm of bronchus and lung: Secondary | ICD-10-CM | POA: Diagnosis not present

## 2021-01-02 DIAGNOSIS — Z86718 Personal history of other venous thrombosis and embolism: Secondary | ICD-10-CM | POA: Diagnosis not present

## 2021-01-02 DIAGNOSIS — C3492 Malignant neoplasm of unspecified part of left bronchus or lung: Secondary | ICD-10-CM | POA: Diagnosis not present

## 2021-01-02 LAB — CBC WITH DIFFERENTIAL (CANCER CENTER ONLY)
Abs Immature Granulocytes: 1.54 10*3/uL — ABNORMAL HIGH (ref 0.00–0.07)
Basophils Absolute: 0.1 10*3/uL (ref 0.0–0.1)
Basophils Relative: 1 %
Eosinophils Absolute: 0 10*3/uL (ref 0.0–0.5)
Eosinophils Relative: 0 %
HCT: 25.2 % — ABNORMAL LOW (ref 39.0–52.0)
Hemoglobin: 8.8 g/dL — ABNORMAL LOW (ref 13.0–17.0)
Immature Granulocytes: 10 %
Lymphocytes Relative: 6 %
Lymphs Abs: 1 10*3/uL (ref 0.7–4.0)
MCH: 31.9 pg (ref 26.0–34.0)
MCHC: 34.9 g/dL (ref 30.0–36.0)
MCV: 91.3 fL (ref 80.0–100.0)
Monocytes Absolute: 2.5 10*3/uL — ABNORMAL HIGH (ref 0.1–1.0)
Monocytes Relative: 16 %
Neutro Abs: 10.2 10*3/uL — ABNORMAL HIGH (ref 1.7–7.7)
Neutrophils Relative %: 67 %
Platelet Count: 63 10*3/uL — ABNORMAL LOW (ref 150–400)
RBC: 2.76 MIL/uL — ABNORMAL LOW (ref 4.22–5.81)
RDW: 13.8 % (ref 11.5–15.5)
Smear Review: NORMAL
WBC Count: 15.3 10*3/uL — ABNORMAL HIGH (ref 4.0–10.5)
nRBC: 1.4 % — ABNORMAL HIGH (ref 0.0–0.2)

## 2021-01-02 LAB — CMP (CANCER CENTER ONLY)
ALT: 67 U/L — ABNORMAL HIGH (ref 0–44)
AST: 33 U/L (ref 15–41)
Albumin: 3.2 g/dL — ABNORMAL LOW (ref 3.5–5.0)
Alkaline Phosphatase: 118 U/L (ref 38–126)
Anion gap: 10 (ref 5–15)
BUN: 16 mg/dL (ref 8–23)
CO2: 26 mmol/L (ref 22–32)
Calcium: 8.7 mg/dL — ABNORMAL LOW (ref 8.9–10.3)
Chloride: 104 mmol/L (ref 98–111)
Creatinine: 1.25 mg/dL — ABNORMAL HIGH (ref 0.61–1.24)
GFR, Estimated: 58 mL/min — ABNORMAL LOW (ref >60)
Glucose, Bld: 142 mg/dL — ABNORMAL HIGH (ref 70–99)
Potassium: 3.6 mmol/L (ref 3.5–5.1)
Sodium: 140 mmol/L (ref 135–145)
Total Bilirubin: 1 mg/dL (ref 0.3–1.2)
Total Protein: 6 g/dL — ABNORMAL LOW (ref 6.5–8.1)

## 2021-01-02 LAB — SAMPLE TO BLOOD BANK

## 2021-01-02 MED ORDER — SODIUM CHLORIDE 0.9 % IV SOLN: Freq: Once | INTRAVENOUS | Status: AC

## 2021-01-02 NOTE — Patient Instructions (Signed)

## 2021-01-02 NOTE — Telephone Encounter (Signed)
Transition Care Management Unsuccessful Follow-up Telephone Call  Date of discharge and from where:  01/01/21, [facility]   Attempts:  1st Attempt  Reason for unsuccessful TCM follow-up call:  Left voice message

## 2021-01-07 ENCOUNTER — Other Ambulatory Visit: Payer: Self-pay | Admitting: Family Medicine

## 2021-01-08 ENCOUNTER — Inpatient Hospital Stay: Payer: Medicare Other

## 2021-01-08 ENCOUNTER — Encounter: Payer: Self-pay | Admitting: Internal Medicine

## 2021-01-08 ENCOUNTER — Other Ambulatory Visit: Payer: Self-pay

## 2021-01-08 ENCOUNTER — Inpatient Hospital Stay (HOSPITAL_BASED_OUTPATIENT_CLINIC_OR_DEPARTMENT_OTHER): Payer: Medicare Other | Admitting: Internal Medicine

## 2021-01-08 VITALS — BP 89/57 | HR 82

## 2021-01-08 VITALS — BP 99/54 | HR 89 | Temp 97.8°F | Resp 19 | Ht 70.0 in | Wt 175.6 lb

## 2021-01-08 DIAGNOSIS — E876 Hypokalemia: Secondary | ICD-10-CM | POA: Diagnosis not present

## 2021-01-08 DIAGNOSIS — D61818 Other pancytopenia: Secondary | ICD-10-CM | POA: Diagnosis not present

## 2021-01-08 DIAGNOSIS — Z5112 Encounter for antineoplastic immunotherapy: Secondary | ICD-10-CM

## 2021-01-08 DIAGNOSIS — R5382 Chronic fatigue, unspecified: Secondary | ICD-10-CM

## 2021-01-08 DIAGNOSIS — Z5111 Encounter for antineoplastic chemotherapy: Secondary | ICD-10-CM

## 2021-01-08 DIAGNOSIS — C3492 Malignant neoplasm of unspecified part of left bronchus or lung: Secondary | ICD-10-CM

## 2021-01-08 DIAGNOSIS — E860 Dehydration: Secondary | ICD-10-CM | POA: Diagnosis not present

## 2021-01-08 DIAGNOSIS — D701 Agranulocytosis secondary to cancer chemotherapy: Secondary | ICD-10-CM | POA: Diagnosis not present

## 2021-01-08 DIAGNOSIS — T451X5A Adverse effect of antineoplastic and immunosuppressive drugs, initial encounter: Secondary | ICD-10-CM | POA: Diagnosis not present

## 2021-01-08 DIAGNOSIS — C3412 Malignant neoplasm of upper lobe, left bronchus or lung: Secondary | ICD-10-CM | POA: Diagnosis not present

## 2021-01-08 DIAGNOSIS — C7972 Secondary malignant neoplasm of left adrenal gland: Secondary | ICD-10-CM | POA: Diagnosis not present

## 2021-01-08 LAB — CBC WITH DIFFERENTIAL (CANCER CENTER ONLY)
Abs Immature Granulocytes: 0.52 10*3/uL — ABNORMAL HIGH (ref 0.00–0.07)
Basophils Absolute: 0.1 10*3/uL (ref 0.0–0.1)
Basophils Relative: 1 %
Eosinophils Absolute: 0 10*3/uL (ref 0.0–0.5)
Eosinophils Relative: 0 %
HCT: 27.5 % — ABNORMAL LOW (ref 39.0–52.0)
Hemoglobin: 9.3 g/dL — ABNORMAL LOW (ref 13.0–17.0)
Immature Granulocytes: 8 %
Lymphocytes Relative: 10 %
Lymphs Abs: 0.7 10*3/uL (ref 0.7–4.0)
MCH: 32.4 pg (ref 26.0–34.0)
MCHC: 33.8 g/dL (ref 30.0–36.0)
MCV: 95.8 fL (ref 80.0–100.0)
Monocytes Absolute: 0.7 10*3/uL (ref 0.1–1.0)
Monocytes Relative: 11 %
Neutro Abs: 4.8 10*3/uL (ref 1.7–7.7)
Neutrophils Relative %: 70 %
Platelet Count: 225 10*3/uL (ref 150–400)
RBC: 2.87 MIL/uL — ABNORMAL LOW (ref 4.22–5.81)
RDW: 19.1 % — ABNORMAL HIGH (ref 11.5–15.5)
WBC Count: 6.8 10*3/uL (ref 4.0–10.5)
nRBC: 0.3 % — ABNORMAL HIGH (ref 0.0–0.2)

## 2021-01-08 LAB — CMP (CANCER CENTER ONLY)
ALT: 27 U/L (ref 0–44)
AST: 22 U/L (ref 15–41)
Albumin: 3.2 g/dL — ABNORMAL LOW (ref 3.5–5.0)
Alkaline Phosphatase: 114 U/L (ref 38–126)
Anion gap: 10 (ref 5–15)
BUN: 18 mg/dL (ref 8–23)
CO2: 27 mmol/L (ref 22–32)
Calcium: 8.5 mg/dL — ABNORMAL LOW (ref 8.9–10.3)
Chloride: 101 mmol/L (ref 98–111)
Creatinine: 1.12 mg/dL (ref 0.61–1.24)
GFR, Estimated: >60 mL/min (ref >60)
Glucose, Bld: 156 mg/dL — ABNORMAL HIGH (ref 70–99)
Potassium: 3.1 mmol/L — ABNORMAL LOW (ref 3.5–5.1)
Sodium: 138 mmol/L (ref 135–145)
Total Bilirubin: 1.1 mg/dL (ref 0.3–1.2)
Total Protein: 6.2 g/dL — ABNORMAL LOW (ref 6.5–8.1)

## 2021-01-08 LAB — SAMPLE TO BLOOD BANK

## 2021-01-08 LAB — TSH: TSH: 1.714 u[IU]/mL (ref 0.320–4.118)

## 2021-01-08 MED ORDER — SODIUM CHLORIDE 0.9 % IV SOLN: Freq: Once | INTRAVENOUS | Status: AC

## 2021-01-08 MED ORDER — SODIUM CHLORIDE 0.9 % IV SOLN
200.0000 mg | Freq: Once | INTRAVENOUS | Status: AC
  Administered 2021-01-08: 200 mg via INTRAVENOUS
  Filled 2021-01-08: qty 8

## 2021-01-08 MED ORDER — SODIUM CHLORIDE 0.9 % IV SOLN
350.0000 mg | Freq: Once | INTRAVENOUS | Status: AC
  Administered 2021-01-08: 350 mg via INTRAVENOUS
  Filled 2021-01-08: qty 35

## 2021-01-08 MED ORDER — SODIUM CHLORIDE 0.9 % IV SOLN
150.0000 mg | Freq: Once | INTRAVENOUS | Status: AC
  Administered 2021-01-08: 150 mg via INTRAVENOUS
  Filled 2021-01-08: qty 150

## 2021-01-08 MED ORDER — SODIUM CHLORIDE 0.9 % IV SOLN
400.0000 mg/m2 | Freq: Once | INTRAVENOUS | Status: AC
  Administered 2021-01-08: 800 mg via INTRAVENOUS
  Filled 2021-01-08: qty 20

## 2021-01-08 MED ORDER — SODIUM CHLORIDE 0.9 % IV SOLN
10.0000 mg | Freq: Once | INTRAVENOUS | Status: AC
  Administered 2021-01-08: 10 mg via INTRAVENOUS
  Filled 2021-01-08: qty 10

## 2021-01-08 MED ORDER — PALONOSETRON HCL INJECTION 0.25 MG/5ML
0.2500 mg | Freq: Once | INTRAVENOUS | Status: AC
  Administered 2021-01-08: 0.25 mg via INTRAVENOUS
  Filled 2021-01-08: qty 5

## 2021-01-08 NOTE — Patient Instructions (Signed)
Steps to Quit Smoking Smoking tobacco is the leading cause of preventable death. It can affect almost every organ in the body. Smoking puts you and people around you at risk for many serious, long-lasting (chronic) diseases. Quitting smoking can be hard, but it is one of the best things that you can do for your health. It is never too late to quit. How do I get ready to quit? When you decide to quit smoking, make a plan to help you succeed. Before you quit: Pick a date to quit. Set a date within the next 2 weeks to give you time to prepare. Write down the reasons why you are quitting. Keep this list in places where you will see it often. Tell your family, friends, and co-workers that you are quitting. Their support is important. Talk with your doctor about the choices that may help you quit. Find out if your health insurance will pay for these treatments. Know the people, places, things, and activities that make you want to smoke (triggers). Avoid them. What first steps can I take to quit smoking? Throw away all cigarettes at home, at work, and in your car. Throw away the things that you use when you smoke, such as ashtrays and lighters. Clean your car. Make sure to empty the ashtray. Clean your home, including curtains and carpets. What can I do to help me quit smoking? Talk with your doctor about taking medicines and seeing a counselor at the same time. You are more likely to succeed when you do both. If you are pregnant or breastfeeding, talk with your doctor about counseling or other ways to quit smoking. Do not take medicine to help you quit smoking unless your doctor tells you to do so. To quit smoking: Quit right away Quit smoking totally, instead of slowly cutting back on how much you smoke over a period of time. Go to counseling. You are more likely to quit if you go to counseling sessions regularly. Take medicine You may take medicines to help you quit. Some medicines need a  prescription, and some you can buy over-the-counter. Some medicines may contain a drug called nicotine to replace the nicotine in cigarettes. Medicines may: Help you to stop having the desire to smoke (cravings). Help to stop the problems that come when you stop smoking (withdrawal symptoms). Your doctor may ask you to use: Nicotine patches, gum, or lozenges. Nicotine inhalers or sprays. Non-nicotine medicine that is taken by mouth. Find resources Find resources and other ways to help you quit smoking and remain smoke-free after you quit. These resources are most helpful when you use them often. They include: Online chats with a counselor. Phone quitlines. Printed self-help materials. Support groups or group counseling. Text messaging programs. Mobile phone apps. Use apps on your mobile phone or tablet that can help you stick to your quit plan. There are many free apps for mobile phones and tablets as well as websites. Examples include Quit Guide from the CDC and smokefree.gov  What things can I do to make it easier to quit?  Talk to your family and friends. Ask them to support and encourage you. Call a phone quitline (1-800-QUIT-NOW), reach out to support groups, or work with a counselor. Ask people who smoke to not smoke around you. Avoid places that make you want to smoke, such as: Bars. Parties. Smoke-break areas at work. Spend time with people who do not smoke. Lower the stress in your life. Stress can make you want to   smoke. Try these things to help your stress: Getting regular exercise. Doing deep-breathing exercises. Doing yoga. Meditating. Doing a body scan. To do this, close your eyes, focus on one area of your body at a time from head to toe. Notice which parts of your body are tense. Try to relax the muscles in those areas. How will I feel when I quit smoking? Day 1 to 3 weeks Within the first 24 hours, you may start to have some problems that come from quitting tobacco.  These problems are very bad 2-3 days after you quit, but they do not often last for more than 2-3 weeks. You may get these symptoms: Mood swings. Feeling restless, nervous, angry, or annoyed. Trouble concentrating. Dizziness. Strong desire for high-sugar foods and nicotine. Weight gain. Trouble pooping (constipation). Feeling like you may vomit (nausea). Coughing or a sore throat. Changes in how the medicines that you take for other issues work in your body. Depression. Trouble sleeping (insomnia). Week 3 and afterward After the first 2-3 weeks of quitting, you may start to notice more positive results, such as: Better sense of smell and taste. Less coughing and sore throat. Slower heart rate. Lower blood pressure. Clearer skin. Better breathing. Fewer sick days. Quitting smoking can be hard. Do not give up if you fail the first time. Some people need to try a few times before they succeed. Do your best to stick to your quit plan, and talk with your doctor if you have any questions or concerns. Summary Smoking tobacco is the leading cause of preventable death. Quitting smoking can be hard, but it is one of the best things that you can do for your health. When you decide to quit smoking, make a plan to help you succeed. Quit smoking right away, not slowly over a period of time. When you start quitting, seek help from your doctor, family, or friends. This information is not intended to replace advice given to you by your health care provider. Make sure you discuss any questions you have with your health care provider. Document Revised: 09/19/2020 Document Reviewed: 04/01/2018 Elsevier Patient Education  2022 Elsevier Inc.  

## 2021-01-08 NOTE — Progress Notes (Signed)
Per MD, ok to proceed with treatment BP 99/54

## 2021-01-08 NOTE — Progress Notes (Signed)
[facility] [phone]   [phone]  OFFICE PROGRESS NOTE  [name], [place] [address]  DIAGNOSIS: stage IV (T2 a, N3, M1 B) non-small cell lung cancer, adenocarcinoma presented with large central left upper lobe lung mass in addition to bilateral mediastinal lymphadenopathy and right upper lobe metastatic nodule as well as left adrenal metastasis diagnosed in October 2022.  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  Molecular studies showed no actionable mutations.  PDL1 Expression 5%  PRIOR THERAPY: None  CURRENT THERAPY: Systemic chemotherapy with carboplatin for AUC of 5, Alimta 500 Mg/M2 and Keytruda 200 Mg IV every 3 weeks, status post 2 cycles.  Starting from cycle #3 his dose of carboplatin will be reduced to AUC of 4 and Alimta 400 Mg/M2 secondary to intolerance.  INTERVAL HISTORY: Joe Hill 80 y.o. male returns to the clinic today for follow-up visit.  The patient is complaining of fatigue and generalized weakness.  He was admitted to the hospital 2 weeks ago with pneumonia.  He was treated with courses of antibiotic during his hospitalization.  He had pancytopenia at that time.  He is feeling little bit better today with no concerning complaints except for the fatigue and weakness.  He denied having any current chest pain but has shortness of breath with exertion with mild cough and no hemoptysis.  He denied having any fever or chills.  He has no nausea, vomiting, diarrhea or constipation.  He has occasional lightheadedness but he took his blood pressure medication earlier today and his blood pressure has been on the low side.  He is here today for evaluation before starting cycle #3 of his treatment.   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  MEDICATIONS:  Current Outpatient Medications  Medication Sig Dispense Refill   ADVIL 200 MG CAPS Take 200 mg by mouth every 6 (six) hours as needed (for mild pain or headaches).     albuterol (VENTOLIN HFA) 108 (90 Base) MCG/ACT inhaler TAKE 2 PUFFS BY MOUTH EVERY 6 HOURS AS NEEDED FOR WHEEZE OR SHORTNESS OF BREATH 6.7 each 2   dextromethorphan-guaiFENesin (MUCINEX DM) 30-600 MG 12hr tablet Take 1 tablet by mouth 2 (two) times daily as needed for cough.     diltiazem (CARDIZEM CD) 240 MG 24 hr capsule Take 1 capsule (240 mg total) by mouth daily. 30 capsule 2   feeding supplement (BOOST HIGH PROTEIN) LIQD Take 1 Container by mouth 2 (two) times daily.     folic acid (FOLVITE) 1 MG tablet Take 1 tablet (1 mg total) by mouth daily. 30 tablet 4   furosemide (LASIX) 40 MG tablet Take 1 tablet (40 mg total) by mouth daily for 7 days. 7 tablet 0   Nutritional Supplements (ENSURE COMPLETE PO) Take 237 mLs by mouth See admin instructions. Drink 237 ml's by mouth one to two times a day     pantoprazole (PROTONIX) 40 MG tablet Take 1 tablet (40 mg total) by mouth daily. 30 tablet 2  potassium chloride SA (KLOR-CON) 20 MEQ tablet Take 1 tablet (20 mEq total) by mouth daily. Take 2 tablets (51meq) for 2 days then take 1 tablet (53meq) daily (Patient taking differently: Take 20 mEq by mouth daily.) 90 tablet 3   prochlorperazine (COMPAZINE) 10 MG tablet Take 1 tablet (10 mg total) by mouth every 6 (six) hours as needed for nausea or vomiting. 30 tablet 0   No current facility-administered medications for this visit.    SURGICAL HISTORY:  Past Surgical History:  Procedure Laterality Date   APPENDECTOMY      CATARACT EXTRACTION     PROSTATE SURGERY     prostatectomy   VIDEO BRONCHOSCOPY WITH ENDOBRONCHIAL NAVIGATION N/A 10/30/2020   Procedure: VIDEO BRONCHOSCOPY WITH ENDOBRONCHIAL NAVIGATION;  Surgeon: Melrose Nakayama, MD;  Location: [facility];  Service: Thoracic;  Laterality: N/A;   VIDEO BRONCHOSCOPY WITH ENDOBRONCHIAL ULTRASOUND N/A 10/30/2020   Procedure: VIDEO BRONCHOSCOPY WITH ENDOBRONCHIAL ULTRASOUND;  Surgeon: Melrose Nakayama, MD;  Location: [facility];  Service: Thoracic;  Laterality: N/A;    REVIEW OF SYSTEMS:  Constitutional: positive for anorexia and fatigue Eyes: negative Ears, nose, mouth, throat, and face: negative Respiratory: positive for cough and dyspnea on exertion Cardiovascular: negative Gastrointestinal: negative Genitourinary:negative Integument/breast: negative Hematologic/lymphatic: negative Musculoskeletal:positive for muscle weakness Neurological: negative Behavioral/Psych: negative Endocrine: negative Allergic/Immunologic: negative   PHYSICAL EXAMINATION: General appearance: alert, cooperative, fatigued, and no distress Head: Normocephalic, without obvious abnormality, atraumatic Neck: no adenopathy, no JVD, supple, symmetrical, trachea midline, and thyroid not enlarged, symmetric, no tenderness/mass/nodules Lymph nodes: Cervical, supraclavicular, and axillary nodes normal. Resp: clear to auscultation bilaterally Back: symmetric, no curvature. ROM normal. No CVA tenderness. Cardio: regular rate and rhythm, S1, S2 normal, no murmur, click, rub or gallop GI: soft, non-tender; bowel sounds normal; no masses,  no organomegaly Extremities: extremities normal, atraumatic, no cyanosis or edema Neurologic: Alert and oriented X 3, normal strength and tone. Normal symmetric reflexes. Normal coordination and gait  ECOG PERFORMANCE STATUS: 1 - Symptomatic but completely ambulatory  Blood pressure (!) 99/54, pulse 89, temperature 97.8 F (36.6 C), temperature source  Tympanic, resp. rate 19, height $RemoveBe'5\' 10"'kZvNxFTpP$  (1.778 m), weight 175 lb 9.6 oz (79.7 kg), SpO2 98 %.  LABORATORY DATA: Lab Results  Component Value Date   WBC 6.8 01/08/2021   HGB 9.3 (L) 01/08/2021   HCT 27.5 (L) 01/08/2021   MCV 95.8 01/08/2021   PLT 225 01/08/2021      Chemistry      Component Value Date/Time   NA 140 01/02/2021 0832   NA 137 11/04/2020 0840   K 3.6 01/02/2021 0832   CL 104 01/02/2021 0832   CO2 26 01/02/2021 0832   BUN 16 01/02/2021 0832   BUN 15 11/04/2020 0840   CREATININE 1.25 (H) 01/02/2021 0832   CREATININE 0.97 10/15/2019 0907      Component Value Date/Time   CALCIUM 8.7 (L) 01/02/2021 0832   ALKPHOS 118 01/02/2021 0832   AST 33 01/02/2021 0832   ALT 67 (H) 01/02/2021 0832   BILITOT 1.0 01/02/2021 0832       RADIOGRAPHIC STUDIES: DG Chest 2 View  Result Date: 12/24/2020 CLINICAL DATA:  Shortness of breath, difficulty breathing EXAM: CHEST - 2 VIEW COMPARISON:  Chest radiograph 12/15/2020 FINDINGS: The cardiomediastinal silhouette is stable. Increased interstitial markings are seen throughout both lungs likely reflecting chronic changes on a background of emphysema as seen on prior CT chest. Patchy opacities in the left base are unchanged. There is no new or  worsening focal airspace disease. The known left upper lobe nodule is not well assessed on the current study. There is no pleural effusion or pneumothorax. There is no acute osseous abnormality. IMPRESSION: Patchy opacities in the left base, not significantly changed and again favored to reflect atelectasis. No new or worsening airspace disease. Electronically Signed   By: Valetta Mole M.D.   On: 12/24/2020 11:49   DG Chest 2 View  Result Date: 12/15/2020 CLINICAL DATA:  Cough and congestion EXAM: CHEST - 2 VIEW COMPARISON:  Chest x-ray dated October 28, 2020 FINDINGS: Cardiac and mediastinal contours within normal limits. Unchanged left upper lobe mass. New mild bibasilar opacities. No pleural  effusion or pneumothorax. Inferior right costophrenic angle is excluded from the field of view. IMPRESSION: 1. New mild bibasilar opacities differential includes atelectasis, aspiration or infection. 2. Left upper lobe mass, similar to prior radiograph. Electronically Signed   By: Yetta Glassman M.D.   On: 12/15/2020 15:50   DG Ribs Unilateral W/Chest Left  Result Date: 12/26/2020 CLINICAL DATA:  Fall, left anterior rib pain EXAM: LEFT RIBS AND CHEST - 3+ VIEW COMPARISON:  Chest radiographs and CT chest, 12/24/2020 FINDINGS: No displaced fracture or other bone lesions are seen involving the ribs. There is no evidence of pneumothorax or pleural effusion. Heterogeneous airspace opacity of the left lung base. Unchanged elevation of the left hemidiaphragm. Heart size and mediastinal contours are within normal limits. IMPRESSION: 1. No displaced rib fracture. 2. Heterogeneous airspace opacity of the left lung base, consistent with infection or aspiration, as seen on prior. Electronically Signed   By: Delanna Ahmadi M.D.   On: 12/26/2020 12:14   CT Head Wo Contrast  Result Date: 12/26/2020 CLINICAL DATA:  Neck trauma. Head trauma, minor. Additional history provided: Unwitnessed syncopal episode and fall. Patient reports left rib pain, difficulty taking deep breaths. EXAM: CT HEAD WITHOUT CONTRAST CT CERVICAL SPINE WITHOUT CONTRAST TECHNIQUE: Multidetector CT imaging of the head and cervical spine was performed following the standard protocol without intravenous contrast. Multiplanar CT image reconstructions of the cervical spine were also generated. COMPARISON:  Brain MRI 10/09/2020. FINDINGS: CT HEAD FINDINGS Brain: Mild-to-moderate generalized cerebral atrophy. Comparatively mild cerebellar atrophy. 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 Background  moderate/advanced patchy and ill-defined hypoattenuation within the cerebral white matter, nonspecific but compatible with chronic small vessel ischemic disease. There is no acute intracranial hemorrhage. No demarcated cortical infarct. No extra-axial fluid collection. No evidence of an intracranial mass. No midline shift. Vascular: No hyperdense vessel.  Atherosclerotic calcifications. Skull: Normal. Negative for fracture or focal lesion. Sinuses/Orbits: Visualized orbits show no acute finding. Mild mucosal thickening within the bilateral ethmoid and maxillary sinuses at the imaged levels. CT CERVICAL SPINE FINDINGS Alignment: Straightening of the expected cervical lordosis. No significant spondylolisthesis. Skull base and vertebrae: The basion-dental and atlanto-dental intervals are maintained.No evidence of acute fracture to the cervical spine. Soft tissues and spinal canal: No prevertebral fluid or swelling. No visible canal hematoma. Disc levels: Cervical spondylosis with multilevel disc space narrowing, disc bulges, posterior disc osteophytes, endplate spurring, uncovertebral hypertrophy and facet arthrosis. Disc space narrowing is greatest at C5-C6, C6-C7 and T2-T3 (moderate at these levels). No appreciable levels of severe spinal canal stenosis. Multilevel bony neural foraminal narrowing. Multilevel ventral osteophytes. Upper chest: No consolidation within the imaged lung apices. No visible pneumothorax. Centrilobular and paraseptal emphysema  with biapical bulla. Biapical pleuroparenchymal scarring. IMPRESSION: CT head: 1. No evidence of acute intracranial abnormality. 2. 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 3. Mild paranasal sinus disease at the imaged levels. CT cervical spine: 1. No evidence of acute fracture to the cervical spine. 2. Straightening of the expected cervical lordosis. 3.  Cervical spondylosis, as described. 4. Emphysema (ICD10-J43.9). Associated biapical pleuroparenchymal scarring and biapical bulla. Electronically Signed   By: Kellie Simmering D.O.   On: 12/26/2020 13:33   CT Chest W Contrast  Addendum Date: 12/27/2020   ADDENDUM REPORT: 12/27/2020 10:07 ADDENDUM: Upon reassessing imaging there is considerable artifact passing through the RIGHT lower chest and this study is venous phase. Despite images that show clear intraluminal low attenuation and high suspicion for pulmonary embolism there is question due to the artifact seen in this area. Furthermore, to help determine best future management in this complex patient would suggest a study performed in the appropriate phase, CT PE protocol with the arms up if possible. These results were called by telephone at the time of interpretation on 12/27/2020 at 10:07 am to provider Hosie Poisson MD, who verbally acknowledged these results. Electronically Signed   By: Zetta Bills M.D.   On: 12/27/2020 10:07   Result Date: 12/27/2020 CLINICAL DATA:  History of trauma, unwitnessed syncopal episode in an 80 year old male with history of pulmonary neoplasm. EXAM: CT CHEST WITH CONTRAST TECHNIQUE: Multidetector CT imaging of the chest was performed during intravenous contrast administration. CONTRAST:  74mL OMNIPAQUE IOHEXOL 350 MG/ML SOLN COMPARISON:  December 24, 2020 chest CT. FINDINGS: Cardiovascular: The aorta is normal caliber. Central pulmonary vasculature is normal caliber. Filling defects in the RIGHT lower lobe segmental and subsegmental branches to posterior basal segment (image 126/3) this is in an area of beam hardening artifact. The study is a venous phase evaluation not performed for PE assessment. No additional areas are demonstrated. Heart size is normal without pericardial effusion. There is straightening of the interventricular septum that was present previously. Mediastinum/Nodes: Interval decrease in size of proximally 2  cm LEFT paramediastinal mass contiguous with AP window soft tissue is stable in the short interval. Small lymph nodes throughout the remainder of the mediastinum likewise are unchanged. Lungs/Pleura: LEFT lower lobe nodularity and airspace process showing no change in the short interval. Signs of pulmonary emphysema and LEFT upper lobe nodule as discussed. Nodular changes have developed in the RIGHT lower lobe, multifocal nodularity since the previous study. Stable background subsolid nodule (image 119/6) 13 x 15 mm, this was not present in August of 2022 nor in September and is therefore favored to represent post inflammatory process. There is mild septal thickening in the RIGHT upper lobe that is new from recent imaging. Mild bronchial wall thickening is present throughout the chest. Upper Abdomen: Incidental imaging of upper abdominal contents without acute process. 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 No acute upper abdominal process. Musculoskeletal: Signs of subacute sternal fracture, sclerotic margins without change, no substantial displacement. Spinal degenerative changes. Costochondral elements are intact. No signs of displaced rib fracture. Visualized clavicles and scapulae are intact. IMPRESSION: Small RIGHT lower lobe pulmonary emboli. Straightening of the interventricular septum is a stable finding and may relate to baseline elevated RIGHT heart pressures. Echocardiographic correlation may be helpful as warranted. Worsening of multifocal pneumonia. Signs of primary lung  cancer in the LEFT chest with mediastinal involvement as described previously. 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 Sternal fracture favored to be subacute is new since September of 2022. Not substantially changed since December 24, 2020. Aortic Atherosclerosis (ICD10-I70.0) and Emphysema (ICD10-J43.9).  Critical Value/emergent results were called by telephone at the time of interpretation on 12/26/2020 at 1:42 pm to provider [facility] , who verbally acknowledged these results. Electronically Signed: By: Zetta Bills M.D. On: 12/26/2020 13:43   CT Angio Chest Pulmonary Embolism (PE) W or WO Contrast  Result Date: 12/27/2020 CLINICAL DATA:  PE suspected, high probability. Possible PE versus artifact identified incidentally on a CT scan of the chest with contrast from yesterday. EXAM: CT ANGIOGRAPHY CHEST WITH CONTRAST TECHNIQUE: Multidetector CT imaging of the chest was performed using the standard protocol during bolus administration of intravenous contrast. Multiplanar CT image reconstructions and MIPs were obtained to evaluate the vascular anatomy. CONTRAST:  63mL OMNIPAQUE IOHEXOL 350 MG/ML SOLN COMPARISON:  CT scan of the chest 12/26/2020 FINDINGS: Cardiovascular: Satisfactory opacification of the pulmonary arteries to the segmental level. No evidence of pulmonary embolism. Normal heart size. The previously questioned pulmonary emboli are not evident on today's exam and almost certainly represented focal beam hardening artifact. No pericardial effusion. Aortic and coronary artery atherosclerotic calcifications. Mediastinum/Nodes: Stable left paramediastinal mass measuring approximately 2 cm in contiguous with soft tissue in the AP window. Additionally, small scattered mediastinal lymph nodes are also unchanged compared to recent prior imaging. Lungs/Pleura: Continued stability of nonspecific left lower lobe nodularity and left upper lobe pulmonary nodule. Stable appearance of multifocal airspace opacifications in the right lower lobe. Background of moderately severe centrilobular pulmonary emphysema. Slightly increased right lower lobe atelectasis. No pleural effusion or pneumothorax. Overall, unchanged compared to yesterday. Upper Abdomen: No acute abnormality. Musculoskeletal: No acute abnormality.  Review of the MIP images confirms the above findings. IMPRESSION: 1. Dedicated CTA imaging confirms that the previously suspected right lower lobe segmental pulmonary emboli were in fact artifactual. 2. Otherwise, unchanged appearance of multifocal pneumonia and primary left chest lung cancer with mediastinal involvement. Aortic Atherosclerosis (ICD10-I70.0) and Emphysema (ICD10-J43.9). Electronically Signed   By: Jacqulynn Cadet M.D.   On: 12/27/2020 13:38   CT Angio Chest PE W and/or Wo Contrast  Result Date: 12/24/2020 CLINICAL DATA:  Difficulty breathing.  History of lung cancer. EXAM: CT ANGIOGRAPHY CHEST WITH CONTRAST TECHNIQUE: Multidetector CT imaging of the chest was performed using the standard protocol during bolus administration of intravenous contrast. Multiplanar CT image reconstructions and MIPs were obtained to evaluate the vascular anatomy. CONTRAST:  33mL OMNIPAQUE IOHEXOL 350 MG/ML SOLN COMPARISON:  October 15, 2020.  October 15, 2013. FINDINGS: Cardiovascular: Satisfactory opacification of the pulmonary arteries to the segmental level. No evidence of pulmonary embolism. Normal heart size. No pericardial effusion. Mild coronary artery calcifications are noted. Mediastinum/Nodes: Thyroid gland and esophagus are unremarkable. 4.7 x 2.2 cm adenopathy is noted in aortopulmonary window. Lungs/Pleura: Emphysematous disease is noted. No pneumothorax or pleural effusion is noted. Patchy airspace opacities are noted in the left lower lobe concerning for pneumonia. 2.2 x 2.0 cm mass is noted anteriorly in the left upper lobe which does not appear to be significantly changed compared to prior exam and is concerning for malignancy. Several small ill-defined opacities are noted in the right lower lobe which may represent focal inflammation. Upper Abdomen: No acute abnormality. Musculoskeletal: No chest wall abnormality. No acute or significant osseous findings. Review of the MIP images  confirms the  above findings. IMPRESSION: No definite evidence of pulmonary embolus. New patchy airspace opacities are noted in the left lower lobe and to a lesser degree in the right lower lobe, concerning for multifocal pneumonia. 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 Aortic Atherosclerosis (ICD10-I70.0) and Emphysema (ICD10-J43.9). Electronically Signed   By: Marijo Conception M.D.   On: 12/24/2020 16:50   CT Cervical Spine Wo Contrast  Result Date: 12/26/2020 CLINICAL DATA:  Neck trauma. Head trauma, minor. Additional history provided: Unwitnessed syncopal episode and fall. Patient reports left rib pain, difficulty taking deep breaths. EXAM: CT HEAD WITHOUT CONTRAST CT CERVICAL SPINE WITHOUT CONTRAST TECHNIQUE: Multidetector CT imaging of the head and cervical spine was performed following the standard protocol without intravenous contrast. Multiplanar CT image reconstructions of the cervical spine were also generated. COMPARISON:  Brain MRI 10/09/2020. FINDINGS: CT HEAD FINDINGS Brain: Mild-to-moderate generalized cerebral atrophy. Comparatively mild cerebellar atrophy. 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 Background moderate/advanced patchy and ill-defined hypoattenuation within the cerebral white matter, nonspecific but compatible with chronic small vessel ischemic disease. There is no acute intracranial hemorrhage. No demarcated cortical infarct. No extra-axial fluid collection. No evidence of an intracranial mass. No midline shift. Vascular: No hyperdense vessel.  Atherosclerotic calcifications. Skull: Normal. Negative for fracture or focal lesion. Sinuses/Orbits: Visualized orbits show no acute finding. Mild mucosal thickening within  the bilateral ethmoid and maxillary sinuses at the imaged levels. CT CERVICAL SPINE FINDINGS Alignment: Straightening of the expected cervical lordosis. No significant spondylolisthesis. Skull base and vertebrae: The basion-dental and atlanto-dental intervals are maintained.No evidence of acute fracture to the cervical spine. Soft tissues and spinal canal: No prevertebral fluid or swelling. No visible canal hematoma. Disc levels: Cervical spondylosis with multilevel disc space narrowing, disc bulges, posterior disc osteophytes, endplate spurring, uncovertebral hypertrophy and facet arthrosis. Disc space narrowing is greatest at C5-C6, C6-C7 and T2-T3 (moderate at these levels). No appreciable levels of severe spinal canal stenosis. Multilevel bony neural foraminal narrowing. Multilevel ventral osteophytes. Upper chest: No consolidation within the imaged lung apices. No visible pneumothorax. Centrilobular and paraseptal emphysema with biapical bulla. Biapical pleuroparenchymal scarring. IMPRESSION: CT head: 1. No evidence of acute intracranial abnormality. 2. 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 3. Mild paranasal sinus disease at the imaged levels. CT cervical spine: 1. No evidence of acute fracture to the cervical spine. 2. Straightening of the expected cervical lordosis. 3. Cervical spondylosis, as described. 4. Emphysema (ICD10-J43.9). Associated biapical pleuroparenchymal scarring and biapical bulla. Electronically Signed   By: Kellie Simmering D.O.   On: 12/26/2020 13:33   ECHOCARDIOGRAM COMPLETE  Result Date: 12/27/2020    ECHOCARDIOGRAM REPORT   Patient Name:   JOB HOLTSCLAW Date of Exam: 12/27/2020 Medical Rec #:  [id number]      Height:       70.0 in Accession #:    [id number]     Weight:       173.5 lb Date of Birth:  05/18/1940      BSA:          1.965 m Patient Age:    58 years  BP:            106/73 mmHg Patient Gender: M              HR:           81 bpm. Exam Location:  Inpatient Procedure: 2D Echo, Cardiac Doppler and Color Doppler Indications:    Pulmonary Embolus I26.09  History:        Patient has no prior history of Echocardiogram examinations.                 COPD; Risk Factors:Hypertension.  Sonographer:    Bernadene Person RDCS Referring Phys: [id number] [place]  1. Left ventricular ejection fraction, by estimation, is 40 to 45%. The left ventricle has mildly decreased function. The left ventricle demonstrates global hypokinesis. The left ventricular internal cavity size was mildly dilated. Left ventricular diastolic function could not be evaluated.  2. Right ventricular systolic function is normal. The right ventricular size is normal. There is normal pulmonary artery systolic pressure.  3. Left atrial size was mildly dilated.  4. Right atrial size was mildly dilated.  5. The mitral valve is normal in structure. Trivial mitral valve regurgitation.  6. The aortic valve is calcified. There is mild calcification of the aortic valve. There is mild thickening of the aortic valve. Aortic valve regurgitation is mild to moderate.  7. Aortic dilatation noted. There is borderline dilatation of the ascending aorta, measuring 39 mm. FINDINGS  Left Ventricle: Left ventricular ejection fraction, by estimation, is 40 to 45%. The left ventricle has mildly decreased function. The left ventricle demonstrates global hypokinesis. The left ventricular internal cavity size was mildly dilated. There is  no left ventricular hypertrophy. Left ventricular diastolic function could not be evaluated due to atrial fibrillation. Left ventricular diastolic function could not be evaluated. Right Ventricle: The right ventricular size is normal. Right vetricular wall thickness was not well visualized. Right ventricular systolic function is normal. There is normal pulmonary artery systolic pressure. The  tricuspid regurgitant velocity is 2.18 m/s, and with an assumed right atrial pressure of 3 mmHg, the estimated right ventricular systolic pressure is 65.7 mmHg. Left Atrium: Left atrial size was mildly dilated. Right Atrium: Right atrial size was mildly dilated. Pericardium: There is no evidence of pericardial effusion. Mitral Valve: The mitral valve is normal in structure. Trivial mitral valve regurgitation. Tricuspid Valve: The tricuspid valve is grossly normal. Tricuspid valve regurgitation is trivial. Aortic Valve: The aortic valve is calcified. There is mild calcification of the aortic valve. There is mild thickening of the aortic valve. There is mild aortic valve annular calcification. Aortic valve regurgitation is mild to moderate. Aortic regurgitation PHT measures 326 msec. Pulmonic Valve: The pulmonic valve was not well visualized. Pulmonic valve regurgitation is not visualized. Aorta: Aortic dilatation noted. There is borderline dilatation of the ascending aorta, measuring 39 mm. IAS/Shunts: The atrial septum is grossly normal.  LEFT VENTRICLE PLAX 2D LVIDd:         5.00 cm LVIDs:         4.20 cm LV PW:         1.00 cm LV IVS:        1.00 cm LVOT diam:     2.20 cm LV SV:         69 LV SV Index:   35 LVOT Area:     3.80 cm  LV Volumes (MOD) LV vol d, MOD A2C: 126.0 ml LV vol d, MOD A4C: 96.6  ml LV vol s, MOD A2C: 70.9 ml LV vol s, MOD A4C: 58.1 ml LV SV MOD A2C:     55.1 ml LV SV MOD A4C:     96.6 ml LV SV MOD BP:      49.7 ml RIGHT VENTRICLE TAPSE (M-mode): 1.7 cm LEFT ATRIUM             Index        RIGHT ATRIUM           Index LA diam:        4.60 cm 2.34 cm/m   RA Area:     19.10 cm LA Vol (A2C):   62.9 ml 32.01 ml/m  RA Volume:   56.70 ml  28.86 ml/m LA Vol (A4C):   57.7 ml 29.36 ml/m LA Biplane Vol: 66.3 ml 33.74 ml/m  AORTIC VALVE LVOT Vmax:   99.30 cm/s LVOT Vmean:  66.200 cm/s LVOT VTI:    0.182 m AI PHT:      326 msec  AORTA Ao Root diam: 3.90 cm Ao Asc diam:  3.90 cm TRICUSPID VALVE TR  Peak grad:   19.0 mmHg TR Vmax:        218.00 cm/s  SHUNTS Systemic VTI:  0.18 m Systemic Diam: 2.20 cm Mertie Moores MD Electronically signed by Mertie Moores MD Signature Date/Time: 12/27/2020/4:31:49 PM    Final    VAS [place] LOWER EXTREMITY VENOUS (DVT)  Result Date: 01/01/2021  Lower Venous DVT Study Patient Name:  KAENAN JAKE  Date of Exam:   01/01/2021 Medical Rec #: [id number]       Accession #:    [id number] Date of Birth: 30-Oct-1940       Patient Gender: M Patient Age:   49 years Exam Location:  [facility] Procedure:      VAS [place] LOWER EXTREMITY VENOUS (DVT) Referring Phys: ERIC [place] --------------------------------------------------------------------------------  Indications: Edema.  Risk Factors: Cancer. Comparison Study: No prior studies. Performing Technologist: Oliver Hum RVT  Examination Guidelines: A complete evaluation includes B-mode imaging, spectral Doppler, color Doppler, and power Doppler as needed of all accessible portions of each vessel. Bilateral testing is considered an integral part of a complete examination. Limited examinations for reoccurring indications may be performed as noted. The reflux portion of the exam is performed with the patient in reverse Trendelenburg.  +---------+---------------+---------+-----------+----------+--------------+  RIGHT     Compressibility Phasicity Spontaneity Properties Thrombus Aging  +---------+---------------+---------+-----------+----------+--------------+  CFV       Full            Yes       Yes                                    +---------+---------------+---------+-----------+----------+--------------+  SFJ       Full                                                             +---------+---------------+---------+-----------+----------+--------------+  FV Prox   Full                                                             +---------+---------------+---------+-----------+----------+--------------+  FV Mid    Full                                                              +---------+---------------+---------+-----------+----------+--------------+  FV Distal Full                                                             +---------+---------------+---------+-----------+----------+--------------+  PFV       Full                                                             +---------+---------------+---------+-----------+----------+--------------+  POP       Full            Yes       Yes                                    +---------+---------------+---------+-----------+----------+--------------+  PTV       Full                                                             +---------+---------------+---------+-----------+----------+--------------+  PERO      Full                                                             +---------+---------------+---------+-----------+----------+--------------+   +---------+---------------+---------+-----------+----------+--------------+  LEFT      Compressibility Phasicity Spontaneity Properties Thrombus Aging  +---------+---------------+---------+-----------+----------+--------------+  CFV       Full            Yes       Yes                                    +---------+---------------+---------+-----------+----------+--------------+  SFJ       Full                                                             +---------+---------------+---------+-----------+----------+--------------+  FV Prox   Full                                                             +---------+---------------+---------+-----------+----------+--------------+  FV Mid    Full                                                             +---------+---------------+---------+-----------+----------+--------------+  FV Distal Full                                                             +---------+---------------+---------+-----------+----------+--------------+  PFV       Full                                                              +---------+---------------+---------+-----------+----------+--------------+  POP       Full            Yes       Yes                                    +---------+---------------+---------+-----------+----------+--------------+  PTV       Full                                                             +---------+---------------+---------+-----------+----------+--------------+  PERO      Full                                                             +---------+---------------+---------+-----------+----------+--------------+     Summary: RIGHT: - There is no evidence of deep vein thrombosis in the lower extremity.  - No cystic structure found in the popliteal fossa.  LEFT: - There is no evidence of deep vein thrombosis in the lower extremity.  - No cystic structure found in the popliteal fossa.  *See table(s) above for measurements and observations. Electronically signed by Orlie Pollen on 01/01/2021 at 5:17:49 PM.    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 The patient started systemic chemotherapy with carboplatin for AUC of 5, Alimta 500 Mg/M2 and Keytruda 200 Mg IV every 3 weeks status post 2 cycles. The patient has been tolerating his treatment fairly well but he was admitted recently to the hospital with pneumonia and  pancytopenia. He had imaging studies performed during his hospitalization that showed no clear evidence for disease progression. I recommended for him to proceed with cycle #3 of his treatment today but I will reduce the dose of carboplatin to AUC of 4 and Alimta 400 Mg/M2 starting from cycle #3. I will see  him back for follow-up visit in 3 weeks for evaluation before the next cycle of his treatment. For the hypertension, the patient was advised to check his blood pressure regularly at home and not to take his blood pressure medication if it is very low. For the hypokalemia, I will arrange for the patient to receive potassium chloride 20 mill equivalent IV in the clinic today. He was advised to call immediately if he has any other concerning symptoms in the interval. The patient voices understanding of current disease status and treatment options and is in agreement with the current care plan.  All questions were answered. The patient knows to call the clinic with any problems, questions or concerns. We can certainly see the patient much sooner if necessary.   Disclaimer: This note was dictated with voice recognition software. Similar sounding words can inadvertently be transcribed and may not be corrected upon review.

## 2021-01-08 NOTE — Patient Instructions (Signed)
[facility]  Discharge Instructions: Thank you for choosing [facility] to provide your oncology and hematology care.   If you have a lab appointment with the [facility], please go directly to the [facility] and check in at the registration area.   Wear comfortable clothing and clothing appropriate for easy access to any Portacath or PICC line.   We strive to give you quality time with your provider. You may need to reschedule your appointment if you arrive late (15 or more minutes).  Arriving late affects you and other patients whose appointments are after yours.  Also, if you miss three or more appointments without notifying the office, you may be dismissed from the clinic at the providers discretion.      For prescription refill requests, have your pharmacy contact our office and allow 72 hours for refills to be completed.    Today you received the following chemotherapy and/or immunotherapy agents CARBOplatin PEMEtrexed, pembrolizumab    To help prevent nausea and vomiting after your treatment, we encourage you to take your nausea medication as directed.  BELOW ARE SYMPTOMS THAT SHOULD BE REPORTED IMMEDIATELY: *FEVER GREATER THAN 100.4 F (38 C) OR HIGHER *CHILLS OR SWEATING *NAUSEA AND VOMITING THAT IS NOT CONTROLLED WITH YOUR NAUSEA MEDICATION *UNUSUAL SHORTNESS OF BREATH *UNUSUAL BRUISING OR BLEEDING *URINARY PROBLEMS (pain or burning when urinating, or frequent urination) *BOWEL PROBLEMS (unusual diarrhea, constipation, pain near the anus) TENDERNESS IN MOUTH AND THROAT WITH OR WITHOUT PRESENCE OF ULCERS (sore throat, sores in mouth, or a toothache) UNUSUAL RASH, SWELLING OR PAIN  UNUSUAL VAGINAL DISCHARGE OR ITCHING   Items with * indicate a potential emergency and should be followed up as soon as possible or go to the Emergency Department if any problems should occur.  Please show the CHEMOTHERAPY ALERT CARD or IMMUNOTHERAPY  ALERT CARD at check-in to the Emergency Department and triage nurse.  Should you have questions after your visit or need to cancel or reschedule your appointment, please contact [facility]  Dept: [phone]  and follow the prompts.  Office hours are 8:00 a.m. to 4:30 p.m. Monday - Friday. Please note that voicemails left after 4:00 p.m. may not be returned until the following business day.  We are closed weekends and major holidays. You have access to a nurse at all times for urgent questions. Please call the main number to the clinic Dept: [phone] and follow the prompts.   For any non-urgent questions, you may also contact your provider using MyChart. We now offer e-Visits for anyone 78 and older to request care online for non-urgent symptoms. For details visit [email or web address].   Also download the MyChart app! Go to the app store, search "MyChart", open the app, select [facility], and log in with your MyChart username and password.  Due to Covid, a mask is required upon entering the hospital/clinic. If you do not have a mask, one will be given to you upon arrival. For doctor visits, patients may have 1 support person aged 62 or older with them. For treatment visits, patients cannot have anyone with them due to current Covid guidelines and our immunocompromised population.

## 2021-01-08 NOTE — Progress Notes (Signed)
Patient tolerated CARBOplatin, PEMEtrexed and pembrolizumab infusion well today. Upon discharge BP 89/57 P82. Patient's baseline looks to be low. MD notified, advised to hold BP meds today and increase fluid intake at home. Patient educated and verbalizes understanding. Patient is aware to check BP at home and prevent falls. Discharged.

## 2021-01-11 ENCOUNTER — Encounter (HOSPITAL_COMMUNITY): Payer: Self-pay

## 2021-01-11 ENCOUNTER — Other Ambulatory Visit: Payer: Self-pay

## 2021-01-11 ENCOUNTER — Emergency Department (HOSPITAL_COMMUNITY): Payer: Medicare Other

## 2021-01-11 ENCOUNTER — Observation Stay (HOSPITAL_COMMUNITY)
Admission: EM | Admit: 2021-01-11 | Discharge: 2021-01-12 | Disposition: A | Discharge status: Home / Self Care | Payer: Medicare Other | Attending: Internal Medicine | Admitting: Internal Medicine

## 2021-01-11 DIAGNOSIS — J9601 Acute respiratory failure with hypoxia: Principal | ICD-10-CM | POA: Insufficient documentation

## 2021-01-11 DIAGNOSIS — I480 Paroxysmal atrial fibrillation: Secondary | ICD-10-CM | POA: Insufficient documentation

## 2021-01-11 DIAGNOSIS — Z8546 Personal history of malignant neoplasm of prostate: Secondary | ICD-10-CM | POA: Diagnosis not present

## 2021-01-11 DIAGNOSIS — I5022 Chronic systolic (congestive) heart failure: Secondary | ICD-10-CM | POA: Diagnosis not present

## 2021-01-11 DIAGNOSIS — Z85118 Personal history of other malignant neoplasm of bronchus and lung: Secondary | ICD-10-CM | POA: Diagnosis not present

## 2021-01-11 DIAGNOSIS — I110 Hypertensive heart disease with heart failure: Secondary | ICD-10-CM | POA: Diagnosis not present

## 2021-01-11 DIAGNOSIS — Z20822 Contact with and (suspected) exposure to covid-19: Secondary | ICD-10-CM | POA: Diagnosis not present

## 2021-01-11 DIAGNOSIS — R531 Weakness: Secondary | ICD-10-CM | POA: Diagnosis not present

## 2021-01-11 DIAGNOSIS — Z7901 Long term (current) use of anticoagulants: Secondary | ICD-10-CM | POA: Insufficient documentation

## 2021-01-11 DIAGNOSIS — J189 Pneumonia, unspecified organism: Secondary | ICD-10-CM | POA: Diagnosis not present

## 2021-01-11 DIAGNOSIS — F17210 Nicotine dependence, cigarettes, uncomplicated: Secondary | ICD-10-CM | POA: Diagnosis not present

## 2021-01-11 DIAGNOSIS — I4891 Unspecified atrial fibrillation: Secondary | ICD-10-CM | POA: Diagnosis not present

## 2021-01-11 DIAGNOSIS — R0902 Hypoxemia: Secondary | ICD-10-CM | POA: Diagnosis not present

## 2021-01-11 DIAGNOSIS — J449 Chronic obstructive pulmonary disease, unspecified: Secondary | ICD-10-CM | POA: Insufficient documentation

## 2021-01-11 LAB — COMPREHENSIVE METABOLIC PANEL
ALT: 38 U/L (ref 0–44)
AST: 40 U/L (ref 15–41)
Albumin: 2.8 g/dL — ABNORMAL LOW (ref 3.5–5.0)
Alkaline Phosphatase: 98 U/L (ref 38–126)
Anion gap: 6 (ref 5–15)
BUN: 30 mg/dL — ABNORMAL HIGH (ref 8–23)
CO2: 30 mmol/L (ref 22–32)
Calcium: 8.6 mg/dL — ABNORMAL LOW (ref 8.9–10.3)
Chloride: 101 mmol/L (ref 98–111)
Creatinine, Ser: 1.28 mg/dL — ABNORMAL HIGH (ref 0.61–1.24)
GFR, Estimated: 57 mL/min — ABNORMAL LOW (ref >60)
Glucose, Bld: 196 mg/dL — ABNORMAL HIGH (ref 70–99)
Potassium: 4.2 mmol/L (ref 3.5–5.1)
Sodium: 137 mmol/L (ref 135–145)
Total Bilirubin: 1.3 mg/dL — ABNORMAL HIGH (ref 0.3–1.2)
Total Protein: 5.6 g/dL — ABNORMAL LOW (ref 6.5–8.1)

## 2021-01-11 LAB — TROPONIN I (HIGH SENSITIVITY)
Troponin I (High Sensitivity): 11 ng/L (ref <18)
Troponin I (High Sensitivity): 11 ng/L (ref <18)

## 2021-01-11 LAB — LIPASE, BLOOD: Lipase: 29 U/L (ref 11–51)

## 2021-01-11 LAB — CBC WITH DIFFERENTIAL/PLATELET
Abs Immature Granulocytes: 0 10*3/uL (ref 0.00–0.07)
Basophils Absolute: 0.1 10*3/uL (ref 0.0–0.1)
Basophils Relative: 1 %
Eosinophils Absolute: 0 10*3/uL (ref 0.0–0.5)
Eosinophils Relative: 0 %
HCT: 27.2 % — ABNORMAL LOW (ref 39.0–52.0)
Hemoglobin: 9 g/dL — ABNORMAL LOW (ref 13.0–17.0)
Lymphocytes Relative: 3 %
Lymphs Abs: 0.2 10*3/uL — ABNORMAL LOW (ref 0.7–4.0)
MCH: 34 pg (ref 26.0–34.0)
MCHC: 33.1 g/dL (ref 30.0–36.0)
MCV: 102.6 fL — ABNORMAL HIGH (ref 80.0–100.0)
Monocytes Absolute: 0 10*3/uL — ABNORMAL LOW (ref 0.1–1.0)
Monocytes Relative: 0 %
Neutro Abs: 5.5 10*3/uL (ref 1.7–7.7)
Neutrophils Relative %: 96 %
Platelets: 239 10*3/uL (ref 150–400)
RBC: 2.65 MIL/uL — ABNORMAL LOW (ref 4.22–5.81)
RDW: 20.5 % — ABNORMAL HIGH (ref 11.5–15.5)
WBC: 5.7 10*3/uL (ref 4.0–10.5)
nRBC: 0 /100 WBC
nRBC: 0.4 % — ABNORMAL HIGH (ref 0.0–0.2)

## 2021-01-11 LAB — URINALYSIS, ROUTINE W REFLEX MICROSCOPIC
Bilirubin Urine: NEGATIVE
Glucose, UA: NEGATIVE mg/dL
Hgb urine dipstick: NEGATIVE
Ketones, ur: NEGATIVE mg/dL
Leukocytes,Ua: NEGATIVE
Nitrite: NEGATIVE
Protein, ur: NEGATIVE mg/dL
Specific Gravity, Urine: 1.01 (ref 1.005–1.030)
pH: 6.5 (ref 5.0–8.0)

## 2021-01-11 LAB — MAGNESIUM: Magnesium: 2 mg/dL (ref 1.7–2.4)

## 2021-01-11 LAB — BRAIN NATRIURETIC PEPTIDE: B Natriuretic Peptide: 270.6 pg/mL — ABNORMAL HIGH (ref 0.0–100.0)

## 2021-01-11 LAB — LACTIC ACID, PLASMA
Lactic Acid, Venous: 2.2 mmol/L (ref 0.5–1.9)
Lactic Acid, Venous: 1.3 mmol/L (ref 0.5–1.9)

## 2021-01-11 LAB — CORTISOL: Cortisol, Plasma: 18.1 ug/dL

## 2021-01-11 LAB — RESP PANEL BY RT-PCR (FLU A&B, COVID) ARPGX2
Influenza A by PCR: NEGATIVE
Influenza B by PCR: NEGATIVE
SARS Coronavirus 2 by RT PCR: NEGATIVE

## 2021-01-11 LAB — TSH: TSH: 1.218 u[IU]/mL (ref 0.350–4.500)

## 2021-01-11 MED ORDER — METOPROLOL TARTRATE 25 MG PO TABS: 25.0000 mg | ORAL_TABLET | Freq: Two times a day (BID) | ORAL | Status: DC

## 2021-01-11 MED ORDER — LACTATED RINGERS IV SOLN: INTRAVENOUS | Status: DC

## 2021-01-11 MED ORDER — VANCOMYCIN HCL 1500 MG/300ML IV SOLN
1500.0000 mg | Freq: Once | INTRAVENOUS | Status: AC
  Administered 2021-01-11: 12:00:00 1500 mg via INTRAVENOUS
  Filled 2021-01-11: qty 300

## 2021-01-11 MED ORDER — PROCHLORPERAZINE MALEATE 5 MG PO TABS: 10.0000 mg | ORAL_TABLET | Freq: Four times a day (QID) | ORAL | Status: DC | PRN

## 2021-01-11 MED ORDER — ALBUTEROL SULFATE (2.5 MG/3ML) 0.083% IN NEBU: 3.0000 mL | INHALATION_SOLUTION | RESPIRATORY_TRACT | Status: DC | PRN

## 2021-01-11 MED ORDER — SODIUM CHLORIDE 0.9 % IV BOLUS
500.0000 mL | Freq: Once | INTRAVENOUS | Status: AC
  Administered 2021-01-11: 10:00:00 500 mL via INTRAVENOUS

## 2021-01-11 MED ORDER — ENSURE ENLIVE PO LIQD
237.0000 mL | Freq: Three times a day (TID) | ORAL | Status: DC
  Administered 2021-01-11: 17:00:00 237 mL via ORAL
  Filled 2021-01-11: qty 237

## 2021-01-11 MED ORDER — PANTOPRAZOLE SODIUM 40 MG PO TBEC
40.0000 mg | DELAYED_RELEASE_TABLET | Freq: Every day | ORAL | Status: DC
  Filled 2021-01-11 (×2): qty 1

## 2021-01-11 MED ORDER — BOOST HIGH PROTEIN PO LIQD
1.0000 | Freq: Two times a day (BID) | ORAL | Status: DC
  Administered 2021-01-11: 21:00:00 237 mL via ORAL
  Filled 2021-01-11 (×2): qty 237

## 2021-01-11 MED ORDER — ENOXAPARIN SODIUM 40 MG/0.4ML IJ SOSY: 40.0000 mg | PREFILLED_SYRINGE | INTRAMUSCULAR | Status: DC

## 2021-01-11 MED ORDER — VANCOMYCIN HCL 1250 MG/250ML IV SOLN
1250.0000 mg | INTRAVENOUS | Status: DC
  Administered 2021-01-12: 12:00:00 1250 mg via INTRAVENOUS
  Filled 2021-01-11 (×2): qty 250

## 2021-01-11 MED ORDER — SODIUM CHLORIDE 0.9 % IV SOLN
1.0000 g | INTRAVENOUS | Status: DC
  Administered 2021-01-12: 09:00:00 1 g via INTRAVENOUS
  Filled 2021-01-11: qty 10

## 2021-01-11 MED ORDER — DM-GUAIFENESIN ER 30-600 MG PO TB12
1.0000 | ORAL_TABLET | Freq: Two times a day (BID) | ORAL | Status: DC | PRN
  Filled 2021-01-11: qty 1

## 2021-01-11 MED ORDER — BOOST PLUS PO LIQD
237.0000 mL | Freq: Two times a day (BID) | ORAL | Status: DC
  Administered 2021-01-11: 16:00:00 237 mL via ORAL
  Filled 2021-01-11: qty 237

## 2021-01-11 MED ORDER — FUROSEMIDE 20 MG PO TABS: 40.0000 mg | ORAL_TABLET | Freq: Every day | ORAL | Status: DC

## 2021-01-11 MED ORDER — METOPROLOL TARTRATE 5 MG/5ML IV SOLN: 2.5000 mg | Freq: Four times a day (QID) | INTRAVENOUS | Status: DC | PRN

## 2021-01-11 MED ORDER — FOLIC ACID 1 MG PO TABS
1.0000 mg | ORAL_TABLET | Freq: Every day | ORAL | Status: DC
  Administered 2021-01-12: 09:00:00 1 mg via ORAL
  Filled 2021-01-11: qty 1

## 2021-01-11 MED ORDER — SODIUM CHLORIDE 0.9 % IV SOLN
2.0000 g | Freq: Once | INTRAVENOUS | Status: AC
  Administered 2021-01-11: 11:00:00 2 g via INTRAVENOUS
  Filled 2021-01-11: qty 20

## 2021-01-11 MED ORDER — DIGOXIN 0.25 MG/ML IJ SOLN
0.1250 mg | Freq: Four times a day (QID) | INTRAMUSCULAR | Status: AC
  Administered 2021-01-11 – 2021-01-12 (×4): 0.125 mg via INTRAVENOUS
  Filled 2021-01-11 (×4): qty 2

## 2021-01-11 MED ORDER — LACTATED RINGERS IV SOLN: INTRAVENOUS | Status: AC

## 2021-01-11 NOTE — Progress Notes (Signed)
Pharmacy Antibiotic Note  Joe Hill is a 80 y.o. male admitted on 01/11/2021 with pneumonia.  Pharmacy has been consulted for vancomycin dosing.  Patient with a history of Stage IV NSCLC, metestatic disease, COPD, and PAF on Eliquis. Last chemotherapy session 12/15. Patient presenting with weakness.  SCr 1.28 - up slightly from baseline WBC 5.7; LA 2.2; T 97.5 F  Plan: Ceftriaxone per MD Vancomycin 1500 mg once then 1250 mg q24hr (eAUC 491.7) unless change in renal function Trend WBC, Fever, Renal function, & Clinical course F/u cultures, clinical course, WBC, fever De-escalate when able Levels at steady state  Height: 5' 10.5" (179.1 cm) Weight: 79.4 kg (175 lb) IBW/kg (Calculated) : 74.15  Temp (24hrs), Avg:97.5 F (36.4 C), Min:97.5 F (36.4 C), Max:97.5 F (36.4 C)  Recent Labs  Lab 01/08/21 0837 01/11/21 0945  WBC 6.8 5.7  CREATININE 1.12 1.28*  LATICACIDVEN  --  2.2*    Estimated Creatinine Clearance: 48.3 mL/min (A) (by C-G formula based on SCr of 1.28 mg/dL (H)).    Allergies  Allergen Reactions   Penicillins Hives    Antimicrobials this admission: ceftriaxone 12/17 >>  vancomycin 12/17 >>   Microbiology results: Pending  Thank you for allowing pharmacy to be a part of this patients care.  Lorelei Pont, PharmD, BCPS 01/11/2021 11:15 AM ED Clinical Pharmacist -  [phone]

## 2021-01-11 NOTE — ED Notes (Signed)
EKG shown to Dr.Long.

## 2021-01-11 NOTE — ED Provider Notes (Signed)
Emergency Department Provider Note   I have reviewed the triage vital signs and the nursing notes.   HISTORY  Chief Complaint Weakness   HPI Joe Hill is a 80 y.o. male with PMH of Stage IV NSCLC, metestatic disease, COPD, and PAF on Eliquis presents to the emergency department with generalized weakness.  Patient tells me that he has been feeling especially fatigued and weak over the past 48 hours.  He lives at home alone.  He has not had any syncope events or chest pain.  He is not feeling particularly short of breath.  He does not recall any fevers or shaking chills.  His last chemotherapy session was Wednesday (Cycle #3 Carboplatin and Alimta).  The cycle dose was reduced with complaint of generalized weakness on the 15th according to Dr. Worthy Flank note.  Patient states he has been trying to drink fluids and eat but his appetite has been poor.  He has been drinking Ensure. He does not use assist devices at home.   He was discharged from the hospital on 12/8 with syncope at that time in the setting of multifocal pneumonia and pancytopenia.  He received blood and platelet transfusions along with antibiotics and Granix.   EMS arrived for transport. Fire had placed patient on J[place] 02 prior to their arrival.   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   TOBACCO USE 01/30/2007    Patient Active Problem List   Diagnosis Date Noted   PNA (pneumonia) 01/11/2021   Hypotension 01/02/2021   Chemotherapy-induced neutropenia (HCC)    Syncope and collapse 12/26/2020   Pancytopenia ([place]) 12/26/2020   Hypokalemia 12/26/2020   Borderline prolonged QT interval 12/26/2020   Pneumonia 12/26/2020   Pulmonary embolism ([place]) 12/26/2020    Atrial fibrillation with RVR ([place]) 12/26/2020   Encounter for antineoplastic chemotherapy 11/18/2020   Encounter for antineoplastic immunotherapy 11/18/2020   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   Aortic atherosclerosis ([place]) 10/28/2020   Dyslipidemia 10/23/2016   Abnormal CT scan, chest 09/07/2010   EMPHYSEMA 10/16/2009   GERD 09/09/2009   Hyperglycemia 08/12/2009   Essential hypertension 08/04/2007   TOBACCO USE 01/30/2007   PROSTATE CANCER, 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/11/2006    Past Surgical History:  Procedure Laterality Date   APPENDECTOMY     CATARACT EXTRACTION     PROSTATE SURGERY     prostatectomy   VIDEO BRONCHOSCOPY WITH ENDOBRONCHIAL NAVIGATION N/A 10/30/2020   Procedure: VIDEO BRONCHOSCOPY WITH ENDOBRONCHIAL NAVIGATION;  Surgeon: Melrose Nakayama, MD;  Location: [facility];  Service: Thoracic;  Laterality: N/A;   VIDEO BRONCHOSCOPY WITH ENDOBRONCHIAL ULTRASOUND N/A 10/30/2020   Procedure: VIDEO BRONCHOSCOPY WITH ENDOBRONCHIAL ULTRASOUND;  Surgeon: Melrose Nakayama, MD;  Location: [facility];  Service: Thoracic;  Laterality: N/A;    Allergies Penicillins  Family History  Problem Relation Age of Onset   Hyperlipidemia Mother    Cancer Father        lung, smoker   Cancer Sister        breast, smoker   Arthritis Maternal Aunt     Social History Social History   Tobacco Use   Smoking status: Every Day    Packs/day: 0.25    Types: Cigarettes  Smokeless tobacco: Never  Vaping Use   Vaping Use: Never used  Substance Use Topics   Alcohol use: No    Alcohol/week: 0.0 standard drinks   Drug use: No    Review of Systems  Constitutional: No fever/chills. Positive generalized weakness.  Eyes: No visual changes. ENT: No sore throat. Cardiovascular: Denies chest pain. Respiratory: Denies shortness of breath. Gastrointestinal: No abdominal pain.  No nausea, no vomiting.  No diarrhea.  No constipation. Genitourinary: Negative for  dysuria. Musculoskeletal: Negative for back pain. Skin: Negative for rash. Neurological: Negative for headaches, focal weakness or numbness.  10-point ROS otherwise negative.  ____________________________________________   PHYSICAL EXAM:  VITAL SIGNS: ED Triage Vitals  Enc Vitals Group     BP 01/11/21 0908 (!) 79/61     Pulse Rate 01/11/21 0908 (!) 101     Resp 01/11/21 0908 20     Temp 01/11/21 0908 (!) 97.5 F (36.4 C)     Temp Source 01/11/21 0908 Oral     SpO2 01/11/21 0908 100 %     Weight 01/11/21 0905 175 lb (79.4 kg)     Height 01/11/21 0905 5' 10.5" (1.791 m)    Constitutional: Alert and oriented. Well appearing and in no acute distress. Eyes: Conjunctivae are normal.  Head: Atraumatic. Nose: No congestion/rhinnorhea. Mouth/Throat: Mucous membranes are dry.   Neck: No stridor.   Cardiovascular: A-fib. Good peripheral circulation. Grossly normal heart sounds.   Respiratory: Normal respiratory effort.  No retractions. Lungs CTAB. Gastrointestinal: Soft and nontender. No distention.  Musculoskeletal: No lower extremity tenderness nor edema. No gross deformities of extremities. Neurologic:  Normal speech and language. No gross focal neurologic deficits are appreciated.  Skin:  Skin is warm, dry and intact. No rash noted.   ____________________________________________   LABS (all labs ordered are listed, but only abnormal results are displayed)  Labs Reviewed  LACTIC ACID, PLASMA - Abnormal; Notable for the following components:      Result Value   Lactic Acid, Venous 2.2 (*)    All other components within normal limits  COMPREHENSIVE METABOLIC PANEL - Abnormal; Notable for the following components:   Glucose, Bld 196 (*)    BUN 30 (*)    Creatinine, Ser 1.28 (*)    Calcium 8.6 (*)    Total Protein 5.6 (*)    Albumin 2.8 (*)    Total Bilirubin 1.3 (*)    GFR, Estimated 57 (*)    All other components within normal limits  CBC WITH DIFFERENTIAL/PLATELET -  Abnormal; Notable for the following components:   RBC 2.65 (*)    Hemoglobin 9.0 (*)    HCT 27.2 (*)    MCV 102.6 (*)    RDW 20.5 (*)    nRBC 0.4 (*)    Lymphs Abs 0.2 (*)    Monocytes Absolute 0.0 (*)    All other components within normal limits  BRAIN NATRIURETIC PEPTIDE - Abnormal; Notable for the following components:   B Natriuretic Peptide 270.6 (*)    All other components within normal limits  BASIC METABOLIC PANEL - Abnormal; Notable for the following components:   Glucose, Bld 114 (*)    BUN 24 (*)    Calcium 8.6 (*)    All other components within normal limits  CBC - Abnormal; Notable for the following components:   RBC 2.66 (*)    Hemoglobin 9.1 (*)    HCT 26.6 (*)    MCH 34.2 (*)    RDW 19.8 (*)  All other components within normal limits  CULTURE, BLOOD (ROUTINE X 2)  CULTURE, BLOOD (ROUTINE X 2)  URINE CULTURE  RESP PANEL BY RT-PCR (FLU A&B, COVID) ARPGX2  MRSA NEXT GEN BY PCR, NASAL  LACTIC ACID, PLASMA  LIPASE, BLOOD  MAGNESIUM  URINALYSIS, ROUTINE W REFLEX MICROSCOPIC  TSH  CORTISOL  TROPONIN I (HIGH SENSITIVITY)  TROPONIN I (HIGH SENSITIVITY)   ____________________________________________  EKG  EKG reviewed. Rate: 133. A-fib. Nonspecific ST changes. Similar to prior.    ____________________________________________  RADIOLOGY  CXR reviewed.   ____________________________________________   PROCEDURES  Procedure(s) performed:   Procedures  CRITICAL CARE Performed by: Margette Fast Total critical care time: 35 minutes Critical care time was exclusive of separately billable procedures and treating other patients. Critical care was necessary to treat or prevent imminent or life-threatening deterioration. Critical care was time spent personally by me on the following activities: development of treatment plan with patient and/or surrogate as well as nursing, discussions with consultants, evaluation of patient's response to treatment,  examination of patient, obtaining history from patient or surrogate, ordering and performing treatments and interventions, ordering and review of laboratory studies, ordering and review of radiographic studies, pulse oximetry and re-evaluation of patient's condition.  Nanda Quinton, MD Emergency Medicine  ____________________________________________   INITIAL IMPRESSION / ASSESSMENT AND PLAN / ED COURSE  Pertinent labs & imaging results that were available during my care of the patient were reviewed by me and considered in my medical decision making (see chart for details).   Patient presents to the emergency department generalized weakness in the setting of known non-small cell lung cancer with metastatic disease, COPD, A. Fib.  Recent admission earlier this month for multifocal pneumonia and pancytopenia.  Has had his third cycle of chemotherapy on the 15th of this month.  Will need screening blood work including cultures with his chemotherapy status.  Will assess for possible pancytopenia, return of pneumonia.  Patient's lower extremities have pitting edema bilaterally.  Consideration of cardiomyopathy/new CHF in the setting.  Have added a BNP but overall patient appears dehydrated with dry mucous membranes and poor skin turgor.  Will give gentle IV fluid bolus and reassess.  He does not have significant increased work of breathing.  PE is lower on my differential.  Will reassess after labs, COVID/flu swab, chest x-ray and wean oxygen as he tolerates.  Labs with mild elevated lactate. Persistent infiltrate noted. Plan for continued IVF and will cover with abx. Mild O2 requirement here. Doubt PE clinically.   Discussed patient's case with [facility] to request admission. Patient and family (if present) updated with plan. Care transferred to [facility] service.  I reviewed all nursing notes, vitals, pertinent old records, EKGs, labs, imaging (as available).  ____________________________________________  FINAL  CLINICAL IMPRESSION(S) / ED DIAGNOSES  Final diagnoses:  Acute respiratory failure with hypoxia ([place])     MEDICATIONS GIVEN DURING THIS VISIT:  Medications  lactated ringers infusion (0 mLs Intravenous Stopped 01/12/21 0130)  sodium chloride 0.9 % bolus 500 mL (0 mLs Intravenous Stopped 01/11/21 1110)  vancomycin (VANCOREADY) IVPB 1500 mg/300 mL (0 mg Intravenous Stopped 01/11/21 1420)  cefTRIAXone (ROCEPHIN) 2 g in sodium chloride 0.9 % 100 mL IVPB (0 g Intravenous Stopped 01/11/21 1224)  digoxin (LANOXIN) 0.25 MG/ML injection 0.125 mg (0.125 mg Intravenous Given 01/12/21 0919)     NEW OUTPATIENT MEDICATIONS STARTED DURING THIS VISIT:  Discharge Medication List as of 01/12/2021  3:42 PM     START taking these medications  Details  azithromycin (ZITHROMAX) 500 MG tablet Take 1 tablet (500 mg total) by mouth daily for 3 days. Take 1 tablet daily for 3 days., Starting Mon 01/12/2021, Until Thu 01/15/2021, Normal    cefdinir (OMNICEF) 300 MG capsule Take 1 capsule (300 mg total) by mouth 2 (two) times daily for 7 days., Starting Mon 01/12/2021, Until Mon 01/19/2021, Normal        Note:  This document was prepared using Dragon voice recognition software and may include unintentional dictation errors.  Nanda Quinton, MD, [facility] Emergency Medicine    Cathern Tahir, [facility], MD 01/13/21 [phone]

## 2021-01-11 NOTE — ED Triage Notes (Signed)
BIB EMS complaining of weakness and sob.  Fire placed patient on 2L [place].  NAD while triaging. Hx of Lung CA and Afib.

## 2021-01-11 NOTE — Progress Notes (Signed)
SLP Cancellation Note  Patient Details Name: Joe Hill MRN: [id number] DOB: 06/21/1940   Cancelled treatment:       Reason Eval/Treat Not Completed: Patient declined, no reason specified (Pt approached for swallow evaluation. The purpose of the evaluation was explained to the pt as well as MD's concerns. However, pt stated that he is swallowing fine and stated that would like the evaluation to be cancelled. Pt was advised that if he changes his mind later the order can be placed again. SLP will sign off per pt's request.)  Jayd Forrey I. Hardin Negus, [place], [place] Office number [phone] Pager [phone]  Horton Marshall 01/11/2021, 3:52 PM

## 2021-01-11 NOTE — ED Notes (Addendum)
Pt refused MRSA swab. MD notified.

## 2021-01-11 NOTE — H&P (Signed)
History and Physical    Joe Hill ALP:[id number] DOB: 04-19-1940 DOA: 01/11/2021  PCP: [name], MD (Confirm with patient/family/NH records and if not entered, this has to be entered at [facility] point of entry) Patient coming from: Home  I have personally briefly reviewed patient's old medical records in [place]  Chief Complaint: SOB  HPI: Joe Hill is a 80 y.o. 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  Patient was recently hospitalized for LLL pneumonia and A. fib, he was discharged on p.o. antibiotics.  He completed p.o. antibiotics about 1 week ago and has had he has been feeling well.  He had another episode of chemotherapy 4 days ago, and has been feeling generalized weakness since.  Last night, he started to have increasing shortness of breath, no chest pain no cough, no fever or chills.  He denied any history of aspiration pneumonia, denied any choking or cough after eating or drinking.  ED Course: Patient was found to be in rapid A. fib, borderline hypothermia, WBC 5.2, creatinine 1.2 BUN 30 compared to baseline 18 lactic acid 2.2.  Patient was given IV antibiotics vancomycin and ceftriaxone.  And started on LR at 150 mL/h.  Checks x-ray showed persistent LLL infiltrate concerning for pneumonia.  EKG uncontrolled A. fib  Review of Systems: As per HPI otherwise 14 point review of systems negative.    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   TOBACCO  USE 01/30/2007    Past Surgical History:  Procedure Laterality Date   APPENDECTOMY     CATARACT EXTRACTION     PROSTATE SURGERY     prostatectomy   VIDEO BRONCHOSCOPY WITH ENDOBRONCHIAL NAVIGATION N/A 10/30/2020   Procedure: VIDEO BRONCHOSCOPY WITH ENDOBRONCHIAL NAVIGATION;  Surgeon: Melrose Nakayama, MD;  Location: [facility];  Service: Thoracic;  Laterality: N/A;   VIDEO BRONCHOSCOPY WITH ENDOBRONCHIAL ULTRASOUND N/A 10/30/2020   Procedure: VIDEO BRONCHOSCOPY WITH ENDOBRONCHIAL ULTRASOUND;  Surgeon: Melrose Nakayama, MD;  Location: [facility];  Service: Thoracic;  Laterality: N/A;     reports that he has been smoking cigarettes. He has been smoking an average of .25 packs per day. He has never used smokeless tobacco. He reports that he does not drink alcohol and does not use drugs.  Allergies  Allergen Reactions   Penicillins Hives    Family History  Problem Relation Age of Onset   Hyperlipidemia Mother    Cancer Father        lung, smoker   Cancer Sister        breast, smoker   Arthritis Maternal Aunt      Prior to Admission medications   Medication Sig Start Date End Date Taking? Authorizing Provider  ADVIL 200 MG CAPS Take 200 mg by mouth every 6 (six) hours as needed (for mild pain or headaches).    [provider]  albuterol (VENTOLIN HFA) 108 (90 Base) MCG/ACT inhaler TAKE 2 PUFFS BY MOUTH EVERY 6 HOURS AS NEEDED FOR WHEEZE OR SHORTNESS OF BREATH 01/07/21   Hunter,  Brayton Mars, MD  dextromethorphan-guaiFENesin [facility] DM) 30-600 MG 12hr tablet Take 1 tablet by mouth 2 (two) times daily as needed for cough.    [provider]  diltiazem (CARDIZEM CD) 240 MG 24 hr capsule Take 1 capsule (240 mg total) by mouth daily. 01/02/21 04/02/21  [place] Donnamarie Poag, DO  feeding supplement (BOOST HIGH PROTEIN) LIQD Take 1 Container by mouth 2 (two) times daily.    [provider]  folic acid (FOLVITE) 1 MG tablet Take 1 tablet (1 mg total) by mouth daily. 11/18/20    Curt Bears, MD  furosemide (LASIX) 40 MG tablet Take 1 tablet (40 mg total) by mouth daily for 7 days. 01/02/21 01/09/21  [place] Eric J, DO  Nutritional Supplements (ENSURE COMPLETE PO) Take 237 mLs by mouth See admin instructions. Drink 237 ml's by mouth one to two times a day    [provider]  pantoprazole (PROTONIX) 40 MG tablet Take 1 tablet (40 mg total) by mouth daily. 01/02/21 04/02/21  [place] Donnamarie Poag, DO  potassium chloride SA (KLOR-CON) 20 MEQ tablet Take 1 tablet (20 mEq total) by mouth daily. Take 2 tablets (26meq) for 2 days then take 1 tablet (32meq) daily Patient taking differently: Take 20 mEq by mouth daily. 11/17/20   Shirley Friar, PA-C  prochlorperazine (COMPAZINE) 10 MG tablet Take 1 tablet (10 mg total) by mouth every 6 (six) hours as needed for nausea or vomiting. 11/18/20   Curt Bears, MD    Physical Exam: Vitals:   01/11/21 1100 01/11/21 1130 01/11/21 1200 01/11/21 1230  BP: 109/67 104/66 109/70 116/64  Pulse: 88 91 98 88  Resp: (!) 28 (!) 22 (!) 27 12  Temp:      TempSrc:      SpO2: 100% 99% 100% 100%  Weight:      Height:        Constitutional: NAD, calm, comfortable Vitals:   01/11/21 1100 01/11/21 1130 01/11/21 1200 01/11/21 1230  BP: 109/67 104/66 109/70 116/64  Pulse: 88 91 98 88  Resp: (!) 28 (!) 22 (!) 27 12  Temp:      TempSrc:      SpO2: 100% 99% 100% 100%  Weight:      Height:       Eyes: PERRL, lids and conjunctivae normal ENMT: Mucous membranes are moist. Posterior pharynx clear of any exudate or lesions.Normal dentition.  Neck: normal, supple, no masses, no thyromegaly Respiratory: clear to auscultation bilaterally, no wheezing, fine crackles on left lower field. Increasing respiratory effort. No accessory muscle use.  Cardiovascular: Irregular heart rate, no murmurs / rubs / gallops. 2+ extremity edema. 2+ pedal pulses. No carotid bruits.  Abdomen: no tenderness, no masses palpated. No hepatosplenomegaly.  Bowel sounds positive.  Musculoskeletal: no clubbing / cyanosis. No joint deformity upper and lower extremities. Good ROM, no contractures. Normal muscle tone.  Skin: no rashes, lesions, ulcers. No induration Neurologic: CN 2-12 grossly intact. Sensation intact, DTR normal. Strength 5/5 in all 4.  Psychiatric: Normal judgment and insight. Alert and oriented x 3. Normal mood.     Labs on Admission: I have personally reviewed following labs and imaging studies  CBC: Recent Labs  Lab 01/08/21 0837 01/11/21 0945  WBC 6.8 5.7  NEUTROABS 4.8 5.5  HGB 9.3* 9.0*  HCT 27.5* 27.2*  MCV 95.8 102.6*  PLT 225 893   Basic Metabolic Panel: Recent Labs  Lab 01/08/21 0837 01/11/21 0945  NA 138 137  K 3.1* 4.2  CL 101 101  CO2 27 30  GLUCOSE 156* 196*  BUN 18 30*  CREATININE 1.12 1.28*  CALCIUM 8.5* 8.6*  MG  --  2.0   GFR: Estimated Creatinine Clearance: 48.3 mL/min (A) (by C-G formula based on SCr of 1.28 mg/dL (H)). Liver Function Tests: Recent Labs  Lab 01/08/21 0837 01/11/21 0945  AST 22 40  ALT 27 38  ALKPHOS 114 98  BILITOT 1.1 1.3*  PROT 6.2* 5.6*  ALBUMIN 3.2* 2.8*   Recent Labs  Lab 01/11/21 0945  LIPASE 29   No results for input(s): AMMONIA in the last 168 hours. Coagulation Profile: No results for input(s): INR, PROTIME in the last 168 hours. Cardiac Enzymes: No results for input(s): CKTOTAL, CKMB, CKMBINDEX, TROPONINI in the last 168 hours. BNP (last 3 results) No results for input(s): PROBNP in the last 8760 hours. HbA1C: No results for input(s): HGBA1C in the last 72 hours. CBG: No results for input(s): GLUCAP in the last 168 hours. Lipid Profile: No results for input(s): CHOL, HDL, LDLCALC, TRIG, CHOLHDL, LDLDIRECT in the last 72 hours. Thyroid Function Tests: No results for input(s): TSH, T4TOTAL, FREET4, T3FREE, THYROIDAB in the last 72 hours. Anemia Panel: No results for input(s): VITAMINB12, FOLATE, FERRITIN, TIBC, IRON, RETICCTPCT in the last  72 hours. Urine analysis:    Component Value Date/Time   COLORURINE YELLOW 01/11/2021 0926   APPEARANCEUR CLEAR 01/11/2021 0926   LABSPEC 1.010 01/11/2021 0926   PHURINE 6.5 01/11/2021 0926   GLUCOSEU NEGATIVE 01/11/2021 0926   GLUCOSEU NEGATIVE 11/17/2017 0829   HGBUR NEGATIVE 01/11/2021 0926   BILIRUBINUR NEGATIVE 01/11/2021 0926   BILIRUBINUR Negative 10/10/2018 0946   KETONESUR NEGATIVE 01/11/2021 0926   PROTEINUR NEGATIVE 01/11/2021 0926   UROBILINOGEN 0.2 10/10/2018 0946   UROBILINOGEN 0.2 11/17/2017 0829   NITRITE NEGATIVE 01/11/2021 0926   LEUKOCYTESUR NEGATIVE 01/11/2021 0926    Radiological Exams on Admission: DG Chest Portable 1 View  Result Date: 01/11/2021 CLINICAL DATA:  Hypoxemia. EXAM: PORTABLE CHEST 1 VIEW COMPARISON:  Chest x-rays since December 24, 2020. CT scan of the chest December 26, 2020. FINDINGS: Continued infiltrate is seen in the left base, not significantly changed. No pneumothorax. The cardiomediastinal silhouette is stable. The lungs are unchanged. IMPRESSION: Continued infiltrate in the left base, not significantly changed since December 24, 2020. Pneumonia and aspiration are leading considerations. Recommend clinical correlation and follow-up to complete resolution. Electronically Signed   By: Dorise Bullion III M.D.   On: 01/11/2021 10:20    EKG: Independently reviewed.  Rapid A. fib  Assessment/Plan Principal Problem:   PNA (pneumonia) Active Problems:   Pneumonia  (please populate well all problems here in Problem List. (For example, if patient is on BP meds at home and you resume or decide to hold them, it is a problem that needs to be her. Same for CAD, COPD, HLD and so on)  Sepsis -Evidenced by tachycardia, elevated lactate acid, source considered to be a left lower lobe pneumonia.  Agreed with Antibiotics coverage to vancomycin and ceftriaxone for now. -We will slow down the maintenance IV fluid rate LR at 75 mL/h for the recently  diagnosed systolic CHF. -Speech evaluation  A. fib with RVR -Given the hypotension and history of systolic CHF, will discontinue Cardizem. -Start digoxin loading 0.125 mg Q 6 hours x 4, then tomorrow switch to metoprolol 25 mg twice daily for rate control. -Continue to hold off Eliquis due to recent history of thrombocytopenia.  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  Chronic systolic CHF -Signs of hypovolemia, will hold off Lasix -Discontinue CCB due to decreased LVEF. -Consider restart Lasix tomorrow if blood pressure improves.  COPD -Stable, no wheezing, continue home breathing regimen.  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

## 2021-01-12 ENCOUNTER — Other Ambulatory Visit: Payer: Self-pay

## 2021-01-12 DIAGNOSIS — J189 Pneumonia, unspecified organism: Secondary | ICD-10-CM | POA: Diagnosis not present

## 2021-01-12 DIAGNOSIS — J9601 Acute respiratory failure with hypoxia: Secondary | ICD-10-CM | POA: Diagnosis not present

## 2021-01-12 LAB — BASIC METABOLIC PANEL
Anion gap: 7 (ref 5–15)
BUN: 24 mg/dL — ABNORMAL HIGH (ref 8–23)
CO2: 28 mmol/L (ref 22–32)
Calcium: 8.6 mg/dL — ABNORMAL LOW (ref 8.9–10.3)
Chloride: 100 mmol/L (ref 98–111)
Creatinine, Ser: 1.1 mg/dL (ref 0.61–1.24)
GFR, Estimated: >60 mL/min (ref >60)
Glucose, Bld: 114 mg/dL — ABNORMAL HIGH (ref 70–99)
Potassium: 3.6 mmol/L (ref 3.5–5.1)
Sodium: 135 mmol/L (ref 135–145)

## 2021-01-12 LAB — CBC
HCT: 26.6 % — ABNORMAL LOW (ref 39.0–52.0)
Hemoglobin: 9.1 g/dL — ABNORMAL LOW (ref 13.0–17.0)
MCH: 34.2 pg — ABNORMAL HIGH (ref 26.0–34.0)
MCHC: 34.2 g/dL (ref 30.0–36.0)
MCV: 100 fL (ref 80.0–100.0)
Platelets: 262 10*3/uL (ref 150–400)
RBC: 2.66 MIL/uL — ABNORMAL LOW (ref 4.22–5.81)
RDW: 19.8 % — ABNORMAL HIGH (ref 11.5–15.5)
WBC: 6 10*3/uL (ref 4.0–10.5)
nRBC: 0 % (ref 0.0–0.2)

## 2021-01-12 LAB — URINE CULTURE: Culture: NO GROWTH

## 2021-01-12 LAB — GUARDANT 360

## 2021-01-12 MED ORDER — CEFDINIR 300 MG PO CAPS: 300.0000 mg | ORAL_CAPSULE | Freq: Two times a day (BID) | ORAL | 0 refills | Status: AC

## 2021-01-12 MED ORDER — AZITHROMYCIN 500 MG PO TABS: 500.0000 mg | ORAL_TABLET | Freq: Every day | ORAL | 0 refills | Status: AC

## 2021-01-12 MED ORDER — APIXABAN 5 MG PO TABS
5.0000 mg | ORAL_TABLET | Freq: Two times a day (BID) | ORAL | Status: DC
  Administered 2021-01-12: 09:00:00 5 mg via ORAL
  Filled 2021-01-12: qty 1

## 2021-01-12 MED ORDER — METOPROLOL TARTRATE 25 MG PO TABS
25.0000 mg | ORAL_TABLET | Freq: Two times a day (BID) | ORAL | Status: DC
  Administered 2021-01-12: 09:00:00 25 mg via ORAL
  Filled 2021-01-12: qty 1

## 2021-01-12 NOTE — ED Notes (Signed)
Pt wheeled to waiting room. Pt verbalized understanding of discharge instructions.   

## 2021-01-12 NOTE — Evaluation (Signed)
Occupational Therapy Evaluation Patient Details Name: Joe Hill MRN: [id number] DOB: 03/09/1940 Today's Date: 01/12/2021   History of Present Illness Pt is 68 y/ M presenting with recurrent SOB. Recently hospitalized for LLL pneumonia and discharged home. 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   Clinical Impression   During eval, pt demonstrated safety/independence with ADLs and reports he is at baseline. Supervision with functional mobility primarily due to lines. Pt reports no concerns with his ability to perform ADLs at home. No further skilled OT needs. Will sign off.       Recommendations for follow up therapy are one component of a multi-disciplinary discharge planning process, led by the attending physician.  Recommendations may be updated based on patient status, additional functional criteria and insurance authorization.   Follow Up Recommendations  No OT follow up    Assistance Recommended at Discharge None  Functional Status Assessment  Patient has not had a recent decline in their functional status  Equipment Recommendations  None recommended by OT    Recommendations for Other Services       Precautions / Restrictions Precautions Precautions: Fall Restrictions Weight Bearing Restrictions: No      Mobility Bed Mobility               General bed mobility comments: In recliner    Transfers Overall transfer level: Needs assistance Equipment used: None Transfers: Sit to/from Stand Sit to Stand: Supervision                  Balance Overall balance assessment: Mild deficits observed, not formally tested                                         ADL either performed or assessed with clinical judgement   ADL Overall ADL's : Independent;At  baseline                                             Vision Patient Visual Report: No change from baseline       Perception     Praxis      Pertinent Vitals/Pain Pain Assessment: No/denies pain     Hand Dominance Left   Extremity/Trunk Assessment Upper Extremity Assessment Upper Extremity Assessment: Overall WFL for tasks assessed   Lower Extremity Assessment Lower Extremity Assessment: Defer to PT evaluation   Cervical / Trunk Assessment Cervical / Trunk Assessment: Normal   Communication Communication Communication: No difficulties   Cognition Arousal/Alertness: Awake/alert Behavior During Therapy: Flat affect Overall Cognitive Status: Within Functional Limits for tasks assessed                                       General Comments       Exercises     Shoulder Instructions      Home Living Family/patient expects to be discharged to:: Private residence Living Arrangements: Alone Available Help at Discharge: Family;Available PRN/intermittently Type of Home: House Home Access: Stairs to enter [employer] of Steps: 2 Entrance Stairs-Rails: None Home Layout: One level  Bathroom Shower/Tub: Teacher, early years/pre: Standard     Home Equipment: Conservation officer, nature (2 wheels);Wheelchair - manual;Cane - single point;Shower seat;Grab bars - toilet          Prior Functioning/Environment Prior Level of Function : Independent/Modified Independent;Driving                        OT Problem List: Impaired balance (sitting and/or standing)      OT Treatment/Interventions:      OT Goals(Current goals can be found in the care plan section) Acute Rehab OT Goals Patient Stated Goal: return home OT Goal Formulation: All assessment and education complete, DC therapy  OT Frequency:     Barriers to D/C:            Co-evaluation              AM-PAC OT "6 Clicks" Daily Activity      Outcome Measure Help from another person eating meals?: None Help from another person taking care of personal grooming?: None Help from another person toileting, which includes using toliet, bedpan, or urinal?: None Help from another person bathing (including washing, rinsing, drying)?: None Help from another person to put on and taking off regular upper body clothing?: None Help from another person to put on and taking off regular lower body clothing?: None 6 Click Score: 24   End of Session Nurse Communication: Mobility status  Activity Tolerance: Patient tolerated treatment well Patient left: in chair;with call bell/phone within reach  OT Visit Diagnosis: Unsteadiness on feet (R26.81)                Time: 2297-9892 OT Time Calculation (min): 13 min Charges:  OT General Charges $OT Visit: 1 Visit OT Evaluation $OT Eval Low Complexity: 1 Low  Kattleya Kuhnert C, OT/L  [facility] 01/12/2021, 9:47 AM

## 2021-01-12 NOTE — ED Notes (Signed)
Pt ambulatory with no assistance. Pts O2 remained above 95% while walking. Pts HR did increase to 115 while walking. Dr. Maren Beach aware.

## 2021-01-12 NOTE — ED Notes (Signed)
Pt keeps taking EKG and monitoring cables off.

## 2021-01-12 NOTE — ED Notes (Signed)
Breakfast orders placed 

## 2021-01-12 NOTE — Discharge Summary (Signed)
Physician Discharge Summary  Joe Hill RSW:[id number] DOB: [birth year] DOA: 01/11/2021  PCP: [name], MD  Admit date: 01/11/2021 Discharge date: 01/12/2021  Admitted From: home Disposition:  hom  Recommendations for Outpatient Follow-up:  Follow up with PCP in 1-2 weeks Chest x-ray in 1 week Please obtain BMP/CBC in one week  Home Health:no  Equipment/Devices: none  Discharge Condition: Stable Code Status:   Code Status: DNR Diet recommendation:  Diet Order             Diet Heart Room service appropriate? 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  Was recently hospitalized for LLL pneumonia A. fib discharged on oral antibiotics which he completed a 1 week PTA and was feeling well, had another chemo 4 days prior to admission and started to feel weak 12/17 night had increasing shortness of breath.  No fever or chills. In the ED rapid A. fib, borderline hypothermia WBC 5.2 creatinine 1.2 lactic acid 2.2 given IV antibiotics Vanco ceftriaxone IV fluids chest x-ray with persistent LLL infiltrate since November concern for pneumonia question aspiration. Patient was given digoxin loading and subsequently placed on metoprolol heart rate remains controlled, it appears stable hemodynamically, doing well clinically and requesting for discharge home today.  He is very adamant on going home from ED and does not want to get further admitted as he feels improved and at baseline.  Discharge Diagnoses:  Sepsis POA-resolved LLL pneumonia:Patient had tachycardia lactic acidosis on admission chest x-ray with persistent LLL infiltrate continue with  empiric antibiotics vancomycin and ceftriaxone IV fluid hydration.  Clinically improved, he feels well and he wants to be discharged.  Blood cultures negative so far.  Given his persistent request for discharge since patient is stable will discharge on oral antibiotic with instruction to follow-up with PCP in 1 week repeat chest x-ray.  And discussed with his oncology team.  He will need to follow-up with oncology PCP he may need CT chest as a follow-up.  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  Cortisol 18 TSH normal.  COPD continue home inhalers not wheezing on admission  History of systolic CHF: Presented with hypokalemia, Lasix on hold off CCV due to low EF we will resume Lasix once blood pressure stable today  Deconditioning/PT OT eval -patient ambulated, did well with PT OT and nursing staff, heart rate remains stable   Consults: Oncology NP  Coy Saunas over the phone  Subjective: On RA. No new complaints Overnight afebrile heart rate in 80s to 110, saturating well on room air intermittent mild tachypnea in the 20s Labs reviewed stable lactic acid WC count and renal function asking for discharge.  Discharge Exam: Vitals:   01/12/21 1105 01/12/21 1325  BP:  125/88  Pulse: 84 77  Resp: (!) 24 16  Temp:    SpO2: 99% 99%   General: Pt is alert, awake, not in acute distress Cardiovascular: RRR, S1/S2 +, no rubs, no gallops Respiratory: CTA bilaterally, no wheezing, no rhonchi Abdominal: Soft, NT, ND, bowel sounds + Extremities: no edema, no  cyanosis  Discharge Instructions  Discharge Instructions     Discharge instructions   Complete by: As directed    Please call call MD or return to ER for similar or worsening recurring problem that brought you to hospital or if any fever,nausea/vomiting,abdominal pain, uncontrolled pain, chest pain,  shortness of breath or any other alarming symptoms.  Repeat chest x-ray in morning from PCP, check CBC and BMP in 1 wk   Please follow-up your doctor as instructed in a week time and call the office for appointment.  Please avoid alcohol, smoking, or any other illicit substance and maintain healthy habits including taking your regular medications as prescribed.  You were cared for by a hospitalist during your hospital stay. If you have any questions about your discharge medications or the care you received while you were in the hospital after you are discharged, you can call the unit and ask to speak with the hospitalist on call if the hospitalist that took care of you is not available.  Once you are discharged, your primary care physician will handle any further medical issues. Please note that NO REFILLS for any discharge medications will be authorized once you are discharged, as it is imperative that you return to your primary care physician (or establish a relationship with a primary care physician if you do not have one) for your aftercare needs so that they can reassess your need for medications and monitor your lab values   Increase activity slowly   Complete by: As directed       Allergies as of 01/12/2021       Reactions   Penicillins Hives   Did it involve swelling of the face/tongue/throat, SOB, or low BP? No Did it involve sudden or severe rash/hives, skin peeling, or any reaction on the inside of your mouth or nose? Yes Did you need to seek medical attention at a hospital or doctor's office? No When did it last happen?     childhood  If all above answers are "NO", may  proceed with cephalosporin use.        Medication List     STOP taking these medications    Advil 200 MG Caps Generic drug: Ibuprofen   diltiazem 240 MG 24 hr capsule Commonly known as: Cardizem CD       TAKE these medications    albuterol 108 (90 Base) MCG/ACT inhaler Commonly known as: VENTOLIN HFA TAKE 2 PUFFS BY MOUTH EVERY 6 HOURS AS NEEDED FOR WHEEZE OR SHORTNESS OF BREATH   apixaban 5 MG Tabs tablet Commonly known as: ELIQUIS Take 5 mg by mouth 2 (two) times daily.   azithromycin 500 MG tablet Commonly known as: Zithromax Take 1 tablet (500 mg total) by mouth daily for 3 days. Take 1 tablet daily for 3 days.   cefdinir 300 MG capsule Commonly known as: OMNICEF Take 1 capsule (300 mg total) by mouth 2 (two) times daily for 7 days.   dextromethorphan-guaiFENesin 30-600 MG 12hr tablet Commonly known as: MUCINEX DM Take 1 tablet by mouth 2 (two) times daily as needed for cough.   feeding supplement Liqd Take 1 Container by mouth 2 (two) times daily  as needed (nutrition).   folic acid 1 MG tablet Commonly known as: FOLVITE Take 1 tablet (1 mg total) by mouth daily.   furosemide 40 MG tablet Commonly known as: Lasix Take 1 tablet (40 mg total) by mouth daily for 7 days.   pantoprazole 40 MG tablet Commonly known as: PROTONIX Take 1 tablet (40 mg total) by mouth daily.   potassium chloride SA 20 MEQ tablet Commonly known as: KLOR-CON M Take 1 tablet (20 mEq total) by mouth daily. Take 2 tablets (66meq) for 2 days then take 1 tablet (21meq) daily What changed: additional instructions   prochlorperazine 10 MG tablet Commonly known as: COMPAZINE Take 1 tablet (10 mg total) by mouth every 6 (six) hours as needed for nausea or vomiting.        Allergies  Allergen Reactions   Penicillins Hives    Did it involve swelling of the face/tongue/throat, SOB, or low BP? No Did it involve sudden or severe rash/hives, skin peeling, or any reaction on the  inside of your mouth or nose? Yes Did you need to seek medical attention at a hospital or doctor's office? No When did it last happen?     childhood  If all above answers are "NO", may proceed with cephalosporin use.      The results of significant diagnostics from this hospitalization (including imaging, microbiology, ancillary and laboratory) are listed below for reference.    Microbiology: Recent Results (from the past 240 hour(s))  Urine Culture     Status: None   Collection Time: 01/11/21  9:26 AM   Specimen: Urine, Clean Catch  Result Value Ref Range Status   Specimen Description URINE, CLEAN CATCH  Final   Special Requests NONE  Final   Culture   Final    NO GROWTH Performed at [facility], [address] [place], [place] [address]    Report Status 01/12/2021 FINAL  Final  Culture, blood (routine x 2)     Status: None (Preliminary result)   Collection Time: 01/11/21  9:44 AM   Specimen: BLOOD  Result Value Ref Range Status   Specimen Description BLOOD RIGHT ANTECUBITAL  Final   Special Requests   Final    BOTTLES DRAWN AEROBIC AND ANAEROBIC Blood Culture adequate volume   Culture   Final    NO GROWTH < 24 HOURS Performed at [facility], [place] [address] [place], [place] [address]    Report Status PENDING  Incomplete  Culture, blood (routine x 2)     Status: None (Preliminary result)   Collection Time: 01/11/21  9:57 AM   Specimen: BLOOD LEFT HAND  Result Value Ref Range Status   Specimen Description BLOOD LEFT HAND  Final   Special Requests   Final    BOTTLES DRAWN AEROBIC AND ANAEROBIC Blood Culture adequate volume   Culture   Final    NO GROWTH < 24 HOURS Performed at [facility], [place] [address] [place], [place] [address]    Report Status PENDING  Incomplete  Resp Panel by RT-PCR (Flu A&B, Covid) Nasopharyngeal Swab     Status: None   Collection Time: 01/11/21 10:16 AM   Specimen: Nasopharyngeal Swab; Nasopharyngeal(NP) swabs in vial  transport medium  Result Value Ref Range Status   SARS Coronavirus 2 by RT PCR NEGATIVE NEGATIVE Final    Comment: (NOTE) SARS-CoV-2 target nucleic acids are NOT DETECTED.  The SARS-CoV-2 RNA is generally detectable in upper respiratory specimens during  the acute phase of infection. The lowest concentration of SARS-CoV-2 viral copies this assay can detect is 138 copies/mL. A negative result does not preclude SARS-Cov-2 infection and should not be used as the sole basis for treatment or other patient management decisions. A negative result may occur with  improper specimen collection/handling, submission of specimen other than nasopharyngeal swab, presence of viral mutation(s) within the areas targeted by this assay, and inadequate number of viral copies(<138 copies/mL). A negative result must be combined with clinical observations, patient history, and epidemiological information. The expected result is Negative.  Fact Sheet for Patients:  EntrepreneurPulse.com.au  Fact Sheet for Healthcare Providers:  IncredibleEmployment.be  This test is no t yet approved or cleared by the [place] FDA and  has been authorized for detection and/or diagnosis of SARS-CoV-2 by FDA under an Emergency Use Authorization (EUA). This EUA will remain  in effect (meaning this test can be used) for the duration of the COVID-19 declaration under Section 564(b)(1) of the Act, 21 U.S.C.section 360bbb-3(b)(1), unless the authorization is terminated  or revoked sooner.       Influenza A by PCR NEGATIVE NEGATIVE Final   Influenza B by PCR NEGATIVE NEGATIVE Final    Comment: (NOTE) The Xpert Xpress SARS-CoV-2/FLU/RSV plus assay is intended as an aid in the diagnosis of influenza from Nasopharyngeal swab specimens and should not be used as a sole basis for treatment. Nasal washings and aspirates are unacceptable for Xpert Xpress SARS-CoV-2/FLU/RSV testing.  Fact  Sheet for Patients: EntrepreneurPulse.com.au  Fact Sheet for Healthcare Providers: IncredibleEmployment.be  This test is not yet approved or cleared by the [place] FDA and has been authorized for detection and/or diagnosis of SARS-CoV-2 by FDA under an Emergency Use Authorization (EUA). This EUA will remain in effect (meaning this test can be used) for the duration of the COVID-19 declaration under Section 564(b)(1) of the Act, 21 U.S.C. section 360bbb-3(b)(1), unless the authorization is terminated or revoked.  Performed at [facility], [place] [address], [place], [place] [address]     Procedures/Studies: DG Chest 2 View  Result Date: 12/24/2020 CLINICAL DATA:  Shortness of breath, difficulty breathing EXAM: CHEST - 2 VIEW COMPARISON:  Chest radiograph 12/15/2020 FINDINGS: The cardiomediastinal silhouette is stable. Increased interstitial markings are seen throughout both lungs likely reflecting chronic changes on a background of emphysema as seen on prior CT chest. Patchy opacities in the left base are unchanged. There is no new or worsening focal airspace disease. The known left upper lobe nodule is not well assessed on the current study. There is no pleural effusion or pneumothorax. There is no acute osseous abnormality. IMPRESSION: Patchy opacities in the left base, not significantly changed and again favored to reflect atelectasis. No new or worsening airspace disease. Electronically Signed   By: Valetta Mole M.D.   On: 12/24/2020 11:49   DG Chest 2 View  Result Date: 12/15/2020 CLINICAL DATA:  Cough and congestion EXAM: CHEST - 2 VIEW COMPARISON:  Chest x-ray dated October 28, 2020 FINDINGS: Cardiac and mediastinal contours within normal limits. Unchanged left upper lobe mass. New mild bibasilar opacities. No pleural effusion or pneumothorax. Inferior right costophrenic angle is excluded from the field of view. IMPRESSION: 1. New mild  bibasilar opacities differential includes atelectasis, aspiration or infection. 2. Left upper lobe mass, similar to prior radiograph. Electronically Signed   By: Yetta Glassman M.D.   On: 12/15/2020 15:50   DG Ribs Unilateral W/Chest Left  Result Date: 12/26/2020 CLINICAL DATA:  Fall, left anterior rib pain EXAM: LEFT RIBS AND CHEST - 3+ VIEW COMPARISON:  Chest radiographs and CT chest, 12/24/2020 FINDINGS: No displaced fracture or other bone lesions are seen involving the ribs. There is no evidence of pneumothorax or pleural effusion. Heterogeneous airspace opacity of the left lung base. Unchanged elevation of the left hemidiaphragm. Heart size and mediastinal contours are within normal limits. IMPRESSION: 1. No displaced rib fracture. 2. Heterogeneous airspace opacity of the left lung base, consistent with infection or aspiration, as seen on prior. Electronically Signed   By: Delanna Ahmadi M.D.   On: 12/26/2020 12:14   CT Head Wo Contrast  Result Date: 12/26/2020 CLINICAL DATA:  Neck trauma. Head trauma, minor. Additional history provided: Unwitnessed syncopal episode and fall. Patient reports left rib pain, difficulty taking deep breaths. EXAM: CT HEAD WITHOUT CONTRAST CT CERVICAL SPINE WITHOUT CONTRAST TECHNIQUE: Multidetector CT imaging of the head and cervical spine was performed following the standard protocol without intravenous contrast. Multiplanar CT image reconstructions of the cervical spine were also generated. COMPARISON:  Brain MRI 10/09/2020. FINDINGS: CT HEAD FINDINGS Brain: Mild-to-moderate generalized cerebral atrophy. Comparatively mild cerebellar atrophy. 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 Background moderate/advanced patchy and ill-defined hypoattenuation within the cerebral white matter, nonspecific but compatible with chronic  small vessel ischemic disease. There is no acute intracranial hemorrhage. No demarcated cortical infarct. No extra-axial fluid collection. No evidence of an intracranial mass. No midline shift. Vascular: No hyperdense vessel.  Atherosclerotic calcifications. Skull: Normal. Negative for fracture or focal lesion. Sinuses/Orbits: Visualized orbits show no acute finding. Mild mucosal thickening within the bilateral ethmoid and maxillary sinuses at the imaged levels. CT CERVICAL SPINE FINDINGS Alignment: Straightening of the expected cervical lordosis. No significant spondylolisthesis. Skull base and vertebrae: The basion-dental and atlanto-dental intervals are maintained.No evidence of acute fracture to the cervical spine. Soft tissues and spinal canal: No prevertebral fluid or swelling. No visible canal hematoma. Disc levels: Cervical spondylosis with multilevel disc space narrowing, disc bulges, posterior disc osteophytes, endplate spurring, uncovertebral hypertrophy and facet arthrosis. Disc space narrowing is greatest at C5-C6, C6-C7 and T2-T3 (moderate at these levels). No appreciable levels of severe spinal canal stenosis. Multilevel bony neural foraminal narrowing. Multilevel ventral osteophytes. Upper chest: No consolidation within the imaged lung apices. No visible pneumothorax. Centrilobular and paraseptal emphysema with biapical bulla. Biapical pleuroparenchymal scarring. IMPRESSION: CT head: 1. No evidence of acute intracranial abnormality. 2. 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 3. Mild paranasal sinus disease at the imaged levels. CT cervical spine: 1. No evidence of acute fracture to the cervical spine. 2. Straightening of the expected cervical lordosis. 3. Cervical spondylosis, as described. 4. Emphysema (ICD10-J43.9). Associated biapical pleuroparenchymal scarring and biapical bulla.  Electronically Signed   By: Kellie Simmering D.O.   On: 12/26/2020 13:33   CT Chest W Contrast  Addendum Date: 12/27/2020   ADDENDUM REPORT: 12/27/2020 10:07 ADDENDUM: Upon reassessing imaging there is considerable artifact passing through the RIGHT lower chest and this study is venous phase. Despite images that show clear intraluminal low attenuation and high suspicion for pulmonary embolism there is question due to the artifact seen in this area. Furthermore, to help determine best future management in this complex patient would suggest a study performed in the appropriate phase,  CT PE protocol with the arms up if possible. These results were called by telephone at the time of interpretation on 12/27/2020 at 10:07 am to provider Hosie Poisson MD, who verbally acknowledged these results. Electronically Signed   By: Zetta Bills M.D.   On: 12/27/2020 10:07   Result Date: 12/27/2020 CLINICAL DATA:  History of trauma, unwitnessed syncopal episode in an 80 year old male with history of pulmonary neoplasm. EXAM: CT CHEST WITH CONTRAST TECHNIQUE: Multidetector CT imaging of the chest was performed during intravenous contrast administration. CONTRAST:  59mL OMNIPAQUE IOHEXOL 350 MG/ML SOLN COMPARISON:  December 24, 2020 chest CT. FINDINGS: Cardiovascular: The aorta is normal caliber. Central pulmonary vasculature is normal caliber. Filling defects in the RIGHT lower lobe segmental and subsegmental branches to posterior basal segment (image 126/3) this is in an area of beam hardening artifact. The study is a venous phase evaluation not performed for PE assessment. No additional areas are demonstrated. Heart size is normal without pericardial effusion. There is straightening of the interventricular septum that was present previously. Mediastinum/Nodes: Interval decrease in size of proximally 2 cm LEFT paramediastinal mass contiguous with AP window soft tissue is stable in the short interval. Small lymph nodes throughout  the remainder of the mediastinum likewise are unchanged. Lungs/Pleura: LEFT lower lobe nodularity and airspace process showing no change in the short interval. Signs of pulmonary emphysema and LEFT upper lobe nodule as discussed. Nodular changes have developed in the RIGHT lower lobe, multifocal nodularity since the previous study. Stable background subsolid nodule (image 119/6) 13 x 15 mm, this was not present in August of 2022 nor in September and is therefore favored to represent post inflammatory process. There is mild septal thickening in the RIGHT upper lobe that is new from recent imaging. Mild bronchial wall thickening is present throughout the chest. Upper Abdomen: Incidental imaging of upper abdominal contents without acute process. 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 No acute upper abdominal process. Musculoskeletal: Signs of subacute sternal fracture, sclerotic margins without change, no substantial displacement. Spinal degenerative changes. Costochondral elements are intact. No signs of displaced rib fracture. Visualized clavicles and scapulae are intact. IMPRESSION: Small RIGHT lower lobe pulmonary emboli. Straightening of the interventricular septum is a stable finding and may relate to baseline elevated RIGHT heart pressures. Echocardiographic correlation may be helpful as warranted. Worsening of multifocal pneumonia. Signs of primary lung cancer in the LEFT chest with mediastinal involvement as described previously. 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 Sternal fracture favored to be subacute is new since September of 2022. Not substantially changed since December 24, 2020. Aortic Atherosclerosis (ICD10-I70.0) and Emphysema (ICD10-J43.9). Critical Value/emergent results were called by telephone at the time of interpretation on 12/26/2020 at 1:42 pm to provider [facility] , who verbally acknowledged these results. Electronically Signed: By: Zetta Bills M.D. On: 12/26/2020 13:43   CT Angio Chest Pulmonary Embolism (PE) W or WO Contrast  Result Date: 12/27/2020 CLINICAL DATA:  PE suspected, high probability. Possible PE versus artifact identified incidentally on a CT scan of the chest with contrast from yesterday. EXAM: CT ANGIOGRAPHY CHEST WITH CONTRAST TECHNIQUE: Multidetector CT imaging of the chest was performed using the standard protocol during bolus administration of intravenous contrast. Multiplanar CT image reconstructions and MIPs were obtained to evaluate the vascular anatomy. CONTRAST:  77mL OMNIPAQUE IOHEXOL 350 MG/ML SOLN COMPARISON:  CT scan of the chest 12/26/2020  FINDINGS: Cardiovascular: Satisfactory opacification of the pulmonary arteries to the segmental level. No evidence of pulmonary embolism. Normal heart size. The previously questioned pulmonary emboli are not evident on today's exam and almost certainly represented focal beam hardening artifact. No pericardial effusion. Aortic and coronary artery atherosclerotic calcifications. Mediastinum/Nodes: Stable left paramediastinal mass measuring approximately 2 cm in contiguous with soft tissue in the AP window. Additionally, small scattered mediastinal lymph nodes are also unchanged compared to recent prior imaging. Lungs/Pleura: Continued stability of nonspecific left lower lobe nodularity and left upper lobe pulmonary nodule. Stable appearance of multifocal airspace opacifications in the right lower lobe. Background of moderately severe centrilobular pulmonary emphysema. Slightly increased right lower lobe atelectasis. No pleural effusion or pneumothorax. Overall, unchanged compared to yesterday. Upper Abdomen: No acute abnormality. Musculoskeletal: No acute abnormality. Review of the MIP images confirms the above findings. IMPRESSION: 1. Dedicated CTA imaging confirms that the previously suspected  right lower lobe segmental pulmonary emboli were in fact artifactual. 2. Otherwise, unchanged appearance of multifocal pneumonia and primary left chest lung cancer with mediastinal involvement. Aortic Atherosclerosis (ICD10-I70.0) and Emphysema (ICD10-J43.9). Electronically Signed   By: Jacqulynn Cadet M.D.   On: 12/27/2020 13:38   CT Angio Chest PE W and/or Wo Contrast  Result Date: 12/24/2020 CLINICAL DATA:  Difficulty breathing.  History of lung cancer. EXAM: CT ANGIOGRAPHY CHEST WITH CONTRAST TECHNIQUE: Multidetector CT imaging of the chest was performed using the standard protocol during bolus administration of intravenous contrast. Multiplanar CT image reconstructions and MIPs were obtained to evaluate the vascular anatomy. CONTRAST:  58mL OMNIPAQUE IOHEXOL 350 MG/ML SOLN COMPARISON:  October 15, 2020.  October 15, 2013. FINDINGS: Cardiovascular: Satisfactory opacification of the pulmonary arteries to the segmental level. No evidence of pulmonary embolism. Normal heart size. No pericardial effusion. Mild coronary artery calcifications are noted. Mediastinum/Nodes: Thyroid gland and esophagus are unremarkable. 4.7 x 2.2 cm adenopathy is noted in aortopulmonary window. Lungs/Pleura: Emphysematous disease is noted. No pneumothorax or pleural effusion is noted. Patchy airspace opacities are noted in the left lower lobe concerning for pneumonia. 2.2 x 2.0 cm mass is noted anteriorly in the left upper lobe which does not appear to be significantly changed compared to prior exam and is concerning for malignancy. Several small ill-defined opacities are noted in the right lower lobe which may represent focal inflammation. Upper Abdomen: No acute abnormality. Musculoskeletal: No chest wall abnormality. No acute or significant osseous findings. Review of the MIP images confirms the above findings. IMPRESSION: No definite evidence of pulmonary embolus. New patchy airspace opacities are noted in the left lower  lobe and to a lesser degree in the right lower lobe, concerning for multifocal pneumonia. 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 Aortic Atherosclerosis (ICD10-I70.0) and Emphysema (ICD10-J43.9). Electronically Signed   By: Marijo Conception M.D.   On: 12/24/2020 16:50   CT Cervical Spine Wo Contrast  Result Date: 12/26/2020 CLINICAL DATA:  Neck trauma. Head trauma, minor. Additional history provided: Unwitnessed syncopal episode and fall. Patient reports left rib pain, difficulty taking deep breaths. EXAM: CT HEAD WITHOUT CONTRAST CT CERVICAL SPINE WITHOUT CONTRAST TECHNIQUE: Multidetector CT imaging of the head and cervical spine was performed following the standard protocol without intravenous contrast. Multiplanar CT image reconstructions of the cervical spine were also generated. COMPARISON:  Brain MRI 10/09/2020. FINDINGS: CT HEAD FINDINGS Brain: Mild-to-moderate generalized cerebral atrophy. Comparatively mild cerebellar  atrophy. 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 Background moderate/advanced patchy and ill-defined hypoattenuation within the cerebral white matter, nonspecific but compatible with chronic small vessel ischemic disease. There is no acute intracranial hemorrhage. No demarcated cortical infarct. No extra-axial fluid collection. No evidence of an intracranial mass. No midline shift. Vascular: No hyperdense vessel.  Atherosclerotic calcifications. Skull: Normal. Negative for fracture or focal lesion. Sinuses/Orbits: Visualized orbits show no acute finding. Mild mucosal thickening within the bilateral ethmoid and maxillary sinuses at the imaged levels. CT CERVICAL SPINE FINDINGS Alignment: Straightening of the  expected cervical lordosis. No significant spondylolisthesis. Skull base and vertebrae: The basion-dental and atlanto-dental intervals are maintained.No evidence of acute fracture to the cervical spine. Soft tissues and spinal canal: No prevertebral fluid or swelling. No visible canal hematoma. Disc levels: Cervical spondylosis with multilevel disc space narrowing, disc bulges, posterior disc osteophytes, endplate spurring, uncovertebral hypertrophy and facet arthrosis. Disc space narrowing is greatest at C5-C6, C6-C7 and T2-T3 (moderate at these levels). No appreciable levels of severe spinal canal stenosis. Multilevel bony neural foraminal narrowing. Multilevel ventral osteophytes. Upper chest: No consolidation within the imaged lung apices. No visible pneumothorax. Centrilobular and paraseptal emphysema with biapical bulla. Biapical pleuroparenchymal scarring. IMPRESSION: CT head: 1. No evidence of acute intracranial abnormality. 2. 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 3. Mild paranasal sinus disease at the imaged levels. CT cervical spine: 1. No evidence of acute fracture to the cervical spine. 2. Straightening of the expected cervical lordosis. 3. Cervical spondylosis, as described. 4. Emphysema (ICD10-J43.9). Associated biapical pleuroparenchymal scarring and biapical bulla. Electronically Signed   By: Kellie Simmering D.O.   On: 12/26/2020 13:33   DG Chest Portable 1 View  Result Date: 01/11/2021 CLINICAL DATA:  Hypoxemia. EXAM: PORTABLE CHEST 1 VIEW COMPARISON:  Chest x-rays since December 24, 2020. CT scan of the chest December 26, 2020. FINDINGS: Continued infiltrate is seen in the left base, not significantly changed. No pneumothorax. The cardiomediastinal silhouette is stable. The lungs are unchanged. IMPRESSION: Continued infiltrate in the left base, not significantly changed since  December 24, 2020. Pneumonia and aspiration are leading considerations. Recommend clinical correlation and follow-up to complete resolution. Electronically Signed   By: Dorise Bullion III M.D.   On: 01/11/2021 10:20   ECHOCARDIOGRAM COMPLETE  Result Date: 12/27/2020    ECHOCARDIOGRAM REPORT   Patient Name:   Joe Hill Date of Exam: 12/27/2020 Medical Rec #:  [id number]      Height:       70.0 in Accession #:    [id number]     Weight:       173.5 lb Date of Birth:  [birth year]      BSA:          1.965 m Patient Age:    80 years       BP:           106/73 mmHg Patient Gender: M              HR:           81 bpm. Exam Location:  Inpatient Procedure: 2D Echo, Cardiac Doppler and Color Doppler Indications:    Pulmonary Embolus I26.09  History:        Patient has no prior history of  Echocardiogram examinations.                 COPD; Risk Factors:Hypertension.  Sonographer:    Bernadene Person RDCS Referring Phys: [id number] [place]  1. Left ventricular ejection fraction, by estimation, is 40 to 45%. The left ventricle has mildly decreased function. The left ventricle demonstrates global hypokinesis. The left ventricular internal cavity size was mildly dilated. Left ventricular diastolic function could not be evaluated.  2. Right ventricular systolic function is normal. The right ventricular size is normal. There is normal pulmonary artery systolic pressure.  3. Left atrial size was mildly dilated.  4. Right atrial size was mildly dilated.  5. The mitral valve is normal in structure. Trivial mitral valve regurgitation.  6. The aortic valve is calcified. There is mild calcification of the aortic valve. There is mild thickening of the aortic valve. Aortic valve regurgitation is mild to moderate.  7. Aortic dilatation noted. There is borderline dilatation of the ascending aorta, measuring 39 mm. FINDINGS  Left Ventricle: Left ventricular ejection fraction, by estimation, is 40 to 45%. The left  ventricle has mildly decreased function. The left ventricle demonstrates global hypokinesis. The left ventricular internal cavity size was mildly dilated. There is  no left ventricular hypertrophy. Left ventricular diastolic function could not be evaluated due to atrial fibrillation. Left ventricular diastolic function could not be evaluated. Right Ventricle: The right ventricular size is normal. Right vetricular wall thickness was not well visualized. Right ventricular systolic function is normal. There is normal pulmonary artery systolic pressure. The tricuspid regurgitant velocity is 2.18 m/s, and with an assumed right atrial pressure of 3 mmHg, the estimated right ventricular systolic pressure is 97.0 mmHg. Left Atrium: Left atrial size was mildly dilated. Right Atrium: Right atrial size was mildly dilated. Pericardium: There is no evidence of pericardial effusion. Mitral Valve: The mitral valve is normal in structure. Trivial mitral valve regurgitation. Tricuspid Valve: The tricuspid valve is grossly normal. Tricuspid valve regurgitation is trivial. Aortic Valve: The aortic valve is calcified. There is mild calcification of the aortic valve. There is mild thickening of the aortic valve. There is mild aortic valve annular calcification. Aortic valve regurgitation is mild to moderate. Aortic regurgitation PHT measures 326 msec. Pulmonic Valve: The pulmonic valve was not well visualized. Pulmonic valve regurgitation is not visualized. Aorta: Aortic dilatation noted. There is borderline dilatation of the ascending aorta, measuring 39 mm. IAS/Shunts: The atrial septum is grossly normal.  LEFT VENTRICLE PLAX 2D LVIDd:         5.00 cm LVIDs:         4.20 cm LV PW:         1.00 cm LV IVS:        1.00 cm LVOT diam:     2.20 cm LV SV:         69 LV SV Index:   35 LVOT Area:     3.80 cm  LV Volumes (MOD) LV vol d, MOD A2C: 126.0 ml LV vol d, MOD A4C: 96.6 ml LV vol s, MOD A2C: 70.9 ml LV vol s, MOD A4C: 58.1 ml LV SV MOD  A2C:     55.1 ml LV SV MOD A4C:     96.6 ml LV SV MOD BP:      49.7 ml RIGHT VENTRICLE TAPSE (M-mode): 1.7 cm LEFT ATRIUM             Index        RIGHT ATRIUM  Index LA diam:        4.60 cm 2.34 cm/m   RA Area:     19.10 cm LA Vol (A2C):   62.9 ml 32.01 ml/m  RA Volume:   56.70 ml  28.86 ml/m LA Vol (A4C):   57.7 ml 29.36 ml/m LA Biplane Vol: 66.3 ml 33.74 ml/m  AORTIC VALVE LVOT Vmax:   99.30 cm/s LVOT Vmean:  66.200 cm/s LVOT VTI:    0.182 m AI PHT:      326 msec  AORTA Ao Root diam: 3.90 cm Ao Asc diam:  3.90 cm TRICUSPID VALVE TR Peak grad:   19.0 mmHg TR Vmax:        218.00 cm/s  SHUNTS Systemic VTI:  0.18 m Systemic Diam: 2.20 cm Mertie Moores MD Electronically signed by Mertie Moores MD Signature Date/Time: 12/27/2020/4:31:49 PM    Final    VAS [place] LOWER EXTREMITY VENOUS (DVT)  Result Date: 01/01/2021  Lower Venous DVT Study Patient Name:  Joe Hill  Date of Exam:   01/01/2021 Medical Rec #: [id number]       Accession #:    [id number] Date of Birth: 1940/07/26       Patient Gender: M Patient Age:   63 years Exam Location:  [facility] Procedure:      VAS [place] LOWER EXTREMITY VENOUS (DVT) Referring Phys: ERIC [place] --------------------------------------------------------------------------------  Indications: Edema.  Risk Factors: Cancer. Comparison Study: No prior studies. Performing Technologist: Oliver Hum RVT  Examination Guidelines: A complete evaluation includes B-mode imaging, spectral Doppler, color Doppler, and power Doppler as needed of all accessible portions of each vessel. Bilateral testing is considered an integral part of a complete examination. Limited examinations for reoccurring indications may be performed as noted. The reflux portion of the exam is performed with the patient in reverse Trendelenburg.  +---------+---------------+---------+-----------+----------+--------------+  RIGHT     Compressibility Phasicity Spontaneity Properties Thrombus Aging   +---------+---------------+---------+-----------+----------+--------------+  CFV       Full            Yes       Yes                                    +---------+---------------+---------+-----------+----------+--------------+  SFJ       Full                                                             +---------+---------------+---------+-----------+----------+--------------+  FV Prox   Full                                                             +---------+---------------+---------+-----------+----------+--------------+  FV Mid    Full                                                             +---------+---------------+---------+-----------+----------+--------------+  FV Distal Full                                                             +---------+---------------+---------+-----------+----------+--------------+  PFV       Full                                                             +---------+---------------+---------+-----------+----------+--------------+  POP       Full            Yes       Yes                                    +---------+---------------+---------+-----------+----------+--------------+  PTV       Full                                                             +---------+---------------+---------+-----------+----------+--------------+  PERO      Full                                                             +---------+---------------+---------+-----------+----------+--------------+   +---------+---------------+---------+-----------+----------+--------------+  LEFT      Compressibility Phasicity Spontaneity Properties Thrombus Aging  +---------+---------------+---------+-----------+----------+--------------+  CFV       Full            Yes       Yes                                    +---------+---------------+---------+-----------+----------+--------------+  SFJ       Full                                                              +---------+---------------+---------+-----------+----------+--------------+  FV Prox   Full                                                             +---------+---------------+---------+-----------+----------+--------------+  FV Mid    Full                                                             +---------+---------------+---------+-----------+----------+--------------+  FV Distal Full                                                             +---------+---------------+---------+-----------+----------+--------------+  PFV       Full                                                             +---------+---------------+---------+-----------+----------+--------------+  POP       Full            Yes       Yes                                    +---------+---------------+---------+-----------+----------+--------------+  PTV       Full                                                             +---------+---------------+---------+-----------+----------+--------------+  PERO      Full                                                             +---------+---------------+---------+-----------+----------+--------------+     Summary: RIGHT: - There is no evidence of deep vein thrombosis in the lower extremity.  - No cystic structure found in the popliteal fossa.  LEFT: - There is no evidence of deep vein thrombosis in the lower extremity.  - No cystic structure found in the popliteal fossa.  *See table(s) above for measurements and observations. Electronically signed by Orlie Pollen on 01/01/2021 at 5:17:49 PM.    Final     Labs: BNP (last 3 results) Recent Labs    12/24/20 1200 01/11/21 0945  BNP 256.2* 295.6*   Basic Metabolic Panel: Recent Labs  Lab 01/08/21 0837 01/11/21 0945 01/12/21 0550  NA 138 137 135  K 3.1* 4.2 3.6  CL 101 101 100  CO2 27 30 28   GLUCOSE 156* 196* 114*  BUN 18 30* 24*  CREATININE 1.12 1.28* 1.10  CALCIUM 8.5* 8.6* 8.6*  MG  --  2.0  --    Liver Function  Tests: Recent Labs  Lab 01/08/21 0837 01/11/21 0945  AST 22 40  ALT 27 38  ALKPHOS 114 98  BILITOT 1.1 1.3*  PROT 6.2* 5.6*  ALBUMIN 3.2* 2.8*   Recent Labs  Lab 01/11/21 0945  LIPASE 29   No results for input(s): AMMONIA in the last 168 hours. CBC: Recent Labs  Lab 01/08/21 0837 01/11/21 0945 01/12/21 0550  WBC 6.8 5.7 6.0  NEUTROABS 4.8 5.5  --   HGB 9.3* 9.0* 9.1*  HCT 27.5* 27.2* 26.6*  MCV 95.8 102.6* 100.0  PLT  225 239 262   Cardiac Enzymes: No results for input(s): CKTOTAL, CKMB, CKMBINDEX, TROPONINI in the last 168 hours. BNP: Invalid input(s): POCBNP CBG: No results for input(s): GLUCAP in the last 168 hours. D-Dimer No results for input(s): DDIMER in the last 72 hours. Hgb A1c No results for input(s): HGBA1C in the last 72 hours. Lipid Profile No results for input(s): CHOL, HDL, LDLCALC, TRIG, CHOLHDL, LDLDIRECT in the last 72 hours. Thyroid function studies Recent Labs    01/11/21 0945  TSH 1.218   Anemia work up No results for input(s): VITAMINB12, FOLATE, FERRITIN, TIBC, IRON, RETICCTPCT in the last 72 hours. Urinalysis    Component Value Date/Time   COLORURINE YELLOW 01/11/2021 0926   APPEARANCEUR CLEAR 01/11/2021 0926   LABSPEC 1.010 01/11/2021 0926   PHURINE 6.5 01/11/2021 0926   GLUCOSEU NEGATIVE 01/11/2021 0926   GLUCOSEU NEGATIVE 11/17/2017 0829   HGBUR NEGATIVE 01/11/2021 0926   BILIRUBINUR NEGATIVE 01/11/2021 0926   BILIRUBINUR Negative 10/10/2018 0946   KETONESUR NEGATIVE 01/11/2021 0926   PROTEINUR NEGATIVE 01/11/2021 0926   UROBILINOGEN 0.2 10/10/2018 0946   UROBILINOGEN 0.2 11/17/2017 0829   NITRITE NEGATIVE 01/11/2021 0926   LEUKOCYTESUR NEGATIVE 01/11/2021 0926   Sepsis Labs Invalid input(s): PROCALCITONIN,  WBC,  LACTICIDVEN Microbiology Recent Results (from the past 240 hour(s))  Urine Culture     Status: None   Collection Time: 01/11/21  9:26 AM   Specimen: Urine, Clean Catch  Result Value Ref Range Status    Specimen Description URINE, CLEAN CATCH  Final   Special Requests NONE  Final   Culture   Final    NO GROWTH Performed at [facility], [place] [address], [place], [place] [address]    Report Status 01/12/2021 FINAL  Final  Culture, blood (routine x 2)     Status: None (Preliminary result)   Collection Time: 01/11/21  9:44 AM   Specimen: BLOOD  Result Value Ref Range Status   Specimen Description BLOOD RIGHT ANTECUBITAL  Final   Special Requests   Final    BOTTLES DRAWN AEROBIC AND ANAEROBIC Blood Culture adequate volume   Culture   Final    NO GROWTH < 24 HOURS Performed at [facility], [place] [name], [place], [place] [address]    Report Status PENDING  Incomplete  Culture, blood (routine x 2)     Status: None (Preliminary result)   Collection Time: 01/11/21  9:57 AM   Specimen: BLOOD LEFT HAND  Result Value Ref Range Status   Specimen Description BLOOD LEFT HAND  Final   Special Requests   Final    BOTTLES DRAWN AEROBIC AND ANAEROBIC Blood Culture adequate volume   Culture   Final    NO GROWTH < 24 HOURS Performed at [facility], [place] [address] [place], [place] [address]    Report Status PENDING  Incomplete  Resp Panel by RT-PCR (Flu A&B, Covid) Nasopharyngeal Swab     Status: None   Collection Time: 01/11/21 10:16 AM   Specimen: Nasopharyngeal Swab; Nasopharyngeal(NP) swabs in vial transport medium  Result Value Ref Range Status   SARS Coronavirus 2 by RT PCR NEGATIVE NEGATIVE Final    Comment: (NOTE) SARS-CoV-2 target nucleic acids are NOT DETECTED.  The SARS-CoV-2 RNA is generally detectable in upper respiratory specimens during the acute phase of infection. The lowest concentration of SARS-CoV-2 viral copies this assay can detect is 138 copies/mL. A negative result does not preclude SARS-Cov-2 infection and should not  be used as the sole basis for treatment or other patient management decisions. A negative result may occur with  improper specimen  collection/handling, submission of specimen other than nasopharyngeal swab, presence of viral mutation(s) within the areas targeted by this assay, and inadequate number of viral copies(<138 copies/mL). A negative result must be combined with clinical observations, patient history, and epidemiological information. The expected result is Negative.  Fact Sheet for Patients:  EntrepreneurPulse.com.au  Fact Sheet for Healthcare Providers:  IncredibleEmployment.be  This test is no t yet approved or cleared by the [place] FDA and  has been authorized for detection and/or diagnosis of SARS-CoV-2 by FDA under an Emergency Use Authorization (EUA). This EUA will remain  in effect (meaning this test can be used) for the duration of the COVID-19 declaration under Section 564(b)(1) of the Act, 21 U.S.C.section 360bbb-3(b)(1), unless the authorization is terminated  or revoked sooner.       Influenza A by PCR NEGATIVE NEGATIVE Final   Influenza B by PCR NEGATIVE NEGATIVE Final    Comment: (NOTE) The Xpert Xpress SARS-CoV-2/FLU/RSV plus assay is intended as an aid in the diagnosis of influenza from Nasopharyngeal swab specimens and should not be used as a sole basis for treatment. Nasal washings and aspirates are unacceptable for Xpert Xpress SARS-CoV-2/FLU/RSV testing.  Fact Sheet for Patients: EntrepreneurPulse.com.au  Fact Sheet for Healthcare Providers: IncredibleEmployment.be  This test is not yet approved or cleared by the [place] FDA and has been authorized for detection and/or diagnosis of SARS-CoV-2 by FDA under an Emergency Use Authorization (EUA). This EUA will remain in effect (meaning this test can be used) for the duration of the COVID-19 declaration under Section 564(b)(1) of the Act, 21 U.S.C. section 360bbb-3(b)(1), unless the authorization is terminated or revoked.  Performed at [facility], [place] [address] [place],  [address]      Time coordinating discharge: 25 minutes  SIGNED: Antonieta Pert, MD  [facility] 01/12/2021, 2:30 PM  If 7PM-7AM, please contact night-coverage [email or web address]

## 2021-01-12 NOTE — Progress Notes (Signed)
PT Cancellation Note  Patient Details Name: Joe Hill MRN: [id number] DOB: 12/11/1940   Cancelled Treatment:    Reason Eval/Treat Not Completed: Other (comment).  Declining PT since he has been OOB to walk and feels he is at baseline.  DC PT for now.   Ramond Dial 01/12/2021, 3:32 PM  Mee Hives, PT PhD Acute Rehab Dept. Number: [place] [facility] [place]

## 2021-01-15 ENCOUNTER — Other Ambulatory Visit: Payer: Self-pay | Admitting: Medical Oncology

## 2021-01-15 ENCOUNTER — Other Ambulatory Visit: Payer: Self-pay

## 2021-01-15 ENCOUNTER — Inpatient Hospital Stay: Payer: Medicare Other

## 2021-01-15 ENCOUNTER — Emergency Department (HOSPITAL_COMMUNITY)
Admission: EM | Admit: 2021-01-15 | Discharge: 2021-01-16 | Disposition: A | Discharge status: Home / Self Care | Payer: Medicare Other | Attending: Emergency Medicine | Admitting: Emergency Medicine

## 2021-01-15 DIAGNOSIS — Z7901 Long term (current) use of anticoagulants: Secondary | ICD-10-CM | POA: Diagnosis not present

## 2021-01-15 DIAGNOSIS — Z8546 Personal history of malignant neoplasm of prostate: Secondary | ICD-10-CM | POA: Insufficient documentation

## 2021-01-15 DIAGNOSIS — Z85118 Personal history of other malignant neoplasm of bronchus and lung: Secondary | ICD-10-CM | POA: Insufficient documentation

## 2021-01-15 DIAGNOSIS — Z20822 Contact with and (suspected) exposure to covid-19: Secondary | ICD-10-CM | POA: Diagnosis not present

## 2021-01-15 DIAGNOSIS — R0602 Shortness of breath: Secondary | ICD-10-CM | POA: Diagnosis not present

## 2021-01-15 DIAGNOSIS — R911 Solitary pulmonary nodule: Secondary | ICD-10-CM | POA: Diagnosis not present

## 2021-01-15 DIAGNOSIS — R609 Edema, unspecified: Secondary | ICD-10-CM

## 2021-01-15 DIAGNOSIS — I509 Heart failure, unspecified: Secondary | ICD-10-CM | POA: Insufficient documentation

## 2021-01-15 DIAGNOSIS — I110 Hypertensive heart disease with heart failure: Secondary | ICD-10-CM | POA: Diagnosis not present

## 2021-01-15 DIAGNOSIS — C3412 Malignant neoplasm of upper lobe, left bronchus or lung: Secondary | ICD-10-CM

## 2021-01-15 DIAGNOSIS — R600 Localized edema: Secondary | ICD-10-CM | POA: Insufficient documentation

## 2021-01-15 DIAGNOSIS — J449 Chronic obstructive pulmonary disease, unspecified: Secondary | ICD-10-CM | POA: Insufficient documentation

## 2021-01-15 DIAGNOSIS — Z7952 Long term (current) use of systemic steroids: Secondary | ICD-10-CM | POA: Insufficient documentation

## 2021-01-15 DIAGNOSIS — D61818 Other pancytopenia: Secondary | ICD-10-CM

## 2021-01-15 DIAGNOSIS — F17210 Nicotine dependence, cigarettes, uncomplicated: Secondary | ICD-10-CM | POA: Diagnosis not present

## 2021-01-15 DIAGNOSIS — C3492 Malignant neoplasm of unspecified part of left bronchus or lung: Secondary | ICD-10-CM

## 2021-01-15 LAB — CBC WITH DIFFERENTIAL (CANCER CENTER ONLY)
Abs Immature Granulocytes: 0.02 10*3/uL (ref 0.00–0.07)
Basophils Absolute: 0 10*3/uL (ref 0.0–0.1)
Basophils Relative: 0 %
Eosinophils Absolute: 0 10*3/uL (ref 0.0–0.5)
Eosinophils Relative: 0 %
HCT: 23.3 % — ABNORMAL LOW (ref 39.0–52.0)
Hemoglobin: 7.8 g/dL — ABNORMAL LOW (ref 13.0–17.0)
Immature Granulocytes: 0 %
Lymphocytes Relative: 10 %
Lymphs Abs: 0.5 10*3/uL — ABNORMAL LOW (ref 0.7–4.0)
MCH: 33.5 pg (ref 26.0–34.0)
MCHC: 33.5 g/dL (ref 30.0–36.0)
MCV: 100 fL (ref 80.0–100.0)
Monocytes Absolute: 0.1 10*3/uL (ref 0.1–1.0)
Monocytes Relative: 2 %
Neutro Abs: 4.3 10*3/uL (ref 1.7–7.7)
Neutrophils Relative %: 88 %
Platelet Count: 181 10*3/uL (ref 150–400)
RBC: 2.33 MIL/uL — ABNORMAL LOW (ref 4.22–5.81)
RDW: 19.9 % — ABNORMAL HIGH (ref 11.5–15.5)
WBC Count: 4.9 10*3/uL (ref 4.0–10.5)
nRBC: 0 % (ref 0.0–0.2)

## 2021-01-15 LAB — SAMPLE TO BLOOD BANK

## 2021-01-15 LAB — CMP (CANCER CENTER ONLY)
ALT: 21 U/L (ref 0–44)
AST: 18 U/L (ref 15–41)
Albumin: 3.6 g/dL (ref 3.5–5.0)
Alkaline Phosphatase: 108 U/L (ref 38–126)
Anion gap: 7 (ref 5–15)
BUN: 31 mg/dL — ABNORMAL HIGH (ref 8–23)
CO2: 28 mmol/L (ref 22–32)
Calcium: 9.4 mg/dL (ref 8.9–10.3)
Chloride: 106 mmol/L (ref 98–111)
Creatinine: 1.02 mg/dL (ref 0.61–1.24)
GFR, Estimated: >60 mL/min (ref >60)
Glucose, Bld: 150 mg/dL — ABNORMAL HIGH (ref 70–99)
Potassium: 3.9 mmol/L (ref 3.5–5.1)
Sodium: 141 mmol/L (ref 135–145)
Total Bilirubin: 1 mg/dL (ref 0.3–1.2)
Total Protein: 6.5 g/dL (ref 6.5–8.1)

## 2021-01-15 LAB — PREPARE RBC (CROSSMATCH)

## 2021-01-15 NOTE — Progress Notes (Signed)
Pt notified of blood transfusion for tomorrow.

## 2021-01-16 ENCOUNTER — Emergency Department (HOSPITAL_COMMUNITY): Payer: Medicare Other

## 2021-01-16 ENCOUNTER — Inpatient Hospital Stay: Payer: Medicare Other

## 2021-01-16 DIAGNOSIS — C3492 Malignant neoplasm of unspecified part of left bronchus or lung: Secondary | ICD-10-CM

## 2021-01-16 DIAGNOSIS — R911 Solitary pulmonary nodule: Secondary | ICD-10-CM | POA: Diagnosis not present

## 2021-01-16 DIAGNOSIS — R0602 Shortness of breath: Secondary | ICD-10-CM | POA: Diagnosis not present

## 2021-01-16 LAB — CULTURE, BLOOD (ROUTINE X 2)
Culture: NO GROWTH
Culture: NO GROWTH
Special Requests: ADEQUATE
Special Requests: ADEQUATE

## 2021-01-16 LAB — TROPONIN I (HIGH SENSITIVITY)
Troponin I (High Sensitivity): 8 ng/L (ref <18)
Troponin I (High Sensitivity): 8 ng/L (ref <18)

## 2021-01-16 LAB — RESP PANEL BY RT-PCR (FLU A&B, COVID) ARPGX2
Influenza A by PCR: NEGATIVE
Influenza B by PCR: NEGATIVE
SARS Coronavirus 2 by RT PCR: NEGATIVE

## 2021-01-16 LAB — BRAIN NATRIURETIC PEPTIDE: B Natriuretic Peptide: 165.5 pg/mL — ABNORMAL HIGH (ref 0.0–100.0)

## 2021-01-16 MED ORDER — DIPHENHYDRAMINE HCL 25 MG PO CAPS
25.0000 mg | ORAL_CAPSULE | Freq: Once | ORAL | Status: AC
  Administered 2021-01-16: 12:00:00 25 mg via ORAL
  Filled 2021-01-16: qty 1

## 2021-01-16 MED ORDER — ACETAMINOPHEN 325 MG PO TABS
650.0000 mg | ORAL_TABLET | Freq: Once | ORAL | Status: AC
  Administered 2021-01-16: 12:00:00 650 mg via ORAL
  Filled 2021-01-16: qty 2

## 2021-01-16 MED ORDER — FUROSEMIDE 10 MG/ML IJ SOLN
20.0000 mg | Freq: Once | INTRAMUSCULAR | Status: AC
  Administered 2021-01-16: 03:00:00 20 mg via INTRAVENOUS
  Filled 2021-01-16: qty 4

## 2021-01-16 MED ORDER — SODIUM CHLORIDE 0.9% IV SOLUTION: 250.0000 mL | Freq: Once | INTRAVENOUS | Status: DC

## 2021-01-16 NOTE — ED Provider Notes (Signed)
[facility] DEPT Provider Note  CSN: [id number] Arrival date & time: 01/15/21 2353  Chief Complaint(s) Shortness of Breath  HPI Joe Hill is a 80 y.o. male with a past medical history listed below including lung cancer currently on chemotherapy, atrial fibrillation on Eliquis, CHF who was previously on Lasix but taken off during recent admission for pneumonia.  Patient still on oral antibiotics.  He came in today for 2 hours of gradually worsening shortness of breath.  Patient has a history of orthopnea and sleeps upright.  Endorses mild cough.  No recurrent fevers.  No chest pain.  No abdominal pain.  No nausea or vomiting.  Peripheral edema has worsened over the past week.  Additionally patient reports that he is scheduled for blood transfusion later on this morning after he had routine labs drawn in the clinic today which noted a drop in his hemoglobin.  Review of system, patient's hemoglobin down from 9.1 to 7.8.  He denies any bloody bowel movements or melena.  Rest of the labs reassuring without significant electrolyte derangement or renal insufficiency.  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   TOBACCO USE 01/30/2007   Patient Active Problem List   Diagnosis Date Noted   PNA (pneumonia) 01/11/2021   Hypotension 01/02/2021   Chemotherapy-induced neutropenia (HCC)    Syncope and collapse 12/26/2020   Pancytopenia ([place]) 12/26/2020   Hypokalemia 12/26/2020   Borderline prolonged QT interval 12/26/2020   Pneumonia 12/26/2020   Pulmonary embolism ([place]) 12/26/2020   Atrial fibrillation with RVR ([place]) 12/26/2020   Encounter for antineoplastic  chemotherapy 11/18/2020   Encounter for antineoplastic immunotherapy 11/18/2020   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 Authorizing Provider  albuterol (VENTOLIN HFA) 108 (90 Base) MCG/ACT inhaler TAKE 2 PUFFS BY MOUTH EVERY 6 HOURS AS NEEDED FOR WHEEZE OR SHORTNESS OF BREATH 01/07/21   [name], MD  apixaban (ELIQUIS) 5 MG TABS tablet Take 5 mg by mouth 2 (two) times daily.    [provider]  cefdinir (OMNICEF) 300 MG capsule Take 1 capsule (300 mg total) by mouth 2 (two) times daily for 7 days. 01/12/21 01/19/21  Antonieta Pert, MD  dextromethorphan-guaiFENesin (MUCINEX DM) 30-600 MG 12hr tablet Take 1 tablet by mouth 2 (two) times daily as needed for cough.    [provider]  feeding supplement (BOOST HIGH PROTEIN) LIQD Take 1 Container by mouth 2 (two) times daily as needed (nutrition).    [provider]  folic acid (FOLVITE) 1 MG tablet Take 1 tablet (1 mg total) by mouth daily. 11/18/20   Curt Bears, MD  furosemide (LASIX) 40 MG tablet Take 1 tablet (40 mg total) by mouth daily for 7 days. 01/02/21 01/09/21  [place] Joe J, DO  pantoprazole (PROTONIX) 40 MG tablet Take 1  tablet (40 mg total) by mouth daily. 01/02/21 04/02/21  [place] Donnamarie Poag, DO  potassium chloride SA (KLOR-CON) 20 MEQ tablet Take 1 tablet (20 mEq total) by mouth daily. Take 2 tablets (73meq) for 2 days then take 1 tablet (50meq) daily Patient taking differently: Take 20 mEq by mouth daily. 11/17/20   Shirley Friar, PA-C  prochlorperazine (COMPAZINE) 10 MG tablet  Take 1 tablet (10 mg total) by mouth every 6 (six) hours as needed for nausea or vomiting. 11/18/20   Curt Bears, MD                                                                                                                                    Past Surgical History Past Surgical History:  Procedure Laterality Date   APPENDECTOMY     CATARACT EXTRACTION     PROSTATE SURGERY     prostatectomy   VIDEO BRONCHOSCOPY WITH ENDOBRONCHIAL NAVIGATION N/A 10/30/2020   Procedure: VIDEO BRONCHOSCOPY WITH ENDOBRONCHIAL NAVIGATION;  Surgeon: Melrose Nakayama, MD;  Location: [place];  Service: Thoracic;  Laterality: N/A;   VIDEO BRONCHOSCOPY WITH ENDOBRONCHIAL ULTRASOUND N/A 10/30/2020   Procedure: VIDEO BRONCHOSCOPY WITH ENDOBRONCHIAL ULTRASOUND;  Surgeon: Melrose Nakayama, MD;  Location: [place];  Service: Thoracic;  Laterality: N/A;   Family History Family History  Problem Relation Age of Onset   Hyperlipidemia Mother    Cancer Father        lung, smoker   Cancer Sister        breast, smoker   Arthritis Maternal Aunt     Social History Social History   Tobacco Use   Smoking status: Every Day    Packs/day: 0.25    Types: Cigarettes   Smokeless tobacco: Never  Vaping Use   Vaping Use: Never used  Substance Use Topics   Alcohol use: No    Alcohol/week: 0.0 standard drinks   Drug use: No   Allergies Penicillins  Review of Systems Review of Systems  Respiratory:  Positive for shortness of breath.   All other systems are reviewed and are negative for acute change except as noted in the HPI  Physical Exam Vital Signs  I have reviewed the triage vital signs BP (!) 125/73   Pulse (!) 95    Temp 99.9 F (37.7 C) (Oral)    Resp (!) 22    SpO2 100%   Physical Exam Vitals reviewed.  Constitutional:      General: He is not in acute distress.    Appearance: He is well-developed. He is not diaphoretic.  HENT:     Head: Normocephalic and atraumatic.     Nose: Nose normal.   Eyes:     General: No scleral icterus.       Right eye: No discharge.        Left eye: No discharge.     Conjunctiva/sclera: Conjunctivae normal.     Pupils:  Pupils are equal, round, and reactive to light.  Cardiovascular:     Rate and Rhythm: Normal rate and regular rhythm.     Heart sounds: No murmur heard.   No friction rub. No gallop.  Pulmonary:     Effort: Pulmonary effort is normal. No respiratory distress.     Breath sounds: No stridor. Examination of the right-lower field reveals wheezing. Examination of the left-lower field reveals wheezing and rales. Wheezing and rales present.  Abdominal:     General: There is no distension.     Palpations: Abdomen is soft.     Tenderness: There is no abdominal tenderness.  Musculoskeletal:        General: No tenderness.     Cervical back: Normal range of motion and neck supple.     Right lower leg: 3+ Edema present.     Left lower leg: 3+ Edema present.  Skin:    General: Skin is warm and dry.     Findings: No erythema or rash.  Neurological:     Mental Status: He is alert and oriented to person, place, and time.    ED Results and Treatments Labs (all labs ordered are listed, but only abnormal results are displayed) Labs Reviewed  BRAIN NATRIURETIC PEPTIDE - Abnormal; Notable for the following components:      Result Value   B Natriuretic Peptide 165.5 (*)    All other components within normal limits  RESP PANEL BY RT-PCR (FLU A&B, COVID) [id number]  TROPONIN I (HIGH SENSITIVITY)  TROPONIN I (HIGH SENSITIVITY)                                                                                                                         EKG  EKG Interpretation  Date/Time:  Friday January 16 2021 00:08:05 EST Ventricular Rate:  81 PR Interval:    QRS Duration: 96 QT Interval:  366 QTC Calculation: 425 R Axis:   2 Text Interpretation: Atrial fibrillation Abnormal R-wave progression, early transition Borderline repolarization  abnormality Confirmed by Addison Lank [id number]) on 01/16/2021 12:31:58 AM       Radiology DG Chest 2 View  Result Date: 01/16/2021 CLINICAL DATA:  Shortness of breath, history of lung carcinoma EXAM: CHEST - 2 VIEW COMPARISON:  01/11/2021, CT from 12/27/2020 FINDINGS: Cardiac shadow is within normal limits. Patchy airspace opacity is again noted in the left base stable from the prior exam and prior CT examination. The known left upper lobe nodule is identified adjacent to the aortic knob. No new infiltrate or sizable effusion is seen. No acute bony abnormality is noted. IMPRESSION: Stable changes in the chest similar to that noted on prior CT. No acute abnormality is noted. Electronically Signed   By: Inez Catalina M.D.   On: 01/16/2021 00:50    Pertinent labs & imaging results that were available during my care of the patient were reviewed by me and considered in my medical decision making (see MDM for details).  Medications  Ordered in ED Medications  furosemide (LASIX) injection 20 mg (20 mg Intravenous Given 01/16/21 0233)                                                                                                                                     Procedures Procedures  (including critical care time)  Medical Decision Making / ED Course I have reviewed the nursing notes for this encounter and the patient's prior records (if available in EHR or on provided paperwork).  NAYTHAN DOUTHIT was evaluated in Emergency Department on 01/16/2021 for the symptoms described in the history of present illness. He was evaluated in the context of the global COVID-19 pandemic, which necessitated consideration that the patient might be at risk for infection with the SARS-CoV-2 virus that causes COVID-19. Institutional protocols and algorithms that pertain to the evaluation of patients at risk for COVID-19 are in a state of rapid change based on information released by regulatory bodies including the CDC  and federal and state organizations. These policies and algorithms were followed during the patient's care in the ED.     Patient is satting well on room air. In AF with controlled rate. Earlier labs review and noted above. Will add BNP and Trop to assess for CHF exacerbation BMP reassuring, the patient does have evidence of volume overload on exam.  Given low-dose IV Lasix. Troponin negative. Chest x-ray with stable left base infiltrate.   Pertinent labs & imaging results that were available during my care of the patient were reviewed by me and considered in my medical decision making:  After lasix, patient felt better. Ambulated w/o complication. Offered to get transfusion in the ED, but he wanted to be discharged and go to the infusion clinic in the morning as scheduled. Requested that he speak with his doctor regarding being placed back on lasix.  Final Clinical Impression(s) / ED Diagnoses Final diagnoses:  SOB (shortness of breath)  Peripheral edema   The patient appears reasonably screened and/or stabilized for discharge and I doubt any other medical condition or other [facility] requiring further screening, evaluation, or treatment in the ED at this time prior to discharge. Safe for discharge with strict return precautions.  Disposition: Discharge  Condition: Good  I have discussed the results, Dx and Tx plan with the patient/family who expressed understanding and agree(s) with the plan. Discharge instructions discussed at length. The patient/family was given strict return precautions who verbalized understanding of the instructions. No further questions at time of discharge.    ED Discharge Orders     None         Follow Up: Hem/Onc  Today as scheduled for your transfusion     This chart was dictated using voice recognition software.  Despite best efforts to proofread,  errors can occur which can change the documentation meaning.    Fatima Blank,  MD 01/16/21 [phone]

## 2021-01-16 NOTE — Patient Instructions (Signed)
Blood Transfusion, Adult, Care After This sheet gives you information about how to care for yourself after your procedure. Your doctor may also give you more specific instructions. If you have problems or questions, contact your doctor. What can I expect after the procedure? After the procedure, it is common to have: Bruising and soreness at the IV site. A headache. Follow these instructions at home: Insertion site care   Follow instructions from your doctor about how to take care of your insertion site. This is where an IV tube was put into your vein. Make sure you: Wash your hands with soap and water before and after you change your bandage (dressing). If you cannot use soap and water, use hand sanitizer. Change your bandage as told by your doctor. Check your insertion site every day for signs of infection. Check for: Redness, swelling, or pain. Bleeding from the site. Warmth. Pus or a bad smell. General instructions Take over-the-counter and prescription medicines only as told by your doctor. Rest as told by your doctor. Go back to your normal activities as told by your doctor. Keep all follow-up visits as told by your doctor. This is important. Contact a doctor if: You have itching or red, swollen areas of skin (hives). You feel worried or nervous (anxious). You feel weak after doing your normal activities. You have redness, swelling, warmth, or pain around the insertion site. You have blood coming from the insertion site, and the blood does not stop with pressure. You have pus or a bad smell coming from the insertion site. Get help right away if: You have signs of a serious reaction. This may be coming from an allergy or the body's defense system (immune system). Signs include: Trouble breathing or shortness of breath. Swelling of the face or feeling warm (flushed). Fever or chills. Head, chest, or back pain. Dark pee (urine) or blood in the pee. Widespread rash. Fast  heartbeat. Feeling dizzy or light-headed. You may receive your blood transfusion in an outpatient setting. If so, you will be told whom to contact to report any reactions. These symptoms may be an emergency. Do not wait to see if the symptoms will go away. Get medical help right away. Call your local emergency services (911 in the [place] Do not drive yourself to the hospital. Summary Bruising and soreness at the IV site are common. Check your insertion site every day for signs of infection. Rest as told by your doctor. Go back to your normal activities as told by your doctor. Get help right away if you have signs of a serious reaction. This information is not intended to replace advice given to you by your health care provider. Make sure you discuss any questions you have with your health care provider. Document Revised: 05/08/2020 Document Reviewed: 07/06/2018 Elsevier Patient Education  2022 [employer]  

## 2021-01-16 NOTE — ED Triage Notes (Signed)
Pt came in with c/o SOB. Pt getting treatment for lung cancer. Pt last treatment was Wed prior to Thanksgiving. Pt states the SOB started two hours ago. Pt states it is worse when lying down

## 2021-01-18 ENCOUNTER — Emergency Department (HOSPITAL_COMMUNITY)
Admission: EM | Admit: 2021-01-18 | Discharge: 2021-01-18 | Disposition: A | Discharge status: Home / Self Care | Payer: Medicare Other | Attending: Emergency Medicine | Admitting: Emergency Medicine

## 2021-01-18 ENCOUNTER — Emergency Department (HOSPITAL_COMMUNITY): Payer: Medicare Other

## 2021-01-18 ENCOUNTER — Encounter (HOSPITAL_COMMUNITY): Payer: Self-pay

## 2021-01-18 DIAGNOSIS — D61818 Other pancytopenia: Secondary | ICD-10-CM | POA: Diagnosis not present

## 2021-01-18 DIAGNOSIS — I4891 Unspecified atrial fibrillation: Secondary | ICD-10-CM | POA: Insufficient documentation

## 2021-01-18 DIAGNOSIS — F17210 Nicotine dependence, cigarettes, uncomplicated: Secondary | ICD-10-CM | POA: Diagnosis not present

## 2021-01-18 DIAGNOSIS — Z20822 Contact with and (suspected) exposure to covid-19: Secondary | ICD-10-CM | POA: Insufficient documentation

## 2021-01-18 DIAGNOSIS — J441 Chronic obstructive pulmonary disease with (acute) exacerbation: Secondary | ICD-10-CM | POA: Diagnosis not present

## 2021-01-18 DIAGNOSIS — R0602 Shortness of breath: Secondary | ICD-10-CM | POA: Diagnosis not present

## 2021-01-18 DIAGNOSIS — I10 Essential (primary) hypertension: Secondary | ICD-10-CM | POA: Insufficient documentation

## 2021-01-18 DIAGNOSIS — R9431 Abnormal electrocardiogram [ECG] [EKG]: Secondary | ICD-10-CM | POA: Diagnosis not present

## 2021-01-18 DIAGNOSIS — C3490 Malignant neoplasm of unspecified part of unspecified bronchus or lung: Secondary | ICD-10-CM | POA: Diagnosis not present

## 2021-01-18 DIAGNOSIS — Z79899 Other long term (current) drug therapy: Secondary | ICD-10-CM | POA: Insufficient documentation

## 2021-01-18 DIAGNOSIS — Z7901 Long term (current) use of anticoagulants: Secondary | ICD-10-CM | POA: Diagnosis not present

## 2021-01-18 DIAGNOSIS — J439 Emphysema, unspecified: Secondary | ICD-10-CM | POA: Diagnosis not present

## 2021-01-18 LAB — CBC WITH DIFFERENTIAL/PLATELET
Abs Immature Granulocytes: 0.02 10*3/uL (ref 0.00–0.07)
Basophils Absolute: 0 10*3/uL (ref 0.0–0.1)
Basophils Relative: 0 %
Eosinophils Absolute: 0 10*3/uL (ref 0.0–0.5)
Eosinophils Relative: 0 %
HCT: 24.8 % — ABNORMAL LOW (ref 39.0–52.0)
Hemoglobin: 8.2 g/dL — ABNORMAL LOW (ref 13.0–17.0)
Immature Granulocytes: 1 %
Lymphocytes Relative: 22 %
Lymphs Abs: 0.6 10*3/uL — ABNORMAL LOW (ref 0.7–4.0)
MCH: 32.7 pg (ref 26.0–34.0)
MCHC: 33.1 g/dL (ref 30.0–36.0)
MCV: 98.8 fL (ref 80.0–100.0)
Monocytes Absolute: 0.2 10*3/uL (ref 0.1–1.0)
Monocytes Relative: 7 %
Neutro Abs: 2 10*3/uL (ref 1.7–7.7)
Neutrophils Relative %: 70 %
Platelets: 106 10*3/uL — ABNORMAL LOW (ref 150–400)
RBC: 2.51 MIL/uL — ABNORMAL LOW (ref 4.22–5.81)
RDW: 19.7 % — ABNORMAL HIGH (ref 11.5–15.5)
WBC: 2.9 10*3/uL — ABNORMAL LOW (ref 4.0–10.5)
nRBC: 0 % (ref 0.0–0.2)

## 2021-01-18 LAB — COMPREHENSIVE METABOLIC PANEL
ALT: 23 U/L (ref 0–44)
AST: 23 U/L (ref 15–41)
Albumin: 3.5 g/dL (ref 3.5–5.0)
Alkaline Phosphatase: 100 U/L (ref 38–126)
Anion gap: 6 (ref 5–15)
BUN: 39 mg/dL — ABNORMAL HIGH (ref 8–23)
CO2: 30 mmol/L (ref 22–32)
Calcium: 9 mg/dL (ref 8.9–10.3)
Chloride: 103 mmol/L (ref 98–111)
Creatinine, Ser: 1.1 mg/dL (ref 0.61–1.24)
GFR, Estimated: >60 mL/min (ref >60)
Glucose, Bld: 95 mg/dL (ref 70–99)
Potassium: 3.5 mmol/L (ref 3.5–5.1)
Sodium: 139 mmol/L (ref 135–145)
Total Bilirubin: 1 mg/dL (ref 0.3–1.2)
Total Protein: 6.6 g/dL (ref 6.5–8.1)

## 2021-01-18 LAB — TYPE AND SCREEN
ABO/RH(D): O POS
ABO/RH(D): O POS
Antibody Screen: NEGATIVE
Antibody Screen: NEGATIVE
Unit division: 0

## 2021-01-18 LAB — RESP PANEL BY RT-PCR (FLU A&B, COVID) ARPGX2
Influenza A by PCR: NEGATIVE
Influenza B by PCR: NEGATIVE
SARS Coronavirus 2 by RT PCR: NEGATIVE

## 2021-01-18 LAB — BPAM RBC
Blood Product Expiration Date: 202301142359
ISSUE DATE / TIME: 202212231155
Unit Type and Rh: 5100

## 2021-01-18 LAB — TROPONIN I (HIGH SENSITIVITY): Troponin I (High Sensitivity): 8 ng/L (ref <18)

## 2021-01-18 LAB — BRAIN NATRIURETIC PEPTIDE: B Natriuretic Peptide: 195.9 pg/mL — ABNORMAL HIGH (ref 0.0–100.0)

## 2021-01-18 MED ORDER — PREDNISONE 20 MG PO TABS
60.0000 mg | ORAL_TABLET | Freq: Once | ORAL | Status: AC
  Administered 2021-01-18: 08:00:00 60 mg via ORAL
  Filled 2021-01-18: qty 3

## 2021-01-18 MED ORDER — PREDNISONE 50 MG PO TABS: 50.0000 mg | ORAL_TABLET | Freq: Every day | ORAL | 0 refills | Status: DC

## 2021-01-18 MED ORDER — IPRATROPIUM-ALBUTEROL 0.5-2.5 (3) MG/3ML IN SOLN
3.0000 mL | Freq: Once | RESPIRATORY_TRACT | Status: AC
  Administered 2021-01-18: 06:00:00 3 mL via RESPIRATORY_TRACT
  Filled 2021-01-18: qty 3

## 2021-01-18 NOTE — ED Triage Notes (Signed)
Pt presents with c/o SHOB. Pt is currently receiving treatment for Lung CA. Pt states SHOB began 3 hours ago. Pt has hx of A.Fib. Pt is 100% on RA in triage.

## 2021-01-18 NOTE — ED Notes (Signed)
Pt Ambulated with O2 sensor. O2 between 93 - 97% with good pleth

## 2021-01-18 NOTE — ED Notes (Signed)
Pt transported to Xray. 

## 2021-01-18 NOTE — ED Notes (Signed)
Dr. Glick at bedside.  

## 2021-01-18 NOTE — Discharge Instructions (Addendum)
Continue using your inhalers at home.  Return to the emergency department if breathing is getting worse.

## 2021-01-18 NOTE — ED Provider Notes (Signed)
[facility] DEPT Provider Note   CSN: [id number] Arrival date & time: 01/18/21  0416     History Chief Complaint  Patient presents with   Shortness of Breath    Joe Hill is a 80 y.o. male.  The history is provided by the patient.  Shortness of Breath He has history of hypertension, hyperglycemia, atrial fibrillation anticoagulated with apixaban, lung cancer currently undergoing chemotherapy and comes in because shortness of breath which started this evening.  He had been seen in the emergency department 2 days ago with similar complaints and was given a dose of furosemide and had been doing better until tonight.  He denies chest pain, heaviness, tightness, pressure.  He has had a cough productive of green sputum.  He denies fever, chills, sweats.  He has used his inhaler which has given some temporary improvement.  He also has noted swelling of his legs up without any pain in his legs.  He denies chest pain, heaviness, tightness, pressure.  He is does admit to having had an increase in his salt consumption tonight.   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   TOBACCO USE 01/30/2007    Patient Active Problem List   Diagnosis Date Noted   PNA (pneumonia) 01/11/2021   Hypotension 01/02/2021   Chemotherapy-induced neutropenia (HCC)    Syncope and collapse 12/26/2020   Pancytopenia ([place]) 12/26/2020   Hypokalemia 12/26/2020   Borderline prolonged QT interval 12/26/2020   Pneumonia 12/26/2020   Pulmonary embolism ([place]) 12/26/2020   Atrial fibrillation with RVR ([place]) 12/26/2020   Encounter for antineoplastic chemotherapy 11/18/2020   Encounter for antineoplastic immunotherapy 11/18/2020    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   Aortic atherosclerosis ([place]) 10/28/2020   Dyslipidemia 10/23/2016   Abnormal CT scan, chest 09/07/2010   EMPHYSEMA 10/16/2009   GERD 09/09/2009   Hyperglycemia 08/12/2009   Essential hypertension 08/04/2007   TOBACCO USE 01/30/2007   PROSTATE CANCER, 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/11/2006    Past Surgical History:  Procedure Laterality Date   APPENDECTOMY     CATARACT EXTRACTION     PROSTATE SURGERY     prostatectomy   VIDEO BRONCHOSCOPY WITH ENDOBRONCHIAL NAVIGATION N/A 10/30/2020   Procedure: VIDEO BRONCHOSCOPY WITH ENDOBRONCHIAL NAVIGATION;  Surgeon: Melrose Nakayama, MD;  Location: [facility] OR;  Service: Thoracic;  Laterality: N/A;   VIDEO BRONCHOSCOPY WITH ENDOBRONCHIAL ULTRASOUND N/A 10/30/2020   Procedure: VIDEO BRONCHOSCOPY WITH ENDOBRONCHIAL ULTRASOUND;  Surgeon: Melrose Nakayama, MD;  Location: [facility] OR;  Service: Thoracic;  Laterality: N/A;       Family History  Problem Relation Age of Onset   Hyperlipidemia Mother    Cancer Father        lung, smoker   Cancer Sister        breast, smoker   Arthritis Maternal Aunt     Social History   Tobacco Use   Smoking status: Every Day    Packs/day: 0.25    Types: Cigarettes   Smokeless tobacco: Never  Vaping Use   Vaping Use: Never used  Substance Use Topics   Alcohol use: No    Alcohol/week: 0.0 standard drinks   Drug use: No    Home Medications  Prior to Admission medications   Medication Sig Start Date End Date Taking? Authorizing Provider  albuterol (VENTOLIN HFA) 108 (90 Base) MCG/ACT inhaler TAKE 2 PUFFS BY MOUTH EVERY 6 HOURS AS NEEDED FOR WHEEZE OR SHORTNESS OF BREATH 01/07/21   [name], MD  apixaban (ELIQUIS) 5 MG TABS tablet Take 5 mg by mouth 2 (two) times daily.    [provider]  cefdinir (OMNICEF) 300 MG capsule Take 1 capsule (300 mg total) by mouth 2 (two) times daily for 7 days. 01/12/21 01/19/21  Antonieta Pert, MD   dextromethorphan-guaiFENesin (MUCINEX DM) 30-600 MG 12hr tablet Take 1 tablet by mouth 2 (two) times daily as needed for cough.    [provider]  feeding supplement (BOOST HIGH PROTEIN) LIQD Take 1 Container by mouth 2 (two) times daily as needed (nutrition).    [provider]  folic acid (FOLVITE) 1 MG tablet Take 1 tablet (1 mg total) by mouth daily. 11/18/20   Curt Bears, MD  furosemide (LASIX) 40 MG tablet Take 1 tablet (40 mg total) by mouth daily for 7 days. 01/02/21 01/09/21  [place] Donnamarie Poag, DO  pantoprazole (PROTONIX) 40 MG tablet Take 1 tablet (40 mg total) by mouth daily. 01/02/21 04/02/21  [place] Donnamarie Poag, DO  potassium chloride SA (KLOR-CON) 20 MEQ tablet Take 1 tablet (20 mEq total) by mouth daily. Take 2 tablets (45meq) for 2 days then take 1 tablet (33meq) daily Patient taking differently: Take 20 mEq by mouth daily. 11/17/20   Shirley Friar, PA-C  prochlorperazine (COMPAZINE) 10 MG tablet Take 1 tablet (10 mg total) by mouth every 6 (six) hours as needed for nausea or vomiting. 11/18/20   Curt Bears, MD    Allergies    Penicillins  Review of Systems   Review of Systems  Respiratory:  Positive for shortness of breath.   All other systems reviewed and are negative.  Physical Exam Updated Vital Signs BP 106/63    Pulse 97    Temp (!) 97.5 F (36.4 C)    Resp 20    Ht 5\' 10"  (1.778 m)    Wt 79.4 kg    SpO2 100%    BMI 25.11 kg/m   Physical Exam Vitals and nursing note reviewed.  80 year old male, resting comfortably and in no acute distress. Vital signs are normal. Oxygen saturation is 100%, which is normal. Head is normocephalic and atraumatic. PERRLA, EOMI. Oropharynx is clear. Neck is nontender and supple without adenopathy or JVD. Back is nontender and there is no CVA tenderness. Lungs have markedly diminished airflow with a few scattered wheezes bilaterally.  No rales or rhonchi are appreciated. Chest is nontender. Heart has  regular rate and rhythm without murmur. Abdomen is soft, flat, nontender without masses or hepatosplenomegaly and peristalsis is normoactive. Extremities: There is 2-3+ edema of the left lower leg, 1+ edema of the right lower leg.  Left calf circumference is 2 cm greater than right calf circumference.  There is no calf tenderness and Homans' sign is negative.  There is no palpable cord. Skin is warm and dry without rash. Neurologic: Mental status is normal, cranial nerves are intact, moves all extremities equally.  ED Results / Procedures / Treatments   Labs (all labs ordered are listed, but only abnormal results are displayed) Labs Reviewed  COMPREHENSIVE METABOLIC PANEL  BRAIN NATRIURETIC PEPTIDE  CBC WITH DIFFERENTIAL/PLATELET  TYPE AND SCREEN  TROPONIN I (HIGH SENSITIVITY)    EKG EKG Interpretation  Date/Time:  Sunday January 18 2021 04:37:16 EST Ventricular Rate:  89 PR Interval:    QRS Duration: 92 QT Interval:  345 QTC Calculation: 420 R Axis:   53 Text Interpretation: Atrial fibrillation Borderline low voltage, extremity leads Abnormal R-wave progression, early transition Minimal ST depression, lateral leads When compared with ECG of 01/16/2021, No significant change was found Confirmed by Delora Fuel ([id number]) on 01/18/2021 4:41:13 AM  Radiology DG Chest 2 View  Result Date: 01/18/2021 CLINICAL DATA:  Shortness of breath. Currently receiving treatment for lung cancer. EXAM: CHEST - 2 VIEW COMPARISON:  01/16/2021. FINDINGS: The heart size and mediastinal contours are stable. There is a lobular density along the medial aspect of the right heart border, compatible with known pulmonary nodule. Emphysematous changes are present in the lungs bilaterally. There is chronic elevation of the left diaphragm with atelectasis or infiltrate at the left lung base. No effusion or. Degenerative changes are present in the thoracic spine. IMPRESSION: Stable chest with no acute cardiopulmonary  process. Electronically Signed   By: Brett Fairy M.D.   On: 01/18/2021 04:51    Procedures Procedures   Medications Ordered in ED Medications  predniSONE (DELTASONE) tablet 60 mg (has no administration in time range)  ipratropium-albuterol (DUONEB) 0.5-2.5 (3) MG/3ML nebulizer solution 3 mL (3 mLs Nebulization Given 01/18/21 0601)    ED Course  I have reviewed the triage vital signs and the nursing notes.  Pertinent labs & imaging results that were available during my care of the patient were reviewed by me and considered in my medical decision making (see chart for details).   MDM Rules/Calculators/A&P                         Shortness of breath which appears to be combination of COPD and heart failure.  He does have significant peripheral edema, although it seems decreased from what was described at an ED visit 2 days ago.  He had not had his prescription for furosemide filled, and I suspect he mainly needs dose of furosemide.  Chest x-ray shows no obvious pneumonia.  We will check screening labs including BNP and troponin.  BNP had been slightly elevated at his previous ED visit.  Doubt DVT and pulmonary embolism in the setting of chronic anticoagulation on apixaban.  Labs show pancytopenia with WBC and platelets decreased compared with 12/22, but not to a dangerous level.  Patient is feeling better following nebulizer treatment and does wish to go home.  He will be ambulated in the emergency department to check to see if he has any significant oxygen desaturation.  Case is signed out to Dr. Zenia Resides.  Anticipate discharge with a steroid burst.  Final Clinical Impression(s) / ED Diagnoses Final diagnoses:  COPD exacerbation ([place])  Pancytopenia ([place])    Rx / DC Orders ED Discharge Orders     None        Delora Fuel, MD 27/51/70 [phone]

## 2021-01-19 ENCOUNTER — Other Ambulatory Visit: Payer: Self-pay

## 2021-01-19 ENCOUNTER — Emergency Department (HOSPITAL_BASED_OUTPATIENT_CLINIC_OR_DEPARTMENT_OTHER): Payer: Medicare Other

## 2021-01-19 ENCOUNTER — Emergency Department (HOSPITAL_COMMUNITY)
Admission: EM | Admit: 2021-01-19 | Discharge: 2021-01-19 | Disposition: A | Discharge status: Home / Self Care | Payer: Medicare Other | Attending: Emergency Medicine | Admitting: Emergency Medicine

## 2021-01-19 ENCOUNTER — Encounter (HOSPITAL_COMMUNITY): Payer: Self-pay

## 2021-01-19 DIAGNOSIS — J449 Chronic obstructive pulmonary disease, unspecified: Secondary | ICD-10-CM | POA: Diagnosis not present

## 2021-01-19 DIAGNOSIS — Z85118 Personal history of other malignant neoplasm of bronchus and lung: Secondary | ICD-10-CM | POA: Diagnosis not present

## 2021-01-19 DIAGNOSIS — I10 Essential (primary) hypertension: Secondary | ICD-10-CM | POA: Insufficient documentation

## 2021-01-19 DIAGNOSIS — Z79899 Other long term (current) drug therapy: Secondary | ICD-10-CM | POA: Insufficient documentation

## 2021-01-19 DIAGNOSIS — E860 Dehydration: Secondary | ICD-10-CM | POA: Insufficient documentation

## 2021-01-19 DIAGNOSIS — Z8546 Personal history of malignant neoplasm of prostate: Secondary | ICD-10-CM | POA: Diagnosis not present

## 2021-01-19 DIAGNOSIS — Z7901 Long term (current) use of anticoagulants: Secondary | ICD-10-CM | POA: Diagnosis not present

## 2021-01-19 DIAGNOSIS — F17210 Nicotine dependence, cigarettes, uncomplicated: Secondary | ICD-10-CM | POA: Insufficient documentation

## 2021-01-19 DIAGNOSIS — R609 Edema, unspecified: Secondary | ICD-10-CM | POA: Diagnosis not present

## 2021-01-19 DIAGNOSIS — I4891 Unspecified atrial fibrillation: Secondary | ICD-10-CM | POA: Diagnosis not present

## 2021-01-19 DIAGNOSIS — R600 Localized edema: Secondary | ICD-10-CM | POA: Insufficient documentation

## 2021-01-19 LAB — CBC WITH DIFFERENTIAL/PLATELET
Abs Immature Granulocytes: 0.02 10*3/uL (ref 0.00–0.07)
Basophils Absolute: 0 10*3/uL (ref 0.0–0.1)
Basophils Relative: 0 %
Eosinophils Absolute: 0 10*3/uL (ref 0.0–0.5)
Eosinophils Relative: 0 %
HCT: 24.3 % — ABNORMAL LOW (ref 39.0–52.0)
Hemoglobin: 8.1 g/dL — ABNORMAL LOW (ref 13.0–17.0)
Immature Granulocytes: 1 %
Lymphocytes Relative: 12 %
Lymphs Abs: 0.5 10*3/uL — ABNORMAL LOW (ref 0.7–4.0)
MCH: 32.8 pg (ref 26.0–34.0)
MCHC: 33.3 g/dL (ref 30.0–36.0)
MCV: 98.4 fL (ref 80.0–100.0)
Monocytes Absolute: 0.3 10*3/uL (ref 0.1–1.0)
Monocytes Relative: 8 %
Neutro Abs: 3.3 10*3/uL (ref 1.7–7.7)
Neutrophils Relative %: 79 %
Platelets: 87 10*3/uL — ABNORMAL LOW (ref 150–400)
RBC: 2.47 MIL/uL — ABNORMAL LOW (ref 4.22–5.81)
RDW: 19.6 % — ABNORMAL HIGH (ref 11.5–15.5)
WBC: 4.1 10*3/uL (ref 4.0–10.5)
nRBC: 0 % (ref 0.0–0.2)

## 2021-01-19 LAB — COMPREHENSIVE METABOLIC PANEL
ALT: 25 U/L (ref 0–44)
AST: 24 U/L (ref 15–41)
Albumin: 3.7 g/dL (ref 3.5–5.0)
Alkaline Phosphatase: 107 U/L (ref 38–126)
Anion gap: 9 (ref 5–15)
BUN: 42 mg/dL — ABNORMAL HIGH (ref 8–23)
CO2: 27 mmol/L (ref 22–32)
Calcium: 8.9 mg/dL (ref 8.9–10.3)
Chloride: 101 mmol/L (ref 98–111)
Creatinine, Ser: 1.08 mg/dL (ref 0.61–1.24)
GFR, Estimated: >60 mL/min (ref >60)
Glucose, Bld: 153 mg/dL — ABNORMAL HIGH (ref 70–99)
Potassium: 4.1 mmol/L (ref 3.5–5.1)
Sodium: 137 mmol/L (ref 135–145)
Total Bilirubin: 1.2 mg/dL (ref 0.3–1.2)
Total Protein: 6.8 g/dL (ref 6.5–8.1)

## 2021-01-19 NOTE — ED Triage Notes (Signed)
Pt states that he is dehydrated. Pt reports eating and drinking as normal.

## 2021-01-19 NOTE — ED Provider Notes (Signed)
[name] DEPT Provider Note   CSN: [id number] Arrival date & time: 01/19/21  5284     History Chief Complaint  Patient presents with   Dehydration    Joe Hill is a 80 y.o. male.  The history is provided by the patient and medical records.  Joe Hill is a 80 y.o. male who presents to the Emergency Department complaining of "I think I'm dehydrated."  He presents to the ED stating he thinks he is dehydrated because he has not felt well for several days.  He reports poor sleep, feeling restless.  He is drinking fluids and eating.  He does have to focus on drinking cold fluids because sometimes when he drinks room temperature water tries to go down the wrong pipe.  No fevers, nausea, vomiting, chest pain.  He also reports about 1 week of left lower extremity aching and swelling.  He is on Eliquis for history of atrial fibrillation.  He was started on furosemide for lower extremity edema recently.    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   TOBACCO USE 01/30/2007    Patient Active Problem List   Diagnosis Date Noted   PNA (pneumonia) 01/11/2021   Hypotension 01/02/2021   Chemotherapy-induced neutropenia (HCC)    Syncope and collapse 12/26/2020   Pancytopenia ([place]) 12/26/2020   Hypokalemia 12/26/2020   Borderline prolonged QT interval 12/26/2020   Pneumonia 12/26/2020   Pulmonary embolism ([place]) 12/26/2020   Atrial fibrillation with RVR ([place]) 12/26/2020   Encounter for antineoplastic chemotherapy 11/18/2020   Encounter for antineoplastic immunotherapy 11/18/2020   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   Aortic atherosclerosis ([place]) 10/28/2020   Dyslipidemia 10/23/2016    Abnormal CT scan, chest 09/07/2010   EMPHYSEMA 10/16/2009   GERD 09/09/2009   Hyperglycemia 08/12/2009   Essential hypertension 08/04/2007   TOBACCO USE 01/30/2007   PROSTATE CANCER, 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/11/2006    Past Surgical History:  Procedure Laterality Date   APPENDECTOMY     CATARACT EXTRACTION     PROSTATE SURGERY     prostatectomy   VIDEO BRONCHOSCOPY WITH ENDOBRONCHIAL NAVIGATION N/A 10/30/2020   Procedure: VIDEO BRONCHOSCOPY WITH ENDOBRONCHIAL NAVIGATION;  Surgeon: Melrose Nakayama, MD;  Location: [facility] OR;  Service: Thoracic;  Laterality: N/A;   VIDEO BRONCHOSCOPY WITH ENDOBRONCHIAL ULTRASOUND N/A 10/30/2020   Procedure: VIDEO BRONCHOSCOPY WITH ENDOBRONCHIAL ULTRASOUND;  Surgeon: Melrose Nakayama, MD;  Location: [facility] OR;  Service: Thoracic;  Laterality: N/A;       Family History  Problem Relation Age of Onset   Hyperlipidemia Mother    Cancer Father        lung, smoker   Cancer Sister        breast, smoker   Arthritis Maternal Aunt     Social History   Tobacco Use   Smoking status: Every Day    Packs/day: 0.25    Types: Cigarettes   Smokeless tobacco: Never  Vaping Use   Vaping Use: Never used  Substance Use Topics   Alcohol use: No    Alcohol/week: 0.0 standard drinks   Drug use: No    Home Medications Prior to Admission medications   Medication Sig Start Date  End Date Taking? Authorizing Provider  albuterol (VENTOLIN HFA) 108 (90 Base) MCG/ACT inhaler TAKE 2 PUFFS BY MOUTH EVERY 6 HOURS AS NEEDED FOR WHEEZE OR SHORTNESS OF BREATH Patient taking differently: 2 puffs every 6 (six) hours as needed for wheezing or shortness of breath. 01/07/21   [name], MD  apixaban (ELIQUIS) 5 MG TABS tablet Take 5 mg by mouth 2 (two) times daily.    [provider]  cefdinir (OMNICEF) 300 MG capsule Take 1 capsule (300 mg total) by mouth 2 (two) times daily for 7 days. 01/12/21 01/19/21  Antonieta Pert, MD   dextromethorphan-guaiFENesin (MUCINEX DM) 30-600 MG 12hr tablet Take 1 tablet by mouth 2 (two) times daily as needed for cough.    [provider]  feeding supplement (BOOST HIGH PROTEIN) LIQD Take 1 Container by mouth 2 (two) times daily as needed (nutrition).    [provider]  folic acid (FOLVITE) 1 MG tablet Take 1 tablet (1 mg total) by mouth daily. 11/18/20   Curt Bears, MD  furosemide (LASIX) 40 MG tablet Take 1 tablet (40 mg total) by mouth daily for 7 days. Patient not taking: Reported on 01/18/2021 01/02/21 01/09/21  [place] Eric J, DO  pantoprazole (PROTONIX) 40 MG tablet Take 1 tablet (40 mg total) by mouth daily. Patient taking differently: Take 40 mg by mouth daily as needed (for acid reflux). 01/02/21 04/02/21  [place] Donnamarie Poag, DO  potassium chloride SA (KLOR-CON) 20 MEQ tablet Take 1 tablet (20 mEq total) by mouth daily. Take 2 tablets (4meq) for 2 days then take 1 tablet (72meq) daily Patient not taking: Reported on 01/18/2021 11/17/20   Shirley Friar, PA-C  predniSONE (DELTASONE) 50 MG tablet Take 1 tablet (50 mg total) by mouth daily. 97/98/92   Delora Fuel, MD  prochlorperazine (COMPAZINE) 10 MG tablet Take 1 tablet (10 mg total) by mouth every 6 (six) hours as needed for nausea or vomiting. 11/18/20   Curt Bears, MD    Allergies    Penicillins  Review of Systems   Review of Systems  All other systems reviewed and are negative.  Physical Exam Updated Vital Signs BP 103/60    Pulse 92    Temp 98.3 F (36.8 C) (Axillary)    Resp 20    SpO2 100%   Physical Exam Vitals and nursing note reviewed.  Constitutional:      Appearance: He is well-developed.  HENT:     Head: Normocephalic and atraumatic.     Mouth/Throat:     Mouth: Mucous membranes are moist.     Pharynx: No oropharyngeal exudate or posterior oropharyngeal erythema.  Cardiovascular:     Rate and Rhythm: Normal rate and regular rhythm.     Heart sounds: No murmur  heard. Pulmonary:     Effort: Pulmonary effort is normal. No respiratory distress.     Breath sounds: Normal breath sounds.     Comments: Hoarse quiet voice, no stridor Abdominal:     Palpations: Abdomen is soft.     Tenderness: There is no abdominal tenderness. There is no guarding or rebound.  Musculoskeletal:        General: No tenderness.     Comments: 2+ pitting edema in the LLE  Skin:    General: Skin is warm and dry.  Neurological:     Mental Status: He is alert and oriented to person, place, and time.  Psychiatric:        Behavior: Behavior normal.  ED Results / Procedures / Treatments   Labs (all labs ordered are listed, but only abnormal results are displayed) Labs Reviewed  CBC WITH DIFFERENTIAL/PLATELET - Abnormal; Notable for the following components:      Result Value   RBC 2.47 (*)    Hemoglobin 8.1 (*)    HCT 24.3 (*)    RDW 19.6 (*)    Platelets 87 (*)    Lymphs Abs 0.5 (*)    All other components within normal limits  COMPREHENSIVE METABOLIC PANEL - Abnormal; Notable for the following components:   Glucose, Bld 153 (*)    BUN 42 (*)    All other components within normal limits    EKG None  Radiology DG Chest 2 View  Result Date: 01/18/2021 CLINICAL DATA:  Shortness of breath. Currently receiving treatment for lung cancer. EXAM: CHEST - 2 VIEW COMPARISON:  01/16/2021. FINDINGS: The heart size and mediastinal contours are stable. There is a lobular density along the medial aspect of the right heart border, compatible with known pulmonary nodule. Emphysematous changes are present in the lungs bilaterally. There is chronic elevation of the left diaphragm with atelectasis or infiltrate at the left lung base. No effusion or. Degenerative changes are present in the thoracic spine. IMPRESSION: Stable chest with no acute cardiopulmonary process. Electronically Signed   By: Brett Fairy M.D.   On: 01/18/2021 04:51    Procedures Procedures   Medications  Ordered in ED Medications - No data to display  ED Course  I have reviewed the triage vital signs and the nursing notes.  Pertinent labs & imaging results that were available during my care of the patient were reviewed by me and considered in my medical decision making (see chart for details).    MDM Rules/Calculators/A&P                         Patient with history of cancer currently undergoing chemotherapy here for evaluation of feeling dehydrated.  He does appear hydrated on evaluation.  He is drinking without difficulty at the bedside.  Labs with stable anemia.  BUN is mildly elevated when compared to priors.  Feel this might be secondary to overdiuresis with Lasix.  Recommend that he discontinue the Lasix at this time.  Do not feel he needs IV fluid hydration as he can drink without difficulty.  He does have asymmetric lower extremity edema.  He states that this has been present for over the last week.  He did have a DVT study performed on December 8.  Given worsening symptoms feel that he would benefit from a repeat DVT study.  Patient care transferred pending DVT study.    Final Clinical Impression(s) / ED Diagnoses Final diagnoses:  None    Rx / DC Orders ED Discharge Orders     None        Quintella Reichert, MD 01/19/21 [phone]

## 2021-01-19 NOTE — Progress Notes (Signed)
Left lower extremity venous duplex completed. Refer to "CV Proc" under chart review to view preliminary results.  01/19/2021 9:50 AM Kelby Aline., MHA, RVT, RDCS, RDMS

## 2021-01-19 NOTE — Discharge Instructions (Signed)
The ultrasound of your leg today was negative for blood clots.  Follow-up with your doctor

## 2021-01-19 NOTE — ED Provider Notes (Signed)
Patient signed to me by Dr. Ralene Bathe.  Results of lower extremity Doppler which is negative.  He is stable for discharge   Lacretia Leigh, MD 01/19/21 [phone]

## 2021-01-21 ENCOUNTER — Emergency Department (HOSPITAL_COMMUNITY)
Admission: EM | Admit: 2021-01-21 | Discharge: 2021-01-21 | Disposition: A | Discharge status: Home / Self Care | Payer: Medicare Other | Source: Home / Self Care | Attending: Emergency Medicine | Admitting: Emergency Medicine

## 2021-01-21 ENCOUNTER — Other Ambulatory Visit: Payer: Self-pay

## 2021-01-21 ENCOUNTER — Encounter (HOSPITAL_COMMUNITY): Payer: Self-pay | Admitting: Emergency Medicine

## 2021-01-21 DIAGNOSIS — I5023 Acute on chronic systolic (congestive) heart failure: Secondary | ICD-10-CM | POA: Diagnosis not present

## 2021-01-21 DIAGNOSIS — F17210 Nicotine dependence, cigarettes, uncomplicated: Secondary | ICD-10-CM | POA: Diagnosis not present

## 2021-01-21 DIAGNOSIS — J3800 Paralysis of vocal cords and larynx, unspecified: Secondary | ICD-10-CM | POA: Diagnosis not present

## 2021-01-21 DIAGNOSIS — D701 Agranulocytosis secondary to cancer chemotherapy: Secondary | ICD-10-CM | POA: Diagnosis not present

## 2021-01-21 DIAGNOSIS — Z86711 Personal history of pulmonary embolism: Secondary | ICD-10-CM | POA: Diagnosis not present

## 2021-01-21 DIAGNOSIS — C3492 Malignant neoplasm of unspecified part of left bronchus or lung: Secondary | ICD-10-CM | POA: Diagnosis not present

## 2021-01-21 DIAGNOSIS — I10 Essential (primary) hypertension: Secondary | ICD-10-CM | POA: Insufficient documentation

## 2021-01-21 DIAGNOSIS — J438 Other emphysema: Secondary | ICD-10-CM | POA: Diagnosis not present

## 2021-01-21 DIAGNOSIS — Z8546 Personal history of malignant neoplasm of prostate: Secondary | ICD-10-CM | POA: Diagnosis not present

## 2021-01-21 DIAGNOSIS — R040 Epistaxis: Secondary | ICD-10-CM | POA: Diagnosis not present

## 2021-01-21 DIAGNOSIS — Z803 Family history of malignant neoplasm of breast: Secondary | ICD-10-CM | POA: Diagnosis not present

## 2021-01-21 DIAGNOSIS — Z20822 Contact with and (suspected) exposure to covid-19: Secondary | ICD-10-CM | POA: Diagnosis not present

## 2021-01-21 DIAGNOSIS — D61818 Other pancytopenia: Secondary | ICD-10-CM | POA: Diagnosis not present

## 2021-01-21 DIAGNOSIS — I480 Paroxysmal atrial fibrillation: Secondary | ICD-10-CM | POA: Diagnosis not present

## 2021-01-21 DIAGNOSIS — Z8701 Personal history of pneumonia (recurrent): Secondary | ICD-10-CM | POA: Diagnosis not present

## 2021-01-21 DIAGNOSIS — L308 Other specified dermatitis: Secondary | ICD-10-CM | POA: Diagnosis present

## 2021-01-21 DIAGNOSIS — Z880 Allergy status to penicillin: Secondary | ICD-10-CM | POA: Diagnosis not present

## 2021-01-21 DIAGNOSIS — Z7901 Long term (current) use of anticoagulants: Secondary | ICD-10-CM | POA: Diagnosis not present

## 2021-01-21 DIAGNOSIS — J449 Chronic obstructive pulmonary disease, unspecified: Secondary | ICD-10-CM | POA: Insufficient documentation

## 2021-01-21 DIAGNOSIS — Z86010 Personal history of colonic polyps: Secondary | ICD-10-CM | POA: Diagnosis not present

## 2021-01-21 DIAGNOSIS — T451X5A Adverse effect of antineoplastic and immunosuppressive drugs, initial encounter: Secondary | ICD-10-CM | POA: Diagnosis not present

## 2021-01-21 DIAGNOSIS — J439 Emphysema, unspecified: Secondary | ICD-10-CM | POA: Diagnosis not present

## 2021-01-21 DIAGNOSIS — Z8349 Family history of other endocrine, nutritional and metabolic diseases: Secondary | ICD-10-CM | POA: Diagnosis not present

## 2021-01-21 DIAGNOSIS — I110 Hypertensive heart disease with heart failure: Secondary | ICD-10-CM | POA: Diagnosis not present

## 2021-01-21 DIAGNOSIS — D696 Thrombocytopenia, unspecified: Secondary | ICD-10-CM | POA: Diagnosis not present

## 2021-01-21 DIAGNOSIS — D61810 Antineoplastic chemotherapy induced pancytopenia: Secondary | ICD-10-CM | POA: Diagnosis not present

## 2021-01-21 MED ORDER — OXYMETAZOLINE HCL 0.05 % NA SOLN
1.0000 | Freq: Once | NASAL | Status: AC
  Administered 2021-01-21: 03:00:00 1 via NASAL
  Filled 2021-01-21: qty 30

## 2021-01-21 NOTE — ED Notes (Signed)
Patient nose has stop bleeding.

## 2021-01-21 NOTE — ED Triage Notes (Signed)
Patient is complaining of nose bleed that started around 2330.

## 2021-01-21 NOTE — ED Provider Notes (Signed)
[facility] DEPT Provider Note   CSN: [id number] Arrival date & time: 01/21/21  0133     History Chief Complaint  Patient presents with   Epistaxis    Joe Hill is a 80 y.o. male.  Patient presents to the emergency department with a chief complaint of nosebleed.  He states the symptoms started about 11:30 PM.  He denies any successful treatments PTA.  He has tried blowing his nose and using afrin in triage with good success.  He denies pain or trauma.  The history is provided by the patient. No language interpreter was used.      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   TOBACCO USE 01/30/2007    Patient Active Problem List   Diagnosis Date Noted   PNA (pneumonia) 01/11/2021   Hypotension 01/02/2021   Chemotherapy-induced neutropenia (HCC)    Syncope and collapse 12/26/2020   Pancytopenia ([place]) 12/26/2020   Hypokalemia 12/26/2020   Borderline prolonged QT interval 12/26/2020   Pneumonia 12/26/2020   Pulmonary embolism ([place]) 12/26/2020   Atrial fibrillation with RVR ([place]) 12/26/2020   Encounter for antineoplastic chemotherapy 11/18/2020   Encounter for antineoplastic immunotherapy 11/18/2020   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   Aortic atherosclerosis ([place]) 10/28/2020   Dyslipidemia 10/23/2016   Abnormal CT scan, chest 09/07/2010   EMPHYSEMA 10/16/2009   GERD 09/09/2009   Hyperglycemia 08/12/2009   Essential hypertension 08/04/2007   TOBACCO USE 01/30/2007   PROSTATE CANCER, 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/11/2006    Past Surgical History:  Procedure Laterality Date   APPENDECTOMY     CATARACT EXTRACTION     PROSTATE SURGERY      prostatectomy   VIDEO BRONCHOSCOPY WITH ENDOBRONCHIAL NAVIGATION N/A 10/30/2020   Procedure: VIDEO BRONCHOSCOPY WITH ENDOBRONCHIAL NAVIGATION;  Surgeon: Melrose Nakayama, MD;  Location: [facility] OR;  Service: Thoracic;  Laterality: N/A;   VIDEO BRONCHOSCOPY WITH ENDOBRONCHIAL ULTRASOUND N/A 10/30/2020   Procedure: VIDEO BRONCHOSCOPY WITH ENDOBRONCHIAL ULTRASOUND;  Surgeon: Melrose Nakayama, MD;  Location: [facility];  Service: Thoracic;  Laterality: N/A;       Family History  Problem Relation Age of Onset   Hyperlipidemia Mother    Cancer Father        lung, smoker   Cancer Sister        breast, smoker   Arthritis Maternal Aunt     Social History   Tobacco Use   Smoking status: Every Day    Packs/day: 0.25    Types: Cigarettes   Smokeless tobacco: Never  Vaping Use   Vaping Use: Never used  Substance Use Topics   Alcohol use: No    Alcohol/week: 0.0 standard drinks   Drug use: No    Home Medications Prior to Admission medications   Medication Sig Start Date End Date Taking? Authorizing Provider  albuterol (VENTOLIN HFA) 108 (90 Base) MCG/ACT inhaler TAKE 2 PUFFS BY MOUTH EVERY 6 HOURS AS NEEDED FOR WHEEZE OR SHORTNESS OF BREATH Patient taking differently: 2 puffs every 6 (six) hours as needed for wheezing or shortness of breath. 01/07/21   [name], MD  apixaban (ELIQUIS) 5 MG TABS tablet Take 5 mg by mouth 2 (two) times  daily.    [provider]  dextromethorphan-guaiFENesin (MUCINEX DM) 30-600 MG 12hr tablet Take 1 tablet by mouth 2 (two) times daily as needed for cough.    [provider]  feeding supplement (BOOST HIGH PROTEIN) LIQD Take 1 Container by mouth 2 (two) times daily as needed (nutrition).    [provider]  folic acid (FOLVITE) 1 MG tablet Take 1 tablet (1 mg total) by mouth daily. 11/18/20   Curt Bears, MD  furosemide (LASIX) 40 MG tablet Take 1 tablet (40 mg total) by mouth daily for 7 days. Patient not taking:  Reported on 01/18/2021 01/02/21 01/09/21  [place] Eric J, DO  pantoprazole (PROTONIX) 40 MG tablet Take 1 tablet (40 mg total) by mouth daily. Patient taking differently: Take 40 mg by mouth daily as needed (for acid reflux). 01/02/21 04/02/21  [place] Donnamarie Poag, DO  potassium chloride SA (KLOR-CON) 20 MEQ tablet Take 1 tablet (20 mEq total) by mouth daily. Take 2 tablets (76meq) for 2 days then take 1 tablet (77meq) daily Patient not taking: Reported on 01/18/2021 11/17/20   Shirley Friar, PA-C  predniSONE (DELTASONE) 50 MG tablet Take 1 tablet (50 mg total) by mouth daily. 16/10/96   Delora Fuel, MD  prochlorperazine (COMPAZINE) 10 MG tablet Take 1 tablet (10 mg total) by mouth every 6 (six) hours as needed for nausea or vomiting. 11/18/20   Curt Bears, MD    Allergies    Penicillins  Review of Systems   Review of Systems  Constitutional:  Negative for chills and fever.  HENT:  Positive for nosebleeds. Negative for facial swelling.    Physical Exam Updated Vital Signs BP 104/68    Pulse 67    Temp 97.7 F (36.5 C) (Oral)    Resp 14    Ht 5\' 10"  (1.778 m)    Wt 79.4 kg    SpO2 100%    BMI 25.11 kg/m   Physical Exam Vitals and nursing note reviewed.  Constitutional:      General: He is not in acute distress.    Appearance: He is well-developed. He is not ill-appearing.  HENT:     Head: Normocephalic and atraumatic.     Nose:     Comments: No active bleeding Eyes:     Conjunctiva/sclera: Conjunctivae normal.  Cardiovascular:     Rate and Rhythm: Normal rate.  Pulmonary:     Effort: Pulmonary effort is normal. No respiratory distress.  Abdominal:     General: There is no distension.  Musculoskeletal:     Cervical back: Neck supple.     Comments: Moves all extremities  Skin:    General: Skin is warm and dry.  Neurological:     Mental Status: He is alert and oriented to person, place, and time.  Psychiatric:        Mood and Affect: Mood normal.         Behavior: Behavior normal.    ED Results / Procedures / Treatments   Labs (all labs ordered are listed, but only abnormal results are displayed) Labs Reviewed - No data to display  EKG None  Radiology VAS [place] LOWER EXTREMITY VENOUS (DVT) (ONLY [facility])  Result Date: 01/19/2021  Lower Venous DVT Study Patient Name:  Joe Hill  Date of Exam:   01/19/2021 Medical Rec #: [id number]       Accession #:    [id number] Date of Birth: 1940-03-24       Patient Gender:  M Patient Age:   23 years Exam Location:  [facility] Procedure:      VAS [place] LOWER EXTREMITY VENOUS (DVT) Referring Phys: ELIZABETH REES --------------------------------------------------------------------------------  Indications: Edema.  Limitations: Poor ultrasound/tissue interface and body habitus. Comparison Study: 01/01/2021- negative lower extremity venous duplex Performing Technologist: Maudry Mayhew MHA, RDMS, RVT, RDCS  Examination Guidelines: A complete evaluation includes B-mode imaging, spectral Doppler, color Doppler, and power Doppler as needed of all accessible portions of each vessel. Bilateral testing is considered an integral part of a complete examination. Limited examinations for reoccurring indications may be performed as noted. The reflux portion of the exam is performed with the patient in reverse Trendelenburg.  +-----+---------------+---------+-----------+----------+--------------+  RIGHT Compressibility Phasicity Spontaneity Properties Thrombus Aging  +-----+---------------+---------+-----------+----------+--------------+  CFV   Full            Yes       Yes                                    +-----+---------------+---------+-----------+----------+--------------+   +---------+---------------+---------+-----------+----------+--------------+  LEFT      Compressibility Phasicity Spontaneity Properties Thrombus Aging  +---------+---------------+---------+-----------+----------+--------------+  CFV       Full             Yes       Yes                                    +---------+---------------+---------+-----------+----------+--------------+  SFJ       Full                                                             +---------+---------------+---------+-----------+----------+--------------+  FV Prox   Full                                                             +---------+---------------+---------+-----------+----------+--------------+  FV Mid    Full                                                             +---------+---------------+---------+-----------+----------+--------------+  FV Distal Full                                                             +---------+---------------+---------+-----------+----------+--------------+  PFV       Full                                                             +---------+---------------+---------+-----------+----------+--------------+  POP       Full            Yes       Yes                                    +---------+---------------+---------+-----------+----------+--------------+  PTV       Full                                                             +---------+---------------+---------+-----------+----------+--------------+  PERO      Full                                                             +---------+---------------+---------+-----------+----------+--------------+    Summary: RIGHT: - No evidence of common femoral vein obstruction.  LEFT: - There is no evidence of deep vein thrombosis in the lower extremity.  - No cystic structure found in the popliteal fossa.  *See table(s) above for measurements and observations. Electronically signed by Jamelle Haring on 01/19/2021 at 1:05:26 PM.    Final     Procedures Procedures   Medications Ordered in ED Medications  oxymetazoline (AFRIN) 0.05 % nasal spray 1 spray (1 spray Each Nare Given 01/21/21 0316)    ED Course  I have reviewed the triage vital signs and the nursing notes.  Pertinent labs &  imaging results that were available during my care of the patient were reviewed by me and considered in my medical decision making (see chart for details).    MDM Rules/Calculators/A&P                          Patient came to ED with CC of nosebleed.  Bleeding has stopped and patient is requesting discharge.  Discussed care of nosebleeds and avoid digital trauma.    Final Clinical Impression(s) / ED Diagnoses Final diagnoses:  Epistaxis    Rx / DC Orders ED Discharge Orders     None        Montine Circle, PA-C 01/21/21 2751    Maudie Flakes, MD 01/21/21 [phone]

## 2021-01-22 ENCOUNTER — Encounter (HOSPITAL_COMMUNITY): Payer: Self-pay

## 2021-01-22 ENCOUNTER — Other Ambulatory Visit: Payer: Self-pay

## 2021-01-22 ENCOUNTER — Inpatient Hospital Stay (HOSPITAL_COMMUNITY)
Admission: EM | Admit: 2021-01-22 | Discharge: 2021-01-25 | DRG: 808 | Disposition: A | Discharge status: Home / Self Care | Payer: Medicare Other | Attending: Internal Medicine | Admitting: Internal Medicine

## 2021-01-22 ENCOUNTER — Inpatient Hospital Stay: Payer: Medicare Other

## 2021-01-22 DIAGNOSIS — Z5111 Encounter for antineoplastic chemotherapy: Secondary | ICD-10-CM

## 2021-01-22 DIAGNOSIS — J439 Emphysema, unspecified: Secondary | ICD-10-CM | POA: Diagnosis present

## 2021-01-22 DIAGNOSIS — I110 Hypertensive heart disease with heart failure: Secondary | ICD-10-CM | POA: Diagnosis present

## 2021-01-22 DIAGNOSIS — F17210 Nicotine dependence, cigarettes, uncomplicated: Secondary | ICD-10-CM | POA: Diagnosis present

## 2021-01-22 DIAGNOSIS — D61818 Other pancytopenia: Secondary | ICD-10-CM | POA: Diagnosis present

## 2021-01-22 DIAGNOSIS — E876 Hypokalemia: Secondary | ICD-10-CM

## 2021-01-22 DIAGNOSIS — J3800 Paralysis of vocal cords and larynx, unspecified: Secondary | ICD-10-CM | POA: Diagnosis present

## 2021-01-22 DIAGNOSIS — I5023 Acute on chronic systolic (congestive) heart failure: Secondary | ICD-10-CM | POA: Diagnosis present

## 2021-01-22 DIAGNOSIS — D701 Agranulocytosis secondary to cancer chemotherapy: Secondary | ICD-10-CM | POA: Diagnosis present

## 2021-01-22 DIAGNOSIS — L308 Other specified dermatitis: Secondary | ICD-10-CM | POA: Diagnosis present

## 2021-01-22 DIAGNOSIS — Z20822 Contact with and (suspected) exposure to covid-19: Secondary | ICD-10-CM | POA: Diagnosis present

## 2021-01-22 DIAGNOSIS — Z8546 Personal history of malignant neoplasm of prostate: Secondary | ICD-10-CM

## 2021-01-22 DIAGNOSIS — Z8349 Family history of other endocrine, nutritional and metabolic diseases: Secondary | ICD-10-CM

## 2021-01-22 DIAGNOSIS — Z880 Allergy status to penicillin: Secondary | ICD-10-CM

## 2021-01-22 DIAGNOSIS — Z86711 Personal history of pulmonary embolism: Secondary | ICD-10-CM

## 2021-01-22 DIAGNOSIS — J438 Other emphysema: Secondary | ICD-10-CM | POA: Diagnosis present

## 2021-01-22 DIAGNOSIS — C3412 Malignant neoplasm of upper lobe, left bronchus or lung: Secondary | ICD-10-CM

## 2021-01-22 DIAGNOSIS — Z803 Family history of malignant neoplasm of breast: Secondary | ICD-10-CM

## 2021-01-22 DIAGNOSIS — D61810 Antineoplastic chemotherapy induced pancytopenia: Principal | ICD-10-CM | POA: Diagnosis present

## 2021-01-22 DIAGNOSIS — R040 Epistaxis: Secondary | ICD-10-CM | POA: Diagnosis not present

## 2021-01-22 DIAGNOSIS — C3492 Malignant neoplasm of unspecified part of left bronchus or lung: Secondary | ICD-10-CM | POA: Diagnosis present

## 2021-01-22 DIAGNOSIS — T451X5A Adverse effect of antineoplastic and immunosuppressive drugs, initial encounter: Secondary | ICD-10-CM | POA: Diagnosis present

## 2021-01-22 DIAGNOSIS — Z8701 Personal history of pneumonia (recurrent): Secondary | ICD-10-CM

## 2021-01-22 DIAGNOSIS — I480 Paroxysmal atrial fibrillation: Secondary | ICD-10-CM | POA: Diagnosis present

## 2021-01-22 DIAGNOSIS — Z86010 Personal history of colonic polyps: Secondary | ICD-10-CM

## 2021-01-22 DIAGNOSIS — D696 Thrombocytopenia, unspecified: Secondary | ICD-10-CM

## 2021-01-22 DIAGNOSIS — Z5112 Encounter for antineoplastic immunotherapy: Secondary | ICD-10-CM

## 2021-01-22 DIAGNOSIS — Z7901 Long term (current) use of anticoagulants: Secondary | ICD-10-CM

## 2021-01-22 LAB — CMP (CANCER CENTER ONLY)
ALT: 47 U/L — ABNORMAL HIGH (ref 0–44)
AST: 29 U/L (ref 15–41)
Albumin: 3.6 g/dL (ref 3.5–5.0)
Alkaline Phosphatase: 106 U/L (ref 38–126)
Anion gap: 7 (ref 5–15)
BUN: 33 mg/dL — ABNORMAL HIGH (ref 8–23)
CO2: 27 mmol/L (ref 22–32)
Calcium: 9.1 mg/dL (ref 8.9–10.3)
Chloride: 104 mmol/L (ref 98–111)
Creatinine: 1.16 mg/dL (ref 0.61–1.24)
GFR, Estimated: >60 mL/min (ref >60)
Glucose, Bld: 228 mg/dL — ABNORMAL HIGH (ref 70–99)
Potassium: 4.7 mmol/L (ref 3.5–5.1)
Sodium: 138 mmol/L (ref 135–145)
Total Bilirubin: 0.8 mg/dL (ref 0.3–1.2)
Total Protein: 6.5 g/dL (ref 6.5–8.1)

## 2021-01-22 LAB — SAMPLE TO BLOOD BANK

## 2021-01-22 LAB — CBC WITH DIFFERENTIAL (CANCER CENTER ONLY)
Abs Immature Granulocytes: 0.01 10*3/uL (ref 0.00–0.07)
Basophils Absolute: 0 10*3/uL (ref 0.0–0.1)
Basophils Relative: 0 %
Eosinophils Absolute: 0 10*3/uL (ref 0.0–0.5)
Eosinophils Relative: 0 %
HCT: 24.9 % — ABNORMAL LOW (ref 39.0–52.0)
Hemoglobin: 8.2 g/dL — ABNORMAL LOW (ref 13.0–17.0)
Immature Granulocytes: 0 %
Lymphocytes Relative: 8 %
Lymphs Abs: 0.3 10*3/uL — ABNORMAL LOW (ref 0.7–4.0)
MCH: 32.5 pg (ref 26.0–34.0)
MCHC: 32.9 g/dL (ref 30.0–36.0)
MCV: 98.8 fL (ref 80.0–100.0)
Monocytes Absolute: 0.1 10*3/uL (ref 0.1–1.0)
Monocytes Relative: 4 %
Neutro Abs: 2.9 10*3/uL (ref 1.7–7.7)
Neutrophils Relative %: 88 %
Platelet Count: 22 10*3/uL — ABNORMAL LOW (ref 150–400)
RBC: 2.52 MIL/uL — ABNORMAL LOW (ref 4.22–5.81)
RDW: 20 % — ABNORMAL HIGH (ref 11.5–15.5)
WBC Count: 3.3 10*3/uL — ABNORMAL LOW (ref 4.0–10.5)
nRBC: 0.9 % — ABNORMAL HIGH (ref 0.0–0.2)

## 2021-01-22 NOTE — ED Triage Notes (Signed)
Patient arrives from home with complaint of nose bleed that began around 2130. Pt seen earlier this week for same.

## 2021-01-23 ENCOUNTER — Observation Stay (HOSPITAL_COMMUNITY): Payer: Medicare Other

## 2021-01-23 ENCOUNTER — Encounter (HOSPITAL_COMMUNITY): Payer: Self-pay | Admitting: Osteopathic Medicine

## 2021-01-23 DIAGNOSIS — Z7901 Long term (current) use of anticoagulants: Secondary | ICD-10-CM | POA: Diagnosis not present

## 2021-01-23 DIAGNOSIS — Z86711 Personal history of pulmonary embolism: Secondary | ICD-10-CM | POA: Diagnosis not present

## 2021-01-23 DIAGNOSIS — D696 Thrombocytopenia, unspecified: Secondary | ICD-10-CM

## 2021-01-23 DIAGNOSIS — Z803 Family history of malignant neoplasm of breast: Secondary | ICD-10-CM | POA: Diagnosis not present

## 2021-01-23 DIAGNOSIS — D61818 Other pancytopenia: Secondary | ICD-10-CM | POA: Diagnosis not present

## 2021-01-23 DIAGNOSIS — R040 Epistaxis: Secondary | ICD-10-CM

## 2021-01-23 DIAGNOSIS — L308 Other specified dermatitis: Secondary | ICD-10-CM | POA: Diagnosis not present

## 2021-01-23 DIAGNOSIS — I480 Paroxysmal atrial fibrillation: Secondary | ICD-10-CM | POA: Diagnosis not present

## 2021-01-23 DIAGNOSIS — D701 Agranulocytosis secondary to cancer chemotherapy: Secondary | ICD-10-CM | POA: Diagnosis not present

## 2021-01-23 DIAGNOSIS — I5023 Acute on chronic systolic (congestive) heart failure: Secondary | ICD-10-CM | POA: Diagnosis not present

## 2021-01-23 DIAGNOSIS — D61810 Antineoplastic chemotherapy induced pancytopenia: Secondary | ICD-10-CM | POA: Diagnosis not present

## 2021-01-23 DIAGNOSIS — Z20822 Contact with and (suspected) exposure to covid-19: Secondary | ICD-10-CM | POA: Diagnosis not present

## 2021-01-23 DIAGNOSIS — Z8546 Personal history of malignant neoplasm of prostate: Secondary | ICD-10-CM | POA: Diagnosis not present

## 2021-01-23 DIAGNOSIS — Z86010 Personal history of colonic polyps: Secondary | ICD-10-CM | POA: Diagnosis not present

## 2021-01-23 DIAGNOSIS — J439 Emphysema, unspecified: Secondary | ICD-10-CM | POA: Diagnosis not present

## 2021-01-23 DIAGNOSIS — J3800 Paralysis of vocal cords and larynx, unspecified: Secondary | ICD-10-CM | POA: Diagnosis not present

## 2021-01-23 DIAGNOSIS — F17210 Nicotine dependence, cigarettes, uncomplicated: Secondary | ICD-10-CM | POA: Diagnosis not present

## 2021-01-23 DIAGNOSIS — T451X5A Adverse effect of antineoplastic and immunosuppressive drugs, initial encounter: Secondary | ICD-10-CM

## 2021-01-23 DIAGNOSIS — Z8701 Personal history of pneumonia (recurrent): Secondary | ICD-10-CM | POA: Diagnosis not present

## 2021-01-23 DIAGNOSIS — Z8349 Family history of other endocrine, nutritional and metabolic diseases: Secondary | ICD-10-CM | POA: Diagnosis not present

## 2021-01-23 DIAGNOSIS — J438 Other emphysema: Secondary | ICD-10-CM

## 2021-01-23 DIAGNOSIS — Z880 Allergy status to penicillin: Secondary | ICD-10-CM | POA: Diagnosis not present

## 2021-01-23 DIAGNOSIS — C3492 Malignant neoplasm of unspecified part of left bronchus or lung: Secondary | ICD-10-CM | POA: Diagnosis not present

## 2021-01-23 DIAGNOSIS — I110 Hypertensive heart disease with heart failure: Secondary | ICD-10-CM | POA: Diagnosis not present

## 2021-01-23 LAB — CBC WITH DIFFERENTIAL/PLATELET
Abs Immature Granulocytes: 0.01 10*3/uL (ref 0.00–0.07)
Basophils Absolute: 0 10*3/uL (ref 0.0–0.1)
Basophils Relative: 0 %
Eosinophils Absolute: 0 10*3/uL (ref 0.0–0.5)
Eosinophils Relative: 0 %
HCT: 23.3 % — ABNORMAL LOW (ref 39.0–52.0)
Hemoglobin: 7.8 g/dL — ABNORMAL LOW (ref 13.0–17.0)
Immature Granulocytes: 0 %
Lymphocytes Relative: 27 %
Lymphs Abs: 0.7 10*3/uL (ref 0.7–4.0)
MCH: 33.6 pg (ref 26.0–34.0)
MCHC: 33.5 g/dL (ref 30.0–36.0)
MCV: 100.4 fL — ABNORMAL HIGH (ref 80.0–100.0)
Monocytes Absolute: 0.3 10*3/uL (ref 0.1–1.0)
Monocytes Relative: 10 %
Neutro Abs: 1.5 10*3/uL — ABNORMAL LOW (ref 1.7–7.7)
Neutrophils Relative %: 63 %
Platelets: 16 10*3/uL — CL (ref 150–400)
RBC: 2.32 MIL/uL — ABNORMAL LOW (ref 4.22–5.81)
RDW: 21 % — ABNORMAL HIGH (ref 11.5–15.5)
WBC: 2.5 10*3/uL — ABNORMAL LOW (ref 4.0–10.5)
nRBC: 1.2 % — ABNORMAL HIGH (ref 0.0–0.2)

## 2021-01-23 LAB — BASIC METABOLIC PANEL
Anion gap: 6 (ref 5–15)
BUN: 31 mg/dL — ABNORMAL HIGH (ref 8–23)
CO2: 29 mmol/L (ref 22–32)
Calcium: 8.6 mg/dL — ABNORMAL LOW (ref 8.9–10.3)
Chloride: 102 mmol/L (ref 98–111)
Creatinine, Ser: 1.05 mg/dL (ref 0.61–1.24)
GFR, Estimated: >60 mL/min (ref >60)
Glucose, Bld: 123 mg/dL — ABNORMAL HIGH (ref 70–99)
Potassium: 3.4 mmol/L — ABNORMAL LOW (ref 3.5–5.1)
Sodium: 137 mmol/L (ref 135–145)

## 2021-01-23 LAB — RESP PANEL BY RT-PCR (FLU A&B, COVID) ARPGX2
Influenza A by PCR: NEGATIVE
Influenza B by PCR: NEGATIVE
SARS Coronavirus 2 by RT PCR: NEGATIVE

## 2021-01-23 MED ORDER — PREDNISONE 50 MG PO TABS
50.0000 mg | ORAL_TABLET | Freq: Every day | ORAL | Status: DC
  Administered 2021-01-23: 23:00:00 50 mg via ORAL
  Filled 2021-01-23 (×2): qty 1

## 2021-01-23 MED ORDER — SODIUM CHLORIDE 0.9 % IV SOLN
10.0000 mL/h | Freq: Once | INTRAVENOUS | Status: AC
  Administered 2021-01-23: 17:00:00 10 mL/h via INTRAVENOUS

## 2021-01-23 MED ORDER — PANTOPRAZOLE SODIUM 40 MG PO TBEC
40.0000 mg | DELAYED_RELEASE_TABLET | Freq: Every day | ORAL | Status: DC
  Administered 2021-01-24 – 2021-01-25 (×2): 40 mg via ORAL
  Filled 2021-01-23 (×2): qty 1

## 2021-01-23 MED ORDER — POTASSIUM CHLORIDE 10 MEQ/100ML IV SOLN
10.0000 meq | INTRAVENOUS | Status: AC
  Administered 2021-01-23 – 2021-01-24 (×2): 10 meq via INTRAVENOUS
  Filled 2021-01-23 (×2): qty 100

## 2021-01-23 MED ORDER — SODIUM CHLORIDE 0.9% FLUSH
3.0000 mL | INTRAVENOUS | Status: DC | PRN
  Administered 2021-01-24: 3 mL via INTRAVENOUS

## 2021-01-23 MED ORDER — LIDOCAINE-EPINEPHRINE (PF) 2 %-1:200000 IJ SOLN
10.0000 mL | Freq: Once | INTRAMUSCULAR | Status: AC
  Administered 2021-01-23: 10 mL
  Filled 2021-01-23: qty 20

## 2021-01-23 MED ORDER — TRIAMCINOLONE ACETONIDE 0.5 % EX CREA
TOPICAL_CREAM | Freq: Four times a day (QID) | CUTANEOUS | Status: DC | PRN
  Filled 2021-01-23: qty 15

## 2021-01-23 MED ORDER — SODIUM CHLORIDE 0.9% FLUSH
3.0000 mL | Freq: Two times a day (BID) | INTRAVENOUS | Status: DC
  Administered 2021-01-23: 23:00:00 3 mL via INTRAVENOUS

## 2021-01-23 MED ORDER — FOLIC ACID 1 MG PO TABS
1.0000 mg | ORAL_TABLET | Freq: Every day | ORAL | Status: DC
  Administered 2021-01-24 – 2021-01-25 (×2): 1 mg via ORAL
  Filled 2021-01-23 (×2): qty 1

## 2021-01-23 MED ORDER — TRANEXAMIC ACID FOR EPISTAXIS
500.0000 mg | Freq: Once | TOPICAL | Status: AC
  Administered 2021-01-23: 12:00:00 500 mg via TOPICAL
  Filled 2021-01-23: qty 10

## 2021-01-23 MED ORDER — DM-GUAIFENESIN ER 30-600 MG PO TB12: 1.0000 | ORAL_TABLET | Freq: Two times a day (BID) | ORAL | Status: DC | PRN

## 2021-01-23 MED ORDER — PROCHLORPERAZINE MALEATE 10 MG PO TABS
10.0000 mg | ORAL_TABLET | Freq: Four times a day (QID) | ORAL | Status: DC | PRN
  Filled 2021-01-23: qty 1

## 2021-01-23 MED ORDER — BOOST HIGH PROTEIN PO LIQD: 1.0000 | Freq: Two times a day (BID) | ORAL | Status: DC | PRN

## 2021-01-23 MED ORDER — SODIUM CHLORIDE 0.9 % IV SOLN: 250.0000 mL | INTRAVENOUS | Status: DC | PRN

## 2021-01-23 MED ORDER — SILVER NITRATE-POT NITRATE 75-25 % EX MISC
1.0000 | Freq: Once | CUTANEOUS | Status: AC
  Administered 2021-01-23: 11:00:00 1 via TOPICAL
  Filled 2021-01-23: qty 10

## 2021-01-23 MED ORDER — ALBUTEROL SULFATE (2.5 MG/3ML) 0.083% IN NEBU: 2.5000 mg | INHALATION_SOLUTION | RESPIRATORY_TRACT | Status: DC | PRN

## 2021-01-23 NOTE — Assessment & Plan Note (Signed)
ENT to assess/treat. Eliquis held for now.

## 2021-01-23 NOTE — H&P (Signed)
Joe Hill is an 80 y.o. male.   Chief Complaint:  Chief Complaint  Patient presents with   Epistaxis    HPI: nosebleed off and on for about 2 days. He is taking Eliquis for AFib. Seen in the ED 2 days ago but bleeding had stopped then and no further intervention was needed. His bleeding started back last night, continued oozing through the night while waiting to be seen. No injuries, no clots, no fever, denies bloody stool or other bleeding. Seen in ED w/ nasal packing in place.    Hospital course:  To ED 01/23/21 CC epistaxis, Dx Pancytopenia d/t chemo for lung CA, thrombocytopenia likely cause uncontrolled epistaxis --> ED initiated Plt transfusion and spoke to ENT who will see him at [facility]. Held Eliquis (AFib). Hx stable COPD/HTN/AFib. Only other complaint is LLE is sore, no hx DVT, looks c/w PVD dermatitis --> DVT US negative 4 days ago and pt reports compliance w/ anticoags at home and no worsening symptoms (though of course higher risk w/ CA), monitor, topical steroids prn for dermatitis.     ASSESSMENT/PLAN:   Pancytopenia secondary to chemotherapy, low Plt likely cause for refractory epistaxis Plt 16, trending down over the past week - Transfusion pending Hgb 23.3, also trending down but not by much compared to recent CBC's, monitor  WBC 2.5, also trending down   Epistaxis d/t thrombocytopenia d/t chemotherapy treating lung cancer  ENT to assess/treat.  Eliquis held for now.   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   Pulmonary embolism ([place]) 12/26/2020   TOBACCO USE 01/30/2007     Past Surgical History:  Procedure Laterality Date   APPENDECTOMY     CATARACT EXTRACTION     PROSTATE SURGERY     prostatectomy   VIDEO BRONCHOSCOPY WITH ENDOBRONCHIAL NAVIGATION N/A 10/30/2020   Procedure: VIDEO BRONCHOSCOPY WITH ENDOBRONCHIAL NAVIGATION;  Surgeon: Melrose Nakayama, MD;  Location: [facility] OR;  Service: Thoracic;  Laterality: N/A;   VIDEO BRONCHOSCOPY WITH ENDOBRONCHIAL ULTRASOUND N/A 10/30/2020   Procedure: VIDEO BRONCHOSCOPY WITH ENDOBRONCHIAL ULTRASOUND;  Surgeon: Melrose Nakayama, MD;  Location: [facility];  Service: Thoracic;  Laterality: N/A;    Family History  Problem Relation Age of Onset   Hyperlipidemia Mother    Cancer Father        lung, smoker   Cancer Sister        breast, smoker   Arthritis Maternal Aunt    Social History:  reports that he has been smoking cigarettes. He has been smoking an average of .25 packs per day. He has never used smokeless tobacco. He reports that he does not drink alcohol and does not use drugs.  Allergies:  Allergies  Allergen Reactions   Penicillins Hives    Did it involve swelling of the face/tongue/throat, SOB, or low BP? No Did it involve sudden or severe rash/hives, skin peeling, or any reaction on the inside of your mouth or nose? Yes Did you need to seek medical attention at a hospital or doctor's office? No When did it last happen?     childhood  If all above answers are "NO", may proceed with cephalosporin use.      (Not in a hospital admission)   Results for orders placed or performed during the hospital encounter of 01/22/21 (from the past 48 hour(s))  CBC with Differential     Status: Abnormal   Collection Time: 01/23/21 11:35 AM  Result Value Ref Range   WBC 2.5 (L) 4.0 - 10.5 K/uL   RBC 2.32 (L) 4.22 - 5.81 MIL/uL   Hemoglobin 7.8 (L) 13.0 - 17.0 g/dL   HCT 23.3 (L) 39.0 - 52.0 %   MCV 100.4 (H) 80.0 - 100.0 fL   MCH 33.6 26.0 - 34.0 pg   MCHC 33.5 30.0 - 36.0 g/dL   RDW 21.0 (H) 11.5 - 15.5  %   Platelets 16 (LL) 150 - 400 K/uL    Comment: Immature Platelet Fraction may be clinically indicated, consider ordering this additional test [id number] THIS CRITICAL RESULT HAS VERIFIED AND BEEN CALLED TO [place],[facility] BY PATRICIA LUZOLO ON 12 30 2022 AT 1148, AND HAS BEEN READ BACK. CRITICAL RESULT VERIFIED.    nRBC 1.2 (H) 0.0 - 0.2 %   Neutrophils Relative % 63 %   Neutro Abs 1.5 (L) 1.7 - 7.7 K/uL   Lymphocytes Relative 27 %   Lymphs Abs 0.7 0.7 - 4.0 K/uL   Monocytes Relative 10 %   Monocytes Absolute 0.3 0.1 - 1.0 K/uL   Eosinophils Relative 0 %   Eosinophils Absolute 0.0 0.0 - 0.5 K/uL   Basophils Relative 0 %   Basophils Absolute 0.0 0.0 - 0.1 K/uL   Immature Granulocytes 0 %   Abs Immature Granulocytes 0.01 0.00 - 0.07 K/uL    Comment: Performed at [facility], [place] [address], [place], [place] [address]  Basic metabolic panel     Status: Abnormal   Collection Time: 01/23/21 11:35 AM  Result Value Ref Range   Sodium 137 135 - 145 mmol/L   Potassium 3.4 (L) 3.5 - 5.1 mmol/L   Chloride 102 98 - 111 mmol/L   CO2 29 22 - 32 mmol/L   Glucose, Bld 123 (H) 70 - 99 mg/dL    Comment: Glucose reference range applies only to samples taken after fasting for at least 8 hours.   BUN 31 (H) 8 - 23 mg/dL   Creatinine, Ser 1.05 0.61 - 1.24 mg/dL   Calcium 8.6 (L) 8.9 - 10.3 mg/dL   GFR, Estimated >60 >60 mL/min    Comment: (NOTE) Calculated using the CKD-EPI Creatinine Equation (2021)    Anion gap 6 5 - 15    Comment: Performed at [facility], [place] [address], [place], [place] [address]  Prepare platelet pheresis     Status: None (Preliminary result)   Collection Time: 01/23/21 12:32 PM  Result Value Ref Range   Unit Number [id number]    Blood Component Type PLTP1 PSORALEN TREATED    Unit division 00    Status of Unit ISSUED    Transfusion Status      OK TO TRANSFUSE Performed at [facility] [address],  [place], [place] [address]    No results found.  Review of Systems  Constitutional:  Negative for chills and fever.  HENT:  Positive for nosebleeds. Negative for trouble swallowing.   Respiratory:  Negative for cough and choking.   Cardiovascular:  Negative for chest pain.  Gastrointestinal:  Negative for abdominal pain.  Musculoskeletal:  Reports pain in LLE  Skin:  Positive for color change.  Neurological:  Negative for syncope.   Blood pressure 121/82, pulse 98, temperature 97.9 F (36.6 C), temperature source Oral, resp. rate 19, height 5\' 10"  (1.778 m), weight 79.4 kg, SpO2 99 %. Physical Exam Constitutional:      General: He is not in acute distress.    Appearance: Normal appearance. He is normal weight.  HENT:     Head: Normocephalic.     Nose:     Comments: Packing in place L nostril  Eyes:     Extraocular Movements: Extraocular movements intact.  Cardiovascular:     Rate and Rhythm: Normal rate. Rhythm irregular.  Pulmonary:     Effort: Pulmonary effort is normal.     Breath sounds: Normal breath sounds.  Abdominal:     Palpations: Abdomen is soft.  Musculoskeletal:     Cervical back: Normal range of motion.     Right lower leg: Edema (1+ to ankles) present.     Left lower leg: Edema (1+ to ankles, mild erythema w/o warmth on anterior shin c/w PVD dermatitis) present.  Skin:    Comments: See above  Neurological:     General: No focal deficit present.     Mental Status: He is alert and oriented to person, place, and time.  Psychiatric:        Thought Content: Thought content normal.     Emeterio Reeve, DO 01/23/2021, 6:07 PM

## 2021-01-23 NOTE — ED Notes (Signed)
Patient has platelets ready, notified Carley,RN.

## 2021-01-23 NOTE — ED Provider Notes (Signed)
[facility] EMERGENCY DEPARTMENT Provider Note  CSN: [id number] Arrival date & time: 01/22/21 2228    History Chief Complaint  Patient presents with   Epistaxis    Joe Hill is a 80 y.o. male with nosebleed off and on for about 2 days. He is taking Eliquis. Seen in the ED 2 days ago but bleeding had stopped then and no further intervention was needed. His bleeding started back last night, continued oozing through the night while waiting to be seen. No injuries, no clots, no fever.    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   Pulmonary embolism ([place]) 12/26/2020   TOBACCO USE 01/30/2007    Past Surgical History:  Procedure Laterality Date   APPENDECTOMY     CATARACT EXTRACTION     PROSTATE SURGERY     prostatectomy   VIDEO BRONCHOSCOPY WITH ENDOBRONCHIAL NAVIGATION N/A 10/30/2020   Procedure: VIDEO BRONCHOSCOPY WITH ENDOBRONCHIAL NAVIGATION;  Surgeon: Melrose Nakayama, MD;  Location: [facility];  Service: Thoracic;  Laterality: N/A;   VIDEO BRONCHOSCOPY WITH ENDOBRONCHIAL ULTRASOUND N/A 10/30/2020   Procedure: VIDEO BRONCHOSCOPY WITH ENDOBRONCHIAL ULTRASOUND;  Surgeon: Melrose Nakayama, MD;  Location: [facility] OR;  Service: Thoracic;  Laterality: N/A;    Family History  Problem Relation Age of Onset   Hyperlipidemia Mother    Cancer Father        lung, smoker   Cancer Sister        breast, smoker   Arthritis Maternal Aunt     Social History   Tobacco Use   Smoking status: Every Day    Packs/day: 0.25    Types: Cigarettes   Smokeless tobacco: Never  Vaping Use   Vaping Use: Never used  Substance Use Topics   Alcohol use: No    Alcohol/week: 0.0 standard drinks   Drug use: No     Home  Medications Prior to Admission medications   Medication Sig Start Date End Date Taking? Authorizing Provider  albuterol (VENTOLIN HFA) 108 (90 Base) MCG/ACT inhaler TAKE 2 PUFFS BY MOUTH EVERY 6 HOURS AS NEEDED FOR WHEEZE OR SHORTNESS OF BREATH Patient taking differently: 2 puffs every 6 (six) hours as needed for wheezing or shortness of breath. 01/07/21   [name], MD  apixaban (ELIQUIS) 5 MG TABS tablet Take 5 mg by mouth 2 (two) times daily.    [provider]  dextromethorphan-guaiFENesin (MUCINEX DM) 30-600 MG 12hr tablet Take 1 tablet by mouth 2 (two) times daily as needed for cough.    [provider]  feeding supplement (BOOST HIGH PROTEIN) LIQD Take 1 Container by mouth 2 (two) times daily as needed (nutrition).    [provider]  folic acid (FOLVITE) 1 MG tablet Take 1 tablet (1 mg total) by mouth daily. 11/18/20   Curt Bears, MD  furosemide (LASIX) 40 MG tablet Take 1 tablet (40 mg total) by mouth daily for 7 days. Patient not taking: Reported on 01/18/2021 01/02/21 01/09/21  [place] Eric J, DO  pantoprazole (PROTONIX) 40 MG tablet Take 1 tablet (40 mg total) by mouth daily. Patient taking differently: Take 40 mg by mouth daily as needed (for acid reflux). 01/02/21 04/02/21  [place] Donnamarie Poag, DO  potassium chloride SA (  KLOR-CON) 20 MEQ tablet Take 1 tablet (20 mEq total) by mouth daily. Take 2 tablets (57meq) for 2 days then take 1 tablet (25meq) daily Patient not taking: Reported on 01/18/2021 11/17/20   Shirley Friar, PA-C  predniSONE (DELTASONE) 50 MG tablet Take 1 tablet (50 mg total) by mouth daily. 27/06/23   Delora Fuel, MD  prochlorperazine (COMPAZINE) 10 MG tablet Take 1 tablet (10 mg total) by mouth every 6 (six) hours as needed for nausea or vomiting. 11/18/20   Curt Bears, MD     Allergies    Penicillins   Review of Systems   Review of Systems A comprehensive review of systems was completed and negative except as  noted in HPI.    Physical Exam BP 132/70    Pulse 95    Temp 97.7 F (36.5 C) (Oral)    Resp 16    Ht 5\' 10"  (1.778 m)    Wt 79.4 kg    SpO2 99%    BMI 25.11 kg/m   Physical Exam Vitals and nursing note reviewed.  HENT:     Head: Normocephalic.     Nose:     Comments: Small amount of crusted dried blood in R nare. There is some oozing of blood in L nare, appears to be originating from anterior nasal septum.  Eyes:     Extraocular Movements: Extraocular movements intact.  Pulmonary:     Effort: Pulmonary effort is normal.  Musculoskeletal:        General: Normal range of motion.     Cervical back: Neck supple.  Skin:    Findings: No rash (on exposed skin).  Neurological:     Mental Status: He is alert and oriented to person, place, and time.  Psychiatric:        Mood and Affect: Mood normal.     ED Results / Procedures / Treatments   Labs (all labs ordered are listed, but only abnormal results are displayed) Labs Reviewed  CBC WITH DIFFERENTIAL/PLATELET - Abnormal; Notable for the following components:      Result Value   WBC 2.5 (*)    RBC 2.32 (*)    Hemoglobin 7.8 (*)    HCT 23.3 (*)    MCV 100.4 (*)    RDW 21.0 (*)    Platelets 16 (*)    nRBC 1.2 (*)    Neutro Abs 1.5 (*)    All other components within normal limits  BASIC METABOLIC PANEL - Abnormal; Notable for the following components:   Potassium 3.4 (*)    Glucose, Bld 123 (*)    BUN 31 (*)    Calcium 8.6 (*)    All other components within normal limits  RESP PANEL BY RT-PCR (FLU A&B, COVID) ARPGX2  PREPARE PLATELET PHERESIS    EKG None  Radiology No results found.  Procedures .Epistaxis Management  Date/Time: 01/23/2021 10:03 AM Performed by: Truddie Hidden, MD Authorized by: Truddie Hidden, MD   Consent:    Consent obtained:  Verbal   Consent given by:  Patient Anesthesia:    Anesthesia method:  Topical application Procedure details:    Treatment site:  L anterior    Treatment method:  Silver nitrate   Treatment episode: recurring   Post-procedure details:    Assessment:  No improvement .Epistaxis Management  Date/Time: 01/23/2021 10:04 AM Performed by: Truddie Hidden, MD Authorized by: Truddie Hidden, MD   Procedure details:    Treatment site:  L  anterior   Treatment method:  Anterior pack   Treatment complexity:  Limited   Treatment episode: recurring   Post-procedure details:    Assessment:  No improvement   Procedure completion:  Tolerated well, no immediate complications .Epistaxis Management  Date/Time: 01/23/2021 2:55 PM Performed by: Truddie Hidden, MD Authorized by: Truddie Hidden, MD   Procedure details:    Treatment site:  L anterior   Treatment method:  Nasal tampon (TXA)   Treatment episode: recurring   Post-procedure details:    Assessment:  Bleeding decreased   Procedure completion:  Tolerated well, no immediate complications  Medications Ordered in the ED Medications  tranexamic acid (CYKLOKAPRON) 1000 MG/10ML topical solution 500 mg (has no administration in time range)  0.9 %  sodium chloride infusion (has no administration in time range)  lidocaine-EPINEPHrine (XYLOCAINE W/EPI) 2 %-1:200000 (PF) injection 10 mL (10 mLs Other Given by Other 01/23/21 1106)  silver nitrate applicators applicator 1 Stick (1 Stick Topical Given by Other 01/23/21 1106)     MDM Rules/Calculators/A&P MDM Patient with recurrent epistaxis on eliquis. Not stopped with silver nitrate or with anterior balloon packing. Will discuss with ENT.   ED Course  I have reviewed the triage vital signs and the nursing notes.  Pertinent labs & imaging results that were available during my care of the patient were reviewed by me and considered in my medical decision making (see chart for details).  Clinical Course as of 01/23/21 1456  Fri Jan 23, 2021  1117 Spoke with Dr. [name], ENT who states he is in the OR today and unable to  help. He suggests repacking. On further review of chart, patient also recently noted to be thrombocytopenic. Will repack with TXA, recheck labs. May require admission if bleeding does not stop.  [CS]  7341 PLT continues to trend down, now less than 20. Will admit for PLT transfusion.  [CS]  1334 Bleeding improved after TXA and Rapid Rhino. Will discuss with hospitalist for admission. Dr. [name] is aware and will be available to consult if he rebleeds while inpatient.  [CS]  46 Spoke with Dr. Sheppard Coil, Hospitalist, who will evaluate for admission.  [CS]    Clinical Course User Index [CS] Truddie Hidden, MD    Final Clinical Impression(s) / ED Diagnoses Final diagnoses:  Epistaxis  Pancytopenia [facility])    Rx / DC Orders ED Discharge Orders     None        Truddie Hidden, MD 01/23/21 [phone]

## 2021-01-23 NOTE — Assessment & Plan Note (Addendum)
Transfusion pending

## 2021-01-24 ENCOUNTER — Other Ambulatory Visit: Payer: Self-pay

## 2021-01-24 DIAGNOSIS — I5023 Acute on chronic systolic (congestive) heart failure: Secondary | ICD-10-CM | POA: Diagnosis present

## 2021-01-24 DIAGNOSIS — Z7901 Long term (current) use of anticoagulants: Secondary | ICD-10-CM | POA: Diagnosis not present

## 2021-01-24 DIAGNOSIS — T451X5A Adverse effect of antineoplastic and immunosuppressive drugs, initial encounter: Secondary | ICD-10-CM | POA: Diagnosis present

## 2021-01-24 DIAGNOSIS — Z880 Allergy status to penicillin: Secondary | ICD-10-CM | POA: Diagnosis not present

## 2021-01-24 DIAGNOSIS — D61810 Antineoplastic chemotherapy induced pancytopenia: Secondary | ICD-10-CM | POA: Diagnosis present

## 2021-01-24 DIAGNOSIS — Z20822 Contact with and (suspected) exposure to covid-19: Secondary | ICD-10-CM | POA: Diagnosis present

## 2021-01-24 DIAGNOSIS — R040 Epistaxis: Secondary | ICD-10-CM | POA: Diagnosis present

## 2021-01-24 DIAGNOSIS — Z8546 Personal history of malignant neoplasm of prostate: Secondary | ICD-10-CM | POA: Diagnosis not present

## 2021-01-24 DIAGNOSIS — D696 Thrombocytopenia, unspecified: Secondary | ICD-10-CM | POA: Diagnosis not present

## 2021-01-24 DIAGNOSIS — Z803 Family history of malignant neoplasm of breast: Secondary | ICD-10-CM | POA: Diagnosis not present

## 2021-01-24 DIAGNOSIS — J3800 Paralysis of vocal cords and larynx, unspecified: Secondary | ICD-10-CM | POA: Diagnosis present

## 2021-01-24 DIAGNOSIS — Z8701 Personal history of pneumonia (recurrent): Secondary | ICD-10-CM | POA: Diagnosis not present

## 2021-01-24 DIAGNOSIS — J439 Emphysema, unspecified: Secondary | ICD-10-CM | POA: Diagnosis present

## 2021-01-24 DIAGNOSIS — Z8349 Family history of other endocrine, nutritional and metabolic diseases: Secondary | ICD-10-CM | POA: Diagnosis not present

## 2021-01-24 DIAGNOSIS — L308 Other specified dermatitis: Secondary | ICD-10-CM | POA: Diagnosis present

## 2021-01-24 DIAGNOSIS — C3492 Malignant neoplasm of unspecified part of left bronchus or lung: Secondary | ICD-10-CM | POA: Diagnosis present

## 2021-01-24 DIAGNOSIS — I110 Hypertensive heart disease with heart failure: Secondary | ICD-10-CM | POA: Diagnosis present

## 2021-01-24 DIAGNOSIS — Z86010 Personal history of colonic polyps: Secondary | ICD-10-CM | POA: Diagnosis not present

## 2021-01-24 DIAGNOSIS — I480 Paroxysmal atrial fibrillation: Secondary | ICD-10-CM | POA: Diagnosis present

## 2021-01-24 DIAGNOSIS — F17210 Nicotine dependence, cigarettes, uncomplicated: Secondary | ICD-10-CM | POA: Diagnosis present

## 2021-01-24 DIAGNOSIS — Z86711 Personal history of pulmonary embolism: Secondary | ICD-10-CM | POA: Diagnosis not present

## 2021-01-24 LAB — CBC
HCT: 23.3 % — ABNORMAL LOW (ref 39.0–52.0)
HCT: 23 % — ABNORMAL LOW (ref 39.0–52.0)
Hemoglobin: 7.9 g/dL — ABNORMAL LOW (ref 13.0–17.0)
Hemoglobin: 8 g/dL — ABNORMAL LOW (ref 13.0–17.0)
MCH: 33.8 pg (ref 26.0–34.0)
MCH: 33.9 pg (ref 26.0–34.0)
MCHC: 33.9 g/dL (ref 30.0–36.0)
MCHC: 34.8 g/dL (ref 30.0–36.0)
MCV: 99.6 fL (ref 80.0–100.0)
MCV: 97.5 fL (ref 80.0–100.0)
Platelets: 31 10*3/uL — ABNORMAL LOW (ref 150–400)
Platelets: 36 10*3/uL — ABNORMAL LOW (ref 150–400)
RBC: 2.34 MIL/uL — ABNORMAL LOW (ref 4.22–5.81)
RBC: 2.36 MIL/uL — ABNORMAL LOW (ref 4.22–5.81)
RDW: 21 % — ABNORMAL HIGH (ref 11.5–15.5)
RDW: 21.2 % — ABNORMAL HIGH (ref 11.5–15.5)
WBC: 2.3 10*3/uL — ABNORMAL LOW (ref 4.0–10.5)
WBC: 2.6 10*3/uL — ABNORMAL LOW (ref 4.0–10.5)
nRBC: 0.9 % — ABNORMAL HIGH (ref 0.0–0.2)
nRBC: 1.6 % — ABNORMAL HIGH (ref 0.0–0.2)

## 2021-01-24 LAB — COMPREHENSIVE METABOLIC PANEL
ALT: 43 U/L (ref 0–44)
AST: 29 U/L (ref 15–41)
Albumin: 3.3 g/dL — ABNORMAL LOW (ref 3.5–5.0)
Alkaline Phosphatase: 85 U/L (ref 38–126)
Anion gap: 9 (ref 5–15)
BUN: 24 mg/dL — ABNORMAL HIGH (ref 8–23)
CO2: 28 mmol/L (ref 22–32)
Calcium: 8.7 mg/dL — ABNORMAL LOW (ref 8.9–10.3)
Chloride: 102 mmol/L (ref 98–111)
Creatinine, Ser: 1.22 mg/dL (ref 0.61–1.24)
GFR, Estimated: 60 mL/min — ABNORMAL LOW (ref >60)
Glucose, Bld: 103 mg/dL — ABNORMAL HIGH (ref 70–99)
Potassium: 3.7 mmol/L (ref 3.5–5.1)
Sodium: 139 mmol/L (ref 135–145)
Total Bilirubin: 1.3 mg/dL — ABNORMAL HIGH (ref 0.3–1.2)
Total Protein: 5.8 g/dL — ABNORMAL LOW (ref 6.5–8.1)

## 2021-01-24 MED ORDER — MELATONIN 3 MG PO TABS
3.0000 mg | ORAL_TABLET | Freq: Every day | ORAL | Status: DC
  Administered 2021-01-24: 3 mg via ORAL
  Filled 2021-01-24: qty 1

## 2021-01-24 MED ORDER — DILTIAZEM HCL 25 MG/5ML IV SOLN
5.0000 mg | Freq: Once | INTRAVENOUS | Status: DC | PRN
  Filled 2021-01-24 (×2): qty 5

## 2021-01-24 MED ORDER — DILTIAZEM HCL ER COATED BEADS 240 MG PO CP24
240.0000 mg | ORAL_CAPSULE | Freq: Every day | ORAL | Status: DC
  Administered 2021-01-24 – 2021-01-25 (×2): 240 mg via ORAL
  Filled 2021-01-24 (×2): qty 1

## 2021-01-24 MED ORDER — POTASSIUM CHLORIDE CRYS ER 20 MEQ PO TBCR
40.0000 meq | EXTENDED_RELEASE_TABLET | Freq: Two times a day (BID) | ORAL | Status: AC
  Administered 2021-01-24 (×2): 40 meq via ORAL
  Filled 2021-01-24 (×2): qty 2

## 2021-01-24 MED ORDER — ALPRAZOLAM 0.5 MG PO TABS
0.5000 mg | ORAL_TABLET | Freq: Two times a day (BID) | ORAL | Status: DC | PRN
  Administered 2021-01-24 (×2): 0.5 mg via ORAL
  Filled 2021-01-24 (×2): qty 1

## 2021-01-24 MED ORDER — FUROSEMIDE 10 MG/ML IJ SOLN
20.0000 mg | Freq: Two times a day (BID) | INTRAMUSCULAR | Status: AC
  Administered 2021-01-24 (×2): 20 mg via INTRAVENOUS
  Filled 2021-01-24 (×2): qty 2

## 2021-01-24 MED ORDER — LEVALBUTEROL HCL 0.63 MG/3ML IN NEBU
0.6300 mg | INHALATION_SOLUTION | Freq: Four times a day (QID) | RESPIRATORY_TRACT | Status: DC | PRN
  Filled 2021-01-24: qty 3

## 2021-01-24 NOTE — Progress Notes (Signed)
Mobility Specialist Progress Note:   01/24/21 1200  Mobility  Activity Ambulated in hall;Ambulated in room  Level of Assistance Independent  Assistive Device None  Distance Ambulated (ft) 40 ft  Mobility Ambulated independently in hallway;Ambulated independently in room  Mobility Response Tolerated fair  Mobility performed by Mobility specialist  Bed Position Chair  $Mobility charge 1 Mobility   Pt in hallway upon arrival. States that he was very anxious and couldn't sleep. Redirected pt back to room, situated in chair. RN in to see.   Nelta Numbers Mobility Specialist  Phone [phone]

## 2021-01-24 NOTE — Progress Notes (Signed)
Pt refuses to keep Tele box on.  Has pulled off many times this shift.

## 2021-01-24 NOTE — Progress Notes (Signed)
MD on call contacted for patients HR in the 140-150's but nonsustained. EKG done. See epic for orders.

## 2021-01-24 NOTE — Care Management Obs Status (Signed)
[facility] NOTIFICATION   Patient Details  Name: MASAI KIDD MRN: [id number] Date of Birth: [birth year]   Medicare Observation Status Notification Given:  Yes    Bartholomew Crews, RN 01/24/2021, 1:08 PM

## 2021-01-24 NOTE — Plan of Care (Signed)
°  Problem: Education: Goal: Knowledge of disease or condition will improve Outcome: Progressing Goal: Understanding of medication regimen will improve Outcome: Progressing   Problem: Activity: Goal: Ability to tolerate increased activity will improve Outcome: Progressing   Problem: Health Behavior/Discharge Planning: Goal: Ability to safely manage health-related needs after discharge will improve Outcome: Progressing   Problem: Clinical Measurements: Goal: Respiratory complications will improve Outcome: Progressing

## 2021-01-24 NOTE — Progress Notes (Signed)
Pt very anxious, administered Xanax prn dose that was ordered.

## 2021-01-24 NOTE — Progress Notes (Signed)
PROGRESS NOTE    Joe Hill  NTZ:[id number] DOB: 1940-06-09 DOA: 01/22/2021 PCP: [name], MD  Brief Narrative: Joe Hill is an 80/M with history of stage IV non-small cell lung cancer on chemotherapy, followed by Dr. [name], COPD, chronic systolic CHF, history of partial vocal cord paralysis, paroxysmal atrial fibrillation on Eliquis, tobacco abuse was recently admitted with sepsis, healthcare associated pneumonia, discharged on 12/19.  He then presented to [name], ED on 12/28 with nosebleed, this stopped spontaneously and was subsequently discharged home. Came back to the ED and was admitted overnight for recurrence of epistaxis.  He was noted to have a platelet count of 16-22K, hemoglobin of 7.8, treated with TXA, thrombin pads, eventually required placement of an anterior nasal pack. -Admitted overnight for observation  Assessment & Plan:   Recurrent epistaxis Severe thrombocytopenia -Suspect he has epistaxis secondary to severe thrombocytopenia, ongoing anticoagulation with Eliquis -Last chemo 12/15 -Platelet count was 16 K yesterday, transfused 1 unit of platelets overnight, platelet count 31K at 1 AM, will repeat CBC this afternoon -Eliquis held, in the setting, will need to be discontinued at discharge until platelet counts recover significantly -Epistaxis appears to have resolved following anterior nasal packing overnight, recommend ENT follow-up with Dr. [name] in 1 week to remove this -Discharge home later today or in a.m.  Acute on chronic systolic CHF -Last echo with EF of 45% -Has 2+ lower extremity edema, Dopplers were negative for DVT on 12/26 -Will administer IV Lasix x2 doses today  Paroxysmal atrial fibrillation -Resume Cardizem, Eliquis held in the setting of severe thrombocytopenia and epistaxis  Anemia, pancytopenia -Secondary to cancer/chemotherapy, hemoglobin relatively stable  Stage IV non-small cell lung cancer -Followed by Dr.  [name], last chemo 12/15  COPD -Stable  DVT prophylaxis: SCDs Code Status: Full code Family Communication: No family at bedside Disposition Plan:  Status is: Observation   Procedures:   Antimicrobials:    Subjective: -Upset about being in the hospital, just got platelet transfusion, no further bleeding/epistaxis since nasal packing in the ED  Objective: Vitals:   01/23/21 2012 01/24/21 0027 01/24/21 0500 01/24/21 0900  BP: 117/75 110/81 116/87 131/66  Pulse: 99 99 (!) 57 (!) 105  Resp: 20 20 20 18   Temp: 97.7 F (36.5 C) (!) 97.4 F (36.3 C) (!) 97.4 F (36.3 C) 98.1 F (36.7 C)  TempSrc: Oral Oral Oral Oral  SpO2: 100% 100% 100% 98%  Weight:      Height:        Intake/Output Summary (Last 24 hours) at 01/24/2021 1034 Last data filed at 01/24/2021 0300 Gross per 24 hour  Intake 286.49 ml  Output 500 ml  Net -213.51 ml   Filed Weights   01/22/21 2242  Weight: 79.4 kg    Examination:  General exam: Chronically ill elderly male sitting up in bed, AAOx3, no distress HEENT: Anterior nasal packing noted, string, no active bleeding or oozing noted CVS: S1-S2, regular rate rhythm Lungs: Poor air movement bilaterally Abdomen: Soft, nontender, bowel sounds present Extremities: 1-2+ edema, left foot with large scab, pinkish discoloration Skin: As above Psychiatry: Anxious appearing   Data Reviewed:   CBC: Recent Labs  Lab 01/18/21 0520 01/19/21 0449 01/22/21 1548 01/23/21 1135 01/24/21 0114  WBC 2.9* 4.1 3.3* 2.5* 2.3*  NEUTROABS 2.0 3.3 2.9 1.5*  --   HGB 8.2* 8.1* 8.2* 7.8* 7.9*  HCT 24.8* 24.3* 24.9* 23.3* 23.3*  MCV 98.8 98.4 98.8 100.4* 99.6  PLT 106* 87* 22* 16* 31*  Basic Metabolic Panel: Recent Labs  Lab 01/18/21 0520 01/19/21 0449 01/22/21 1602 01/23/21 1135 01/24/21 0114  NA 139 137 138 137 139  K 3.5 4.1 4.7 3.4* 3.7  CL 103 101 104 102 102  CO2 30 27 27 29 28   GLUCOSE 95 153* 228* 123* 103*  BUN 39* 42* 33* 31* 24*   CREATININE 1.10 1.08 1.16 1.05 1.22  CALCIUM 9.0 8.9 9.1 8.6* 8.7*   GFR: Estimated Creatinine Clearance: 49.9 mL/min (by C-G formula based on SCr of 1.22 mg/dL). Liver Function Tests: Recent Labs  Lab 01/18/21 0520 01/19/21 0449 01/22/21 1602 01/24/21 0114  AST 23 24 29 29   ALT 23 25 47* 43  ALKPHOS 100 107 106 85  BILITOT 1.0 1.2 0.8 1.3*  PROT 6.6 6.8 6.5 5.8*  ALBUMIN 3.5 3.7 3.6 3.3*   No results for input(s): LIPASE, AMYLASE in the last 168 hours. No results for input(s): AMMONIA in the last 168 hours. Coagulation Profile: No results for input(s): INR, PROTIME in the last 168 hours. Cardiac Enzymes: No results for input(s): CKTOTAL, CKMB, CKMBINDEX, TROPONINI in the last 168 hours. BNP (last 3 results) No results for input(s): PROBNP in the last 8760 hours. HbA1C: No results for input(s): HGBA1C in the last 72 hours. CBG: No results for input(s): GLUCAP in the last 168 hours. Lipid Profile: No results for input(s): CHOL, HDL, LDLCALC, TRIG, CHOLHDL, LDLDIRECT in the last 72 hours. Thyroid Function Tests: No results for input(s): TSH, T4TOTAL, FREET4, T3FREE, THYROIDAB in the last 72 hours. Anemia Panel: No results for input(s): VITAMINB12, FOLATE, FERRITIN, TIBC, IRON, RETICCTPCT in the last 72 hours. Urine analysis:    Component Value Date/Time   COLORURINE YELLOW 01/11/2021 0926   APPEARANCEUR CLEAR 01/11/2021 0926   LABSPEC 1.010 01/11/2021 0926   PHURINE 6.5 01/11/2021 0926   GLUCOSEU NEGATIVE 01/11/2021 0926   GLUCOSEU NEGATIVE 11/17/2017 0829   HGBUR NEGATIVE 01/11/2021 0926   BILIRUBINUR NEGATIVE 01/11/2021 0926   BILIRUBINUR Negative 10/10/2018 0946   KETONESUR NEGATIVE 01/11/2021 0926   PROTEINUR NEGATIVE 01/11/2021 0926   UROBILINOGEN 0.2 10/10/2018 0946   UROBILINOGEN 0.2 11/17/2017 0829   NITRITE NEGATIVE 01/11/2021 0926   LEUKOCYTESUR NEGATIVE 01/11/2021 0926   Sepsis Labs: @LABRCNTIP (procalcitonin:4,lacticidven:4)  ) Recent Results  (from the past 240 hour(s))  Resp Panel by RT-PCR (Flu A&B, Covid) Nasopharyngeal Swab     Status: None   Collection Time: 01/16/21 12:49 AM   Specimen: Nasopharyngeal Swab; Nasopharyngeal(NP) swabs in vial transport medium  Result Value Ref Range Status   SARS Coronavirus 2 by RT PCR NEGATIVE NEGATIVE Final    Comment: (NOTE) SARS-CoV-2 target nucleic acids are NOT DETECTED.  The SARS-CoV-2 RNA is generally detectable in upper respiratory specimens during the acute phase of infection. The lowest concentration of SARS-CoV-2 viral copies this assay can detect is 138 copies/mL. A negative result does not preclude SARS-Cov-2 infection and should not be used as the sole basis for treatment or other patient management decisions. A negative result may occur with  improper specimen collection/handling, submission of specimen other than nasopharyngeal swab, presence of viral mutation(s) within the areas targeted by this assay, and inadequate number of viral copies(<138 copies/mL). A negative result must be combined with clinical observations, patient history, and epidemiological information. The expected result is Negative.  Fact Sheet for Patients:  EntrepreneurPulse.com.au  Fact Sheet for Healthcare Providers:  IncredibleEmployment.be  This test is no t yet approved or cleared by the [place] and  has been authorized  for detection and/or diagnosis of SARS-CoV-2 by FDA under an Emergency Use Authorization (EUA). This EUA will remain  in effect (meaning this test can be used) for the duration of the COVID-19 declaration under Section 564(b)(1) of the Act, 21 U.S.C.section 360bbb-3(b)(1), unless the authorization is terminated  or revoked sooner.       Influenza A by PCR NEGATIVE NEGATIVE Final   Influenza B by PCR NEGATIVE NEGATIVE Final    Comment: (NOTE) The Xpert Xpress SARS-CoV-2/FLU/RSV plus assay is intended as an aid in the  diagnosis of influenza from Nasopharyngeal swab specimens and should not be used as a sole basis for treatment. Nasal washings and aspirates are unacceptable for Xpert Xpress SARS-CoV-2/FLU/RSV testing.  Fact Sheet for Patients: EntrepreneurPulse.com.au  Fact Sheet for Healthcare Providers: IncredibleEmployment.be  This test is not yet approved or cleared by the [place] FDA and has been authorized for detection and/or diagnosis of SARS-CoV-2 by FDA under an Emergency Use Authorization (EUA). This EUA will remain in effect (meaning this test can be used) for the duration of the COVID-19 declaration under Section 564(b)(1) of the Act, 21 U.S.C. section 360bbb-3(b)(1), unless the authorization is terminated or revoked.  Performed at [facility], [place] [address] [place], [place] [address]   Resp Panel by RT-PCR (Flu A&B, Covid) Nasopharyngeal Swab     Status: None   Collection Time: 01/18/21  5:20 AM   Specimen: Nasopharyngeal Swab; Nasopharyngeal(NP) swabs in vial transport medium  Result Value Ref Range Status   SARS Coronavirus 2 by RT PCR NEGATIVE NEGATIVE Final    Comment: (NOTE) SARS-CoV-2 target nucleic acids are NOT DETECTED.  The SARS-CoV-2 RNA is generally detectable in upper respiratory specimens during the acute phase of infection. The lowest concentration of SARS-CoV-2 viral copies this assay can detect is 138 copies/mL. A negative result does not preclude SARS-Cov-2 infection and should not be used as the sole basis for treatment or other patient management decisions. A negative result may occur with  improper specimen collection/handling, submission of specimen other than nasopharyngeal swab, presence of viral mutation(s) within the areas targeted by this assay, and inadequate number of viral copies(<138 copies/mL). A negative result must be combined with clinical observations, patient history, and  epidemiological information. The expected result is Negative.  Fact Sheet for Patients:  EntrepreneurPulse.com.au  Fact Sheet for Healthcare Providers:  IncredibleEmployment.be  This test is no t yet approved or cleared by the [place] FDA and  has been authorized for detection and/or diagnosis of SARS-CoV-2 by FDA under an Emergency Use Authorization (EUA). This EUA will remain  in effect (meaning this test can be used) for the duration of the COVID-19 declaration under Section 564(b)(1) of the Act, 21 U.S.C.section 360bbb-3(b)(1), unless the authorization is terminated  or revoked sooner.       Influenza A by PCR NEGATIVE NEGATIVE Final   Influenza B by PCR NEGATIVE NEGATIVE Final    Comment: (NOTE) The Xpert Xpress SARS-CoV-2/FLU/RSV plus assay is intended as an aid in the diagnosis of influenza from Nasopharyngeal swab specimens and should not be used as a sole basis for treatment. Nasal washings and aspirates are unacceptable for Xpert Xpress SARS-CoV-2/FLU/RSV testing.  Fact Sheet for Patients: EntrepreneurPulse.com.au  Fact Sheet for Healthcare Providers: IncredibleEmployment.be  This test is not yet approved or cleared by the [place] FDA and has been authorized for detection and/or diagnosis of SARS-CoV-2 by FDA under an Emergency Use Authorization (EUA). This EUA will remain in effect (meaning  this test can be used) for the duration of the COVID-19 declaration under Section 564(b)(1) of the Act, 21 U.S.C. section 360bbb-3(b)(1), unless the authorization is terminated or revoked.  Performed at [facility], [place] [address], [place], [place] [address]   Resp Panel by RT-PCR (Flu A&B, Covid) Nasopharyngeal Swab     Status: None   Collection Time: 01/23/21  5:26 PM   Specimen: Nasopharyngeal Swab; Nasopharyngeal(NP) swabs in vial transport medium  Result Value Ref  Range Status   SARS Coronavirus 2 by RT PCR NEGATIVE NEGATIVE Final    Comment: (NOTE) SARS-CoV-2 target nucleic acids are NOT DETECTED.  The SARS-CoV-2 RNA is generally detectable in upper respiratory specimens during the acute phase of infection. The lowest concentration of SARS-CoV-2 viral copies this assay can detect is 138 copies/mL. A negative result does not preclude SARS-Cov-2 infection and should not be used as the sole basis for treatment or other patient management decisions. A negative result may occur with  improper specimen collection/handling, submission of specimen other than nasopharyngeal swab, presence of viral mutation(s) within the areas targeted by this assay, and inadequate number of viral copies(<138 copies/mL). A negative result must be combined with clinical observations, patient history, and epidemiological information. The expected result is Negative.  Fact Sheet for Patients:  EntrepreneurPulse.com.au  Fact Sheet for Healthcare Providers:  IncredibleEmployment.be  This test is no t yet approved or cleared by the [place] FDA and  has been authorized for detection and/or diagnosis of SARS-CoV-2 by FDA under an Emergency Use Authorization (EUA). This EUA will remain  in effect (meaning this test can be used) for the duration of the COVID-19 declaration under Section 564(b)(1) of the Act, 21 U.S.C.section 360bbb-3(b)(1), unless the authorization is terminated  or revoked sooner.       Influenza A by PCR NEGATIVE NEGATIVE Final   Influenza B by PCR NEGATIVE NEGATIVE Final    Comment: (NOTE) The Xpert Xpress SARS-CoV-2/FLU/RSV plus assay is intended as an aid in the diagnosis of influenza from Nasopharyngeal swab specimens and should not be used as a sole basis for treatment. Nasal washings and aspirates are unacceptable for Xpert Xpress SARS-CoV-2/FLU/RSV testing.  Fact Sheet for  Patients: EntrepreneurPulse.com.au  Fact Sheet for Healthcare Providers: IncredibleEmployment.be  This test is not yet approved or cleared by the [place] FDA and has been authorized for detection and/or diagnosis of SARS-CoV-2 by FDA under an Emergency Use Authorization (EUA). This EUA will remain in effect (meaning this test can be used) for the duration of the COVID-19 declaration under Section 564(b)(1) of the Act, 21 U.S.C. section 360bbb-3(b)(1), unless the authorization is terminated or revoked.  Performed at [facility], [place] [address] [place], [place] [address]      Scheduled Meds:  diltiazem  240 mg Oral Daily   folic acid  1 mg Oral Daily   pantoprazole  40 mg Oral Daily   sodium chloride flush  3 mL Intravenous Q12H   Continuous Infusions:  sodium chloride       LOS: 0 days    Time spent: 93min    Domenic Polite, MD [facility]   01/24/2021, 10:34 AM

## 2021-01-25 DIAGNOSIS — D696 Thrombocytopenia, unspecified: Secondary | ICD-10-CM | POA: Diagnosis not present

## 2021-01-25 LAB — CBC
HCT: 22.5 % — ABNORMAL LOW (ref 39.0–52.0)
Hemoglobin: 7.8 g/dL — ABNORMAL LOW (ref 13.0–17.0)
MCH: 34.1 pg — ABNORMAL HIGH (ref 26.0–34.0)
MCHC: 34.7 g/dL (ref 30.0–36.0)
MCV: 98.3 fL (ref 80.0–100.0)
Platelets: 38 10*3/uL — ABNORMAL LOW (ref 150–400)
RBC: 2.29 MIL/uL — ABNORMAL LOW (ref 4.22–5.81)
RDW: 21.5 % — ABNORMAL HIGH (ref 11.5–15.5)
WBC: 2.5 10*3/uL — ABNORMAL LOW (ref 4.0–10.5)
nRBC: 1.2 % — ABNORMAL HIGH (ref 0.0–0.2)

## 2021-01-25 LAB — PREPARE PLATELET PHERESIS: Unit division: 0

## 2021-01-25 LAB — BASIC METABOLIC PANEL
Anion gap: 8 (ref 5–15)
BUN: 25 mg/dL — ABNORMAL HIGH (ref 8–23)
CO2: 26 mmol/L (ref 22–32)
Calcium: 8.9 mg/dL (ref 8.9–10.3)
Chloride: 102 mmol/L (ref 98–111)
Creatinine, Ser: 1.37 mg/dL — ABNORMAL HIGH (ref 0.61–1.24)
GFR, Estimated: 52 mL/min — ABNORMAL LOW (ref >60)
Glucose, Bld: 120 mg/dL — ABNORMAL HIGH (ref 70–99)
Potassium: 4.1 mmol/L (ref 3.5–5.1)
Sodium: 136 mmol/L (ref 135–145)

## 2021-01-25 LAB — BPAM PLATELET PHERESIS
Blood Product Expiration Date: 202301022359
ISSUE DATE / TIME: 202212301625
Unit Type and Rh: 5100

## 2021-01-25 MED ORDER — TRIAMCINOLONE ACETONIDE 0.5 % EX CREA: TOPICAL_CREAM | Freq: Four times a day (QID) | CUTANEOUS | 0 refills | Status: AC | PRN

## 2021-01-25 MED ORDER — DILTIAZEM HCL ER COATED BEADS 240 MG PO CP24: 240.0000 mg | ORAL_CAPSULE | Freq: Every day | ORAL | 1 refills | Status: AC

## 2021-01-25 NOTE — Progress Notes (Signed)
Mobility Specialist Progress Note:   01/25/21 1000  Mobility  Activity Ambulated in hall  Level of Assistance Independent  Assistive Device None  Distance Ambulated (ft) 200 ft  Mobility Ambulated independently in hallway  Mobility Response Tolerated well  Mobility performed by Mobility specialist  $Mobility charge 1 Mobility   Met pt sitting in hallway, agreeable to mobility. Pt ambulates with RW at baseline per pt. Asx during ambulation, pt back sitting EOB with all needs met.   Nelta Numbers Mobility Specialist  Phone [phone]

## 2021-01-25 NOTE — Discharge Summary (Signed)
Physician Discharge Summary   Patient: Joe Hill MRN: [id number] DOB: @DOB   Admit date:     01/22/2021  Discharge date: 01/25/21  Discharge Physician: [name]   PCP: [name], MD   Recommendations at discharge: 1. Follow up with ENT, Dr. [name] in 1 week for removal of nasal packing 2. Follow up as scheduled with Dr. [name] at the [facility] 3. Remain off Eliquis until platelet counts further improved 4. 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 [name], COPD, chronic systolic CHF, history of partial vocal cord paralysis, paroxysmal atrial fibrillation on Eliquis, tobacco abuse was recently admitted with sepsis, healthcare associated pneumonia, discharged on 12/19.  He then presented to [name] ED on 12/28 with nosebleed, this stopped spontaneously and was subsequently discharged home. Came back to the ED and was admitted overnight for recurrence of epistaxis.  He was noted to have a platelet count of 16-22K, hemoglobin of 7.8, treated with TXA, thrombin pads, eventually required placement of an anterior nasal pack. -Admitted for observation and platelet transfusions    Recurrent epistaxis Severe thrombocytopenia -Suspect he has epistaxis secondary to severe thrombocytopenia, ongoing anticoagulation with Eliquis -Last chemo 12/15 -Platelet count dropped to as low as 16k, transfused 1 unit of  platelets  -Platelet count 36k and improving -Eliquis ON HOLD until platelets further improved. -Epistaxis has resolved following anterior nasal packing  -ENT follow-up with Dr. [name] in 1 week to remove this -Stable for Discharge home today   Acute on chronic systolic CHF -Last echo with EF of 45% -Has 2+ lower extremity edema, Dopplers were negative for DVT on 12/26 -Given IV Lasix x2 doses 12/31 -Stable and appears euvolemic today.   Paroxysmal atrial fibrillation -Resume Cardizem, Eliquis held in the setting of severe thrombocytopenia and epistaxis   Anemia, pancytopenia -Secondary to cancer/chemotherapy, hemoglobin relatively stable   Stage IV non-small cell lung cancer -Followed by Dr. [name], last chemo 12/15   COPD -Stable        Consultants: ENT Procedures performed: Nasal packing  Disposition: Home Diet recommendation: Regular diet  DISCHARGE MEDICATION: Allergies as of 01/25/2021       Reactions   Penicillins Hives   Did it involve swelling of the face/tongue/throat, SOB, or low BP? No Did it involve sudden or severe rash/hives, skin peeling, or any reaction on the inside of your mouth or nose? Yes Did you need to seek medical attention at a hospital or doctor's office? No When did it last happen?     childhood  If all above answers are "NO", may proceed with cephalosporin use.        Medication List     STOP taking these medications    apixaban 5 MG Tabs tablet Commonly known as: ELIQUIS   furosemide 40 MG tablet Commonly known as: Lasix   potassium chloride SA 20 MEQ tablet Commonly known as: KLOR-CON M   predniSONE 50 MG tablet Commonly known as: DELTASONE  TAKE these medications    albuterol 108 (90 Base) MCG/ACT inhaler Commonly known as: VENTOLIN HFA TAKE 2 PUFFS BY MOUTH EVERY 6 HOURS AS NEEDED FOR WHEEZE OR SHORTNESS OF BREATH What changed: See the new instructions.   dextromethorphan-guaiFENesin 30-600 MG 12hr  tablet Commonly known as: MUCINEX DM Take 1 tablet by mouth 2 (two) times daily as needed for cough.   diltiazem 240 MG 24 hr capsule Commonly known as: CARDIZEM CD Take 1 capsule (240 mg total) by mouth daily.   feeding supplement Liqd Take 1 Container by mouth 2 (two) times daily as needed (nutrition).   folic acid 1 MG tablet Commonly known as: FOLVITE Take 1 tablet (1 mg total) by mouth daily.   ibuprofen 400 MG tablet Commonly known as: ADVIL Take 400 mg by mouth every 6 (six) hours as needed for headache.   pantoprazole 40 MG tablet Commonly known as: PROTONIX Take 1 tablet (40 mg total) by mouth daily. What changed:  when to take this reasons to take this   prochlorperazine 10 MG tablet Commonly known as: COMPAZINE Take 1 tablet (10 mg total) by mouth every 6 (six) hours as needed for nausea or vomiting.   triamcinolone cream 0.5 % Commonly known as: KENALOG Apply topically 4 (four) times daily as needed (irritation to skin lower extremity).        Follow-up Information     [name], MD. Schedule an appointment as soon as possible for a visit in 1 week(s).   Specialty: Family Medicine Why: Hospital follow up for epistaxis and severe thrombocytopenia Contact information: [place] [address] [phone]         Curt Bears, MD. Go to.   Specialty: Oncology Why: Follow up as scheduled Contact information: [place] [address] [phone]         Jerrell Belfast, MD. Schedule an appointment as soon as possible for a visit in 1 week(s).   Specialty: Otolaryngology Why: 1 week follow up for nasal packing removal Contact information: [address] [place] [address] [phone]                 Discharge Exam: [name] Weights   01/22/21 2242  Weight: 79.4 kg   General exam: awake, alert, no acute distress HEENT: R nares with packing in place, moist  mucus membranes, hearing grossly normal  Respiratory system: CTAB, no wheezes, rales or rhonchi, normal respiratory effort. Cardiovascular system: normal S1/S2,  RRR Central nervous system: A&O x3. no gross focal neurologic deficits, normal speech Extremities: no edema, normal tone Skin: dry, intact, normal temperature Psychiatry: normal mood, congruent affect, judgement and insight appear normal   Condition at discharge: stable  The results of significant diagnostics from this hospitalization (including imaging, microbiology, ancillary and laboratory) are listed below for reference.   Imaging Studies: DG Chest 2 View  Result Date: 01/18/2021 CLINICAL DATA:  Shortness of breath. Currently receiving treatment for lung cancer. EXAM: CHEST - 2 VIEW COMPARISON:  01/16/2021. FINDINGS: The heart size and mediastinal contours are stable. There is a lobular density along the medial aspect of the right heart border, compatible with known pulmonary nodule. Emphysematous changes are present in the lungs bilaterally. There is chronic elevation of the left diaphragm with atelectasis or infiltrate at the left lung base. No effusion or. Degenerative changes are present in the thoracic spine. IMPRESSION: Stable chest with no acute cardiopulmonary process. Electronically Signed   By: Mickel Baas  Lovena Le M.D.   On: 01/18/2021 04:51   DG Chest 2 View  Result Date: 01/16/2021 CLINICAL DATA:  Shortness of breath, history of lung carcinoma EXAM: CHEST - 2 VIEW COMPARISON:  01/11/2021, CT from 12/27/2020 FINDINGS: Cardiac shadow is within normal limits. Patchy airspace opacity is again noted in the left base stable from the prior exam and prior CT examination. The known left upper lobe nodule is identified adjacent to the aortic knob. No new infiltrate or sizable effusion is seen. No acute bony abnormality is noted. IMPRESSION: Stable changes in the chest similar to that noted on prior CT. No acute abnormality is noted.  Electronically Signed   By: Inez Catalina M.D.   On: 01/16/2021 00:50   DG Ribs Unilateral W/Chest Left  Result Date: 12/26/2020 CLINICAL DATA:  Fall, left anterior rib pain EXAM: LEFT RIBS AND CHEST - 3+ VIEW COMPARISON:  Chest radiographs and CT chest, 12/24/2020 FINDINGS: No displaced fracture or other bone lesions are seen involving the ribs. There is no evidence of pneumothorax or pleural effusion. Heterogeneous airspace opacity of the left lung base. Unchanged elevation of the left hemidiaphragm. Heart size and mediastinal contours are within normal limits. IMPRESSION: 1. No displaced rib fracture. 2. Heterogeneous airspace opacity of the left lung base, consistent with infection or aspiration, as seen on prior. Electronically Signed   By: Delanna Ahmadi M.D.   On: 12/26/2020 12:14   CT Head Wo Contrast  Result Date: 12/26/2020 CLINICAL DATA:  Neck trauma. Head trauma, minor. Additional history provided: Unwitnessed syncopal episode and fall. Patient reports left rib pain, difficulty taking deep breaths. EXAM: CT HEAD WITHOUT CONTRAST CT CERVICAL SPINE WITHOUT CONTRAST TECHNIQUE: Multidetector CT imaging of the head and cervical spine was performed following the standard protocol without intravenous contrast. Multiplanar CT image reconstructions of the cervical spine were also generated. COMPARISON:  Brain MRI 10/09/2020. FINDINGS: CT HEAD FINDINGS Brain: Mild-to-moderate generalized cerebral atrophy. Comparatively mild cerebellar atrophy. 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 Background moderate/advanced patchy and ill-defined hypoattenuation within the cerebral white matter, nonspecific but compatible with chronic small vessel ischemic disease. There is no acute intracranial hemorrhage. No demarcated cortical infarct. No extra-axial fluid  collection. No evidence of an intracranial mass. No midline shift. Vascular: No hyperdense vessel.  Atherosclerotic calcifications. Skull: Normal. Negative for fracture or focal lesion. Sinuses/Orbits: Visualized orbits show no acute finding. Mild mucosal thickening within the bilateral ethmoid and maxillary sinuses at the imaged levels. CT CERVICAL SPINE FINDINGS Alignment: Straightening of the expected cervical lordosis. No significant spondylolisthesis. Skull base and vertebrae: The basion-dental and atlanto-dental intervals are maintained.No evidence of acute fracture to the cervical spine. Soft tissues and spinal canal: No prevertebral fluid or swelling. No visible canal hematoma. Disc levels: Cervical spondylosis with multilevel disc space narrowing, disc bulges, posterior disc osteophytes, endplate spurring, uncovertebral hypertrophy and facet arthrosis. Disc space narrowing is greatest at C5-C6, C6-C7 and T2-T3 (moderate at these levels). No appreciable levels of severe spinal canal stenosis. Multilevel bony neural foraminal narrowing. Multilevel ventral osteophytes. Upper chest: No consolidation within the imaged lung apices. No visible pneumothorax. Centrilobular and paraseptal emphysema with biapical bulla. Biapical pleuroparenchymal scarring. IMPRESSION: CT head: 1. No evidence of acute intracranial abnormality. 2. 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 3. Mild  paranasal sinus disease at the imaged levels. CT cervical spine: 1. No evidence of acute fracture to the cervical spine. 2. Straightening of the expected cervical lordosis. 3. Cervical spondylosis, as described. 4. Emphysema (ICD10-J43.9). Associated biapical pleuroparenchymal scarring and biapical bulla. Electronically Signed   By: Kellie Simmering D.O.   On: 12/26/2020 13:33   CT Chest W Contrast  Addendum Date: 12/27/2020    ADDENDUM REPORT: 12/27/2020 10:07 ADDENDUM: Upon reassessing imaging there is considerable artifact passing through the RIGHT lower chest and this study is venous phase. Despite images that show clear intraluminal low attenuation and high suspicion for pulmonary embolism there is question due to the artifact seen in this area. Furthermore, to help determine best future management in this complex patient would suggest a study performed in the appropriate phase, CT PE protocol with the arms up if possible. These results were called by telephone at the time of interpretation on 12/27/2020 at 10:07 am to provider Hosie Poisson MD, who verbally acknowledged these results. Electronically Signed   By: Zetta Bills M.D.   On: 12/27/2020 10:07   Result Date: 12/27/2020 CLINICAL DATA:  History of trauma, unwitnessed syncopal episode in an 81 year old male with history of pulmonary neoplasm. EXAM: CT CHEST WITH CONTRAST TECHNIQUE: Multidetector CT imaging of the chest was performed during intravenous contrast administration. CONTRAST:  47mL OMNIPAQUE IOHEXOL 350 MG/ML SOLN COMPARISON:  December 24, 2020 chest CT. FINDINGS: Cardiovascular: The aorta is normal caliber. Central pulmonary vasculature is normal caliber. Filling defects in the RIGHT lower lobe segmental and subsegmental branches to posterior basal segment (image 126/3) this is in an area of beam hardening artifact. The study is a venous phase evaluation not performed for PE assessment. No additional areas are demonstrated. Heart size is normal without pericardial effusion. There is straightening of the interventricular septum that was present previously. Mediastinum/Nodes: Interval decrease in size of proximally 2 cm LEFT paramediastinal mass contiguous with AP window soft tissue is stable in the short interval. Small lymph nodes throughout the remainder of the mediastinum likewise are unchanged. Lungs/Pleura: LEFT lower lobe nodularity and airspace process  showing no change in the short interval. Signs of pulmonary emphysema and LEFT upper lobe nodule as discussed. Nodular changes have developed in the RIGHT lower lobe, multifocal nodularity since the previous study. Stable background subsolid nodule (image 119/6) 13 x 15 mm, this was not present in August of 2022 nor in September and is therefore favored to represent post inflammatory process. There is mild septal thickening in the RIGHT upper lobe that is new from recent imaging. Mild bronchial wall thickening is present throughout the chest. Upper Abdomen: Incidental imaging of upper abdominal contents without acute process. 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 No acute upper abdominal process. Musculoskeletal: Signs of subacute sternal fracture, sclerotic margins without change, no substantial displacement. Spinal degenerative changes. Costochondral elements are intact. No signs of displaced rib fracture. Visualized clavicles and scapulae are intact. IMPRESSION: Small RIGHT lower lobe pulmonary emboli. Straightening of the interventricular septum is a stable finding and may relate to baseline elevated RIGHT heart pressures. Echocardiographic correlation may be helpful as warranted. Worsening of multifocal pneumonia. Signs of primary lung cancer in the LEFT chest with mediastinal involvement as described previously. 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 Sternal fracture favored to be subacute is new since September of 2022. Not substantially changed since December 24, 2020.  Aortic Atherosclerosis (ICD10-I70.0) and Emphysema (ICD10-J43.9). Critical Value/emergent results were called by telephone at the time of interpretation on 12/26/2020 at 1:42 pm to provider [facility] , who verbally acknowledged these results. Electronically Signed: By: Zetta Bills M.D. On: 12/26/2020 13:43    CT Angio Chest Pulmonary Embolism (PE) W or WO Contrast  Result Date: 12/27/2020 CLINICAL DATA:  PE suspected, high probability. Possible PE versus artifact identified incidentally on a CT scan of the chest with contrast from yesterday. EXAM: CT ANGIOGRAPHY CHEST WITH CONTRAST TECHNIQUE: Multidetector CT imaging of the chest was performed using the standard protocol during bolus administration of intravenous contrast. Multiplanar CT image reconstructions and MIPs were obtained to evaluate the vascular anatomy. CONTRAST:  34mL OMNIPAQUE IOHEXOL 350 MG/ML SOLN COMPARISON:  CT scan of the chest 12/26/2020 FINDINGS: Cardiovascular: Satisfactory opacification of the pulmonary arteries to the segmental level. No evidence of pulmonary embolism. Normal heart size. The previously questioned pulmonary emboli are not evident on today's exam and almost certainly represented focal beam hardening artifact. No pericardial effusion. Aortic and coronary artery atherosclerotic calcifications. Mediastinum/Nodes: Stable left paramediastinal mass measuring approximately 2 cm in contiguous with soft tissue in the AP window. Additionally, small scattered mediastinal lymph nodes are also unchanged compared to recent prior imaging. Lungs/Pleura: Continued stability of nonspecific left lower lobe nodularity and left upper lobe pulmonary nodule. Stable appearance of multifocal airspace opacifications in the right lower lobe. Background of moderately severe centrilobular pulmonary emphysema. Slightly increased right lower lobe atelectasis. No pleural effusion or pneumothorax. Overall, unchanged compared to yesterday. Upper Abdomen: No acute abnormality. Musculoskeletal: No acute abnormality. Review of the MIP images confirms the above findings. IMPRESSION: 1. Dedicated CTA imaging confirms that the previously suspected right lower lobe segmental pulmonary emboli were in fact artifactual. 2. Otherwise, unchanged appearance of multifocal  pneumonia and primary left chest lung cancer with mediastinal involvement. Aortic Atherosclerosis (ICD10-I70.0) and Emphysema (ICD10-J43.9). Electronically Signed   By: Jacqulynn Cadet M.D.   On: 12/27/2020 13:38   CT Cervical Spine Wo Contrast  Result Date: 12/26/2020 CLINICAL DATA:  Neck trauma. Head trauma, minor. Additional history provided: Unwitnessed syncopal episode and fall. Patient reports left rib pain, difficulty taking deep breaths. EXAM: CT HEAD WITHOUT CONTRAST CT CERVICAL SPINE WITHOUT CONTRAST TECHNIQUE: Multidetector CT imaging of the head and cervical spine was performed following the standard protocol without intravenous contrast. Multiplanar CT image reconstructions of the cervical spine were also generated. COMPARISON:  Brain MRI 10/09/2020. FINDINGS: CT HEAD FINDINGS Brain: Mild-to-moderate generalized cerebral atrophy. Comparatively mild cerebellar atrophy. 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 Background moderate/advanced patchy and ill-defined hypoattenuation within the cerebral white matter, nonspecific but compatible with chronic small vessel ischemic disease. There is no acute intracranial hemorrhage. No demarcated cortical infarct. No extra-axial fluid collection. No evidence of an intracranial mass. No midline shift. Vascular: No hyperdense vessel.  Atherosclerotic calcifications. Skull: Normal. Negative for fracture or focal lesion. Sinuses/Orbits: Visualized orbits show no acute finding. Mild mucosal thickening within the bilateral ethmoid and maxillary sinuses at the imaged levels. CT CERVICAL SPINE FINDINGS Alignment: Straightening of the expected cervical lordosis. No significant spondylolisthesis. Skull base and vertebrae: The basion-dental and atlanto-dental intervals are maintained.No evidence of acute fracture to the  cervical spine. Soft tissues and spinal canal: No prevertebral fluid or swelling. No visible canal hematoma. Disc levels: Cervical spondylosis with multilevel disc  space narrowing, disc bulges, posterior disc osteophytes, endplate spurring, uncovertebral hypertrophy and facet arthrosis. Disc space narrowing is greatest at C5-C6, C6-C7 and T2-T3 (moderate at these levels). No appreciable levels of severe spinal canal stenosis. Multilevel bony neural foraminal narrowing. Multilevel ventral osteophytes. Upper chest: No consolidation within the imaged lung apices. No visible pneumothorax. Centrilobular and paraseptal emphysema with biapical bulla. Biapical pleuroparenchymal scarring. IMPRESSION: CT head: 1. No evidence of acute intracranial abnormality. 2. 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 3. Mild paranasal sinus disease at the imaged levels. CT cervical spine: 1. No evidence of acute fracture to the cervical spine. 2. Straightening of the expected cervical lordosis. 3. Cervical spondylosis, as described. 4. Emphysema (ICD10-J43.9). Associated biapical pleuroparenchymal scarring and biapical bulla. Electronically Signed   By: Kellie Simmering D.O.   On: 12/26/2020 13:33   DG Chest Portable 1 View  Result Date: 01/11/2021 CLINICAL DATA:  Hypoxemia. EXAM: PORTABLE CHEST 1 VIEW COMPARISON:  Chest x-rays since December 24, 2020. CT scan of the chest December 26, 2020. FINDINGS: Continued infiltrate is seen in the left base, not significantly changed. No pneumothorax. The cardiomediastinal silhouette is stable. The lungs are unchanged. IMPRESSION: Continued infiltrate in the left base, not significantly changed since December 24, 2020. Pneumonia and aspiration are leading considerations. Recommend clinical correlation and follow-up to complete resolution. Electronically Signed   By: Dorise Bullion III  M.D.   On: 01/11/2021 10:20   ECHOCARDIOGRAM COMPLETE  Result Date: 12/27/2020    ECHOCARDIOGRAM REPORT   Patient Name:   EATHAN GROMAN Date of Exam: 12/27/2020 Medical Rec #:  [id number]      Height:       70.0 in Accession #:    [id number]     Weight:       173.5 lb Date of Birth:  [birth year]      BSA:          1.965 m Patient Age:    59 years       BP:           106/73 mmHg Patient Gender: M              HR:           81 bpm. Exam Location:  Inpatient Procedure: 2D Echo, Cardiac Doppler and Color Doppler Indications:    Pulmonary Embolus I26.09  History:        Patient has no prior history of Echocardiogram examinations.                 COPD; Risk Factors:Hypertension.  Sonographer:    Bernadene Person RDCS Referring Phys: [id number] Parchment  1. Left ventricular ejection fraction, by estimation, is 40 to 45%. The left ventricle has mildly decreased function. The left ventricle demonstrates global hypokinesis. The left ventricular internal cavity size was mildly dilated. Left ventricular diastolic function could not be evaluated.  2. Right ventricular systolic function is normal. The right ventricular size is normal. There is normal pulmonary artery systolic pressure.  3. Left atrial size was mildly dilated.  4. Right atrial size was mildly dilated.  5. The mitral valve is normal in structure. Trivial mitral valve regurgitation.  6. The aortic valve is calcified. There is mild calcification of the aortic valve. There is mild thickening of the aortic valve. Aortic valve regurgitation is mild to moderate.  7. Aortic dilatation noted. There is  borderline dilatation of the ascending aorta, measuring 39 mm. FINDINGS  Left Ventricle: Left ventricular ejection fraction, by estimation, is 40 to 45%. The left ventricle has mildly decreased function. The left ventricle demonstrates global hypokinesis. The left ventricular internal cavity size was mildly dilated. There is  no left ventricular  hypertrophy. Left ventricular diastolic function could not be evaluated due to atrial fibrillation. Left ventricular diastolic function could not be evaluated. Right Ventricle: The right ventricular size is normal. Right vetricular wall thickness was not well visualized. Right ventricular systolic function is normal. There is normal pulmonary artery systolic pressure. The tricuspid regurgitant velocity is 2.18 m/s, and with an assumed right atrial pressure of 3 mmHg, the estimated right ventricular systolic pressure is 85.2 mmHg. Left Atrium: Left atrial size was mildly dilated. Right Atrium: Right atrial size was mildly dilated. Pericardium: There is no evidence of pericardial effusion. Mitral Valve: The mitral valve is normal in structure. Trivial mitral valve regurgitation. Tricuspid Valve: The tricuspid valve is grossly normal. Tricuspid valve regurgitation is trivial. Aortic Valve: The aortic valve is calcified. There is mild calcification of the aortic valve. There is mild thickening of the aortic valve. There is mild aortic valve annular calcification. Aortic valve regurgitation is mild to moderate. Aortic regurgitation PHT measures 326 msec. Pulmonic Valve: The pulmonic valve was not well visualized. Pulmonic valve regurgitation is not visualized. Aorta: Aortic dilatation noted. There is borderline dilatation of the ascending aorta, measuring 39 mm. IAS/Shunts: The atrial septum is grossly normal.  LEFT VENTRICLE PLAX 2D LVIDd:         5.00 cm LVIDs:         4.20 cm LV PW:         1.00 cm LV IVS:        1.00 cm LVOT diam:     2.20 cm LV SV:         69 LV SV Index:   35 LVOT Area:     3.80 cm  LV Volumes (MOD) LV vol d, MOD A2C: 126.0 ml LV vol d, MOD A4C: 96.6 ml LV vol s, MOD A2C: 70.9 ml LV vol s, MOD A4C: 58.1 ml LV SV MOD A2C:     55.1 ml LV SV MOD A4C:     96.6 ml LV SV MOD BP:      49.7 ml RIGHT VENTRICLE TAPSE (M-mode): 1.7 cm LEFT ATRIUM             Index        RIGHT ATRIUM           Index LA diam:         4.60 cm 2.34 cm/m   RA Area:     19.10 cm LA Vol (A2C):   62.9 ml 32.01 ml/m  RA Volume:   56.70 ml  28.86 ml/m LA Vol (A4C):   57.7 ml 29.36 ml/m LA Biplane Vol: 66.3 ml 33.74 ml/m  AORTIC VALVE LVOT Vmax:   99.30 cm/s LVOT Vmean:  66.200 cm/s LVOT VTI:    0.182 m AI PHT:      326 msec  AORTA Ao Root diam: 3.90 cm Ao Asc diam:  3.90 cm TRICUSPID VALVE TR Peak grad:   19.0 mmHg TR Vmax:        218.00 cm/s  SHUNTS Systemic VTI:  0.18 m Systemic Diam: 2.20 cm Mertie Moores MD Electronically signed by Mertie Moores MD Signature Date/Time: 12/27/2020/4:31:49 PM    Final    VAS [place]  LOWER EXTREMITY VENOUS (DVT) (ONLY [facility] & [facility])  Result Date: 01/19/2021  Lower Venous DVT Study Patient Name:  ADVAY VOLANTE  Date of Exam:   01/19/2021 Medical Rec #: [id number]       Accession #:    [id number] Date of Birth: 22-May-1940       Patient Gender: M Patient Age:   26 years Exam Location:  [facility] Procedure:      VAS [place] LOWER EXTREMITY VENOUS (DVT) Referring Phys: Benjamine Mola REES --------------------------------------------------------------------------------  Indications: Edema.  Limitations: Poor ultrasound/tissue interface and body habitus. Comparison Study: 01/01/2021- negative lower extremity venous duplex Performing Technologist: Maudry Mayhew MHA, RDMS, RVT, RDCS  Examination Guidelines: A complete evaluation includes B-mode imaging, spectral Doppler, color Doppler, and power Doppler as needed of all accessible portions of each vessel. Bilateral testing is considered an integral part of a complete examination. Limited examinations for reoccurring indications may be performed as noted. The reflux portion of the exam is performed with the patient in reverse Trendelenburg.  +-----+---------------+---------+-----------+----------+--------------+  RIGHT Compressibility Phasicity Spontaneity Properties Thrombus Aging  +-----+---------------+---------+-----------+----------+--------------+  CFV   Full             Yes       Yes                                    +-----+---------------+---------+-----------+----------+--------------+   +---------+---------------+---------+-----------+----------+--------------+  LEFT      Compressibility Phasicity Spontaneity Properties Thrombus Aging  +---------+---------------+---------+-----------+----------+--------------+  CFV       Full            Yes       Yes                                    +---------+---------------+---------+-----------+----------+--------------+  SFJ       Full                                                             +---------+---------------+---------+-----------+----------+--------------+  FV Prox   Full                                                             +---------+---------------+---------+-----------+----------+--------------+  FV Mid    Full                                                             +---------+---------------+---------+-----------+----------+--------------+  FV Distal Full                                                             +---------+---------------+---------+-----------+----------+--------------+  PFV       Full                                                             +---------+---------------+---------+-----------+----------+--------------+  POP       Full            Yes       Yes                                    +---------+---------------+---------+-----------+----------+--------------+  PTV       Full                                                             +---------+---------------+---------+-----------+----------+--------------+  PERO      Full                                                             +---------+---------------+---------+-----------+----------+--------------+    Summary: RIGHT: - No evidence of common femoral vein obstruction.  LEFT: - There is no evidence of deep vein thrombosis in the lower extremity.  - No cystic structure found in the popliteal fossa.  *See table(s) above  for measurements and observations. Electronically signed by Jamelle Haring on 01/19/2021 at 1:05:26 PM.    Final    VAS [place] LOWER EXTREMITY VENOUS (DVT)  Result Date: 01/01/2021  Lower Venous DVT Study Patient Name:  BERND CROM  Date of Exam:   01/01/2021 Medical Rec #: [id number]       Accession #:    [id number] Date of Birth: Nov 22, 1940       Patient Gender: M Patient Age:   12 years Exam Location:  [facility] Procedure:      VAS [place] LOWER EXTREMITY VENOUS (DVT) Referring Phys: ERIC [place] --------------------------------------------------------------------------------  Indications: Edema.  Risk Factors: Cancer. Comparison Study: No prior studies. Performing Technologist: Oliver Hum RVT  Examination Guidelines: A complete evaluation includes B-mode imaging, spectral Doppler, color Doppler, and power Doppler as needed of all accessible portions of each vessel. Bilateral testing is considered an integral part of a complete examination. Limited examinations for reoccurring indications may be performed as noted. The reflux portion of the exam is performed with the patient in reverse Trendelenburg.  +---------+---------------+---------+-----------+----------+--------------+  RIGHT     Compressibility Phasicity Spontaneity Properties Thrombus Aging  +---------+---------------+---------+-----------+----------+--------------+  CFV       Full            Yes       Yes                                    +---------+---------------+---------+-----------+----------+--------------+  SFJ       Full                                                             +---------+---------------+---------+-----------+----------+--------------+  FV Prox   Full                                                             +---------+---------------+---------+-----------+----------+--------------+  FV Mid    Full                                                              +---------+---------------+---------+-----------+----------+--------------+  FV Distal Full                                                             +---------+---------------+---------+-----------+----------+--------------+  PFV       Full                                                             +---------+---------------+---------+-----------+----------+--------------+  POP       Full            Yes       Yes                                    +---------+---------------+---------+-----------+----------+--------------+  PTV       Full                                                             +---------+---------------+---------+-----------+----------+--------------+  PERO      Full                                                             +---------+---------------+---------+-----------+----------+--------------+   +---------+---------------+---------+-----------+----------+--------------+  LEFT      Compressibility Phasicity Spontaneity Properties Thrombus Aging  +---------+---------------+---------+-----------+----------+--------------+  CFV       Full            Yes       Yes                                    +---------+---------------+---------+-----------+----------+--------------+  SFJ       Full                                                             +---------+---------------+---------+-----------+----------+--------------+  FV Prox   Full                                                             +---------+---------------+---------+-----------+----------+--------------+  FV Mid    Full                                                             +---------+---------------+---------+-----------+----------+--------------+  FV Distal Full                                                             +---------+---------------+---------+-----------+----------+--------------+  PFV       Full                                                              +---------+---------------+---------+-----------+----------+--------------+  POP       Full            Yes       Yes                                    +---------+---------------+---------+-----------+----------+--------------+  PTV       Full                                                             +---------+---------------+---------+-----------+----------+--------------+  PERO      Full                                                             +---------+---------------+---------+-----------+----------+--------------+     Summary: RIGHT: - There is no evidence of deep vein thrombosis in the lower extremity.  - No cystic structure found in the popliteal fossa.  LEFT: - There is no evidence of deep vein thrombosis in the lower extremity.  - No cystic structure found in the popliteal fossa.  *See table(s) above for measurements and observations. Electronically signed by Orlie Pollen on 01/01/2021 at 5:17:49 PM.    Final     Microbiology: Results for orders placed or performed during the hospital encounter of 01/22/21  Resp Panel by RT-PCR (Flu A&B, Covid) Nasopharyngeal Swab     Status: None   Collection Time: 01/23/21  5:26 PM   Specimen: Nasopharyngeal Swab; Nasopharyngeal(NP) swabs in vial transport  medium  Result Value Ref Range Status   SARS Coronavirus 2 by RT PCR NEGATIVE NEGATIVE Final    Comment: (NOTE) SARS-CoV-2 target nucleic acids are NOT DETECTED.  The SARS-CoV-2 RNA is generally detectable in upper respiratory specimens during the acute phase of infection. The lowest concentration of SARS-CoV-2 viral copies this assay can detect is 138 copies/mL. A negative result does not preclude SARS-Cov-2 infection and should not be used as the sole basis for treatment or other patient management decisions. A negative result may occur with  improper specimen collection/handling, submission of specimen other than nasopharyngeal swab, presence of viral mutation(s) within the areas targeted  by this assay, and inadequate number of viral copies(<138 copies/mL). A negative result must be combined with clinical observations, patient history, and epidemiological information. The expected result is Negative.  Fact Sheet for Patients:  EntrepreneurPulse.com.au  Fact Sheet for Healthcare Providers:  IncredibleEmployment.be  This test is no t yet approved or cleared by the [place] FDA and  has been authorized for detection and/or diagnosis of SARS-CoV-2 by FDA under an Emergency Use Authorization (EUA). This EUA will remain  in effect (meaning this test can be used) for the duration of the COVID-19 declaration under Section 564(b)(1) of the Act, 21 U.S.C.section 360bbb-3(b)(1), unless the authorization is terminated  or revoked sooner.       Influenza A by PCR NEGATIVE NEGATIVE Final   Influenza B by PCR NEGATIVE NEGATIVE Final    Comment: (NOTE) The Xpert Xpress SARS-CoV-2/FLU/RSV plus assay is intended as an aid in the diagnosis of influenza from Nasopharyngeal swab specimens and should not be used as a sole basis for treatment. Nasal washings and aspirates are unacceptable for Xpert Xpress SARS-CoV-2/FLU/RSV testing.  Fact Sheet for Patients: EntrepreneurPulse.com.au  Fact Sheet for Healthcare Providers: IncredibleEmployment.be  This test is not yet approved or cleared by the [place] FDA and has been authorized for detection and/or diagnosis of SARS-CoV-2 by FDA under an Emergency Use Authorization (EUA). This EUA will remain in effect (meaning this test can be used) for the duration of the COVID-19 declaration under Section 564(b)(1) of the Act, 21 U.S.C. section 360bbb-3(b)(1), unless the authorization is terminated or revoked.  Performed at [facility], [place] [address], [place], [place] [address]     Labs: CBC: Recent Labs  Lab 01/19/21 0449  01/22/21 1548 01/23/21 1135 01/24/21 0114 01/24/21 1226 01/25/21 0104  WBC 4.1 3.3* 2.5* 2.3* 2.6* 2.5*  NEUTROABS 3.3 2.9 1.5*  --   --   --   HGB 8.1* 8.2* 7.8* 7.9* 8.0* 7.8*  HCT 24.3* 24.9* 23.3* 23.3* 23.0* 22.5*  MCV 98.4 98.8 100.4* 99.6 97.5 98.3  PLT 87* 22* 16* 31* 36* 38*   Basic Metabolic Panel: Recent Labs  Lab 01/19/21 0449 01/22/21 1602 01/23/21 1135 01/24/21 0114 01/25/21 0104  NA 137 138 137 139 136  K 4.1 4.7 3.4* 3.7 4.1  CL 101 104 102 102 102  CO2 27 27 29 28 26   GLUCOSE 153* 228* 123* 103* 120*  BUN 42* 33* 31* 24* 25*  CREATININE 1.08 1.16 1.05 1.22 1.37*  CALCIUM 8.9 9.1 8.6* 8.7* 8.9   Liver Function Tests: Recent Labs  Lab 01/19/21 0449 01/22/21 1602 01/24/21 0114  AST 24 29 29   ALT 25 47* 43  ALKPHOS 107 106 85  BILITOT 1.2 0.8 1.3*  PROT 6.8 6.5 5.8*  ALBUMIN 3.7 3.6 3.3*   CBG: No results for input(s): GLUCAP in the last 168  hours.  Discharge time spent: less than 30 minutes.  Signed:  [name] MD.  [facility] 01/25/2021

## 2021-01-25 NOTE — Progress Notes (Addendum)
Pt has been restless entire shift. Slept very little. Frequently walking in hall stating " I want to go home."  Pt refused to wear Tele most of night. I was just allowed to place Tele back on him. X Blount notified earlier in shift of pt refusing to wear Tele. Will continue to monitor.

## 2021-01-25 NOTE — TOC Transition Note (Addendum)
Transition of Care [facility]) - CM/SW Discharge Note   Patient Details  Name: Joe Hill MRN: [id number] Date of Birth: [birth year]  Transition of [facility]) CM/SW Contact:  Bartholomew Crews, RN Phone Number: [phone] 01/25/2021, 11:01 AM   Clinical Narrative:     Spoke with patient at the bedside to discuss [facility] consult that he needs a ride to [facility] to get his car. Discussed transportation and verified mobile phone number. Rider waiver signed and placed in shadow chart. Patient stated that his son lives in [place] and is not available to assist with getting him to his car. CM offered to call son, patient declined. Spoke with nursing to let [facility] know when patient is ready. No further TOC needs identified.   12:20pm - Ride arranged via Engineer, maintenance  Final next level of care: Home/Self Care Barriers to Discharge: No Barriers Identified   Patient Goals and CMS Choice Patient states their goals for this hospitalization and ongoing recovery are:: return home CMS Medicare.gov Compare Post Acute Care list provided to:: Patient Choice offered to / list presented to : Patient  Discharge Placement                       Discharge Plan and Services                DME Arranged: N/A DME Agency: NA         HH Agency: NA        Social Determinants of Health (SDOH) Interventions     Readmission Risk Interventions No flowsheet data found.

## 2021-01-26 ENCOUNTER — Encounter (HOSPITAL_COMMUNITY): Payer: Self-pay

## 2021-01-26 ENCOUNTER — Emergency Department (HOSPITAL_COMMUNITY): Payer: Medicare Other

## 2021-01-26 ENCOUNTER — Other Ambulatory Visit: Payer: Self-pay

## 2021-01-26 ENCOUNTER — Emergency Department (HOSPITAL_COMMUNITY)
Admission: EM | Admit: 2021-01-26 | Discharge: 2021-01-27 | Disposition: A | Discharge status: Home / Self Care | Payer: Medicare Other | Source: Home / Self Care | Attending: Emergency Medicine | Admitting: Emergency Medicine

## 2021-01-26 ENCOUNTER — Emergency Department (HOSPITAL_COMMUNITY)
Admission: EM | Admit: 2021-01-26 | Discharge: 2021-01-26 | Disposition: A | Discharge status: Home / Self Care | Payer: Medicare Other | Attending: Emergency Medicine | Admitting: Emergency Medicine

## 2021-01-26 DIAGNOSIS — R040 Epistaxis: Secondary | ICD-10-CM | POA: Diagnosis not present

## 2021-01-26 DIAGNOSIS — G4700 Insomnia, unspecified: Secondary | ICD-10-CM | POA: Diagnosis not present

## 2021-01-26 DIAGNOSIS — R0602 Shortness of breath: Secondary | ICD-10-CM | POA: Insufficient documentation

## 2021-01-26 DIAGNOSIS — Z5321 Procedure and treatment not carried out due to patient leaving prior to being seen by health care provider: Secondary | ICD-10-CM | POA: Insufficient documentation

## 2021-01-26 DIAGNOSIS — T171XXA Foreign body in nostril, initial encounter: Secondary | ICD-10-CM | POA: Diagnosis not present

## 2021-01-26 DIAGNOSIS — L03116 Cellulitis of left lower limb: Secondary | ICD-10-CM

## 2021-01-26 DIAGNOSIS — Z79899 Other long term (current) drug therapy: Secondary | ICD-10-CM | POA: Diagnosis not present

## 2021-01-26 DIAGNOSIS — I4891 Unspecified atrial fibrillation: Secondary | ICD-10-CM | POA: Insufficient documentation

## 2021-01-26 DIAGNOSIS — Z7901 Long term (current) use of anticoagulants: Secondary | ICD-10-CM | POA: Insufficient documentation

## 2021-01-26 DIAGNOSIS — J449 Chronic obstructive pulmonary disease, unspecified: Secondary | ICD-10-CM | POA: Diagnosis not present

## 2021-01-26 DIAGNOSIS — R609 Edema, unspecified: Secondary | ICD-10-CM

## 2021-01-26 DIAGNOSIS — R918 Other nonspecific abnormal finding of lung field: Secondary | ICD-10-CM | POA: Diagnosis not present

## 2021-01-26 DIAGNOSIS — Z4800 Encounter for change or removal of nonsurgical wound dressing: Secondary | ICD-10-CM | POA: Diagnosis not present

## 2021-01-26 DIAGNOSIS — J9811 Atelectasis: Secondary | ICD-10-CM | POA: Diagnosis not present

## 2021-01-26 LAB — CBC WITH DIFFERENTIAL/PLATELET
Abs Immature Granulocytes: 0.01 10*3/uL (ref 0.00–0.07)
Basophils Absolute: 0 10*3/uL (ref 0.0–0.1)
Basophils Relative: 0 %
Eosinophils Absolute: 0 10*3/uL (ref 0.0–0.5)
Eosinophils Relative: 0 %
HCT: 21 % — ABNORMAL LOW (ref 39.0–52.0)
Hemoglobin: 7.1 g/dL — ABNORMAL LOW (ref 13.0–17.0)
Immature Granulocytes: 1 %
Lymphocytes Relative: 27 %
Lymphs Abs: 0.5 10*3/uL — ABNORMAL LOW (ref 0.7–4.0)
MCH: 34.5 pg — ABNORMAL HIGH (ref 26.0–34.0)
MCHC: 33.8 g/dL (ref 30.0–36.0)
MCV: 101.9 fL — ABNORMAL HIGH (ref 80.0–100.0)
Monocytes Absolute: 0.4 10*3/uL (ref 0.1–1.0)
Monocytes Relative: 21 %
Neutro Abs: 1 10*3/uL — ABNORMAL LOW (ref 1.7–7.7)
Neutrophils Relative %: 51 %
Platelets: 58 10*3/uL — ABNORMAL LOW (ref 150–400)
RBC: 2.06 MIL/uL — ABNORMAL LOW (ref 4.22–5.81)
RDW: 23.2 % — ABNORMAL HIGH (ref 11.5–15.5)
WBC: 1.9 10*3/uL — ABNORMAL LOW (ref 4.0–10.5)
nRBC: 2.7 % — ABNORMAL HIGH (ref 0.0–0.2)

## 2021-01-26 LAB — COMPREHENSIVE METABOLIC PANEL
ALT: 31 U/L (ref 0–44)
AST: 21 U/L (ref 15–41)
Albumin: 3.4 g/dL — ABNORMAL LOW (ref 3.5–5.0)
Alkaline Phosphatase: 98 U/L (ref 38–126)
Anion gap: 3 — ABNORMAL LOW (ref 5–15)
BUN: 28 mg/dL — ABNORMAL HIGH (ref 8–23)
CO2: 27 mmol/L (ref 22–32)
Calcium: 8.4 mg/dL — ABNORMAL LOW (ref 8.9–10.3)
Chloride: 107 mmol/L (ref 98–111)
Creatinine, Ser: 1.26 mg/dL — ABNORMAL HIGH (ref 0.61–1.24)
GFR, Estimated: 58 mL/min — ABNORMAL LOW (ref >60)
Glucose, Bld: 168 mg/dL — ABNORMAL HIGH (ref 70–99)
Potassium: 3.5 mmol/L (ref 3.5–5.1)
Sodium: 137 mmol/L (ref 135–145)
Total Bilirubin: 1 mg/dL (ref 0.3–1.2)
Total Protein: 6.1 g/dL — ABNORMAL LOW (ref 6.5–8.1)

## 2021-01-26 MED ORDER — CLINDAMYCIN PHOSPHATE 600 MG/50ML IV SOLN
600.0000 mg | Freq: Once | INTRAVENOUS | Status: AC
  Administered 2021-01-27: 600 mg via INTRAVENOUS
  Filled 2021-01-26: qty 50

## 2021-01-26 NOTE — ED Triage Notes (Signed)
Patient arrived stating he hasnt been able to sleep for 3 days and having difficulty relaxing. Patient seen here for nosebleeds recently.

## 2021-01-26 NOTE — ED Triage Notes (Signed)
Pt was seen last week for a nose bleed, his nose was packed, and now he has complaints of not being able to breath out of one nostril. Pt has swelling and redness to his left leg.

## 2021-01-26 NOTE — ED Provider Notes (Signed)
Emergency Medicine Provider Triage Evaluation Note  ABDIFATAH COLQUHOUN , a 81 y.o. male  was evaluated in triage.  Pt complains of shortness of breath. Patient related his shortness of breath to his nasal packing in his left nostril. Patient had a nose bleed last week where he underwent nasal packing; however has not been evaluated by ENT for removal yet. Denies chest pain. 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 Patient notes he was seen in the ED earlier today and was found to be hypoxic and placed on oxygen; however left due to long wait times? He is not on chronic oxygen. History of PE.  Review of Systems  Positive: SOB Negative: CP  Physical Exam  BP 109/68 (BP Location: Left Arm)    Pulse 100    Temp 98.7 F (37.1 C) (Oral)    Resp 16    SpO2 100%  Gen:   Awake, no distress   Resp:  Normal effort  MSK:   Moves extremities without difficulty  Other:    Medical Decision Making  Medically screening exam initiated at 10:50 PM.  Appropriate orders placed.  RAUDEL BAZEN was informed that the remainder of the evaluation will be completed by another provider, this initial triage assessment does not replace that evaluation, and the importance of remaining in the ED until their evaluation is complete.  Labs CXR EKG   Karie Kirks 01/26/21 2254    Drenda Freeze, MD 01/30/21 [phone]

## 2021-01-26 NOTE — ED Provider Notes (Signed)
[place] DEPT Provider Note   CSN: [id number] Arrival date & time: 01/26/21  2035     History  Chief Complaint  Patient presents with   Leg Swelling   Shortness of Breath    Joe Hill is a 81 y.o. male.  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  Patient had nasal packing placed and is now having difficulty breathing he believes is related to the nasal packing.  He is due to see ENT tomorrow for removal.  Patient denies to me he is having any chest pain, fevers, or chills.  He does report some swelling to his legs, left greater than right along with some erythema to the left leg.  He has had 2 DVT studies in the past 2 weeks, both of which were unremarkable.  He denies to me he is having any chest pain.  The history is provided by the patient.  Shortness of Breath Severity:  Moderate Onset quality:  Gradual Duration:  2 days Timing:  Constant Progression:  Partially resolved Chronicity:  New Relieved by:  Nothing Worsened by:  Nothing Ineffective treatments:  None tried     Home Medications Prior to Admission medications   Medication Sig Start Date End Date Taking? Authorizing Provider  albuterol (VENTOLIN HFA) 108 (90 Base) MCG/ACT inhaler TAKE 2 PUFFS BY MOUTH EVERY 6 HOURS AS NEEDED FOR WHEEZE OR SHORTNESS OF BREATH Patient taking differently: 2 puffs every 6 (six) hours as needed for wheezing or shortness of breath. 01/07/21   [name], MD  dextromethorphan-guaiFENesin [facility] DM) 30-600 MG 12hr tablet Take 1 tablet by mouth 2 (two) times daily as needed for cough.    [provider]  diltiazem (CARDIZEM CD) 240 MG 24 hr capsule Take 1 capsule (240 mg total) by mouth daily. 01/25/21   [name], DO  feeding supplement (BOOST HIGH PROTEIN) LIQD Take  1 Container by mouth 2 (two) times daily as needed (nutrition).    [provider]  folic acid (FOLVITE) 1 MG tablet Take 1 tablet (1 mg total) by mouth daily. 11/18/20   Curt Bears, MD  ibuprofen (ADVIL) 400 MG tablet Take 400 mg by mouth every 6 (six) hours as needed for headache.    [provider]  pantoprazole (PROTONIX) 40 MG tablet Take 1 tablet (40 mg total) by mouth daily. Patient taking differently: Take 40 mg by mouth daily as needed (for acid reflux). 01/02/21 04/02/21  [place] Donnamarie Poag, DO  prochlorperazine (COMPAZINE) 10 MG tablet Take 1 tablet (10 mg total) by mouth every 6 (six) hours as needed for nausea or vomiting. 11/18/20   Curt Bears, MD  triamcinolone cream (KENALOG) 0.5 % Apply topically 4 (four) times daily as needed (irritation to skin lower extremity). 01/25/21   [name], DO      Allergies    Penicillins    Review of Systems   Review of Systems  Respiratory:  Positive for shortness of breath.   All other systems reviewed and are negative.  Physical Exam Updated Vital Signs BP 114/69    Pulse 96    Temp 98.7 F (37.1 C) (Oral)    Resp (!) 32    SpO2 100%  Physical Exam Vitals and nursing note reviewed.  Constitutional:      General: He is not in  acute distress.    Appearance: He is well-developed. He is not diaphoretic.  HENT:     Head: Normocephalic and atraumatic.  Cardiovascular:     Rate and Rhythm: Normal rate. Rhythm irregular.     Heart sounds: No murmur heard.   No friction rub.  Pulmonary:     Effort: Pulmonary effort is normal. No respiratory distress.     Breath sounds: Normal breath sounds. No wheezing or rales.  Abdominal:     General: Bowel sounds are normal. There is no distension.     Palpations: Abdomen is soft.     Tenderness: There is no abdominal tenderness.  Musculoskeletal:        General: Normal range of motion.     Cervical back: Normal range of motion and neck supple.     Right lower leg: No  tenderness. Edema present.     Left lower leg: Tenderness present. Edema present.     Comments: There is 1+ edema in the right lower extremity.  The left lower extremity has 2+ edema and erythema in a stocking distribution.  There is no calf tenderness.  Joe Hill' sign is absent.  Skin:    General: Skin is warm and dry.  Neurological:     Mental Status: He is alert and oriented to person, place, and time.     Coordination: Coordination normal.    ED Results / Procedures / Treatments   Labs (all labs ordered are listed, but only abnormal results are displayed) Labs Reviewed  CBC WITH DIFFERENTIAL/PLATELET - Abnormal; Notable for the following components:      Result Value   WBC 1.9 (*)    RBC 2.06 (*)    Hemoglobin 7.1 (*)    HCT 21.0 (*)    MCV 101.9 (*)    MCH 34.5 (*)    RDW 23.2 (*)    Platelets 58 (*)    nRBC 2.7 (*)    Neutro Abs 1.0 (*)    Lymphs Abs 0.5 (*)    All other components within normal limits  COMPREHENSIVE METABOLIC PANEL - Abnormal; Notable for the following components:   Glucose, Bld 168 (*)    BUN 28 (*)    Creatinine, Ser 1.26 (*)    Calcium 8.4 (*)    Total Protein 6.1 (*)    Albumin 3.4 (*)    GFR, Estimated 58 (*)    Anion gap 3 (*)    All other components within normal limits  BRAIN NATRIURETIC PEPTIDE    EKG EKG Interpretation  Date/Time:  Monday January 26 2021 23:07:48 EST Ventricular Rate:  89 PR Interval:    QRS Duration: 92 QT Interval:  379 QTC Calculation: 438 R Axis:   26 Text Interpretation: Atrial fibrillation Ventricular premature complex Anteroseptal infarct, old Nonspecific repol abnormality, lateral leads Confirmed by Veryl Speak [phone]) on 01/27/2021 12:05:51 AM  Radiology DG Chest 1 View  Result Date: 01/26/2021 CLINICAL DATA:  Shortness of breath. EXAM: CHEST  1 VIEW COMPARISON:  Chest x-ray 01/16/2021. FINDINGS: There is stable mild elevation of the left hemidiaphragm. Linear left basilar atelectasis is present is  decreased from prior. There is no new focal lung infiltrate. Costophrenic angles are clear. There is no pneumothorax. The cardiomediastinal silhouette is limits. No acute fractures are seen. IMPRESSION: No active disease. Electronically Signed   By: Ronney Asters M.D.   On: 01/26/2021 23:08   DG Chest 2 View  Result Date: 01/26/2021 CLINICAL DATA:  Shortness of breath. EXAM:  CHEST - 2 VIEW COMPARISON:  PA Lat 01/18/2021, chest CT 12/27/2020 FINDINGS: The cardiac size is normal. There is aortic tortuosity. Left upper lobe paramediastinal mass approaching 3 cm is not well seen radiographically. There is increased opacity in the lateral left lower lobe base concerning for a new area of pneumonia. Rest of the lungs clear with COPD change. No pleural effusion is seen. Slight thoracic kyphodextroscoliosis. IMPRESSION: Increased opacity lateral left lower lobe base concerning for pneumonia. Left upper lobe mass noted on CT is not well seen radiographically. Electronically Signed   By: Telford Nab M.D.   On: 01/26/2021 05:21    Procedures Procedures    Medications Ordered in ED Medications  clindamycin (CLEOCIN) IVPB 600 mg (has no administration in time range)    ED Course/ Medical Decision Making/ A&P  This patient presents to the ED for concern of shortness of breath and leg swelling, this involves an extensive number of treatment options, and is a complaint that carries with it a high risk of complications and morbidity.  The differential diagnosis includes DVT, cellulitis, pulmonary embolism, acute coronary syndrome   Co morbidities that complicate the patient evaluation  Adenocarcinoma of the lung being treated with chemotherapy   Additional history obtained:  No additional history from outside facilities is available   Lab Tests:  I Ordered, and personally interpreted labs.  The pertinent results include: Pancytopenia on CBC, however consistent with baseline.  He has slightly  elevated BNP   Imaging Studies ordered:  I ordered imaging studies including chest x-ray I independently visualized and interpreted imaging which showed no acute process I agree with the radiologist interpretation   Cardiac Monitoring:  The patient was maintained on a cardiac monitor.  I personally viewed and interpreted the cardiac monitored which showed an underlying rhythm of: Atrial fibrillation which is his baseline underlying rhythm   Medicines ordered and prescription drug management:  I ordered medication including clindamycin for cellulitis Reevaluation of the patient after these medicines showed that the patient stayed the same I have reviewed the patients home medicines and have made adjustments as needed   Test Considered:  DVT study considered, however patient has had 2 negative DVT studies in the past 2 weeks with similar complaints.  His leg swelling was felt to be related to chemotherapy   Critical Interventions:  Intravenous antibiotics   Consultations Obtained:  No additional consultations indicated or obtained   Problem List / ED Course:  Patient presenting here with complaints of swelling to the left lower leg.  He also describes feeling short of breath, but believes this is related to the nasal packing placed 1 week ago.  He is going to see ENT tomorrow for removal.  He has been off of his Eliquis for the past week, but I doubt DVT.  Patient to follow-up with ENT tomorrow, then restart his Eliquis once the packing has been removed. He will be treated with intravenous clindamycin for apparent cellulitis of the left lower leg.  He will be discharged with oral clinda.   Reevaluation:  After the interventions noted above, I reevaluated the patient and found that they have :stayed the same   Social Determinants of Health:  N/A   Dispostion:  After consideration of the diagnostic results and the patients response to treatment, I feel that the  patent would benefit from oral antibiotics and follow-up as outpatient.  He is to have his packing removed tomorrow by ENT..    Final Clinical Impression(s) /  ED Diagnoses Final diagnoses:  None    Rx / DC Orders ED Discharge Orders     None         Veryl Speak, MD 01/27/21 [phone]

## 2021-01-26 NOTE — ED Notes (Addendum)
Patient approached nurses station with complaints of shortness of breath. See orders

## 2021-01-26 NOTE — ED Notes (Signed)
Pt states that he is feeling better and would like to go home and take a nap. Pt understood that leaving would be AMA. Pt advised to return for any reason .

## 2021-01-27 ENCOUNTER — Telehealth: Payer: Self-pay | Admitting: Medical Oncology

## 2021-01-27 ENCOUNTER — Other Ambulatory Visit: Payer: Self-pay

## 2021-01-27 DIAGNOSIS — Z79899 Other long term (current) drug therapy: Secondary | ICD-10-CM | POA: Insufficient documentation

## 2021-01-27 DIAGNOSIS — T171XXA Foreign body in nostril, initial encounter: Secondary | ICD-10-CM | POA: Diagnosis not present

## 2021-01-27 DIAGNOSIS — R2681 Unsteadiness on feet: Secondary | ICD-10-CM

## 2021-01-27 DIAGNOSIS — Z4800 Encounter for change or removal of nonsurgical wound dressing: Secondary | ICD-10-CM | POA: Insufficient documentation

## 2021-01-27 DIAGNOSIS — X58XXXA Exposure to other specified factors, initial encounter: Secondary | ICD-10-CM | POA: Insufficient documentation

## 2021-01-27 DIAGNOSIS — R0602 Shortness of breath: Secondary | ICD-10-CM | POA: Diagnosis not present

## 2021-01-27 DIAGNOSIS — Z5321 Procedure and treatment not carried out due to patient leaving prior to being seen by health care provider: Secondary | ICD-10-CM | POA: Insufficient documentation

## 2021-01-27 DIAGNOSIS — G4700 Insomnia, unspecified: Secondary | ICD-10-CM | POA: Diagnosis not present

## 2021-01-27 LAB — BRAIN NATRIURETIC PEPTIDE: B Natriuretic Peptide: 155.8 pg/mL — ABNORMAL HIGH (ref 0.0–100.0)

## 2021-01-27 MED ORDER — CLINDAMYCIN HCL 300 MG PO CAPS: 300.0000 mg | ORAL_CAPSULE | Freq: Four times a day (QID) | ORAL | 0 refills | Status: AC

## 2021-01-27 NOTE — Telephone Encounter (Signed)
ED visit for SOB ,neutropenic, nose bleeds. I told son for pt to keep appt Thursday.

## 2021-01-27 NOTE — Discharge Instructions (Signed)
Begin taking clindamycin as prescribed.  Follow-up with your ENT tomorrow for packing removal.  Ask the ENT whether or not you should restart your Eliquis at tomorrow's appointment.  Return to the emergency department for worsening breathing, high fevers, or other new and concerning symptoms.

## 2021-01-28 ENCOUNTER — Emergency Department (HOSPITAL_COMMUNITY)
Admission: EM | Admit: 2021-01-28 | Discharge: 2021-01-28 | Disposition: A | Discharge status: Home / Self Care | Payer: Medicare Other | Attending: Emergency Medicine | Admitting: Emergency Medicine

## 2021-01-28 ENCOUNTER — Emergency Department (HOSPITAL_COMMUNITY)
Admission: EM | Admit: 2021-01-28 | Discharge: 2021-01-28 | Disposition: A | Discharge status: Home / Self Care | Payer: Medicare Other | Source: Home / Self Care | Attending: Emergency Medicine | Admitting: Emergency Medicine

## 2021-01-28 ENCOUNTER — Other Ambulatory Visit: Payer: Self-pay

## 2021-01-28 ENCOUNTER — Encounter (HOSPITAL_COMMUNITY): Payer: Self-pay

## 2021-01-28 ENCOUNTER — Encounter (HOSPITAL_COMMUNITY): Payer: Self-pay | Admitting: Emergency Medicine

## 2021-01-28 DIAGNOSIS — Z79899 Other long term (current) drug therapy: Secondary | ICD-10-CM | POA: Insufficient documentation

## 2021-01-28 DIAGNOSIS — X58XXXA Exposure to other specified factors, initial encounter: Secondary | ICD-10-CM | POA: Insufficient documentation

## 2021-01-28 DIAGNOSIS — T171XXA Foreign body in nostril, initial encounter: Secondary | ICD-10-CM | POA: Diagnosis not present

## 2021-01-28 DIAGNOSIS — Z4800 Encounter for change or removal of nonsurgical wound dressing: Secondary | ICD-10-CM | POA: Insufficient documentation

## 2021-01-28 NOTE — ED Notes (Signed)
Pt called x2 in ED lobby. Pt could not be located within ED lobby, and did not respond to name call.

## 2021-01-28 NOTE — ED Triage Notes (Signed)
Pt would like the packing removed from his L nostril states it has been in for over a week.

## 2021-01-28 NOTE — ED Triage Notes (Signed)
Pt states that he can't breathe out of his nose because the packing is still in place. Pt reports that the packing is to be removed by the ENT tomorrow.

## 2021-01-28 NOTE — ED Notes (Signed)
Went to discharge patient and patient was dabbing nose, two drops of blood on tissue, MD Nanavati aware, will watch and ensure pt is not bleeding before discharge.

## 2021-01-28 NOTE — ED Provider Notes (Signed)
[place] DEPT Provider Note   CSN: [id number] Arrival date & time: 01/28/21  1222     History  No chief complaint on file.   Joe Hill is a 81 y.o. male.  HPI     81 year old male comes with a chief complaint of foreign body removal.  On 1230 he required packing of his left nare for epistaxis.  He had called ENT clinic, but they cannot get him in until 10 January.  He is having difficulty in breathing because of the packing given his other respiratory issues. Bleeding has stopped.   Home Medications Prior to Admission medications   Medication Sig Start Date End Date Taking? Authorizing Provider  albuterol (VENTOLIN HFA) 108 (90 Base) MCG/ACT inhaler TAKE 2 PUFFS BY MOUTH EVERY 6 HOURS AS NEEDED FOR WHEEZE OR SHORTNESS OF BREATH Patient taking differently: 2 puffs every 6 (six) hours as needed for wheezing or shortness of breath. 01/07/21   [name], MD  clindamycin (CLEOCIN) 300 MG capsule Take 1 capsule (300 mg total) by mouth 4 (four) times daily. X 7 days 01/27/21   Veryl Speak, MD  dextromethorphan-guaiFENesin [facility] DM) 30-600 MG 12hr tablet Take 1 tablet by mouth 2 (two) times daily as needed for cough.    [provider]  diltiazem (CARDIZEM CD) 240 MG 24 hr capsule Take 1 capsule (240 mg total) by mouth daily. 01/25/21   [name], DO  feeding supplement (BOOST HIGH PROTEIN) LIQD Take 1 Container by mouth 2 (two) times daily as needed (nutrition).    [provider]  folic acid (FOLVITE) 1 MG tablet Take 1 tablet (1 mg total) by mouth daily. 11/18/20   Curt Bears, MD  ibuprofen (ADVIL) 400 MG tablet Take 400 mg by mouth every 6 (six) hours as needed for headache.    [provider]  pantoprazole (PROTONIX) 40 MG tablet Take 1 tablet (40 mg total) by mouth daily. Patient taking differently: Take 40 mg by mouth daily as needed (for acid reflux). 01/02/21 04/02/21  [place] Donnamarie Poag, DO   prochlorperazine (COMPAZINE) 10 MG tablet Take 1 tablet (10 mg total) by mouth every 6 (six) hours as needed for nausea or vomiting. 11/18/20   Curt Bears, MD  triamcinolone cream (KENALOG) 0.5 % Apply topically 4 (four) times daily as needed (irritation to skin lower extremity). 01/25/21   [name], DO      Allergies    Penicillins    Review of Systems   Review of Systems  Physical Exam Updated Vital Signs BP 107/62 (BP Location: Left Arm)    Pulse 91    Temp 98 F (36.7 C) (Oral)    Resp 18    SpO2 100%  Physical Exam HENT:     Nose:     Comments: Packing in left nare   ED Results / Procedures / Treatments   Labs (all labs ordered are listed, but only abnormal results are displayed) Labs Reviewed - No data to display  EKG None  Radiology DG Chest 1 View  Result Date: 01/26/2021 CLINICAL DATA:  Shortness of breath. EXAM: CHEST  1 VIEW COMPARISON:  Chest x-ray 01/16/2021. FINDINGS: There is stable mild elevation of the left hemidiaphragm. Linear left basilar atelectasis is present is decreased from prior. There is no new focal lung infiltrate. Costophrenic angles are clear. There is no pneumothorax. The cardiomediastinal silhouette is limits. No acute fractures are seen. IMPRESSION: No active disease. Electronically Signed  By: Ronney Asters M.D.   On: 01/26/2021 23:08    Procedures .Foreign Body Removal  Date/Time: 01/28/2021 2:30 PM Performed by: Varney Biles, MD Authorized by: Varney Biles, MD  Consent: Verbal consent obtained. Risks and benefits: risks, benefits and alternatives were discussed Consent given by: patient Patient understanding: patient states understanding of the procedure being performed Imaging studies: imaging studies available Required items: required blood products, implants, devices, and special equipment available Patient identity confirmed: arm band Time out: Immediately prior to procedure a "time out" was called to verify the  correct patient, procedure, equipment, support staff and site/side marked as required. Body area: nose Location details: left nostril  Sedation: Patient sedated: no  Patient cooperative: yes Localization method: visualized Complexity: simple 1 objects recovered. Objects recovered: Merocel packing Post-procedure assessment: foreign body removed Patient tolerance: patient tolerated the procedure well with no immediate complications Comments: Couple of drops of blood followed the removal of the foreign body. Will keep the patient in the ED for now.     Medications Ordered in ED Medications - No data to display  ED Course/ Medical Decision Making/ A&P                           Medical Decision Making Amount and/or Complexity of Data Reviewed Independent Historian: parent External Data Reviewed: notes.  Risk OTC drugs. Minor surgery with identified risk factors.   81 year old male comes in with chief complaint of nasal packing removal.  He has history of epistaxis with complexity of thrombocytopenia and anticoagulation.  Patient required admission to the hospital because of his anemia and recurrent epistaxis.  It appears that patient had his packing completed on 12-30.  He comes in for removal of the packing today because he was unable to establish follow-up appointment for this week.  He does not want the packing to stay in because of the inconvenience it is causing.  There is no active bleeding.  Patient understands that removal of the packing can potentially lead to epistaxis again, which will require repacking.  Foreign body was removed without any complication.  Patient to be monitored for now.        Final Clinical Impression(s) / ED Diagnoses Final diagnoses:  Encounter for removal of nasal packing    Rx / DC Orders ED Discharge Orders     None         Varney Biles, MD 01/28/21 1434

## 2021-01-28 NOTE — ED Notes (Signed)
No more nasal bleeding at this time. Pt had blown nose and bled a little bit, advised against this.

## 2021-01-28 NOTE — ED Notes (Signed)
Pt called x1 in ED lobby. Pt could not be located within ED lobby, and did not respond to name call.

## 2021-01-28 NOTE — ED Notes (Signed)
Pt called x3 in ED lobby. Currently not present and did not respond to name call at noted times, triage RN notified. Pt could not be located within ED lobby.

## 2021-01-29 ENCOUNTER — Telehealth: Payer: Self-pay

## 2021-01-29 ENCOUNTER — Inpatient Hospital Stay: Payer: Medicare Other | Attending: Internal Medicine

## 2021-01-29 ENCOUNTER — Inpatient Hospital Stay (HOSPITAL_BASED_OUTPATIENT_CLINIC_OR_DEPARTMENT_OTHER): Payer: Medicare Other | Admitting: Internal Medicine

## 2021-01-29 ENCOUNTER — Other Ambulatory Visit: Payer: Self-pay | Admitting: *Deleted

## 2021-01-29 ENCOUNTER — Inpatient Hospital Stay: Payer: Medicare Other

## 2021-01-29 VITALS — BP 100/57 | HR 114 | Temp 98.2°F | Resp 18 | Ht 70.0 in | Wt 169.6 lb

## 2021-01-29 VITALS — HR 87

## 2021-01-29 DIAGNOSIS — D6481 Anemia due to antineoplastic chemotherapy: Secondary | ICD-10-CM | POA: Diagnosis not present

## 2021-01-29 DIAGNOSIS — C3492 Malignant neoplasm of unspecified part of left bronchus or lung: Secondary | ICD-10-CM

## 2021-01-29 DIAGNOSIS — Z79899 Other long term (current) drug therapy: Secondary | ICD-10-CM | POA: Diagnosis not present

## 2021-01-29 DIAGNOSIS — Z5111 Encounter for antineoplastic chemotherapy: Secondary | ICD-10-CM

## 2021-01-29 DIAGNOSIS — D61818 Other pancytopenia: Secondary | ICD-10-CM

## 2021-01-29 DIAGNOSIS — C3490 Malignant neoplasm of unspecified part of unspecified bronchus or lung: Secondary | ICD-10-CM

## 2021-01-29 DIAGNOSIS — T451X5A Adverse effect of antineoplastic and immunosuppressive drugs, initial encounter: Secondary | ICD-10-CM | POA: Diagnosis not present

## 2021-01-29 DIAGNOSIS — Z5112 Encounter for antineoplastic immunotherapy: Secondary | ICD-10-CM | POA: Insufficient documentation

## 2021-01-29 DIAGNOSIS — R5382 Chronic fatigue, unspecified: Secondary | ICD-10-CM

## 2021-01-29 DIAGNOSIS — C3412 Malignant neoplasm of upper lobe, left bronchus or lung: Secondary | ICD-10-CM | POA: Diagnosis not present

## 2021-01-29 DIAGNOSIS — C7972 Secondary malignant neoplasm of left adrenal gland: Secondary | ICD-10-CM | POA: Diagnosis not present

## 2021-01-29 LAB — CMP (CANCER CENTER ONLY)
ALT: 21 U/L (ref 0–44)
AST: 14 U/L — ABNORMAL LOW (ref 15–41)
Albumin: 3.3 g/dL — ABNORMAL LOW (ref 3.5–5.0)
Alkaline Phosphatase: 103 U/L (ref 38–126)
Anion gap: 5 (ref 5–15)
BUN: 19 mg/dL (ref 8–23)
CO2: 29 mmol/L (ref 22–32)
Calcium: 8.9 mg/dL (ref 8.9–10.3)
Chloride: 106 mmol/L (ref 98–111)
Creatinine: 1.09 mg/dL (ref 0.61–1.24)
GFR, Estimated: >60 mL/min (ref >60)
Glucose, Bld: 176 mg/dL — ABNORMAL HIGH (ref 70–99)
Potassium: 4.2 mmol/L (ref 3.5–5.1)
Sodium: 140 mmol/L (ref 135–145)
Total Bilirubin: 1 mg/dL (ref 0.3–1.2)
Total Protein: 6 g/dL — ABNORMAL LOW (ref 6.5–8.1)

## 2021-01-29 LAB — CBC WITH DIFFERENTIAL (CANCER CENTER ONLY)
Abs Immature Granulocytes: 0.01 10*3/uL (ref 0.00–0.07)
Basophils Absolute: 0 10*3/uL (ref 0.0–0.1)
Basophils Relative: 0 %
Eosinophils Absolute: 0 10*3/uL (ref 0.0–0.5)
Eosinophils Relative: 0 %
HCT: 22.5 % — ABNORMAL LOW (ref 39.0–52.0)
Hemoglobin: 7.5 g/dL — ABNORMAL LOW (ref 13.0–17.0)
Immature Granulocytes: 0 %
Lymphocytes Relative: 15 %
Lymphs Abs: 0.4 10*3/uL — ABNORMAL LOW (ref 0.7–4.0)
MCH: 34.7 pg — ABNORMAL HIGH (ref 26.0–34.0)
MCHC: 33.3 g/dL (ref 30.0–36.0)
MCV: 104.2 fL — ABNORMAL HIGH (ref 80.0–100.0)
Monocytes Absolute: 0.4 10*3/uL (ref 0.1–1.0)
Monocytes Relative: 15 %
Neutro Abs: 2.1 10*3/uL (ref 1.7–7.7)
Neutrophils Relative %: 70 %
Platelet Count: 105 10*3/uL — ABNORMAL LOW (ref 150–400)
RBC: 2.16 MIL/uL — ABNORMAL LOW (ref 4.22–5.81)
RDW: 25.2 % — ABNORMAL HIGH (ref 11.5–15.5)
WBC Count: 3 10*3/uL — ABNORMAL LOW (ref 4.0–10.5)
nRBC: 0.7 % — ABNORMAL HIGH (ref 0.0–0.2)

## 2021-01-29 LAB — PREPARE RBC (CROSSMATCH)

## 2021-01-29 LAB — SAMPLE TO BLOOD BANK

## 2021-01-29 LAB — TSH: TSH: 0.309 u[IU]/mL — ABNORMAL LOW (ref 0.320–4.118)

## 2021-01-29 MED ORDER — SODIUM CHLORIDE 0.9 % IV SOLN
350.0000 mg | Freq: Once | INTRAVENOUS | Status: AC
  Administered 2021-01-29: 350 mg via INTRAVENOUS
  Filled 2021-01-29: qty 35

## 2021-01-29 MED ORDER — PALONOSETRON HCL INJECTION 0.25 MG/5ML
0.2500 mg | Freq: Once | INTRAVENOUS | Status: AC
  Administered 2021-01-29: 0.25 mg via INTRAVENOUS
  Filled 2021-01-29: qty 5

## 2021-01-29 MED ORDER — SODIUM CHLORIDE 0.9 % IV SOLN: Freq: Once | INTRAVENOUS | Status: AC

## 2021-01-29 MED ORDER — SODIUM CHLORIDE 0.9 % IV SOLN
150.0000 mg | Freq: Once | INTRAVENOUS | Status: AC
  Administered 2021-01-29: 150 mg via INTRAVENOUS
  Filled 2021-01-29: qty 150

## 2021-01-29 MED ORDER — SODIUM CHLORIDE 0.9 % IV SOLN
10.0000 mg | Freq: Once | INTRAVENOUS | Status: AC
  Administered 2021-01-29: 10 mg via INTRAVENOUS
  Filled 2021-01-29: qty 10

## 2021-01-29 MED ORDER — SODIUM CHLORIDE 0.9 % IV SOLN
200.0000 mg | Freq: Once | INTRAVENOUS | Status: AC
  Administered 2021-01-29: 200 mg via INTRAVENOUS
  Filled 2021-01-29: qty 8

## 2021-01-29 MED ORDER — SODIUM CHLORIDE 0.9 % IV SOLN
400.0000 mg/m2 | Freq: Once | INTRAVENOUS | Status: AC
  Administered 2021-01-29: 800 mg via INTRAVENOUS
  Filled 2021-01-29: qty 20

## 2021-01-29 NOTE — Telephone Encounter (Signed)
CRITICAL VALUE STICKER  CRITICAL VALUE: Hgb = 7.5  RECEIVER (on-site recipient of call): Yetta Glassman, [place] NOTIFIED: 01/29/21 at 10:12am  MESSENGER (representative from lab): Pam  MD NOTIFIED: [facility]  TIME OF NOTIFICATION: 01/29/21 at 10:15am   RESPONSE: Notification provided to Dr. Julien Nordmann who advise to transfuse pt with 2 units of PRBC's. This is being arranged at this time.

## 2021-01-29 NOTE — Progress Notes (Signed)
[place] [phone]   [phone]  OFFICE PROGRESS NOTE  [name], [place] [address]  DIAGNOSIS: stage IV (T2 a, N3, M1 B) non-small cell lung cancer, adenocarcinoma presented with large central left upper lobe lung mass in addition to bilateral mediastinal lymphadenopathy and right upper lobe metastatic nodule as well as left adrenal metastasis diagnosed in October 2022.  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  Molecular studies showed no actionable mutations.  PDL1 Expression 5%  PRIOR THERAPY: None  CURRENT THERAPY: Systemic chemotherapy with carboplatin for AUC of 5, Alimta 500 Mg/M2 and Keytruda 200 Mg IV every 3 weeks, status post 3 cycles.  Starting from cycle #3 his dose of carboplatin will be reduced to AUC of 4 and Alimta 400 Mg/M2 secondary to intolerance.  INTERVAL HISTORY: Joe Hill 81 y.o. male returns to the clinic today for follow-up visit.  The patient is feeling fine today with no concerning complaints except for fatigue.  He denied having any current chest pain but has shortness of breath with exertion with no cough or hemoptysis.  He has no nausea, vomiting, diarrhea or constipation.  He has no headache or visual changes.  He continues to tolerate his systemic chemotherapy fairly well except for the fatigue secondary to chemotherapy-induced anemia.  He is here today for evaluation before starting cycle #4.   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  MEDICATIONS:  Current Outpatient Medications  Medication Sig Dispense Refill   albuterol (VENTOLIN HFA) 108 (90 Base) MCG/ACT inhaler TAKE 2 PUFFS BY MOUTH EVERY 6 HOURS AS NEEDED FOR WHEEZE OR SHORTNESS OF BREATH (Patient taking differently: 2 puffs every 6 (six) hours as needed for wheezing or shortness of breath.) 6.7 each 2   clindamycin (CLEOCIN) 300 MG capsule Take 1 capsule (300 mg total) by mouth 4 (four) times daily. X 7 days 28 capsule 0   dextromethorphan-guaiFENesin (MUCINEX DM) 30-600 MG 12hr tablet Take 1 tablet by mouth 2 (two) times daily as needed for cough.     diltiazem (CARDIZEM CD) 240 MG 24 hr capsule Take 1 capsule (240 mg total) by mouth daily. 30 capsule 1   feeding supplement (BOOST HIGH PROTEIN) LIQD Take 1 Container by mouth 2 (two) times daily as needed (nutrition).     folic acid (FOLVITE) 1 MG tablet Take 1 tablet (1 mg total) by mouth daily. 30 tablet 4   ibuprofen (ADVIL) 400 MG tablet Take 400 mg by mouth every 6 (six) hours as needed for headache.     pantoprazole (PROTONIX) 40 MG tablet Take 1 tablet (40 mg total) by mouth daily. (Patient taking differently: Take 40 mg by mouth daily as needed (for acid reflux).) 30 tablet 2   prochlorperazine (COMPAZINE) 10 MG tablet Take 1 tablet (10 mg total) by mouth every 6 (six) hours as needed for nausea or vomiting. 30 tablet 0   triamcinolone cream (KENALOG) 0.5 % Apply topically 4 (four) times  daily as needed (irritation to skin lower extremity). 30 g 0   No current facility-administered medications for this visit.    SURGICAL HISTORY:  Past Surgical History:  Procedure Laterality Date   APPENDECTOMY     CATARACT EXTRACTION     PROSTATE SURGERY     prostatectomy   VIDEO BRONCHOSCOPY WITH ENDOBRONCHIAL NAVIGATION N/A 10/30/2020   Procedure: VIDEO BRONCHOSCOPY WITH ENDOBRONCHIAL NAVIGATION;  Surgeon: Melrose Nakayama, MD;  Location: [facility];  Service: Thoracic;   Laterality: N/A;   VIDEO BRONCHOSCOPY WITH ENDOBRONCHIAL ULTRASOUND N/A 10/30/2020   Procedure: VIDEO BRONCHOSCOPY WITH ENDOBRONCHIAL ULTRASOUND;  Surgeon: Melrose Nakayama, MD;  Location: [facility];  Service: Thoracic;  Laterality: N/A;    REVIEW OF SYSTEMS:  A comprehensive review of systems was negative except for: Constitutional: positive for fatigue Respiratory: positive for cough and dyspnea on exertion   PHYSICAL EXAMINATION: General appearance: alert, cooperative, fatigued, and no distress Head: Normocephalic, without obvious abnormality, atraumatic Neck: no adenopathy, no JVD, supple, symmetrical, trachea midline, and thyroid not enlarged, symmetric, no tenderness/mass/nodules Lymph nodes: Cervical, supraclavicular, and axillary nodes normal. Resp: clear to auscultation bilaterally Back: symmetric, no curvature. ROM normal. No CVA tenderness. Cardio: regular rate and rhythm, S1, S2 normal, no murmur, click, rub or gallop GI: soft, non-tender; bowel sounds normal; no masses,  no organomegaly Extremities: extremities normal, atraumatic, no cyanosis or edema  ECOG PERFORMANCE STATUS: 1 - Symptomatic but completely ambulatory  Blood pressure (!) 100/57, pulse (!) 114, temperature 98.2 F (36.8 C), temperature source Tympanic, resp. rate 18, height 5' 10" (1.778 m), weight 169 lb 9.6 oz (76.9 kg), SpO2 100 %.  LABORATORY DATA: Lab Results  Component Value Date   WBC 3.0 (L) 01/29/2021   HGB 7.5 (L) 01/29/2021   HCT 22.5 (L) 01/29/2021   MCV 104.2 (H) 01/29/2021   PLT 105 (L) 01/29/2021      Chemistry      Component Value Date/Time   NA 140 01/29/2021 0951   NA 137 11/04/2020 0840   K 4.2 01/29/2021 0951   CL 106 01/29/2021 0951   CO2 29 01/29/2021 0951   BUN 19 01/29/2021 0951   BUN 15 11/04/2020 0840   CREATININE 1.09 01/29/2021 0951   CREATININE 0.97 10/15/2019 0907      Component Value Date/Time   CALCIUM 8.9 01/29/2021 0951   ALKPHOS 103 01/29/2021 0951   AST  14 (L) 01/29/2021 0951   ALT 21 01/29/2021 0951   BILITOT 1.0 01/29/2021 0951       RADIOGRAPHIC STUDIES: DG Chest 1 View  Result Date: 01/26/2021 CLINICAL DATA:  Shortness of breath. EXAM: CHEST  1 VIEW COMPARISON:  Chest x-ray 01/16/2021. FINDINGS: There is stable mild elevation of the left hemidiaphragm. Linear left basilar atelectasis is present is decreased from prior. There is no new focal lung infiltrate. Costophrenic angles are clear. There is no pneumothorax. The cardiomediastinal silhouette is limits. No acute fractures are seen. IMPRESSION: No active disease. Electronically Signed   By: Ronney Asters M.D.   On: 01/26/2021 23:08   DG Chest 2 View  Result Date: 01/26/2021 CLINICAL DATA:  Shortness of breath. EXAM: CHEST - 2 VIEW COMPARISON:  PA Lat 01/18/2021, chest CT 12/27/2020 FINDINGS: The cardiac size is normal. There is aortic tortuosity. Left upper lobe paramediastinal mass approaching 3 cm is not well seen radiographically. There is increased opacity in the lateral left lower lobe base concerning for a new area of pneumonia. Rest of the lungs clear with COPD  change. No pleural effusion is seen. Slight thoracic kyphodextroscoliosis. IMPRESSION: Increased opacity lateral left lower lobe base concerning for pneumonia. Left upper lobe mass noted on CT is not well seen radiographically. Electronically Signed   By: Telford Nab M.D.   On: 01/26/2021 05:21   DG Chest 2 View  Result Date: 01/18/2021 CLINICAL DATA:  Shortness of breath. Currently receiving treatment for lung cancer. EXAM: CHEST - 2 VIEW COMPARISON:  01/16/2021. FINDINGS: The heart size and mediastinal contours are stable. There is a lobular density along the medial aspect of the right heart border, compatible with known pulmonary nodule. Emphysematous changes are present in the lungs bilaterally. There is chronic elevation of the left diaphragm with atelectasis or infiltrate at the left lung base. No effusion or.  Degenerative changes are present in the thoracic spine. IMPRESSION: Stable chest with no acute cardiopulmonary process. Electronically Signed   By: Brett Fairy M.D.   On: 01/18/2021 04:51   DG Chest 2 View  Result Date: 01/16/2021 CLINICAL DATA:  Shortness of breath, history of lung carcinoma EXAM: CHEST - 2 VIEW COMPARISON:  01/11/2021, CT from 12/27/2020 FINDINGS: Cardiac shadow is within normal limits. Patchy airspace opacity is again noted in the left base stable from the prior exam and prior CT examination. The known left upper lobe nodule is identified adjacent to the aortic knob. No new infiltrate or sizable effusion is seen. No acute bony abnormality is noted. IMPRESSION: Stable changes in the chest similar to that noted on prior CT. No acute abnormality is noted. Electronically Signed   By: Inez Catalina M.D.   On: 01/16/2021 00:50   DG Chest Portable 1 View  Result Date: 01/11/2021 CLINICAL DATA:  Hypoxemia. EXAM: PORTABLE CHEST 1 VIEW COMPARISON:  Chest x-rays since December 24, 2020. CT scan of the chest December 26, 2020. FINDINGS: Continued infiltrate is seen in the left base, not significantly changed. No pneumothorax. The cardiomediastinal silhouette is stable. The lungs are unchanged. IMPRESSION: Continued infiltrate in the left base, not significantly changed since December 24, 2020. Pneumonia and aspiration are leading considerations. Recommend clinical correlation and follow-up to complete resolution. Electronically Signed   By: Dorise Bullion III M.D.   On: 01/11/2021 10:20   VAS [place] LOWER EXTREMITY VENOUS (DVT) (ONLY [facility] & [facility])  Result Date: 01/19/2021  Lower Venous DVT Study Patient Name:  DAIRON PROCTER  Date of Exam:   01/19/2021 Medical Rec #: [id number]       Accession #:    [id number] Date of Birth: [birth year]       Patient Gender: M Patient Age:   68 years Exam Location:  [facility] Procedure:      VAS [place] LOWER EXTREMITY VENOUS (DVT) Referring Phys: Benjamine Mola REES  --------------------------------------------------------------------------------  Indications: Edema.  Limitations: Poor ultrasound/tissue interface and body habitus. Comparison Study: 01/01/2021- negative lower extremity venous duplex Performing Technologist: Maudry Mayhew MHA, RDMS, RVT, RDCS  Examination Guidelines: A complete evaluation includes B-mode imaging, spectral Doppler, color Doppler, and power Doppler as needed of all accessible portions of each vessel. Bilateral testing is considered an integral part of a complete examination. Limited examinations for reoccurring indications may be performed as noted. The reflux portion of the exam is performed with the patient in reverse Trendelenburg.  +-----+---------------+---------+-----------+----------+--------------+  RIGHT Compressibility Phasicity Spontaneity Properties Thrombus Aging  +-----+---------------+---------+-----------+----------+--------------+  CFV   Full            Yes       Yes                                    +-----+---------------+---------+-----------+----------+--------------+   +---------+---------------+---------+-----------+----------+--------------+  LEFT      Compressibility Phasicity Spontaneity Properties Thrombus Aging  +---------+---------------+---------+-----------+----------+--------------+  CFV       Full            Yes       Yes                                    +---------+---------------+---------+-----------+----------+--------------+  SFJ       Full                                                             +---------+---------------+---------+-----------+----------+--------------+  FV Prox   Full                                                             +---------+---------------+---------+-----------+----------+--------------+  FV Mid    Full                                                             +---------+---------------+---------+-----------+----------+--------------+  FV Distal Full                                                              +---------+---------------+---------+-----------+----------+--------------+  PFV       Full                                                             +---------+---------------+---------+-----------+----------+--------------+  POP       Full            Yes       Yes                                    +---------+---------------+---------+-----------+----------+--------------+  PTV       Full                                                             +---------+---------------+---------+-----------+----------+--------------+  PERO      Full                                                             +---------+---------------+---------+-----------+----------+--------------+  Summary: RIGHT: - No evidence of common femoral vein obstruction.  LEFT: - There is no evidence of deep vein thrombosis in the lower extremity.  - No cystic structure found in the popliteal fossa.  *See table(s) above for measurements and observations. Electronically signed by Jamelle Haring on 01/19/2021 at 1:05:26 PM.    Final    VAS [place] LOWER EXTREMITY VENOUS (DVT)  Result Date: 01/01/2021  Lower Venous DVT Study Patient Name:  JUDAS MOHAMMAD  Date of Exam:   01/01/2021 Medical Rec #: [id number]       Accession #:    [id number] Date of Birth: 12-20-1940       Patient Gender: M Patient Age:   23 years Exam Location:  [facility] Procedure:      VAS [place] LOWER EXTREMITY VENOUS (DVT) Referring Phys: ERIC [place] --------------------------------------------------------------------------------  Indications: Edema.  Risk Factors: Cancer. Comparison Study: No prior studies. Performing Technologist: Oliver Hum RVT  Examination Guidelines: A complete evaluation includes B-mode imaging, spectral Doppler, color Doppler, and power Doppler as needed of all accessible portions of each vessel. Bilateral testing is considered an integral part of a complete examination. Limited examinations for reoccurring  indications may be performed as noted. The reflux portion of the exam is performed with the patient in reverse Trendelenburg.  +---------+---------------+---------+-----------+----------+--------------+  RIGHT     Compressibility Phasicity Spontaneity Properties Thrombus Aging  +---------+---------------+---------+-----------+----------+--------------+  CFV       Full            Yes       Yes                                    +---------+---------------+---------+-----------+----------+--------------+  SFJ       Full                                                             +---------+---------------+---------+-----------+----------+--------------+  FV Prox   Full                                                             +---------+---------------+---------+-----------+----------+--------------+  FV Mid    Full                                                             +---------+---------------+---------+-----------+----------+--------------+  FV Distal Full                                                             +---------+---------------+---------+-----------+----------+--------------+  PFV       Full                                                             +---------+---------------+---------+-----------+----------+--------------+  POP       Full            Yes       Yes                                    +---------+---------------+---------+-----------+----------+--------------+  PTV       Full                                                             +---------+---------------+---------+-----------+----------+--------------+  PERO      Full                                                             +---------+---------------+---------+-----------+----------+--------------+   +---------+---------------+---------+-----------+----------+--------------+  LEFT      Compressibility Phasicity Spontaneity Properties Thrombus Aging   +---------+---------------+---------+-----------+----------+--------------+  CFV       Full            Yes       Yes                                    +---------+---------------+---------+-----------+----------+--------------+  SFJ       Full                                                             +---------+---------------+---------+-----------+----------+--------------+  FV Prox   Full                                                             +---------+---------------+---------+-----------+----------+--------------+  FV Mid    Full                                                             +---------+---------------+---------+-----------+----------+--------------+  FV Distal Full                                                             +---------+---------------+---------+-----------+----------+--------------+  PFV       Full                                                             +---------+---------------+---------+-----------+----------+--------------+  POP       Full            Yes       Yes                                    +---------+---------------+---------+-----------+----------+--------------+  PTV       Full                                                             +---------+---------------+---------+-----------+----------+--------------+  PERO      Full                                                             +---------+---------------+---------+-----------+----------+--------------+     Summary: RIGHT: - There is no evidence of deep vein thrombosis in the lower extremity.  - No cystic structure found in the popliteal fossa.  LEFT: - There is no evidence of deep vein thrombosis in the lower extremity.  - No cystic structure found in the popliteal fossa.  *See table(s) above for measurements and observations. Electronically signed by Orlie Pollen on 01/01/2021 at 5:17:49 PM.    Final     ASSESSMENT AND PLAN: This is a very pleasant 81 years old white male diagnosed with a stage  IV (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 The patient started systemic chemotherapy with carboplatin for AUC of 5, Alimta 500 Mg/M2 and Keytruda 200 Mg IV every 3 weeks status post 3 cycles. Starting from cycle #3 The patient is currently on carboplatin for AUC of 4 and Alimta 400 Mg/M2.  He tolerated the last cycle of his treatment well except for the fatigue secondary to chemotherapy-induced anemia. I recommended for the patient to proceed with cycle #4 today as planned. For the chemotherapy-induced anemia, will arrange for the patient to receive 2 units of PRBCs transfusion this week. The patient will come back for follow-up visit in 3 weeks for evaluation with repeat CT scan of the chest, abdomen and pelvis for restaging of her disease. The patient was advised to call immediately if he has any other concerning symptoms in the interval. The patient voices understanding of current disease status and treatment options and is in agreement with the current care plan.  All questions were answered. The patient knows to call the clinic with any problems, questions or concerns. We can certainly see the patient much sooner if necessary.   Disclaimer: This note was dictated with voice recognition software. Similar sounding words can inadvertently be transcribed and may not be corrected upon review.

## 2021-01-29 NOTE — Progress Notes (Signed)
Per Dr. Julien Nordmann, pt is okay to treat today with Hgb 7.5.

## 2021-01-30 ENCOUNTER — Ambulatory Visit (INDEPENDENT_AMBULATORY_CARE_PROVIDER_SITE_OTHER): Payer: Medicare Other | Admitting: Internal Medicine

## 2021-01-30 ENCOUNTER — Encounter: Payer: Self-pay | Admitting: Internal Medicine

## 2021-01-30 ENCOUNTER — Inpatient Hospital Stay: Payer: Medicare Other

## 2021-01-30 ENCOUNTER — Other Ambulatory Visit: Payer: Self-pay

## 2021-01-30 DIAGNOSIS — D6481 Anemia due to antineoplastic chemotherapy: Secondary | ICD-10-CM | POA: Diagnosis not present

## 2021-01-30 DIAGNOSIS — C7972 Secondary malignant neoplasm of left adrenal gland: Secondary | ICD-10-CM | POA: Diagnosis not present

## 2021-01-30 DIAGNOSIS — Z5112 Encounter for antineoplastic immunotherapy: Secondary | ICD-10-CM | POA: Diagnosis not present

## 2021-01-30 DIAGNOSIS — C3412 Malignant neoplasm of upper lobe, left bronchus or lung: Secondary | ICD-10-CM | POA: Diagnosis not present

## 2021-01-30 DIAGNOSIS — I4819 Other persistent atrial fibrillation: Secondary | ICD-10-CM | POA: Diagnosis not present

## 2021-01-30 DIAGNOSIS — C3490 Malignant neoplasm of unspecified part of unspecified bronchus or lung: Secondary | ICD-10-CM

## 2021-01-30 DIAGNOSIS — Z5111 Encounter for antineoplastic chemotherapy: Secondary | ICD-10-CM | POA: Diagnosis not present

## 2021-01-30 DIAGNOSIS — T451X5A Adverse effect of antineoplastic and immunosuppressive drugs, initial encounter: Secondary | ICD-10-CM | POA: Diagnosis not present

## 2021-01-30 MED ORDER — ACETAMINOPHEN 325 MG PO TABS
650.0000 mg | ORAL_TABLET | Freq: Once | ORAL | Status: AC
  Administered 2021-01-30: 650 mg via ORAL
  Filled 2021-01-30: qty 2

## 2021-01-30 MED ORDER — DIPHENHYDRAMINE HCL 25 MG PO CAPS
25.0000 mg | ORAL_CAPSULE | Freq: Once | ORAL | Status: AC
  Administered 2021-01-30: 25 mg via ORAL
  Filled 2021-01-30: qty 1

## 2021-01-30 MED ORDER — SODIUM CHLORIDE 0.9% IV SOLUTION
250.0000 mL | Freq: Once | INTRAVENOUS | Status: AC
  Administered 2021-01-30: 250 mL via INTRAVENOUS

## 2021-01-30 NOTE — Patient Instructions (Signed)
Medication Instructions:  Your physician recommends that you continue on your current medications as directed. Please refer to the Current Medication list given to you today.  Labwork: None ordered.  Testing/Procedures: None ordered.  Follow-Up:  Your physician wants you to follow-up in: 6 months with Oda Kilts, PA.   You will receive a reminder letter in the mail two months in advance. If you don't receive a letter, please call our office to schedule the follow-up appointment.  Your physician wants you to follow-up in: one year with Cristopher Peru, MD  You will receive a reminder letter in the mail two months in advance. If you don't receive a letter, please call our office to schedule the follow-up appointment.   Any Other Special Instructions Will Be Listed Below (If Applicable).  If you need a refill on your cardiac medications before your next appointment, please call your pharmacy.

## 2021-01-30 NOTE — Patient Instructions (Signed)
Blood Transfusion, Adult, Care After This sheet gives you information about how to care for yourself after your procedure. Your doctor may also give you more specific instructions. If you have problems or questions, contact your doctor. What can I expect after the procedure? After the procedure, it is common to have: Bruising and soreness at the IV site. A headache. Follow these instructions at home: Insertion site care   Follow instructions from your doctor about how to take care of your insertion site. This is where an IV tube was put into your vein. Make sure you: Wash your hands with soap and water before and after you change your bandage (dressing). If you cannot use soap and water, use hand sanitizer. Change your bandage as told by your doctor. Check your insertion site every day for signs of infection. Check for: Redness, swelling, or pain. Bleeding from the site. Warmth. Pus or a bad smell. General instructions Take over-the-counter and prescription medicines only as told by your doctor. Rest as told by your doctor. Go back to your normal activities as told by your doctor. Keep all follow-up visits as told by your doctor. This is important. Contact a doctor if: You have itching or red, swollen areas of skin (hives). You feel worried or nervous (anxious). You feel weak after doing your normal activities. You have redness, swelling, warmth, or pain around the insertion site. You have blood coming from the insertion site, and the blood does not stop with pressure. You have pus or a bad smell coming from the insertion site. Get help right away if: You have signs of a serious reaction. This may be coming from an allergy or the body's defense system (immune system). Signs include: Trouble breathing or shortness of breath. Swelling of the face or feeling warm (flushed). Fever or chills. Head, chest, or back pain. Dark pee (urine) or blood in the pee. Widespread rash. Fast  heartbeat. Feeling dizzy or light-headed. You may receive your blood transfusion in an outpatient setting. If so, you will be told whom to contact to report any reactions. These symptoms may be an emergency. Do not wait to see if the symptoms will go away. Get medical help right away. Call your local emergency services (911 in the [place] Do not drive yourself to the hospital. Summary Bruising and soreness at the IV site are common. Check your insertion site every day for signs of infection. Rest as told by your doctor. Go back to your normal activities as told by your doctor. Get help right away if you have signs of a serious reaction. This information is not intended to replace advice given to you by your health care provider. Make sure you discuss any questions you have with your health care provider. Document Revised: 05/08/2020 Document Reviewed: 07/06/2018 Elsevier Patient Education  2022 [employer]  

## 2021-01-30 NOTE — Progress Notes (Signed)
HPI Joe Hill returns today for followup. He is a pleasant 81 yo man with a h/o persistent atrial fib and non-small cell lung CA, who returns today for followup. He has been stable. He has not had any bleeding on his [place]. He has class 2 dyspnea which is multifactorial.  Allergies  Allergen Reactions   Penicillins Hives    Did it involve swelling of the face/tongue/throat, SOB, or low BP? No Did it involve sudden or severe rash/hives, skin peeling, or any reaction on the inside of your mouth or nose? Yes Did you need to seek medical attention at a hospital or doctor's office? No When did it last happen?     childhood  If all above answers are "NO", may proceed with cephalosporin use.       Current Outpatient Medications  Medication Sig Dispense Refill   albuterol (VENTOLIN HFA) 108 (90 Base) MCG/ACT inhaler TAKE 2 PUFFS BY MOUTH EVERY 6 HOURS AS NEEDED FOR WHEEZE OR SHORTNESS OF BREATH (Patient taking differently: 2 puffs every 6 (six) hours as needed for wheezing or shortness of breath.) 6.7 each 2   clindamycin (CLEOCIN) 300 MG capsule Take 1 capsule (300 mg total) by mouth 4 (four) times daily. X 7 days 28 capsule 0   dextromethorphan-guaiFENesin (MUCINEX DM) 30-600 MG 12hr tablet Take 1 tablet by mouth 2 (two) times daily as needed for cough.     diltiazem (CARDIZEM CD) 240 MG 24 hr capsule Take 1 capsule (240 mg total) by mouth daily. 30 capsule 1   feeding supplement (BOOST HIGH PROTEIN) LIQD Take 1 Container by mouth 2 (two) times daily as needed (nutrition).     folic acid (FOLVITE) 1 MG tablet Take 1 tablet (1 mg total) by mouth daily. 30 tablet 4   ibuprofen (ADVIL) 400 MG tablet Take 400 mg by mouth every 6 (six) hours as needed for headache.     pantoprazole (PROTONIX) 40 MG tablet Take 1 tablet (40 mg total) by mouth daily. (Patient taking differently: Take 40 mg by mouth daily as needed (for acid reflux).) 30 tablet 2   prochlorperazine (COMPAZINE) 10 MG tablet Take 1  tablet (10 mg total) by mouth every 6 (six) hours as needed for nausea or vomiting. 30 tablet 0   triamcinolone cream (KENALOG) 0.5 % Apply topically 4 (four) times daily as needed (irritation to skin lower extremity). 30 g 0   No current facility-administered medications for this visit.     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   Past Surgical History:  Procedure Laterality Date   APPENDECTOMY     CATARACT EXTRACTION     PROSTATE SURGERY     prostatectomy   VIDEO BRONCHOSCOPY WITH ENDOBRONCHIAL NAVIGATION N/A 10/30/2020   Procedure: VIDEO BRONCHOSCOPY WITH ENDOBRONCHIAL NAVIGATION;  Surgeon: Melrose Nakayama, MD;  Location: [facility];  Service: Thoracic;  Laterality: N/A;   VIDEO BRONCHOSCOPY WITH ENDOBRONCHIAL ULTRASOUND N/A 10/30/2020   Procedure: VIDEO BRONCHOSCOPY WITH ENDOBRONCHIAL ULTRASOUND;  Surgeon: Melrose Nakayama, MD;  Location: [facility];  Service: Thoracic;  Laterality: N/A;     Family History  Problem Relation Age of Onset   Hyperlipidemia Mother    Cancer Father        lung, smoker   Cancer Sister        breast, smoker   Arthritis Maternal Aunt      Social History   Socioeconomic History   Marital status: Single    Spouse name: Not on file   Number of children: Not on file   Years of education: Not on file   Highest education level: Not on file  Occupational History   Not on file  Tobacco Use   Smoking status: Every Day    Packs/day: 0.25    Types: Cigarettes   Smokeless tobacco: Never  Vaping Use   Vaping Use: Never used  Substance and Sexual  Activity   Alcohol use: No    Alcohol/week: 0.0 standard drinks   Drug use: No   Sexual activity: Not on file  Other Topics Concern   Not on file  Social History Narrative   Divorced. 2 sons. 5 grandkids- 2 in college, 3 in hs in 2021.       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       HObbies: woodworking, taking care of yard, very active at work   Social Determinants of Radio broadcast assistant Strain: Not on file  Food Insecurity: Not on file  Transportation Needs: Not on file  Physical Activity: Not on file  Stress: Not on file  Social Connections: Not on file  Intimate Partner Violence: Not on file     BP (!) 100/58    Pulse 77    Ht 5\' 10"  (1.778 m)    Wt 174 lb 3.2 oz (79 kg)    SpO2 98%    BMI 25.00 kg/m   Physical Exam:  Well appearing NAD HEENT: Unremarkable Neck:  No JVD, no thyromegally Lymphatics:  No adenopathy Back:  No CVA tenderness Lungs:  Clear HEART:  Regular rate rhythm, no murmurs, no rubs, no clicks Abd:  soft, positive bowel sounds, no organomegally, no rebound, no guarding Ext:  2 plus pulses, no edema, no cyanosis, no clubbing Skin:  No rashes no nodules Neuro:  CN II through XII intact, motor grossly intact  Assess/Plan:  Persistent atrial fib - his rates appear to be controlled. He will continue his calcium channel blocker.  Coags - he is not on an [place]ricks due to his presumed bleeding risk. He has a h/o hemoptysis.  Joe Hill Joe Diego,MD

## 2021-01-31 ENCOUNTER — Other Ambulatory Visit: Payer: Self-pay

## 2021-01-31 ENCOUNTER — Emergency Department (HOSPITAL_COMMUNITY)
Admission: EM | Admit: 2021-01-31 | Discharge: 2021-01-31 | Disposition: A | Discharge status: Home / Self Care | Payer: Medicare Other | Attending: Emergency Medicine | Admitting: Emergency Medicine

## 2021-01-31 DIAGNOSIS — S59912A Unspecified injury of left forearm, initial encounter: Secondary | ICD-10-CM | POA: Diagnosis present

## 2021-01-31 DIAGNOSIS — W1839XA Other fall on same level, initial encounter: Secondary | ICD-10-CM | POA: Diagnosis not present

## 2021-01-31 DIAGNOSIS — Y9389 Activity, other specified: Secondary | ICD-10-CM | POA: Diagnosis not present

## 2021-01-31 DIAGNOSIS — S51812A Laceration without foreign body of left forearm, initial encounter: Secondary | ICD-10-CM | POA: Insufficient documentation

## 2021-01-31 NOTE — Discharge Instructions (Signed)
Return to ED with any new or worsening symptoms such as inability to use left hand, signs of infection Follow up with PCP in the next 5-7 days for wound check Utilize supplies I have given you to keep wound clean. Change dressing on wound 1x/day. You may shower as normal, just make sure to re-dress wound afterward.

## 2021-01-31 NOTE — ED Provider Notes (Signed)
[facility] DEPT Provider Note   CSN: [id number] Arrival date & time: 01/31/21  1210     History  Chief Complaint  Patient presents with   Extremity Laceration    Joe Hill is a 81 y.o. male who presents to ED due to skin tear to left forearm. Patient states that his neighbors dog saw him earlier today and ran at him knocking him over. Patient states that he extended his left arm out to brace his fall which resulted in extensive skin tear to left forearm. Patient denies being on blood thinners or hitting his head. Patient states he is up to date on his tetanus vaccination. Patient denies any numbness or tingling to area.  HPI     Home Medications Prior to Admission medications   Medication Sig Start Date End Date Taking? Authorizing Provider  albuterol (VENTOLIN HFA) 108 (90 Base) MCG/ACT inhaler TAKE 2 PUFFS BY MOUTH EVERY 6 HOURS AS NEEDED FOR WHEEZE OR SHORTNESS OF BREATH Patient taking differently: 2 puffs every 6 (six) hours as needed for wheezing or shortness of breath. 01/07/21   [name], MD  clindamycin (CLEOCIN) 300 MG capsule Take 1 capsule (300 mg total) by mouth 4 (four) times daily. X 7 days 01/27/21   Veryl Speak, MD  dextromethorphan-guaiFENesin [facility] DM) 30-600 MG 12hr tablet Take 1 tablet by mouth 2 (two) times daily as needed for cough.    [provider]  diltiazem (CARDIZEM CD) 240 MG 24 hr capsule Take 1 capsule (240 mg total) by mouth daily. 01/25/21   [name], DO  feeding supplement (BOOST HIGH PROTEIN) LIQD Take 1 Container by mouth 2 (two) times daily as needed (nutrition).    [provider]  folic acid (FOLVITE) 1 MG tablet Take 1 tablet (1 mg total) by mouth daily. 11/18/20   Curt Bears, MD  ibuprofen (ADVIL) 400 MG tablet Take 400 mg by mouth every 6 (six) hours as needed for headache.    [provider]  pantoprazole (PROTONIX) 40 MG tablet Take 1 tablet (40 mg total)  by mouth daily. Patient taking differently: Take 40 mg by mouth daily as needed (for acid reflux). 01/02/21 04/02/21  [place] Donnamarie Poag, DO  prochlorperazine (COMPAZINE) 10 MG tablet Take 1 tablet (10 mg total) by mouth every 6 (six) hours as needed for nausea or vomiting. 11/18/20   Curt Bears, MD  triamcinolone cream (KENALOG) 0.5 % Apply topically 4 (four) times daily as needed (irritation to skin lower extremity). 01/25/21   [name], DO      Allergies    Penicillins    Review of Systems   Review of Systems  Skin:  Positive for wound.  Neurological:  Negative for dizziness, syncope and headaches.  All other systems reviewed and are negative.  Physical Exam Updated Vital Signs BP 105/67 (BP Location: Left Arm)    Pulse 99    Temp 97.7 F (36.5 C) (Oral)    Resp 16    SpO2 99%  Physical Exam Vitals and nursing note reviewed.  Constitutional:      General: He is not in acute distress.    Appearance: He is not ill-appearing or toxic-appearing.  HENT:     Head: Normocephalic and atraumatic.  Eyes:     Extraocular Movements: Extraocular movements intact.     Pupils: Pupils are equal, round, and reactive to light.  Cardiovascular:     Rate and Rhythm: Normal rate and regular rhythm.  Pulmonary:     Effort: Pulmonary effort is normal.     Breath sounds: Normal breath sounds. No wheezing.  Abdominal:     General: Abdomen is flat.     Palpations: Abdomen is soft.     Tenderness: There is no abdominal tenderness.  Skin:    General: Skin is warm and dry.     Capillary Refill: Capillary refill takes less than 2 seconds.     Findings: Laceration present.     Comments: 6 inch skin tear to dorsal aspect of left forearm. Bleeding controlled. Laceration not deep enough for sutures or steristrips. Patient laceration is appropriate for bandaging.   Neurological:     General: No focal deficit present.     Mental Status: He is alert.    ED Results / Procedures / Treatments    Labs (all labs ordered are listed, but only abnormal results are displayed) Labs Reviewed - No data to display  EKG None  Radiology No results found.  Procedures Procedures    Medications Ordered in ED Medications - No data to display  ED Course/ Medical Decision Making/ A&P                           Medical Decision Making  80YOM presents to ED due to skin tear to left forearm. Patient skin tear has bleeding controlled. Patient denies being on blood thinners or hitting his head. There is no tendon involvement. Patient still has full use of left arm/hand. Patient has full strength and sensation intact to affected appendage.   Patient laceration not deep enough to justify sutures. This skin tear is appropriate for bandaging and wound care. I have provided return precautions to patient which he voices understanding with. I have provided supplies to patient to care for his wound. I have instructed the patient on signs and symptoms of infection to look out for, which he voices understanding with. I have instructed the patient to follow up with his PCP in the next 5-7 days for wound check and to ensure that the wound does not appear infected. The patient voices understanding with all instructions. This patient was also seen by Dr. Doren Custard who voiced agreement with plan. Patient stable on discharge.  Final Clinical Impression(s) / ED Diagnoses Final diagnoses:  Laceration of left forearm, initial encounter    Rx / DC Orders ED Discharge Orders     None         Lawana Chambers 01/31/21 1349    Godfrey Pick, MD 02/04/21 0139

## 2021-01-31 NOTE — ED Triage Notes (Signed)
Patient reports he fell this morning trying to play with dog. Patient has skin tear to left arm. Patient denies pain.

## 2021-02-02 LAB — BPAM RBC
Blood Product Expiration Date: 202302022359
Blood Product Expiration Date: 202302022359
ISSUE DATE / TIME: 202301060956
ISSUE DATE / TIME: 202301060956
Unit Type and Rh: 5100
Unit Type and Rh: 5100

## 2021-02-02 LAB — TYPE AND SCREEN
ABO/RH(D): O POS
Antibody Screen: NEGATIVE
Unit division: 0
Unit division: 0

## 2021-02-04 ENCOUNTER — Inpatient Hospital Stay: Payer: Medicare Other

## 2021-02-05 ENCOUNTER — Inpatient Hospital Stay: Payer: Medicare Other

## 2021-02-05 ENCOUNTER — Other Ambulatory Visit: Payer: Self-pay

## 2021-02-05 DIAGNOSIS — T451X5A Adverse effect of antineoplastic and immunosuppressive drugs, initial encounter: Secondary | ICD-10-CM | POA: Diagnosis not present

## 2021-02-05 DIAGNOSIS — Z5112 Encounter for antineoplastic immunotherapy: Secondary | ICD-10-CM | POA: Diagnosis not present

## 2021-02-05 DIAGNOSIS — Z79899 Other long term (current) drug therapy: Secondary | ICD-10-CM | POA: Diagnosis not present

## 2021-02-05 DIAGNOSIS — Z5111 Encounter for antineoplastic chemotherapy: Secondary | ICD-10-CM | POA: Diagnosis not present

## 2021-02-05 DIAGNOSIS — C3412 Malignant neoplasm of upper lobe, left bronchus or lung: Secondary | ICD-10-CM

## 2021-02-05 DIAGNOSIS — D6481 Anemia due to antineoplastic chemotherapy: Secondary | ICD-10-CM | POA: Diagnosis not present

## 2021-02-05 DIAGNOSIS — C7972 Secondary malignant neoplasm of left adrenal gland: Secondary | ICD-10-CM | POA: Diagnosis not present

## 2021-02-05 LAB — CMP (CANCER CENTER ONLY)
ALT: 27 U/L (ref 0–44)
AST: 19 U/L (ref 15–41)
Albumin: 3.5 g/dL (ref 3.5–5.0)
Alkaline Phosphatase: 105 U/L (ref 38–126)
Anion gap: 6 (ref 5–15)
BUN: 33 mg/dL — ABNORMAL HIGH (ref 8–23)
CO2: 30 mmol/L (ref 22–32)
Calcium: 9.2 mg/dL (ref 8.9–10.3)
Chloride: 104 mmol/L (ref 98–111)
Creatinine: 1.13 mg/dL (ref 0.61–1.24)
GFR, Estimated: >60 mL/min (ref >60)
Glucose, Bld: 115 mg/dL — ABNORMAL HIGH (ref 70–99)
Potassium: 3.7 mmol/L (ref 3.5–5.1)
Sodium: 140 mmol/L (ref 135–145)
Total Bilirubin: 1 mg/dL (ref 0.3–1.2)
Total Protein: 6.3 g/dL — ABNORMAL LOW (ref 6.5–8.1)

## 2021-02-05 LAB — CBC WITH DIFFERENTIAL (CANCER CENTER ONLY)
Abs Immature Granulocytes: 0.01 10*3/uL (ref 0.00–0.07)
Basophils Absolute: 0 10*3/uL (ref 0.0–0.1)
Basophils Relative: 1 %
Eosinophils Absolute: 0 10*3/uL (ref 0.0–0.5)
Eosinophils Relative: 0 %
HCT: 24.8 % — ABNORMAL LOW (ref 39.0–52.0)
Hemoglobin: 8.8 g/dL — ABNORMAL LOW (ref 13.0–17.0)
Immature Granulocytes: 1 %
Lymphocytes Relative: 25 %
Lymphs Abs: 0.4 10*3/uL — ABNORMAL LOW (ref 0.7–4.0)
MCH: 34.1 pg — ABNORMAL HIGH (ref 26.0–34.0)
MCHC: 35.5 g/dL (ref 30.0–36.0)
MCV: 96.1 fL (ref 80.0–100.0)
Monocytes Absolute: 0.1 10*3/uL (ref 0.1–1.0)
Monocytes Relative: 5 %
Neutro Abs: 1 10*3/uL — ABNORMAL LOW (ref 1.7–7.7)
Neutrophils Relative %: 68 %
Platelet Count: 96 10*3/uL — ABNORMAL LOW (ref 150–400)
RBC: 2.58 MIL/uL — ABNORMAL LOW (ref 4.22–5.81)
RDW: 22.3 % — ABNORMAL HIGH (ref 11.5–15.5)
WBC Count: 1.5 10*3/uL — ABNORMAL LOW (ref 4.0–10.5)
nRBC: 0 % (ref 0.0–0.2)

## 2021-02-10 ENCOUNTER — Encounter (HOSPITAL_COMMUNITY): Payer: Self-pay | Admitting: Emergency Medicine

## 2021-02-10 ENCOUNTER — Emergency Department (HOSPITAL_COMMUNITY): Payer: Medicare Other

## 2021-02-10 ENCOUNTER — Emergency Department (HOSPITAL_BASED_OUTPATIENT_CLINIC_OR_DEPARTMENT_OTHER)
Admission: EM | Admit: 2021-02-10 | Discharge: 2021-02-10 | Disposition: A | Discharge status: Home / Self Care | Payer: Medicare Other | Source: Home / Self Care | Attending: Emergency Medicine | Admitting: Emergency Medicine

## 2021-02-10 ENCOUNTER — Telehealth: Payer: Self-pay

## 2021-02-10 ENCOUNTER — Inpatient Hospital Stay (HOSPITAL_COMMUNITY)
Admission: EM | Admit: 2021-02-10 | Discharge: 2021-02-25 | DRG: 871 | Disposition: E | Payer: Medicare Other | Attending: Family Medicine | Admitting: Family Medicine

## 2021-02-10 ENCOUNTER — Other Ambulatory Visit: Payer: Self-pay

## 2021-02-10 DIAGNOSIS — Z87828 Personal history of other (healed) physical injury and trauma: Secondary | ICD-10-CM | POA: Diagnosis not present

## 2021-02-10 DIAGNOSIS — J3800 Paralysis of vocal cords and larynx, unspecified: Secondary | ICD-10-CM | POA: Diagnosis present

## 2021-02-10 DIAGNOSIS — D702 Other drug-induced agranulocytosis: Secondary | ICD-10-CM | POA: Diagnosis not present

## 2021-02-10 DIAGNOSIS — M47812 Spondylosis without myelopathy or radiculopathy, cervical region: Secondary | ICD-10-CM | POA: Diagnosis not present

## 2021-02-10 DIAGNOSIS — R55 Syncope and collapse: Secondary | ICD-10-CM | POA: Diagnosis not present

## 2021-02-10 DIAGNOSIS — F17210 Nicotine dependence, cigarettes, uncomplicated: Secondary | ICD-10-CM | POA: Diagnosis present

## 2021-02-10 DIAGNOSIS — L97521 Non-pressure chronic ulcer of other part of left foot limited to breakdown of skin: Secondary | ICD-10-CM | POA: Diagnosis present

## 2021-02-10 DIAGNOSIS — W06XXXA Fall from bed, initial encounter: Secondary | ICD-10-CM | POA: Diagnosis not present

## 2021-02-10 DIAGNOSIS — R42 Dizziness and giddiness: Secondary | ICD-10-CM | POA: Diagnosis not present

## 2021-02-10 DIAGNOSIS — R609 Edema, unspecified: Secondary | ICD-10-CM | POA: Diagnosis not present

## 2021-02-10 DIAGNOSIS — Z743 Need for continuous supervision: Secondary | ICD-10-CM | POA: Diagnosis not present

## 2021-02-10 DIAGNOSIS — Z86711 Personal history of pulmonary embolism: Secondary | ICD-10-CM

## 2021-02-10 DIAGNOSIS — N179 Acute kidney failure, unspecified: Secondary | ICD-10-CM | POA: Diagnosis present

## 2021-02-10 DIAGNOSIS — D61810 Antineoplastic chemotherapy induced pancytopenia: Secondary | ICD-10-CM | POA: Diagnosis present

## 2021-02-10 DIAGNOSIS — I5022 Chronic systolic (congestive) heart failure: Secondary | ICD-10-CM | POA: Diagnosis present

## 2021-02-10 DIAGNOSIS — R531 Weakness: Secondary | ICD-10-CM | POA: Diagnosis not present

## 2021-02-10 DIAGNOSIS — I4811 Longstanding persistent atrial fibrillation: Secondary | ICD-10-CM | POA: Diagnosis present

## 2021-02-10 DIAGNOSIS — I10 Essential (primary) hypertension: Secondary | ICD-10-CM | POA: Diagnosis present

## 2021-02-10 DIAGNOSIS — R579 Shock, unspecified: Secondary | ICD-10-CM | POA: Diagnosis not present

## 2021-02-10 DIAGNOSIS — J189 Pneumonia, unspecified organism: Secondary | ICD-10-CM | POA: Diagnosis not present

## 2021-02-10 DIAGNOSIS — Z801 Family history of malignant neoplasm of trachea, bronchus and lung: Secondary | ICD-10-CM | POA: Diagnosis not present

## 2021-02-10 DIAGNOSIS — Y92238 Other place in hospital as the place of occurrence of the external cause: Secondary | ICD-10-CM | POA: Diagnosis not present

## 2021-02-10 DIAGNOSIS — E872 Acidosis, unspecified: Secondary | ICD-10-CM | POA: Diagnosis present

## 2021-02-10 DIAGNOSIS — G319 Degenerative disease of nervous system, unspecified: Secondary | ICD-10-CM | POA: Diagnosis not present

## 2021-02-10 DIAGNOSIS — D709 Neutropenia, unspecified: Secondary | ICD-10-CM | POA: Diagnosis present

## 2021-02-10 DIAGNOSIS — T451X5A Adverse effect of antineoplastic and immunosuppressive drugs, initial encounter: Secondary | ICD-10-CM | POA: Diagnosis present

## 2021-02-10 DIAGNOSIS — C3412 Malignant neoplasm of upper lobe, left bronchus or lung: Secondary | ICD-10-CM | POA: Diagnosis present

## 2021-02-10 DIAGNOSIS — Z515 Encounter for palliative care: Secondary | ICD-10-CM

## 2021-02-10 DIAGNOSIS — J9601 Acute respiratory failure with hypoxia: Secondary | ICD-10-CM | POA: Diagnosis present

## 2021-02-10 DIAGNOSIS — D61818 Other pancytopenia: Secondary | ICD-10-CM | POA: Diagnosis present

## 2021-02-10 DIAGNOSIS — Z8546 Personal history of malignant neoplasm of prostate: Secondary | ICD-10-CM

## 2021-02-10 DIAGNOSIS — Z79899 Other long term (current) drug therapy: Secondary | ICD-10-CM

## 2021-02-10 DIAGNOSIS — D696 Thrombocytopenia, unspecified: Secondary | ICD-10-CM | POA: Diagnosis not present

## 2021-02-10 DIAGNOSIS — Y92009 Unspecified place in unspecified non-institutional (private) residence as the place of occurrence of the external cause: Secondary | ICD-10-CM

## 2021-02-10 DIAGNOSIS — J439 Emphysema, unspecified: Secondary | ICD-10-CM | POA: Diagnosis present

## 2021-02-10 DIAGNOSIS — I4819 Other persistent atrial fibrillation: Secondary | ICD-10-CM | POA: Diagnosis present

## 2021-02-10 DIAGNOSIS — Z86010 Personal history of colonic polyps: Secondary | ICD-10-CM

## 2021-02-10 DIAGNOSIS — A419 Sepsis, unspecified organism: Secondary | ICD-10-CM | POA: Diagnosis present

## 2021-02-10 DIAGNOSIS — C3492 Malignant neoplasm of unspecified part of left bronchus or lung: Secondary | ICD-10-CM | POA: Diagnosis present

## 2021-02-10 DIAGNOSIS — Z20822 Contact with and (suspected) exposure to covid-19: Secondary | ICD-10-CM | POA: Diagnosis present

## 2021-02-10 DIAGNOSIS — W1839XA Other fall on same level, initial encounter: Secondary | ICD-10-CM | POA: Diagnosis present

## 2021-02-10 DIAGNOSIS — Z8701 Personal history of pneumonia (recurrent): Secondary | ICD-10-CM

## 2021-02-10 DIAGNOSIS — K219 Gastro-esophageal reflux disease without esophagitis: Secondary | ICD-10-CM | POA: Diagnosis present

## 2021-02-10 DIAGNOSIS — D649 Anemia, unspecified: Secondary | ICD-10-CM

## 2021-02-10 DIAGNOSIS — R000 Tachycardia, unspecified: Secondary | ICD-10-CM | POA: Diagnosis not present

## 2021-02-10 DIAGNOSIS — I110 Hypertensive heart disease with heart failure: Secondary | ICD-10-CM | POA: Diagnosis present

## 2021-02-10 DIAGNOSIS — M545 Low back pain, unspecified: Secondary | ICD-10-CM | POA: Diagnosis not present

## 2021-02-10 DIAGNOSIS — R5081 Fever presenting with conditions classified elsewhere: Secondary | ICD-10-CM | POA: Diagnosis present

## 2021-02-10 DIAGNOSIS — Z66 Do not resuscitate: Secondary | ICD-10-CM | POA: Diagnosis present

## 2021-02-10 DIAGNOSIS — K8020 Calculus of gallbladder without cholecystitis without obstruction: Secondary | ICD-10-CM | POA: Diagnosis not present

## 2021-02-10 DIAGNOSIS — D72819 Decreased white blood cell count, unspecified: Secondary | ICD-10-CM | POA: Insufficient documentation

## 2021-02-10 DIAGNOSIS — R079 Chest pain, unspecified: Secondary | ICD-10-CM | POA: Diagnosis not present

## 2021-02-10 DIAGNOSIS — S0990XA Unspecified injury of head, initial encounter: Secondary | ICD-10-CM | POA: Diagnosis not present

## 2021-02-10 DIAGNOSIS — Z880 Allergy status to penicillin: Secondary | ICD-10-CM

## 2021-02-10 LAB — CBC WITH DIFFERENTIAL/PLATELET
Abs Immature Granulocytes: 0.04 10*3/uL (ref 0.00–0.07)
Basophils Absolute: 0 10*3/uL (ref 0.0–0.1)
Basophils Relative: 0 %
Eosinophils Absolute: 0 10*3/uL (ref 0.0–0.5)
Eosinophils Relative: 0 %
HCT: 17.5 % — ABNORMAL LOW (ref 39.0–52.0)
Hemoglobin: 5.9 g/dL — CL (ref 13.0–17.0)
Immature Granulocytes: 6 %
Lymphocytes Relative: 40 %
Lymphs Abs: 0.3 10*3/uL — ABNORMAL LOW (ref 0.7–4.0)
MCH: 33.5 pg (ref 26.0–34.0)
MCHC: 33.7 g/dL (ref 30.0–36.0)
MCV: 99.4 fL (ref 80.0–100.0)
Monocytes Absolute: 0.1 10*3/uL (ref 0.1–1.0)
Monocytes Relative: 12 %
Neutro Abs: 0.3 10*3/uL — CL (ref 1.7–7.7)
Neutrophils Relative %: 42 %
Platelets: 12 10*3/uL — CL (ref 150–400)
RBC: 1.76 MIL/uL — ABNORMAL LOW (ref 4.22–5.81)
RDW: 21.3 % — ABNORMAL HIGH (ref 11.5–15.5)
WBC: 0.7 10*3/uL — CL (ref 4.0–10.5)
nRBC: 0 % (ref 0.0–0.2)

## 2021-02-10 LAB — TROPONIN I (HIGH SENSITIVITY)
Troponin I (High Sensitivity): 10 ng/L (ref <18)
Troponin I (High Sensitivity): 10 ng/L (ref <18)

## 2021-02-10 LAB — COMPREHENSIVE METABOLIC PANEL
ALT: 30 U/L (ref 0–44)
AST: 25 U/L (ref 15–41)
Albumin: 3.1 g/dL — ABNORMAL LOW (ref 3.5–5.0)
Alkaline Phosphatase: 92 U/L (ref 38–126)
Anion gap: 6 (ref 5–15)
BUN: 35 mg/dL — ABNORMAL HIGH (ref 8–23)
CO2: 28 mmol/L (ref 22–32)
Calcium: 8.6 mg/dL — ABNORMAL LOW (ref 8.9–10.3)
Chloride: 104 mmol/L (ref 98–111)
Creatinine, Ser: 1.14 mg/dL (ref 0.61–1.24)
GFR, Estimated: >60 mL/min (ref >60)
Glucose, Bld: 133 mg/dL — ABNORMAL HIGH (ref 70–99)
Potassium: 3.7 mmol/L (ref 3.5–5.1)
Sodium: 138 mmol/L (ref 135–145)
Total Bilirubin: 1.2 mg/dL (ref 0.3–1.2)
Total Protein: 6.2 g/dL — ABNORMAL LOW (ref 6.5–8.1)

## 2021-02-10 LAB — RESP PANEL BY RT-PCR (FLU A&B, COVID) ARPGX2
Influenza A by PCR: NEGATIVE
Influenza B by PCR: NEGATIVE
SARS Coronavirus 2 by RT PCR: NEGATIVE

## 2021-02-10 LAB — PROTIME-INR
INR: 1.1 (ref 0.8–1.2)
Prothrombin Time: 14.4 seconds (ref 11.4–15.2)

## 2021-02-10 LAB — D-DIMER, QUANTITATIVE: D-Dimer, Quant: 1.76 ug/mL-FEU — ABNORMAL HIGH (ref 0.00–0.50)

## 2021-02-10 LAB — PREPARE RBC (CROSSMATCH)

## 2021-02-10 MED ORDER — FOLIC ACID 1 MG PO TABS
1.0000 mg | ORAL_TABLET | Freq: Every day | ORAL | Status: DC
  Administered 2021-02-11 – 2021-02-12 (×2): 1 mg via ORAL
  Filled 2021-02-10 (×2): qty 1

## 2021-02-10 MED ORDER — ONDANSETRON HCL 4 MG PO TABS: 4.0000 mg | ORAL_TABLET | Freq: Four times a day (QID) | ORAL | Status: DC | PRN

## 2021-02-10 MED ORDER — ACETAMINOPHEN 650 MG RE SUPP: 650.0000 mg | Freq: Four times a day (QID) | RECTAL | Status: DC | PRN

## 2021-02-10 MED ORDER — DILTIAZEM HCL-DEXTROSE 125-5 MG/125ML-% IV SOLN (PREMIX)
5.0000 mg/h | INTRAVENOUS | Status: DC
  Administered 2021-02-11: 5 mg/h via INTRAVENOUS
  Administered 2021-02-11: 15 mg/h via INTRAVENOUS
  Filled 2021-02-10: qty 125

## 2021-02-10 MED ORDER — ACETAMINOPHEN 325 MG PO TABS
650.0000 mg | ORAL_TABLET | Freq: Four times a day (QID) | ORAL | Status: DC | PRN
  Administered 2021-02-12 (×2): 650 mg via ORAL
  Filled 2021-02-10 (×3): qty 2

## 2021-02-10 MED ORDER — DILTIAZEM HCL ER COATED BEADS 120 MG PO CP24: 240.0000 mg | ORAL_CAPSULE | Freq: Every day | ORAL | Status: DC

## 2021-02-10 MED ORDER — ONDANSETRON HCL 4 MG/2ML IJ SOLN: 4.0000 mg | Freq: Four times a day (QID) | INTRAMUSCULAR | Status: DC | PRN

## 2021-02-10 MED ORDER — SODIUM CHLORIDE 0.9 % IV BOLUS
1000.0000 mL | Freq: Once | INTRAVENOUS | Status: AC
  Administered 2021-02-10: 1000 mL via INTRAVENOUS

## 2021-02-10 MED ORDER — SODIUM CHLORIDE 0.9% IV SOLUTION: Freq: Once | INTRAVENOUS | Status: AC

## 2021-02-10 MED ORDER — TBO-FILGRASTIM 480 MCG/0.8ML ~~LOC~~ SOSY
480.0000 ug | PREFILLED_SYRINGE | Freq: Every day | SUBCUTANEOUS | Status: DC
  Administered 2021-02-11: 480 ug via SUBCUTANEOUS
  Filled 2021-02-10 (×4): qty 0.8

## 2021-02-10 MED ORDER — SODIUM CHLORIDE 0.9% FLUSH
3.0000 mL | Freq: Two times a day (BID) | INTRAVENOUS | Status: DC
  Administered 2021-02-11 (×2): 3 mL via INTRAVENOUS

## 2021-02-10 MED ORDER — DILTIAZEM LOAD VIA INFUSION
10.0000 mg | Freq: Once | INTRAVENOUS | Status: AC
  Administered 2021-02-11: 10 mg via INTRAVENOUS
  Filled 2021-02-10: qty 10

## 2021-02-10 MED ORDER — SENNOSIDES-DOCUSATE SODIUM 8.6-50 MG PO TABS: 1.0000 | ORAL_TABLET | Freq: Every evening | ORAL | Status: DC | PRN

## 2021-02-10 MED ORDER — BOOST PLUS PO LIQD
1.0000 | Freq: Two times a day (BID) | ORAL | Status: DC
  Administered 2021-02-11 (×2): 237 mL via ORAL
  Filled 2021-02-10 (×5): qty 237

## 2021-02-10 MED ORDER — SODIUM CHLORIDE 0.9 % IV SOLN
10.0000 mL/h | Freq: Once | INTRAVENOUS | Status: AC
  Administered 2021-02-11: 10 mL/h via INTRAVENOUS

## 2021-02-10 MED ORDER — IOHEXOL 300 MG/ML  SOLN
90.0000 mL | Freq: Once | INTRAMUSCULAR | Status: AC | PRN
  Administered 2021-02-10: 90 mL via INTRAVENOUS

## 2021-02-10 NOTE — H&P (Signed)
History and Physical    Patient: Joe Hill DOB: September 27, 1940 DOA: 02/14/2021 DOS: the patient was seen and examined on 01/25/2021 PCP: [name], MD  Patient coming from: Home  Chief Complaint: Passed out twice.  HPI: Joe Hill is a 81 y.o. male with medical history significant of past medical history of stage IV non-small cell lung cancer on chemotherapy and immunotherapy, COPD, chronic systolic CHF, persistent A. 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  Presents with complaints of syncopal event. Patient was at home, at his baseline when he woke up in the morning. While he was trying to walk he suddenly felt weak and fell.  Patient lost complete consciousness. When he woke up he found that he had struck his nose as well as left side of upper lip. After which he rested and felt better. When he tried to walk again passed out again at that time he called EMS for further assistance. At the time of my evaluation denies having any complaints of chest pain or shortness of breath.  No nausea or vomiting.  No fever no chills.  No diarrhea no constipation.  No bleeding anywhere. He has an ulcer on his left leg which he thinks has been present for 3 to 4 weeks. He has been doing dressing changes on his own at home but has not had any dressing at the time of my evaluation. He has chronic leg swelling for which she was given some Lasix with which he reports improvement in swelling but currently does not take any Lasix. He mentions he remains compliant with his medication. He was recently seen by cardiologist.  Recommend to continue current medication.  Review of Systems: As mentioned in the history of present illness. All other systems reviewed and are negative. 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   Pulmonary embolism ([place]) 12/26/2020   TOBACCO USE 01/30/2007   Past Surgical History:  Procedure Laterality Date   APPENDECTOMY     CATARACT EXTRACTION     PROSTATE SURGERY     prostatectomy   VIDEO BRONCHOSCOPY WITH ENDOBRONCHIAL NAVIGATION N/A 10/30/2020   Procedure: VIDEO BRONCHOSCOPY WITH ENDOBRONCHIAL NAVIGATION;  Surgeon: Melrose Nakayama, MD;  Location: [facility];  Service: Thoracic;  Laterality: N/A;   VIDEO BRONCHOSCOPY WITH ENDOBRONCHIAL ULTRASOUND N/A 10/30/2020   Procedure: VIDEO BRONCHOSCOPY WITH ENDOBRONCHIAL ULTRASOUND;  Surgeon: Melrose Nakayama, MD;  Location: [facility];  Service: Thoracic;  Laterality: N/A;   Social History:  reports that he has been smoking cigarettes. He has been smoking an average of .25 packs per day. He has never used smokeless tobacco. He reports that he does not drink alcohol and does not use drugs.  Allergies  Allergen Reactions   Penicillins Hives    Did it involve swelling of the face/tongue/throat, SOB, or low BP? No Did it involve sudden or severe rash/hives, skin peeling, or any reaction on the inside of your mouth or nose? Yes Did you need to seek medical attention at a hospital or doctor's office? No When did it last happen?     childhood  If all above answers are "NO", may proceed with cephalosporin use.  Family History  Problem Relation Age of Onset   Hyperlipidemia Mother    Cancer Father        lung, smoker   Cancer Sister        breast, smoker   Arthritis Maternal Aunt     Prior to Admission medications   Medication Sig Start Date End Date Taking? Authorizing Provider  albuterol (VENTOLIN HFA) 108 (90 Base) MCG/ACT inhaler TAKE 2 PUFFS BY MOUTH EVERY 6 HOURS AS NEEDED FOR WHEEZE OR SHORTNESS OF BREATH Patient taking differently: 2 puffs every 6 (six) hours as  needed for wheezing or shortness of breath. 01/07/21   [name], MD  clindamycin (CLEOCIN) 300 MG capsule Take 1 capsule (300 mg total) by mouth 4 (four) times daily. X 7 days 01/27/21   Veryl Speak, MD  dextromethorphan-guaiFENesin [facility] DM) 30-600 MG 12hr tablet Take 1 tablet by mouth 2 (two) times daily as needed for cough.    [provider]  diltiazem (CARDIZEM CD) 240 MG 24 hr capsule Take 1 capsule (240 mg total) by mouth daily. 01/25/21   [name], DO  feeding supplement (BOOST HIGH PROTEIN) LIQD Take 1 Container by mouth 2 (two) times daily as needed (nutrition).    [provider]  folic acid (FOLVITE) 1 MG tablet Take 1 tablet (1 mg total) by mouth daily. 11/18/20   Curt Bears, MD  ibuprofen (ADVIL) 400 MG tablet Take 400 mg by mouth every 6 (six) hours as needed for headache.    [provider]  pantoprazole (PROTONIX) 40 MG tablet Take 1 tablet (40 mg total) by mouth daily. Patient taking differently: Take 40 mg by mouth daily as needed (for acid reflux). 01/02/21 04/02/21  [place] Donnamarie Poag, DO  prochlorperazine (COMPAZINE) 10 MG tablet Take 1 tablet (10 mg total) by mouth every 6 (six) hours as needed for nausea or vomiting. 11/18/20   Curt Bears, MD  triamcinolone cream (KENALOG) 0.5 % Apply topically 4 (four) times daily as needed (irritation to skin lower extremity). 01/25/21   [name], DO    Physical Exam: Vitals:   02/09/2021 1455 02/16/2021 1500 02/04/2021 1530 01/30/2021 1600  BP:  122/77 113/69 (!) 144/114  Pulse:  80 (!) 105 (!) 124  Resp:  17 19 18   SpO2:  94% 99% 98%  Weight: 79 kg     Height: 5\' 10"  (1.778 m)      Vitals:   02/17/2021 1455 02/24/2021 1500 02/04/2021 1530 01/27/2021 1600  BP:  122/77 113/69 (!) 144/114  Pulse:  80 (!) 105 (!) 124  Resp:  17 19 18   SpO2:  94% 99% 98%  Weight: 79 kg     Height: 5\' 10"  (1.778 m)       General: Appear in mild distress, no Rash; Oral Mucosa Clear, moist. no Abnormal  Neck Mass Or lumps, Conjunctiva normal  Cardiovascular: S1 and S2 Present, no Murmur, Respiratory: good respiratory effort, Bilateral Air entry present and CTA, no Crackles, no wheezes Abdomen: Bowel Sound present, Soft and no tenderness Extremities: Bilateral pedal edema, left leg dorsal ulcer Neurology: alert and oriented to time, place, and person affect appropriate. no new focal deficit Gait not checked due to patient safety concerns   Data Reviewed:  I have Reviewed nursing notes, Vitals, and Lab results after pt's last encounter. Pertinent lab results CBC shows pancytopenia, CMP shows elevated BUN, troponin negative x2, D-dimer 1.76, I ordered labs including CBC and BMP, I independently visualized and interpreted  imaging which showed CT of the chest protocol, no evidence of large PE, awaiting official read., and I reviewed the last note from oncology and cardiology, and discussed pt's care plan and test results with oncology.   Assessment/Plan * Syncope and collapse- (present on admission) Presents with 2 syncopal event. Currently no focal deficit. Suspect syncope in the setting of pancytopenia specifically anemia. No focal deficit at the time of my evaluation.  No murmur. No significant prior history signifying dehydration. Monitor on telemetry.  Pancytopenia ([place])- (present on admission) Chemotherapy-induced pancytopenia. Last session of chemotherapy 01/29/21. CBC shows WBC 0.7, hemoglobin 5.9, platelet count 12. Patient has some evidence of bleeding on left upper lip area after fall. Syncope most likely secondary to severe anemia. Receiving 2 PRBC transfusion.  Transfuse for hemoglobin less than 8. Receiving 1 platelet transfusion.  Transfuse for platelet count less than 20,000 or active bleeding. Receiving Granix until ANC 1.0 or higher. Appreciate oncology assistance.  Persistent atrial fibrillation with RVR ([place])- (present on admission) Heart rate ranging in 140s. Likely  triggered from anemia inducing tachycardia. On Cardizem at home. Will initiate Cardizem infusion with bolus. Switch to progressive care unit. Echocardiogram in December 2022 shows EF of 40 to 45%.  Recently seen cardiologist, okay to be on Cardizem based on plan during that visit.   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 Receiving systemic chemotherapy, carboplatin, Alimta, Keytruda. Last cycle 1/5. Appreciate oncology assistance.   Chronic systolic CHF (congestive heart failure) ([place])- (present on admission) Volume status appears to be stable. Currently does not require any diuretics.  Monitor. EF 40 to 45%. Global wall motion abnormality. On Cardizem for rate control although would benefit from being on beta-blockers. Given that the patient is already following up with cardiology as well as already on Cardizem, I will continue Cardizem infusion for rate control.   Essential hypertension- (present on admission) Blood pressure stable. For now we will monitor.  GERD- (present on admission) Use PPI as needed. For now monitor.  PROSTATE CANCER, HX OF Prior history. For now monitor.  DNR (do not resuscitate)- (present on admission) Discussed with patient.  He does have a living will. He would like to maintain DNR status for now. Patient verbalized understanding of what does DNR means.  Foot ulcer, left, limited to breakdown of skin ([place])- (present on admission) Patient has dry skin, reports ulcer being present for last 3 to 4 weeks. He has been applying some cream and doing dressing on his own but currently does not have any dressing on and socks are sticking to the ulcer. Requested RN to remove the sock after moistening with saline and apply Xeroform gauze. Wound care consulted.    Advance Care Planning:   Code Status: DNR discussed with patient.  Consults: Oncology  Family Communication: None at  bedside.  Severity of Illness: The appropriate patient status for this patient is OBSERVATION. Observation status is judged to be reasonable and necessary in order to provide the required intensity of service to ensure the patient's safety. The patient's presenting symptoms, physical exam findings, and initial radiographic and laboratory data in the context of their medical condition is felt to place them at decreased risk for further clinical deterioration. Furthermore, it is anticipated that the patient will be medically stable for discharge from the hospital within 2 midnights of admission.   Author: Berle Mull, MD 01/30/2021 5:08 PM  For on call review [email or web address].

## 2021-02-10 NOTE — Assessment & Plan Note (Signed)
Blood pressure stable. For now we will monitor.

## 2021-02-10 NOTE — Progress Notes (Signed)
HEMATOLOGY-ONCOLOGY PROGRESS NOTE  ASSESSMENT AND PLAN: 1.  Pancytopenia secondary to recent chemotherapy 2.  Stage IV non-small cell lung cancer, adenocarcinoma 3.  Presyncope  -CBC reviewed and discussed with the patient.  Noted to have pancytopenia secondary to recent chemotherapy.  Recommend 2 units PRBCs for hemoglobin of 5.9.  Recommend 1 unit of platelets for platelet count of 12,000 and what appears to be recent bleeding as noted by dried blood on his lips and in his nares.  We will initiate Granix 480 mcg subcu daily until [place] is 1.0 or higher. -Monitor CBC with differential daily.  Transfuse PRBCs for hemoglobin less than 8 and transfuse platelets for platelet count less than 20,000 or active bleeding. -CT chest reviewed and overall appears to have stable disease.  He will discuss future chemotherapy and dosing of chemotherapy with primary oncologist, Dr. Julien Nordmann, as an outpatient. -Recommend PT/OT evaluation.  Mikey Bussing, DNP, AGPCNP-BC, AOCNP  SUBJECTIVE: Joe Hill is followed by our office for stage IV non-small cell lung cancer, adenocarcinoma.  He is currently receiving systemic chemotherapy with reduced dose carboplatin for an AUC of 4, Alimta 400 mg per metered squared, and Keytruda 200 mg IV every 3 weeks.  Last cycle was given on 01/29/2021.  He presented to the emergency department with near syncope.  He felt suddenly weak and reports a brief loss of consciousness.  He did fall and strike his nose and upper lip.  He was found to have pancytopenia on his lab work.  CT head and cervical spine did not show any bleed or fracture.  CTA chest negative for PE and shows a stable left upper lobe lung mass.  The patient was seen in the emergency department.  He denies fevers and chills.  States that he is not sure if he has been bleeding but noted to have some dried blood on his mouth and nares.  He currently denies headaches, dizziness, chest pain, shortness of breath.  Denies abdominal  pain, nausea, vomiting.  Oncology History  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[place])  11/05/2020 Initial Diagnosis   Primary adenocarcinoma of upper lobe of left lung ([place])   11/18/2020 Cancer Staging   Staging form: Lung, AJCC 8th Edition - Clinical: Stage IVA Laney Pastor, cN3, cM1b) - Signed by Curt Bears, MD on 11/18/2020    11/27/2020 -  Chemotherapy   Patient is on Treatment Plan : LUNG CARBOplatin / Pemetrexed / Pembrolizumab q21d Induction x 4 cycles / Maintenance Pemetrexed + Pembrolizumab        REVIEW OF SYSTEMS:   Review of Systems  Constitutional:  Positive for malaise/fatigue. Negative for chills and fever.  HENT: Negative.    Eyes: Negative.   Respiratory: Negative.    Cardiovascular: Negative.   Gastrointestinal: Negative.   Skin: Negative.   Neurological:        Presyncopal episode earlier today  Endo/Heme/Allergies:  Bruises/bleeds easily.  Psychiatric/Behavioral: Negative.     I have reviewed the past medical history, past surgical history, social history and family history with the patient and they are unchanged from previous note.   PHYSICAL EXAMINATION: ECOG PERFORMANCE STATUS: 1 - Symptomatic but completely ambulatory  Vitals:   02/09/2021 1530 02/04/2021 1600  BP: 113/69 (!) 144/114  Pulse: (!) 105 (!) 124  Resp: 19 18  SpO2: 99% 98%   Filed Weights   02/20/2021 1455  Weight: 79 kg    Intake/Output from previous day: No intake/output data recorded.  Physical Exam Vitals reviewed.  Constitutional:  General: He is not in acute distress. HENT:     Head: Normocephalic.     Nose:     Comments: Dried blood noted in his nares    Mouth/Throat:     Comments: Dried blood noted to mouth Cardiovascular:     Rate and Rhythm: Tachycardia present.  Skin:    Findings: Bruising present.  Neurological:     Mental Status: He is alert and oriented to person, place, and time.    LABORATORY DATA:  I have reviewed the data as listed CMP Latest Ref Rng &  Units 02/06/2021 02/05/2021 01/29/2021  Glucose 70 - 99 mg/dL 133(H) 115(H) 176(H)  BUN 8 - 23 mg/dL 35(H) 33(H) 19  Creatinine 0.61 - 1.24 mg/dL 1.14 1.13 1.09  Sodium 135 - 145 mmol/L 138 140 140  Potassium 3.5 - 5.1 mmol/L 3.7 3.7 4.2  Chloride 98 - 111 mmol/L 104 104 106  CO2 22 - 32 mmol/L 28 30 29   Calcium 8.9 - 10.3 mg/dL 8.6(L) 9.2 8.9  Total Protein 6.5 - 8.1 g/dL 6.2(L) 6.3(L) 6.0(L)  Total Bilirubin 0.3 - 1.2 mg/dL 1.2 1.0 1.0  Alkaline Phos 38 - 126 U/L 92 105 103  AST 15 - 41 U/L 25 19 14(L)  ALT 0 - 44 U/L 30 27 21     Lab Results  Component Value Date   WBC 0.7 (LL) 02/09/2021   HGB 5.9 (LL) 01/29/2021   HCT 17.5 (L) 02/09/2021   MCV 99.4 02/09/2021   PLT 12 (LL) 02/01/2021   NEUTROABS PENDING 01/26/2021    No results found for: CEA1, CEA, CAN199, CA125, PSA1  DG Chest 1 View  Result Date: 01/26/2021 CLINICAL DATA:  Shortness of breath. EXAM: CHEST  1 VIEW COMPARISON:  Chest x-ray 01/16/2021. FINDINGS: There is stable mild elevation of the left hemidiaphragm. Linear left basilar atelectasis is present is decreased from prior. There is no new focal lung infiltrate. Costophrenic angles are clear. There is no pneumothorax. The cardiomediastinal silhouette is limits. No acute fractures are seen. IMPRESSION: No active disease. Electronically Signed   By: Ronney Asters M.D.   On: 01/26/2021 23:08   DG Chest 2 View  Result Date: 01/26/2021 CLINICAL DATA:  Shortness of breath. EXAM: CHEST - 2 VIEW COMPARISON:  PA Lat 01/18/2021, chest CT 12/27/2020 FINDINGS: The cardiac size is normal. There is aortic tortuosity. Left upper lobe paramediastinal mass approaching 3 cm is not well seen radiographically. There is increased opacity in the lateral left lower lobe base concerning for a new area of pneumonia. Rest of the lungs clear with COPD change. No pleural effusion is seen. Slight thoracic kyphodextroscoliosis. IMPRESSION: Increased opacity lateral left lower lobe base concerning for  pneumonia. Left upper lobe mass noted on CT is not well seen radiographically. Electronically Signed   By: Telford Nab M.D.   On: 01/26/2021 05:21   DG Chest 2 View  Result Date: 01/18/2021 CLINICAL DATA:  Shortness of breath. Currently receiving treatment for lung cancer. EXAM: CHEST - 2 VIEW COMPARISON:  01/16/2021. FINDINGS: The heart size and mediastinal contours are stable. There is a lobular density along the medial aspect of the right heart border, compatible with known pulmonary nodule. Emphysematous changes are present in the lungs bilaterally. There is chronic elevation of the left diaphragm with atelectasis or infiltrate at the left lung base. No effusion or. Degenerative changes are present in the thoracic spine. IMPRESSION: Stable chest with no acute cardiopulmonary process. Electronically Signed   By: Brett Fairy  M.D.   On: 01/18/2021 04:51   DG Chest 2 View  Result Date: 01/16/2021 CLINICAL DATA:  Shortness of breath, history of lung carcinoma EXAM: CHEST - 2 VIEW COMPARISON:  01/11/2021, CT from 12/27/2020 FINDINGS: Cardiac shadow is within normal limits. Patchy airspace opacity is again noted in the left base stable from the prior exam and prior CT examination. The known left upper lobe nodule is identified adjacent to the aortic knob. No new infiltrate or sizable effusion is seen. No acute bony abnormality is noted. IMPRESSION: Stable changes in the chest similar to that noted on prior CT. No acute abnormality is noted. Electronically Signed   By: Inez Catalina M.D.   On: 01/16/2021 00:50   CT Head Wo Contrast  Result Date: 02/13/2021 CLINICAL DATA:  Syncopal episodes.  Fell. EXAM: CT HEAD WITHOUT CONTRAST CT CERVICAL SPINE WITHOUT CONTRAST TECHNIQUE: Multidetector CT imaging of the head and cervical spine was performed following the standard protocol without intravenous contrast. Multiplanar CT image reconstructions of the cervical spine were also generated. RADIATION DOSE  REDUCTION: This exam was performed according to the departmental dose-optimization program which includes automated exposure control, adjustment of the mA and/or kV according to patient size and/or use of iterative reconstruction technique. COMPARISON:  12/26/2020 FINDINGS: CT HEAD FINDINGS Brain: Stable age related cerebral atrophy, ventriculomegaly and periventricular white matter disease. No extra-axial fluid collections are identified. No CT findings for acute hemispheric infarction or intracranial hemorrhage. No mass lesions. The brainstem and cerebellum are normal. Vascular: Stable vascular calcifications. No aneurysm or hyperdense vessels. Skull: No skull fracture or bone lesions. Sinuses/Orbits: The paranasal sinuses and mastoid air cells are clear. The globes are intact. Other: No scalp lesions or scalp hematoma. CT CERVICAL SPINE FINDINGS Alignment: Normal Skull base and vertebrae: No acute fracture. No primary bone lesion or focal pathologic process. Soft tissues and spinal canal: No prevertebral fluid or swelling. No visible canal hematoma. Disc levels: Degenerative cervical spondylosis with multilevel disc disease and facet disease bulging discs osteophytic ridging but no significant spinal or foraminal stenosis. Upper chest: No significant findings. Emphysematous changes and pulmonary scarring. Other: No neck mass or adenopathy. IMPRESSION: 1. Stable age related cerebral atrophy, ventriculomegaly and periventricular white matter disease. 2. No acute intracranial findings or skull fracture. 3. Degenerative cervical spondylosis but no acute cervical spine fracture. Electronically Signed   By: Marijo Sanes M.D.   On: 01/25/2021 14:44   CT Angio Chest PE W and/or Wo Contrast  Result Date: 02/22/2021 CLINICAL DATA:  Chest pain.  Elevated D-dimer. EXAM: CT ANGIOGRAPHY CHEST WITH CONTRAST TECHNIQUE: Multidetector CT imaging of the chest was performed using the standard protocol during bolus  administration of intravenous contrast. Multiplanar CT image reconstructions and MIPs were obtained to evaluate the vascular anatomy. RADIATION DOSE REDUCTION: This exam was performed according to the departmental dose-optimization program which includes automated exposure control, adjustment of the mA and/or kV according to patient size and/or use of iterative reconstruction technique. CONTRAST:  41mL OMNIPAQUE IOHEXOL 300 MG/ML  SOLN COMPARISON:  12/27/2020 FINDINGS: Cardiovascular: The heart is normal in size. No pericardial effusion. Stable tortuosity, ectasia and calcification of the thoracic aorta. Stable three-vessel coronary artery calcifications. The pulmonary arterial tree is well opacified. No filling defects to suggest pulmonary embolism. Mediastinum/Nodes: No mediastinal or hilar mass or lymphadenopathy. Small scattered nodes are stable. The esophagus is grossly normal. Lungs/Pleura: Stable underlying emphysematous changes and pulmonary scarring. Stable medial left upper lobe lung mass measuring a maximum of 2.5 cm on  image 47/10. Less pronounced soft tissue invading the mediastinum. Improving left lower lobe infiltrate. No pleural effusions. Upper Abdomen: Stable bilateral adrenal gland metastasis. No definite hepatic lesions. Stable vascular calcifications. Musculoskeletal: No chest wall mass, supraclavicular or axillary adenopathy. The bony thorax is intact. Healing lower sternal fracture. Review of the MIP images confirms the above findings. IMPRESSION: 1. No CT findings for pulmonary embolism. 2. Stable tortuosity, ectasia and calcification of the thoracic aorta. 3. Stable three-vessel coronary artery calcifications. 4. Stable medial left upper lobe lung mass. 5. Improving left lower lobe infiltrate. 6. Stable bilateral adrenal gland metastasis. 7. Stable emphysematous changes and pulmonary scarring. Aortic Atherosclerosis (ICD10-I70.0) and Emphysema (ICD10-J43.9). Electronically Signed   By: Marijo Sanes M.D.   On: 02/03/2021 16:28   CT Cervical Spine Wo Contrast  Result Date: 02/18/2021 CLINICAL DATA:  Syncopal episodes.  Fell. EXAM: CT HEAD WITHOUT CONTRAST CT CERVICAL SPINE WITHOUT CONTRAST TECHNIQUE: Multidetector CT imaging of the head and cervical spine was performed following the standard protocol without intravenous contrast. Multiplanar CT image reconstructions of the cervical spine were also generated. RADIATION DOSE REDUCTION: This exam was performed according to the departmental dose-optimization program which includes automated exposure control, adjustment of the mA and/or kV according to patient size and/or use of iterative reconstruction technique. COMPARISON:  12/26/2020 FINDINGS: CT HEAD FINDINGS Brain: Stable age related cerebral atrophy, ventriculomegaly and periventricular white matter disease. No extra-axial fluid collections are identified. No CT findings for acute hemispheric infarction or intracranial hemorrhage. No mass lesions. The brainstem and cerebellum are normal. Vascular: Stable vascular calcifications. No aneurysm or hyperdense vessels. Skull: No skull fracture or bone lesions. Sinuses/Orbits: The paranasal sinuses and mastoid air cells are clear. The globes are intact. Other: No scalp lesions or scalp hematoma. CT CERVICAL SPINE FINDINGS Alignment: Normal Skull base and vertebrae: No acute fracture. No primary bone lesion or focal pathologic process. Soft tissues and spinal canal: No prevertebral fluid or swelling. No visible canal hematoma. Disc levels: Degenerative cervical spondylosis with multilevel disc disease and facet disease bulging discs osteophytic ridging but no significant spinal or foraminal stenosis. Upper chest: No significant findings. Emphysematous changes and pulmonary scarring. Other: No neck mass or adenopathy. IMPRESSION: 1. Stable age related cerebral atrophy, ventriculomegaly and periventricular white matter disease. 2. No acute intracranial  findings or skull fracture. 3. Degenerative cervical spondylosis but no acute cervical spine fracture. Electronically Signed   By: Marijo Sanes M.D.   On: 02/23/2021 14:44   DG Chest Port 1 View  Result Date: 02/06/2021 CLINICAL DATA:  Palpitation. Near syncope. Fall. History of lung cancer. EXAM: PORTABLE CHEST 1 VIEW COMPARISON:  01/26/2021 FINDINGS: Heart size is normal. No signs of pleural effusion or edema. No superimposed airspace consolidation identified. The visualized osseous structures are unremarkable. IMPRESSION: No acute cardiopulmonary abnormalities. Electronically Signed   By: Kerby Moors M.D.   On: 02/11/2021 13:12   VAS [place] LOWER EXTREMITY VENOUS (DVT) (7a-7p)  Result Date: 01/29/2021  Lower Venous DVT Study Patient Name:  Joe Hill  Date of Exam:   01/27/2021 Medical Rec #: [id number]       Accession #:    [id number] Date of Birth: 23-Aug-1940       Patient Gender: M Patient Age:   81 years Exam Location:  [facility] Procedure:      VAS [place] LOWER EXTREMITY VENOUS (DVT) Referring Phys: Collier Salina MESSICK --------------------------------------------------------------------------------  Indications: Edema.  Comparison Study: 01/01/2021 - RIGHT:                   -  There is no evidence of deep vein thrombosis in the lower                   extremity.                    - No cystic structure found in the popliteal fossa.                    LEFT:                   - There is no evidence of deep vein thrombosis in the lower                   extremity.                    - No cystic structure found in the popliteal fossa.                    01/19/2021 - RIGHT:                   - No evidence of common femoral vein obstruction.                    LEFT:                   - There is no evidence of deep vein thrombosis in the lower                   extremity.                    - No cystic structure found in the popliteal fossa. Performing Technologist: Oliver Hum RVT  Examination  Guidelines: A complete evaluation includes B-mode imaging, spectral Doppler, color Doppler, and power Doppler as needed of all accessible portions of each vessel. Bilateral testing is considered an integral part of a complete examination. Limited examinations for reoccurring indications may be performed as noted. The reflux portion of the exam is performed with the patient in reverse Trendelenburg.  +-----+---------------+---------+-----------+----------+--------------+  RIGHT Compressibility Phasicity Spontaneity Properties Thrombus Aging  +-----+---------------+---------+-----------+----------+--------------+  CFV   Full            Yes       Yes                                    +-----+---------------+---------+-----------+----------+--------------+   +---------+---------------+---------+-----------+----------+--------------+  LEFT      Compressibility Phasicity Spontaneity Properties Thrombus Aging  +---------+---------------+---------+-----------+----------+--------------+  CFV       Full            Yes       Yes                                    +---------+---------------+---------+-----------+----------+--------------+  SFJ       Full                                                             +---------+---------------+---------+-----------+----------+--------------+  FV Prox   Full                                                             +---------+---------------+---------+-----------+----------+--------------+  FV Mid    Full                                                             +---------+---------------+---------+-----------+----------+--------------+  FV Distal Full                                                             +---------+---------------+---------+-----------+----------+--------------+  PFV       Full                                                             +---------+---------------+---------+-----------+----------+--------------+  POP       Full            Yes       Yes                                     +---------+---------------+---------+-----------+----------+--------------+  PTV       Full                                                             +---------+---------------+---------+-----------+----------+--------------+  PERO      Full                                                             +---------+---------------+---------+-----------+----------+--------------+ Arterial waveforms detected in the popliteal, posterior tibial, and peroneal arteries appear multiphasic.    Summary: RIGHT: - No evidence of common femoral vein obstruction.  LEFT: - There is no evidence of deep vein thrombosis in the lower extremity.  - No cystic structure found in the popliteal fossa.  *See table(s) above for measurements and observations. Electronically signed by Deitra Mayo MD on 02/02/2021 at 3:23:31 PM.    Final    VAS [place] LOWER EXTREMITY VENOUS (DVT) (ONLY [facility] & [facility])  Result Date: 01/19/2021  Lower Venous DVT Study Patient Name:  Joe Hill  Date of Exam:   01/19/2021 Medical Rec #: [id number]       Accession #:    [id number] Date of Birth: December 11, 1940       Patient Gender: M Patient Age:   29 years Exam Location:  [facility] Procedure:      VAS [place] LOWER EXTREMITY VENOUS (DVT) Referring Phys: Benjamine Mola REES --------------------------------------------------------------------------------  Indications: Edema.  Limitations: Poor ultrasound/tissue interface and body habitus. Comparison Study: 01/01/2021- negative lower extremity venous duplex Performing Technologist: Maudry Mayhew MHA, RDMS, RVT, RDCS  Examination Guidelines: A complete evaluation includes B-mode imaging, spectral Doppler, color Doppler, and power Doppler as needed of all accessible portions of each vessel. Bilateral testing is considered an integral part of a complete examination. Limited examinations for reoccurring indications may be performed as noted. The reflux portion of the exam is performed  with the patient in reverse Trendelenburg.  +-----+---------------+---------+-----------+----------+--------------+  RIGHT Compressibility Phasicity Spontaneity Properties Thrombus Aging  +-----+---------------+---------+-----------+----------+--------------+  CFV   Full            Yes       Yes                                    +-----+---------------+---------+-----------+----------+--------------+   +---------+---------------+---------+-----------+----------+--------------+  LEFT      Compressibility Phasicity Spontaneity Properties Thrombus Aging  +---------+---------------+---------+-----------+----------+--------------+  CFV       Full            Yes       Yes                                    +---------+---------------+---------+-----------+----------+--------------+  SFJ       Full                                                             +---------+---------------+---------+-----------+----------+--------------+  FV Prox   Full                                                             +---------+---------------+---------+-----------+----------+--------------+  FV Mid    Full                                                             +---------+---------------+---------+-----------+----------+--------------+  FV Distal Full                                                             +---------+---------------+---------+-----------+----------+--------------+  PFV       Full                                                             +---------+---------------+---------+-----------+----------+--------------+  POP       Full            Yes       Yes                                    +---------+---------------+---------+-----------+----------+--------------+  PTV       Full                                                             +---------+---------------+---------+-----------+----------+--------------+  PERO      Full                                                              +---------+---------------+---------+-----------+----------+--------------+    Summary: RIGHT: - No evidence of common femoral vein obstruction.  LEFT: - There is no evidence of deep vein thrombosis in the lower extremity.  - No cystic structure found in the popliteal fossa.  *See table(s) above for measurements and observations. Electronically signed by Jamelle Haring on 01/19/2021 at 1:05:26 PM.    Final      Future Appointments  Date Time Provider [place]  02/11/2021  4:00 PM [facility] None  02/18/2021 10:00 AM [facility] None  02/18/2021 10:30 AM Curt Bears, MD [facility] None  02/18/2021 11:30 AM [facility] INFUSION [facility] None  02/25/2021  4:00 PM [facility] None  03/04/2021  4:00 PM [facility] None  03/11/2021 10:00 AM [facility] None  03/11/2021 10:30 AM Heilingoetter, Cassandra L, PA-C [facility] None  03/11/2021 11:30 AM [facility] INFUSION [facility] None  03/18/2021  4:00 PM [facility] None  03/25/2021  4:00 PM [facility] None  04/01/2021  9:00 AM [facility] None  04/01/2021  9:30 AM Curt Bears, MD [facility] None  04/01/2021 10:30 AM [facility] INFUSION [facility] None  04/08/2021  4:00 PM [facility] None  04/15/2021  4:00 PM [facility] None  04/23/2021  9:00 AM [facility] None  04/23/2021  9:30 AM Heilingoetter, Cassandra L, PA-C [facility] None  04/23/2021 10:30 AM [facility] INFUSION [facility] None  05/12/2021  8:20 AM [name], MD [facility]      LOS: 0 days

## 2021-02-10 NOTE — ED Notes (Signed)
Blood bank has blood ready for this patient,notified Danielle,RN.

## 2021-02-10 NOTE — Assessment & Plan Note (Signed)
Heart rate ranging in 140s. Likely triggered from anemia inducing tachycardia. On Cardizem at home. Will initiate Cardizem infusion with bolus. Switch to progressive care unit. Echocardiogram in December 2022 shows EF of 40 to 45%.  Recently seen cardiologist, okay to be on Cardizem based on plan during that visit.

## 2021-02-10 NOTE — Assessment & Plan Note (Signed)
Discussed with patient.  He does have a living will. He would like to maintain DNR status for now. Patient verbalized understanding of what does DNR means.

## 2021-02-10 NOTE — Assessment & Plan Note (Signed)
Chemotherapy-induced pancytopenia. Last session of chemotherapy 01/29/21. CBC shows WBC 0.7, hemoglobin 5.9, platelet count 12. Patient has some evidence of bleeding on left upper lip area after fall. Syncope most likely secondary to severe anemia. Receiving 2 PRBC transfusion.  Transfuse for hemoglobin less than 8. Receiving 1 platelet transfusion.  Transfuse for platelet count less than 20,000 or active bleeding. Receiving Granix until ANC 1.0 or higher. Appreciate oncology assistance.

## 2021-02-10 NOTE — Telephone Encounter (Signed)
Opened in error

## 2021-02-10 NOTE — ED Provider Notes (Signed)
[facility] DEPT Provider Note   CSN: [id number] Arrival date & time: 01/31/2021  1228     History  Chief Complaint  Patient presents with   Near Syncope   Atrial Fibrillation    EZEKIAL Hill is a 81 y.o. male.  81 year old male with prior medical history as detailed below presents for evaluation.  Patient reports 2 episodes of syncope this morning.  Patient was at home.  He felt suddenly weak and then fell.  He reports full LOC which was brief.  He did strike his nose and his upper lip.  After this syncopal event he was able to rest until he felt better.  He then tried to stand and passed out again.  He denies associated chest pain or shortness of breath.  He is currently treated by Dr. [name] for non-small cell lung cancer.   He reports recent history of thrombocytopenia and significant nosebleed.  He is currently not taking Eliquis.  He does have history of paroxysmal atrial fibrillation.  The history is provided by the patient.  Loss of Consciousness Episode history:  Multiple Most recent episode:  Today Duration:  10 minutes Timing:  Rare Progression:  Improving Chronicity:  New     Home Medications Prior to Admission medications   Medication Sig Start Date End Date Taking? Authorizing Provider  albuterol (VENTOLIN HFA) 108 (90 Base) MCG/ACT inhaler TAKE 2 PUFFS BY MOUTH EVERY 6 HOURS AS NEEDED FOR WHEEZE OR SHORTNESS OF BREATH Patient taking differently: 2 puffs every 6 (six) hours as needed for wheezing or shortness of breath. 01/07/21   [name], MD  clindamycin (CLEOCIN) 300 MG capsule Take 1 capsule (300 mg total) by mouth 4 (four) times daily. X 7 days 01/27/21   Veryl Speak, MD  dextromethorphan-guaiFENesin [facility] DM) 30-600 MG 12hr tablet Take 1 tablet by mouth 2 (two) times daily as needed for cough.    [provider]  diltiazem (CARDIZEM CD) 240 MG 24 hr capsule Take 1 capsule (240 mg total) by mouth daily.  01/25/21   Joe Slocumb, DO  feeding supplement (BOOST HIGH PROTEIN) LIQD Take 1 Container by mouth 2 (two) times daily as needed (nutrition).    [provider]  folic acid (FOLVITE) 1 MG tablet Take 1 tablet (1 mg total) by mouth daily. 11/18/20   Curt Bears, MD  ibuprofen (ADVIL) 400 MG tablet Take 400 mg by mouth every 6 (six) hours as needed for headache.    [provider]  pantoprazole (PROTONIX) 40 MG tablet Take 1 tablet (40 mg total) by mouth daily. Patient taking differently: Take 40 mg by mouth daily as needed (for acid reflux). 01/02/21 04/02/21  [place] Donnamarie Poag, DO  prochlorperazine (COMPAZINE) 10 MG tablet Take 1 tablet (10 mg total) by mouth every 6 (six) hours as needed for nausea or vomiting. 11/18/20   Curt Bears, MD  triamcinolone cream (KENALOG) 0.5 % Apply topically 4 (four) times daily as needed (irritation to skin lower extremity). 01/25/21   Joe Slocumb, DO      Allergies    Penicillins    Review of Systems   Review of Systems  Cardiovascular:  Positive for syncope.  All other systems reviewed and are negative.  Physical Exam Updated Vital Signs BP 122/77    Pulse 80    Resp 17    Ht 5\' 10"  (1.778 m)    Wt 79 kg    SpO2 94%    BMI  24.99 kg/m  Physical Exam Vitals and nursing note reviewed.  Constitutional:      General: He is not in acute distress.    Appearance: Normal appearance. He is well-developed.  HENT:     Head: Normocephalic and atraumatic.  Eyes:     Conjunctiva/sclera: Conjunctivae normal.     Pupils: Pupils are equal, round, and reactive to light.  Cardiovascular:     Rate and Rhythm: Tachycardia present. Rhythm irregular.     Heart sounds: Normal heart sounds.  Pulmonary:     Effort: Pulmonary effort is normal. No respiratory distress.     Breath sounds: Normal breath sounds.  Abdominal:     General: There is no distension.     Palpations: Abdomen is soft.     Tenderness: There is no abdominal tenderness.   Musculoskeletal:        General: No deformity. Normal range of motion.     Cervical back: Normal range of motion and neck supple.  Skin:    General: Skin is warm and dry.  Neurological:     General: No focal deficit present.     Mental Status: He is alert and oriented to person, place, and time. Mental status is at baseline.    ED Results / Procedures / Treatments   Labs (all labs ordered are listed, but only abnormal results are displayed) Labs Reviewed  CBC WITH DIFFERENTIAL/PLATELET - Abnormal; Notable for the following components:      Result Value   WBC 0.7 (*)    RBC 1.76 (*)    Hemoglobin 5.9 (*)    HCT 17.5 (*)    RDW 21.3 (*)    Platelets 12 (*)    All other components within normal limits  COMPREHENSIVE METABOLIC PANEL - Abnormal; Notable for the following components:   Glucose, Bld 133 (*)    BUN 35 (*)    Calcium 8.6 (*)    Total Protein 6.2 (*)    Albumin 3.1 (*)    All other components within normal limits  D-DIMER, QUANTITATIVE - Abnormal; Notable for the following components:   D-Dimer, Quant 1.76 (*)    All other components within normal limits  RESP PANEL BY RT-PCR (FLU A&B, COVID) ARPGX2  PROTIME-INR  TYPE AND SCREEN  PREPARE RBC (CROSSMATCH)  TROPONIN I (HIGH SENSITIVITY)  TROPONIN I (HIGH SENSITIVITY)    EKG None  Radiology CT Head Wo Contrast  Result Date: 02/24/2021 CLINICAL DATA:  Syncopal episodes.  Fell. EXAM: CT HEAD WITHOUT CONTRAST CT CERVICAL SPINE WITHOUT CONTRAST TECHNIQUE: Multidetector CT imaging of the head and cervical spine was performed following the standard protocol without intravenous contrast. Multiplanar CT image reconstructions of the cervical spine were also generated. RADIATION DOSE REDUCTION: This exam was performed according to the departmental dose-optimization program which includes automated exposure control, adjustment of the mA and/or kV according to patient size and/or use of iterative reconstruction technique.  COMPARISON:  12/26/2020 FINDINGS: CT HEAD FINDINGS Brain: Stable age related cerebral atrophy, ventriculomegaly and periventricular white matter disease. No extra-axial fluid collections are identified. No CT findings for acute hemispheric infarction or intracranial hemorrhage. No mass lesions. The brainstem and cerebellum are normal. Vascular: Stable vascular calcifications. No aneurysm or hyperdense vessels. Skull: No skull fracture or bone lesions. Sinuses/Orbits: The paranasal sinuses and mastoid air cells are clear. The globes are intact. Other: No scalp lesions or scalp hematoma. CT CERVICAL SPINE FINDINGS Alignment: Normal Skull base and vertebrae: No acute fracture. No primary bone lesion or  focal pathologic process. Soft tissues and spinal canal: No prevertebral fluid or swelling. No visible canal hematoma. Disc levels: Degenerative cervical spondylosis with multilevel disc disease and facet disease bulging discs osteophytic ridging but no significant spinal or foraminal stenosis. Upper chest: No significant findings. Emphysematous changes and pulmonary scarring. Other: No neck mass or adenopathy. IMPRESSION: 1. Stable age related cerebral atrophy, ventriculomegaly and periventricular white matter disease. 2. No acute intracranial findings or skull fracture. 3. Degenerative cervical spondylosis but no acute cervical spine fracture. Electronically Signed   By: Marijo Sanes M.D.   On: 02/08/2021 14:44   CT Cervical Spine Wo Contrast  Result Date: 02/04/2021 CLINICAL DATA:  Syncopal episodes.  Fell. EXAM: CT HEAD WITHOUT CONTRAST CT CERVICAL SPINE WITHOUT CONTRAST TECHNIQUE: Multidetector CT imaging of the head and cervical spine was performed following the standard protocol without intravenous contrast. Multiplanar CT image reconstructions of the cervical spine were also generated. RADIATION DOSE REDUCTION: This exam was performed according to the departmental dose-optimization program which includes  automated exposure control, adjustment of the mA and/or kV according to patient size and/or use of iterative reconstruction technique. COMPARISON:  12/26/2020 FINDINGS: CT HEAD FINDINGS Brain: Stable age related cerebral atrophy, ventriculomegaly and periventricular white matter disease. No extra-axial fluid collections are identified. No CT findings for acute hemispheric infarction or intracranial hemorrhage. No mass lesions. The brainstem and cerebellum are normal. Vascular: Stable vascular calcifications. No aneurysm or hyperdense vessels. Skull: No skull fracture or bone lesions. Sinuses/Orbits: The paranasal sinuses and mastoid air cells are clear. The globes are intact. Other: No scalp lesions or scalp hematoma. CT CERVICAL SPINE FINDINGS Alignment: Normal Skull base and vertebrae: No acute fracture. No primary bone lesion or focal pathologic process. Soft tissues and spinal canal: No prevertebral fluid or swelling. No visible canal hematoma. Disc levels: Degenerative cervical spondylosis with multilevel disc disease and facet disease bulging discs osteophytic ridging but no significant spinal or foraminal stenosis. Upper chest: No significant findings. Emphysematous changes and pulmonary scarring. Other: No neck mass or adenopathy. IMPRESSION: 1. Stable age related cerebral atrophy, ventriculomegaly and periventricular white matter disease. 2. No acute intracranial findings or skull fracture. 3. Degenerative cervical spondylosis but no acute cervical spine fracture. Electronically Signed   By: Marijo Sanes M.D.   On: 02/17/2021 14:44   DG Chest Port 1 View  Result Date: 02/02/2021 CLINICAL DATA:  Palpitation. Near syncope. Fall. History of lung cancer. EXAM: PORTABLE CHEST 1 VIEW COMPARISON:  01/26/2021 FINDINGS: Heart size is normal. No signs of pleural effusion or edema. No superimposed airspace consolidation identified. The visualized osseous structures are unremarkable. IMPRESSION: No acute  cardiopulmonary abnormalities. Electronically Signed   By: Kerby Moors M.D.   On: 01/26/2021 13:12   VAS [place] LOWER EXTREMITY VENOUS (DVT) (7a-7p)  Result Date: 01/27/2021  Lower Venous DVT Study Patient Name:  AZAVION BOUILLON  Date of Exam:   01/26/2021 Medical Rec #: [id number]       Accession #:    [id number] Date of Birth: November 09, 1940       Patient Gender: M Patient Age:   53 years Exam Location:  [facility] Procedure:      VAS [place] LOWER EXTREMITY VENOUS (DVT) Referring Phys: Collier Salina Grizelda Piscopo --------------------------------------------------------------------------------  Indications: Edema.  Comparison Study: 01/01/2021 - RIGHT:                   - There is no evidence of deep vein thrombosis in the lower  extremity.                    - No cystic structure found in the popliteal fossa.                    LEFT:                   - There is no evidence of deep vein thrombosis in the lower                   extremity.                    - No cystic structure found in the popliteal fossa.                    01/19/2021 - RIGHT:                   - No evidence of common femoral vein obstruction.                    LEFT:                   - There is no evidence of deep vein thrombosis in the lower                   extremity.                    - No cystic structure found in the popliteal fossa. Performing Technologist: Oliver Hum RVT  Examination Guidelines: A complete evaluation includes B-mode imaging, spectral Doppler, color Doppler, and power Doppler as needed of all accessible portions of each vessel. Bilateral testing is considered an integral part of a complete examination. Limited examinations for reoccurring indications may be performed as noted. The reflux portion of the exam is performed with the patient in reverse Trendelenburg.  +-----+---------------+---------+-----------+----------+--------------+  RIGHT Compressibility Phasicity Spontaneity Properties Thrombus Aging   +-----+---------------+---------+-----------+----------+--------------+  CFV   Full            Yes       Yes                                    +-----+---------------+---------+-----------+----------+--------------+   +---------+---------------+---------+-----------+----------+--------------+  LEFT      Compressibility Phasicity Spontaneity Properties Thrombus Aging  +---------+---------------+---------+-----------+----------+--------------+  CFV       Full            Yes       Yes                                    +---------+---------------+---------+-----------+----------+--------------+  SFJ       Full                                                             +---------+---------------+---------+-----------+----------+--------------+  FV Prox   Full                                                             +---------+---------------+---------+-----------+----------+--------------+  FV Mid    Full                                                             +---------+---------------+---------+-----------+----------+--------------+  FV Distal Full                                                             +---------+---------------+---------+-----------+----------+--------------+  PFV       Full                                                             +---------+---------------+---------+-----------+----------+--------------+  POP       Full            Yes       Yes                                    +---------+---------------+---------+-----------+----------+--------------+  PTV       Full                                                             +---------+---------------+---------+-----------+----------+--------------+  PERO      Full                                                             +---------+---------------+---------+-----------+----------+--------------+ Arterial waveforms detected in the popliteal, posterior tibial, and peroneal arteries appear multiphasic.    Summary: RIGHT: - No  evidence of common femoral vein obstruction.  LEFT: - There is no evidence of deep vein thrombosis in the lower extremity.  - No cystic structure found in the popliteal fossa.  *See table(s) above for measurements and observations. Electronically signed by Deitra Mayo MD on 01/25/2021 at 3:23:31 PM.    Final     Procedures Procedures    Medications Ordered in ED Medications  0.9 %  sodium chloride infusion (has no administration in time range)  sodium chloride 0.9 % bolus 1,000 mL (0 mLs Intravenous Stopped 02/19/2021 1459)  iohexol (OMNIPAQUE) 300 MG/ML solution 90 mL (90 mLs Intravenous Contrast Given 02/14/2021 1550)    ED Course/ Medical Decision Making/ A&P                           Medical Decision Making Amount and/or Complexity of Data Reviewed Labs: ordered. Radiology: ordered. ECG/medicine tests: ordered.  Risk Prescription drug management.    Medical Screen Complete  This  patient presented to the ED with complaint of syncope.  This complaint involves an extensive number of treatment options. The initial differential diagnosis includes, but is not limited to, ACS, metabolic abnormality, symptomatic anemia, intracranial injury,etc  This presentation is: Acute, Chronic, Self-Limited, Previously Undiagnosed, Uncertain Prognosis, Complicated, Systemic Symptoms, and Threat to Life/Bodily Function   Presented after syncopal event that occurred at home  Patient with findings of pancytopenia on work-up today.  CT head and CT C-spine are without significant acute traumatic findings  Patient would benefit from transfusion and admission  Message left with Dr. Lew Dawes service regarding patient's case.  Hospitalist services aware of  case and will evaluate for admission. Hospitalist aware of pending read on CT Chest.   Transfusion of PRBCs initiated.    Co morbidities that complicated the patient's evaluation  advanced age, history of non-small cell lung  cancer   Additional history obtained:   External records from outside sources obtained and reviewed including prior ED visits and prior Inpatient records.    Lab Tests:  I ordered and personally interpreted labs.  The pertinent results include: CBC, CMP, D-dimer, troponin, COVID   Imaging Studies ordered:  I ordered imaging studies including CT head, CT C-spine, CTA chest I independently visualized and interpreted obtained imaging which showed no acute pathology I agree with the radiologist interpretation.   Cardiac Monitoring:  The patient was maintained on a cardiac monitor.  I personally viewed and interpreted the cardiac monitor which showed an underlying rhythm of: Atrial fibrillation   Medicines ordered:  I ordered medication including transfusion of PRBC for anemia Reevaluation of the patient after these medicines showed that the patient: stayed the same   Consultations Obtained:  I consulted Dr. Olevia Bowens with the hospitalist service,  and discussed lab and imaging findings as well as pertinent plan of care. He will evaluated for admission.    Problem List / ED Course:  Syncope, pancytopenia   Reevaluation:  After the interventions noted above, I reevaluated the patient and found that they have: stayed the same   Dispostion:  After consideration of the diagnostic results and the patients response to treatment, I feel that the patent would benefit from admission.   CRITICAL CARE Performed by: Valarie Merino   Total critical care time: 30 minutes  Critical care time was exclusive of separately billable procedures and treating other patients.  Critical care was necessary to treat or prevent imminent or life-threatening deterioration.  Critical care was time spent personally by me on the following activities: development of treatment plan with patient and/or surrogate as well as nursing, discussions with consultants, evaluation of patient's response to  treatment, examination of patient, obtaining history from patient or surrogate, ordering and performing treatments and interventions, ordering and review of laboratory studies, ordering and review of radiographic studies, pulse oximetry and re-evaluation of patient's condition.         Final Clinical Impression(s) / ED Diagnoses Final diagnoses:  Syncope, unspecified syncope type  Anemia, unspecified type    Rx / DC Orders ED Discharge Orders     None         Valarie Merino, MD 02/17/2021 1615

## 2021-02-10 NOTE — Assessment & Plan Note (Signed)
Patient has dry skin, reports ulcer being present for last 3 to 4 weeks. He has been applying some cream and doing dressing on his own but currently does not have any dressing on and socks are sticking to the ulcer. Requested RN to remove the sock after moistening with saline and apply Xeroform gauze. Wound care consulted.

## 2021-02-10 NOTE — ED Triage Notes (Signed)
Pt BIB EMS from home. 2 near syncopal episodes this AM. [place] and hit nose and tip of lip. Hx of lung cancer, received 4 rounds of chemo. Recently (one week) taken off eliquis and cardizem, started lasix. 20 gauge, RAC. Telemetry A-fib, received 500 ml fluids  BP 95/58 P 100-160 RR 20 spO2 100% RA CBG 190

## 2021-02-10 NOTE — Progress Notes (Signed)
Left lower extremity venous duplex has been completed. Preliminary results can be found in CV Proc through chart review.  Results were given to Dr. Francia Greaves.  02/17/2021 1:23 PM Joe Hill RVT

## 2021-02-10 NOTE — Assessment & Plan Note (Signed)
Volume status appears to be stable. Currently does not require any diuretics.  Monitor. EF 40 to 45%. Global wall motion abnormality. On Cardizem for rate control although would benefit from being on beta-blockers. Given that the patient is already following up with cardiology as well as already on Cardizem, I will continue Cardizem infusion for rate control.

## 2021-02-10 NOTE — Assessment & Plan Note (Signed)
Stage IV non-small cell lung cancer. Receiving systemic chemotherapy, carboplatin, Alimta, Keytruda. Last cycle 1/5. Appreciate oncology assistance.

## 2021-02-10 NOTE — ED Notes (Addendum)
Previous RN who initiated transfusion did not document vital signs pre or 15 min into transfusion.

## 2021-02-10 NOTE — ED Notes (Signed)
Open area on top of left foot cleaned and bandaged at this time.

## 2021-02-10 NOTE — Assessment & Plan Note (Signed)
Prior history. For now monitor.

## 2021-02-10 NOTE — Assessment & Plan Note (Signed)
Presents with 2 syncopal event. Currently no focal deficit. Suspect syncope in the setting of pancytopenia specifically anemia. No focal deficit at the time of my evaluation.  No murmur. No significant prior history signifying dehydration. Monitor on telemetry.

## 2021-02-10 NOTE — Assessment & Plan Note (Signed)
Use PPI as needed. For now monitor.

## 2021-02-11 ENCOUNTER — Observation Stay (HOSPITAL_COMMUNITY): Payer: Medicare Other

## 2021-02-11 ENCOUNTER — Inpatient Hospital Stay: Payer: Medicare Other

## 2021-02-11 DIAGNOSIS — K219 Gastro-esophageal reflux disease without esophagitis: Secondary | ICD-10-CM | POA: Diagnosis present

## 2021-02-11 DIAGNOSIS — L97521 Non-pressure chronic ulcer of other part of left foot limited to breakdown of skin: Secondary | ICD-10-CM | POA: Diagnosis present

## 2021-02-11 DIAGNOSIS — C3412 Malignant neoplasm of upper lobe, left bronchus or lung: Secondary | ICD-10-CM | POA: Diagnosis present

## 2021-02-11 DIAGNOSIS — R5081 Fever presenting with conditions classified elsewhere: Secondary | ICD-10-CM | POA: Diagnosis present

## 2021-02-11 DIAGNOSIS — Z20822 Contact with and (suspected) exposure to covid-19: Secondary | ICD-10-CM | POA: Diagnosis present

## 2021-02-11 DIAGNOSIS — R579 Shock, unspecified: Secondary | ICD-10-CM | POA: Diagnosis present

## 2021-02-11 DIAGNOSIS — D696 Thrombocytopenia, unspecified: Secondary | ICD-10-CM | POA: Diagnosis present

## 2021-02-11 DIAGNOSIS — I5022 Chronic systolic (congestive) heart failure: Secondary | ICD-10-CM | POA: Diagnosis present

## 2021-02-11 DIAGNOSIS — N179 Acute kidney failure, unspecified: Secondary | ICD-10-CM | POA: Diagnosis present

## 2021-02-11 DIAGNOSIS — I110 Hypertensive heart disease with heart failure: Secondary | ICD-10-CM | POA: Diagnosis present

## 2021-02-11 DIAGNOSIS — W1839XA Other fall on same level, initial encounter: Secondary | ICD-10-CM | POA: Diagnosis present

## 2021-02-11 DIAGNOSIS — T451X5A Adverse effect of antineoplastic and immunosuppressive drugs, initial encounter: Secondary | ICD-10-CM | POA: Diagnosis present

## 2021-02-11 DIAGNOSIS — A419 Sepsis, unspecified organism: Secondary | ICD-10-CM | POA: Diagnosis present

## 2021-02-11 DIAGNOSIS — K8020 Calculus of gallbladder without cholecystitis without obstruction: Secondary | ICD-10-CM | POA: Diagnosis not present

## 2021-02-11 DIAGNOSIS — Y92238 Other place in hospital as the place of occurrence of the external cause: Secondary | ICD-10-CM | POA: Diagnosis not present

## 2021-02-11 DIAGNOSIS — G319 Degenerative disease of nervous system, unspecified: Secondary | ICD-10-CM | POA: Diagnosis not present

## 2021-02-11 DIAGNOSIS — Z801 Family history of malignant neoplasm of trachea, bronchus and lung: Secondary | ICD-10-CM | POA: Diagnosis not present

## 2021-02-11 DIAGNOSIS — R55 Syncope and collapse: Secondary | ICD-10-CM | POA: Diagnosis present

## 2021-02-11 DIAGNOSIS — Z87828 Personal history of other (healed) physical injury and trauma: Secondary | ICD-10-CM | POA: Diagnosis not present

## 2021-02-11 DIAGNOSIS — J3800 Paralysis of vocal cords and larynx, unspecified: Secondary | ICD-10-CM | POA: Diagnosis present

## 2021-02-11 DIAGNOSIS — M545 Low back pain, unspecified: Secondary | ICD-10-CM | POA: Diagnosis not present

## 2021-02-11 DIAGNOSIS — Y92009 Unspecified place in unspecified non-institutional (private) residence as the place of occurrence of the external cause: Secondary | ICD-10-CM | POA: Diagnosis not present

## 2021-02-11 DIAGNOSIS — I4811 Longstanding persistent atrial fibrillation: Secondary | ICD-10-CM | POA: Diagnosis present

## 2021-02-11 DIAGNOSIS — D709 Neutropenia, unspecified: Secondary | ICD-10-CM | POA: Diagnosis present

## 2021-02-11 DIAGNOSIS — S0990XA Unspecified injury of head, initial encounter: Secondary | ICD-10-CM | POA: Diagnosis not present

## 2021-02-11 DIAGNOSIS — E872 Acidosis, unspecified: Secondary | ICD-10-CM | POA: Diagnosis present

## 2021-02-11 DIAGNOSIS — J9601 Acute respiratory failure with hypoxia: Secondary | ICD-10-CM | POA: Diagnosis present

## 2021-02-11 DIAGNOSIS — F17210 Nicotine dependence, cigarettes, uncomplicated: Secondary | ICD-10-CM | POA: Diagnosis present

## 2021-02-11 DIAGNOSIS — Z8546 Personal history of malignant neoplasm of prostate: Secondary | ICD-10-CM | POA: Diagnosis not present

## 2021-02-11 DIAGNOSIS — J439 Emphysema, unspecified: Secondary | ICD-10-CM | POA: Diagnosis present

## 2021-02-11 DIAGNOSIS — I4819 Other persistent atrial fibrillation: Secondary | ICD-10-CM | POA: Diagnosis not present

## 2021-02-11 DIAGNOSIS — Z66 Do not resuscitate: Secondary | ICD-10-CM | POA: Diagnosis present

## 2021-02-11 DIAGNOSIS — Z515 Encounter for palliative care: Secondary | ICD-10-CM | POA: Diagnosis not present

## 2021-02-11 DIAGNOSIS — D61810 Antineoplastic chemotherapy induced pancytopenia: Secondary | ICD-10-CM | POA: Diagnosis present

## 2021-02-11 DIAGNOSIS — W06XXXA Fall from bed, initial encounter: Secondary | ICD-10-CM | POA: Diagnosis not present

## 2021-02-11 LAB — TYPE AND SCREEN
ABO/RH(D): O POS
Antibody Screen: NEGATIVE
Unit division: 0
Unit division: 0
Unit division: 0
Unit division: 0

## 2021-02-11 LAB — BPAM RBC
Blood Product Expiration Date: 202302142359
Blood Product Expiration Date: 202302142359
Blood Product Expiration Date: 202302142359
Blood Product Expiration Date: 202302162359
ISSUE DATE / TIME: 202301171829
ISSUE DATE / TIME: 202301172217
ISSUE DATE / TIME: 202301180711
Unit Type and Rh: 5100
Unit Type and Rh: 5100
Unit Type and Rh: 5100
Unit Type and Rh: 5100

## 2021-02-11 LAB — CBC WITH DIFFERENTIAL/PLATELET
HCT: 22 % — ABNORMAL LOW (ref 39.0–52.0)
Hemoglobin: 7.5 g/dL — ABNORMAL LOW (ref 13.0–17.0)
MCH: 32.5 pg (ref 26.0–34.0)
MCHC: 34.1 g/dL (ref 30.0–36.0)
MCV: 95.2 fL (ref 80.0–100.0)
Platelets: 15 10*3/uL — CL (ref 150–400)
RBC: 2.31 MIL/uL — ABNORMAL LOW (ref 4.22–5.81)
RDW: 20.7 % — ABNORMAL HIGH (ref 11.5–15.5)
WBC: 0.3 10*3/uL — CL (ref 4.0–10.5)
nRBC: 6.7 % — ABNORMAL HIGH (ref 0.0–0.2)

## 2021-02-11 LAB — COMPREHENSIVE METABOLIC PANEL
ALT: 43 U/L (ref 0–44)
AST: 45 U/L — ABNORMAL HIGH (ref 15–41)
Albumin: 3.3 g/dL — ABNORMAL LOW (ref 3.5–5.0)
Alkaline Phosphatase: 96 U/L (ref 38–126)
Anion gap: 10 (ref 5–15)
BUN: 39 mg/dL — ABNORMAL HIGH (ref 8–23)
CO2: 25 mmol/L (ref 22–32)
Calcium: 8.5 mg/dL — ABNORMAL LOW (ref 8.9–10.3)
Chloride: 103 mmol/L (ref 98–111)
Creatinine, Ser: 1.18 mg/dL (ref 0.61–1.24)
GFR, Estimated: >60 mL/min (ref >60)
Glucose, Bld: 115 mg/dL — ABNORMAL HIGH (ref 70–99)
Potassium: 4.1 mmol/L (ref 3.5–5.1)
Sodium: 138 mmol/L (ref 135–145)
Total Bilirubin: 2.7 mg/dL — ABNORMAL HIGH (ref 0.3–1.2)
Total Protein: 6.3 g/dL — ABNORMAL LOW (ref 6.5–8.1)

## 2021-02-11 LAB — MAGNESIUM: Magnesium: 1.7 mg/dL (ref 1.7–2.4)

## 2021-02-11 MED ORDER — TRAMADOL HCL 50 MG PO TABS
50.0000 mg | ORAL_TABLET | Freq: Four times a day (QID) | ORAL | Status: DC | PRN
  Administered 2021-02-11 – 2021-02-12 (×3): 50 mg via ORAL
  Filled 2021-02-11 (×3): qty 1

## 2021-02-11 MED ORDER — ACETAMINOPHEN 325 MG PO TABS
650.0000 mg | ORAL_TABLET | Freq: Once | ORAL | Status: AC
  Administered 2021-02-11: 650 mg via ORAL
  Filled 2021-02-11: qty 2

## 2021-02-11 MED ORDER — FUROSEMIDE 10 MG/ML IJ SOLN
20.0000 mg | Freq: Once | INTRAMUSCULAR | Status: AC
  Administered 2021-02-11: 20 mg via INTRAVENOUS
  Filled 2021-02-11: qty 4

## 2021-02-11 MED ORDER — DILTIAZEM HCL ER COATED BEADS 180 MG PO CP24
180.0000 mg | ORAL_CAPSULE | Freq: Every day | ORAL | Status: DC
  Administered 2021-02-11 – 2021-02-12 (×2): 180 mg via ORAL
  Filled 2021-02-11 (×2): qty 1

## 2021-02-11 MED ORDER — SODIUM CHLORIDE 0.9% IV SOLUTION: Freq: Once | INTRAVENOUS | Status: AC

## 2021-02-11 NOTE — Progress Notes (Signed)
PT Cancellation Note  Patient Details Name: Joe Hill MRN: [id number] DOB: Nov 19, 1940   Cancelled Treatment:    Reason Eval/Treat Not Completed: Other (comment)  Pt seen by OT earlier and did have drop in BP but asymptomatic.  PT attempted to see later this evening , but spoke with RN who reports pt had been shaking and in lots of pain and is starting to calm down, stated could check with pt.  Checked with pt and he was still somewhat shakey, in pain, was sitting EOB and wanting to lay down.  Possibly some confusion too (Stated wanted to get in bath tub to go to bed, but was just trying to say he wanted to lay down).  Pt not able to participate with PT today.    Abran Richard, PT Acute Rehab Services Pager [phone] [facility] [phone]  Karlton Lemon 02/11/2021, 5:20 PM

## 2021-02-11 NOTE — ED Notes (Addendum)
Patient placed on hospital bed for comfort °

## 2021-02-11 NOTE — ED Notes (Signed)
Pt given lunch tray.

## 2021-02-11 NOTE — ED Notes (Signed)
Notified by [facility] staff that this pt was on the floor.  Upon entering room, pt found on the floor on his right side. CN notified and at bedside. Pt c/o elbow and hand pain from fall. Pt stated that he tried to turn over in the bed and rolled out. No posey alarm box in room. Pt was assisted with standing and sitting on bed. Pt reconnected to all IVs and monitoring devices by CN. Pt given blankets and urinal. Call light w/in reach. Pt denies further needs at this time.   **Posey fall alarm turned on at this time.**

## 2021-02-11 NOTE — Progress Notes (Addendum)
° ° °  OVERNIGHT PROGRESS REPORT   Notified by RN for patient having fall in room. ER staff was notified by EVS that the patient was found on the floor on his right side.  On bedside visit patient is awake and oriented and can explain events prior to his arrival at the hospital. He is also aware of his surroundings and why he is in the hospital. He is awake and oriented x4.  Patient stated that he dropped something onto the floor in the ER and leaned over to pick it up and fell over the bed side onto the floor. He states he did not lose consciousness. He states he did not hit his head. He states he was not dizzy at the time.  He has no complaints at this time. He has movement of extremities x4. Grip strength are approximately equal, foot strength is approximately equal, leg strength is approximately equal.  Pulse, motor, sensation is found in all 4 extremities. Patient has no double vision. Pupils are at baseline. Patient has no tenderness in the abdomen/flanks.  CT of the head pending. Patient is not on blood thinners at this time  Post transfusion of blood and platelets lab work is pending.   UPDATE : [id number] Hrs   CT (in part)  "Skull: Normal. Negative for fracture or focal lesion.   Sinuses/Orbits: No acute finding.   Other: None   IMPRESSION: 1. No acute intracranial abnormalities. 2. 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     Electronically Signed   By: Kerby Moors M.D.   On: 02/11/2021 06:28 "      Gershon Cull MSNA MSN ACNPC-AG Acute Care Nurse Practitioner [place]

## 2021-02-11 NOTE — Hospital Course (Signed)
81 y/o blk male Stage IV NSCLC/carboplatin, Alimta, Keytruda (last cycle 01/29/2021)-Dr. Mohammed HFrEF Partial vocal cord paralysis P A. fib/Eliquis COPD and smoking history Recent admission LLL ulcer 3 to 4 weeks-has been dressing this on his own  admission 12/29--01/25/2021 recurrent severe epistaxis after being admitted earlier that month-hospitalization complicated TCP Prior admission 12/18-12/19 LLL Pneumonia Prior to admission 12/2-12/8 multifocal pneumonia syncope A. fib RVR and significant pancytopenia  Readmit 01/30/2021 2 episodes syncope, 1 episode fell hit nose tip of lip--blood pressure 95/58 noted at triage received 500 cc fluids -Patient had complete loss of consciousness Lab =pancytopenia, hemoglobin 5.9, platelets 12,000,  imaging work-up = CT head CT spine negative for acute traumatic findings CT chest negative for PE stable LUL mass  Oncology consulted-Granix started Patient subsequently had a fall in the emergency room 02/11/2021 AM with no deficit

## 2021-02-11 NOTE — Evaluation (Addendum)
Occupational Therapy Evaluation Patient Details Name: Joe Hill MRN: [id number] DOB: Dec 17, 1940 Today's Date: 02/11/2021   History of Present Illness Joe Hill is a 81 y.o. male with medical history significant of past medical history of stage IV non-small cell lung cancer on chemotherapy and immunotherapy, COPD, chronic systolic CHF, persistent A. 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  Presents with complaints of syncopal event.   Clinical Impression   Joe Hill is an 81 year old man who presents with decreased activity tolerance and impaired balance. Patient orthostatic but not symptomatic. Pt's BP dropped to 76/62 in sitting from 105/66. Therapist had patient perform upper body and lower body exercises to increased BP to 88/51. With standing BP 77/45. Overall patient is min guard with standing and marching and all ADLs for safety. Patient will benefit from skilled OT services while in hospital to improve deficits and learn compensatory strategies as needed in order to return to PLOF.  Expect patient will not need OT services at discharge.      Recommendations for follow up therapy are one component of a multi-disciplinary discharge planning process, led by the attending physician.  Recommendations may be updated based on patient status, additional functional criteria and insurance authorization.   Follow Up Recommendations  No OT follow up    Assistance Recommended at Discharge None  Patient can return home with the following A little help with walking and/or transfers;Two people to help with walking and/or transfers    Functional Status Assessment  Patient has had a recent decline in their functional status and demonstrates the ability to make significant improvements in function in a reasonable and predictable amount of time.  Equipment Recommendations  None recommended by OT    Recommendations for Other  Services       Precautions / Restrictions Precautions Precautions: Fall Restrictions Weight Bearing Restrictions: No         Balance Overall balance assessment: History of Falls, Mild deficits observed, not formally tested                                         ADL either performed or assessed with clinical judgement   ADL Overall ADL's : Needs assistance/impaired Eating/Feeding: Independent   Grooming: Sitting;Set up   Upper Body Bathing: Set up;Sitting   Lower Body Bathing: Min guard;Sit to/from stand   Upper Body Dressing : Set up;Sitting   Lower Body Dressing: Min guard;Sit to/from stand   Toilet Transfer: Min guard;BSC/3in1   Toileting- Water quality scientist and Hygiene: Min guard;Sit to/from stand       Functional mobility during ADLs: Min guard General ADL Comments: to march at side of bed. BPs monitored during evaluation.     Vision Patient Visual Report: No change from baseline       Perception     Praxis      Pertinent Vitals/Pain Pain Assessment Pain Assessment: No/denies pain     Hand Dominance Left   Extremity/Trunk Assessment Upper Extremity Assessment Upper Extremity Assessment: Overall WFL for tasks assessed   Lower Extremity Assessment Lower Extremity Assessment: Defer to PT evaluation   Cervical / Trunk Assessment Cervical / Trunk Assessment: Normal   Communication Communication Communication: No difficulties   Cognition Arousal/Alertness: Awake/alert Behavior During Therapy: WFL for tasks assessed/performed Overall Cognitive Status: Within Functional Limits for tasks assessed  Home Living Family/patient expects to be discharged to:: Private residence Living Arrangements: Alone Available Help at Discharge: Family;Available PRN/intermittently Type of Home: House Home Access: Stairs to enter [employer] of Steps: 2 Entrance  Stairs-Rails: None Home Layout: One level     Bathroom Shower/Tub: Teacher, early years/pre: Standard     Home Equipment: Conservation officer, nature (2 wheels);Wheelchair - manual;Cane - single point;Shower seat;Grab bars - toilet   Additional Comments: son works during the day.      Prior Functioning/Environment Prior Level of Function : Independent/Modified Independent;Driving                        OT Problem List: Decreased activity tolerance;Impaired balance (sitting and/or standing)      OT Treatment/Interventions: Self-care/ADL training;Therapeutic activities;Patient/family education;Balance training    OT Goals(Current goals can be found in the care plan section) Acute Rehab OT Goals Patient Stated Goal: get up and walk OT Goal Formulation: With patient Time For Goal Achievement: 03/04/21 Potential to Achieve Goals: Good ADL Goals Pt Will Transfer to Toilet: with supervision;ambulating;regular height toilet Pt Will Perform Toileting - Clothing Manipulation and hygiene: with supervision;sit to/from stand Additional ADL Goal #1: Patient will stand at sink to perform grooming task as evidence of improving activity tolerance  OT Frequency: Min 1X/week    Co-evaluation              AM-PAC OT "6 Clicks" Daily Activity     Outcome Measure Help from another person eating meals?: None Help from another person taking care of personal grooming?: A Little Help from another person toileting, which includes using toliet, bedpan, or urinal?: A Little Help from another person bathing (including washing, rinsing, drying)?: A Little Help from another person to put on and taking off regular upper body clothing?: A Little Help from another person to put on and taking off regular lower body clothing?: A Little 6 Click Score: 19   End of Session Nurse Communication: Mobility status  Activity Tolerance: Patient tolerated treatment well Patient left: in bed;with call  bell/phone within reach;with bed alarm set  OT Visit Diagnosis: History of falling (Z91.81);Dizziness and giddiness (R42)                Time: 9163-8466 OT Time Calculation (min): 13 min Charges:  OT General Charges $OT Visit: 1 Visit OT Evaluation $OT Eval Low Complexity: 1 Low  Marializ Ferrebee, OTR/L [place]  Office [phone] Pager: [place] 02/11/2021, 1:56 PM

## 2021-02-11 NOTE — ED Notes (Signed)
Gershon Cull NP, aware of patients fall, in with the pt now, head CT ordered

## 2021-02-11 NOTE — Progress Notes (Signed)
PROGRESS NOTE   Joe Hill  WFU:[id number] DOB: Dec 06, 1940 DOA: 02/04/2021 PCP: [name], MD  Brief Narrative:   81 y/o male Stage IV NSCLC/carboplatin, Alimta, Keytruda (last cycle 01/29/2021)-Dr. Mohammed HFrEF Partial vocal cord paralysis P A. fib/Eliquis COPD and smoking history Recent admission LLL ulcer 3 to 4 weeks-has been dressing this on his own  admission 12/29--01/25/2021 recurrent severe epistaxis after being admitted earlier that month-hospitalization complicated TCP Prior admission 12/18-12/19 LLL Pneumonia Prior to admission 12/2-12/8 multifocal pneumonia syncope A. fib RVR and significant pancytopenia  Readmit 02/06/2021 2 episodes syncope, 1 episode fell hit nose tip of lip--blood pressure 95/58 noted at triage received 500 cc fluids -Patient had complete loss of consciousness Lab =pancytopenia, hemoglobin 5.9, platelets 12,000,  imaging work-up = CT head CT spine negative for acute traumatic findings CT chest negative for PE stable LUL mass  Oncology consulted-Granix started Patient subsequently had a fall in the emergency room 02/11/2021 AM with no deficit  Hospital-Problem based course  Accidental fall at 0 500 this morning Patient coherent awake alert on exam with no focal neurological deficits as well as CT negative for any bleeding Syncope DDx severe pancytopenia, unlikely this is arrhythmia 2 episodes of fall prior to admission Transfused 2 units PRBC 1/17, 1 unit platelets 1/17 Transfuse 1 more units platelets 1/18 Will need physical therapy to evaluate for safety-patient states regardless he wants to go home Severe pancytopenia secondary to chemotherapy 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 fib RVR on admission-off Eliquis at this time despite CHADS2 score 4 given risk of bleed HF R EF with no acute decompensation and compensated at this time Decrease home 240 Cardizem-->180, try not  to aggressively control as this may cause hypotension titrate off drip when able Foot ulcer present on admission Xeroform gauze dressings in addition to saline--I examined the wound it is a irregular shaped wound 6 x 4 cm and Further follow-up as per wound care nursing   DVT prophylaxis: SCD Code Status: DNR Family Communication: None present at this time Disposition:  Status is: Observation  The patient will require care spanning > 2 midnights and should be moved to inpatient because: Thrombocytopenia medical instability      Consultants:  Oncology  Procedures: None  Antimicrobials:   Subjective: Awake alert coherent tolerating diet has been up 1-2 times subsequent to his fall with assistance and did not feel dizzy No chest pain no fever He tells me he really wants to go home I asked him about his wound-patient tells me that it used to be oozing--but he thinks it is getting better   Objective: Vitals:   02/11/21 0500 02/11/21 0545 02/11/21 0800 02/11/21 1000  BP: 121/75  101/63 101/88  Pulse: (!) 134 (!) 104 75 (!) 118  Resp: (!) 33 18 16 (!) 22  Temp:      TempSrc:      SpO2: 98% (!) 89% 95% 96%  Weight:      Height:        Intake/Output Summary (Last 24 hours) at 02/11/2021 1012 Last data filed at 02/11/2021 0118 Gross per 24 hour  Intake 1630 ml  Output --  Net 1630 ml   Filed Weights   02/11/2021 1455  Weight: 79 kg    Examination:  EOMI NCAT no focal deficit Asthenic frail white male no distress S1-S2 no murmur no rub no gallop Abdomen soft nontender no rebound no guarding Finger-nose-finger intact power 5/5  upper and lower extremities, sensory intact Wound on left lower extremity 4 x 6 cm, edges are clean and wound bed is clean in addition  Data Reviewed: personally reviewed   CBC    Component Value Date/Time   WBC 0.3 (LL) 02/11/2021 0500   RBC 2.31 (L) 02/11/2021 0500   HGB 7.5 (L) 02/11/2021 0500   HGB 8.8 (L) 02/05/2021 1509   HGB 15.7  11/04/2020 0840   HCT 22.0 (L) 02/11/2021 0500   HCT 44.5 11/04/2020 0840   PLT 15 (LL) 02/11/2021 0500   PLT 96 (L) 02/05/2021 1509   PLT 252 11/04/2020 0840   MCV 95.2 02/11/2021 0500   MCV 93 11/04/2020 0840   MCH 32.5 02/11/2021 0500   MCHC 34.1 02/11/2021 0500   RDW 20.7 (H) 02/11/2021 0500   RDW 12.1 11/04/2020 0840   LYMPHSABS 0.3 (L) 01/26/2021 1335   MONOABS 0.1 02/19/2021 1335   EOSABS 0.0 02/03/2021 1335   BASOSABS 0.0 02/16/2021 1335   CMP Latest Ref Rng & Units 02/11/2021 01/27/2021 02/05/2021  Glucose 70 - 99 mg/dL 115(H) 133(H) 115(H)  BUN 8 - 23 mg/dL 39(H) 35(H) 33(H)  Creatinine 0.61 - 1.24 mg/dL 1.18 1.14 1.13  Sodium 135 - 145 mmol/L 138 138 140  Potassium 3.5 - 5.1 mmol/L 4.1 3.7 3.7  Chloride 98 - 111 mmol/L 103 104 104  CO2 22 - 32 mmol/L 25 28 30   Calcium 8.9 - 10.3 mg/dL 8.5(L) 8.6(L) 9.2  Total Protein 6.5 - 8.1 g/dL 6.3(L) 6.2(L) 6.3(L)  Total Bilirubin 0.3 - 1.2 mg/dL 2.7(H) 1.2 1.0  Alkaline Phos 38 - 126 U/L 96 92 105  AST 15 - 41 U/L 45(H) 25 19  ALT 0 - 44 U/L 43 30 27     Radiology Studies: CT Head Wo Contrast  Result Date: 02/11/2021 CLINICAL DATA:  Head trauma.  Status post fall. EXAM: CT HEAD WITHOUT CONTRAST TECHNIQUE: Contiguous axial images were obtained from the base of the skull through the vertex without intravenous contrast. RADIATION DOSE REDUCTION: This exam was performed according to the departmental dose-optimization program which includes automated exposure control, adjustment of the mA and/or kV according to patient size and/or use of iterative reconstruction technique. COMPARISON:  02/09/2021 FINDINGS: Brain: No evidence of acute infarction, hemorrhage, hydrocephalus, extra-axial collection or mass lesion/mass effect. There is mild diffuse low-attenuation within the subcortical and periventricular white matter compatible with chronic microvascular disease. Prominence of the sulci and ventricles compatible with brain atrophy.  Vascular: No hyperdense vessel or unexpected calcification. Skull: Normal. Negative for fracture or focal lesion. Sinuses/Orbits: No acute finding. Other: None IMPRESSION: 1. No acute intracranial abnormalities. 2. 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 Electronically Signed   By: Kerby Moors M.D.   On: 02/11/2021 06:28   CT Head Wo Contrast  Result Date: 02/08/2021 CLINICAL DATA:  Syncopal episodes.  Fell. EXAM: CT HEAD WITHOUT CONTRAST CT CERVICAL SPINE WITHOUT CONTRAST TECHNIQUE: Multidetector CT imaging of the head and cervical spine was performed following the standard protocol without intravenous contrast. Multiplanar CT image reconstructions of the cervical spine were also generated. RADIATION DOSE REDUCTION: This exam was performed according to the departmental dose-optimization program which includes automated exposure control, adjustment of the mA and/or kV according to patient size and/or use of iterative reconstruction technique. COMPARISON:  12/26/2020 FINDINGS: CT HEAD FINDINGS Brain: Stable age related cerebral atrophy, ventriculomegaly and periventricular white matter disease. No extra-axial fluid collections are identified. No CT findings for acute hemispheric infarction or  intracranial hemorrhage. No mass lesions. The brainstem and cerebellum are normal. Vascular: Stable vascular calcifications. No aneurysm or hyperdense vessels. Skull: No skull fracture or bone lesions. Sinuses/Orbits: The paranasal sinuses and mastoid air cells are clear. The globes are intact. Other: No scalp lesions or scalp hematoma. CT CERVICAL SPINE FINDINGS Alignment: Normal Skull base and vertebrae: No acute fracture. No primary bone lesion or focal pathologic process. Soft tissues and spinal canal: No prevertebral fluid or swelling. No visible canal hematoma. Disc levels: Degenerative cervical spondylosis with multilevel disc disease and facet disease bulging discs osteophytic ridging but no  significant spinal or foraminal stenosis. Upper chest: No significant findings. Emphysematous changes and pulmonary scarring. Other: No neck mass or adenopathy. IMPRESSION: 1. Stable age related cerebral atrophy, ventriculomegaly and periventricular white matter disease. 2. No acute intracranial findings or skull fracture. 3. Degenerative cervical spondylosis but no acute cervical spine fracture. Electronically Signed   By: Marijo Sanes M.D.   On: 02/09/2021 14:44   CT Angio Chest PE W and/or Wo Contrast  Result Date: 01/26/2021 CLINICAL DATA:  Chest pain.  Elevated D-dimer. EXAM: CT ANGIOGRAPHY CHEST WITH CONTRAST TECHNIQUE: Multidetector CT imaging of the chest was performed using the standard protocol during bolus administration of intravenous contrast. Multiplanar CT image reconstructions and MIPs were obtained to evaluate the vascular anatomy. RADIATION DOSE REDUCTION: This exam was performed according to the departmental dose-optimization program which includes automated exposure control, adjustment of the mA and/or kV according to patient size and/or use of iterative reconstruction technique. CONTRAST:  5mL OMNIPAQUE IOHEXOL 300 MG/ML  SOLN COMPARISON:  12/27/2020 FINDINGS: Cardiovascular: The heart is normal in size. No pericardial effusion. Stable tortuosity, ectasia and calcification of the thoracic aorta. Stable three-vessel coronary artery calcifications. The pulmonary arterial tree is well opacified. No filling defects to suggest pulmonary embolism. Mediastinum/Nodes: No mediastinal or hilar mass or lymphadenopathy. Small scattered nodes are stable. The esophagus is grossly normal. Lungs/Pleura: Stable underlying emphysematous changes and pulmonary scarring. Stable medial left upper lobe lung mass measuring a maximum of 2.5 cm on image 47/10. Less pronounced soft tissue invading the mediastinum. Improving left lower lobe infiltrate. No pleural effusions. Upper Abdomen: Stable bilateral adrenal  gland metastasis. No definite hepatic lesions. Stable vascular calcifications. Musculoskeletal: No chest wall mass, supraclavicular or axillary adenopathy. The bony thorax is intact. Healing lower sternal fracture. Review of the MIP images confirms the above findings. IMPRESSION: 1. No CT findings for pulmonary embolism. 2. Stable tortuosity, ectasia and calcification of the thoracic aorta. 3. Stable three-vessel coronary artery calcifications. 4. Stable medial left upper lobe lung mass. 5. Improving left lower lobe infiltrate. 6. Stable bilateral adrenal gland metastasis. 7. Stable emphysematous changes and pulmonary scarring. Aortic Atherosclerosis (ICD10-I70.0) and Emphysema (ICD10-J43.9). Electronically Signed   By: Marijo Sanes M.D.   On: 02/21/2021 16:28   CT Cervical Spine Wo Contrast  Result Date: 02/19/2021 CLINICAL DATA:  Syncopal episodes.  Fell. EXAM: CT HEAD WITHOUT CONTRAST CT CERVICAL SPINE WITHOUT CONTRAST TECHNIQUE: Multidetector CT imaging of the head and cervical spine was performed following the standard protocol without intravenous contrast. Multiplanar CT image reconstructions of the cervical spine were also generated. RADIATION DOSE REDUCTION: This exam was performed according to the departmental dose-optimization program which includes automated exposure control, adjustment of the mA and/or kV according to patient size and/or use of iterative reconstruction technique. COMPARISON:  12/26/2020 FINDINGS: CT HEAD FINDINGS Brain: Stable age related cerebral atrophy, ventriculomegaly and periventricular white matter disease. No extra-axial fluid collections are identified.  No CT findings for acute hemispheric infarction or intracranial hemorrhage. No mass lesions. The brainstem and cerebellum are normal. Vascular: Stable vascular calcifications. No aneurysm or hyperdense vessels. Skull: No skull fracture or bone lesions. Sinuses/Orbits: The paranasal sinuses and mastoid air cells are clear.  The globes are intact. Other: No scalp lesions or scalp hematoma. CT CERVICAL SPINE FINDINGS Alignment: Normal Skull base and vertebrae: No acute fracture. No primary bone lesion or focal pathologic process. Soft tissues and spinal canal: No prevertebral fluid or swelling. No visible canal hematoma. Disc levels: Degenerative cervical spondylosis with multilevel disc disease and facet disease bulging discs osteophytic ridging but no significant spinal or foraminal stenosis. Upper chest: No significant findings. Emphysematous changes and pulmonary scarring. Other: No neck mass or adenopathy. IMPRESSION: 1. Stable age related cerebral atrophy, ventriculomegaly and periventricular white matter disease. 2. No acute intracranial findings or skull fracture. 3. Degenerative cervical spondylosis but no acute cervical spine fracture. Electronically Signed   By: Marijo Sanes M.D.   On: 02/09/2021 14:44   DG Chest Port 1 View  Result Date: 02/22/2021 CLINICAL DATA:  Palpitation. Near syncope. Fall. History of lung cancer. EXAM: PORTABLE CHEST 1 VIEW COMPARISON:  01/26/2021 FINDINGS: Heart size is normal. No signs of pleural effusion or edema. No superimposed airspace consolidation identified. The visualized osseous structures are unremarkable. IMPRESSION: No acute cardiopulmonary abnormalities. Electronically Signed   By: Kerby Moors M.D.   On: 02/13/2021 13:12   VAS [place] LOWER EXTREMITY VENOUS (DVT) (7a-7p)  Result Date: 02/15/2021  Lower Venous DVT Study Patient Name:  YAMATO KOPF  Date of Exam:   02/20/2021 Medical Rec #: [id number]       Accession #:    [id number] Date of Birth: 10-Sep-1940       Patient Gender: M Patient Age:   57 years Exam Location:  [facility] Procedure:      VAS [place] LOWER EXTREMITY VENOUS (DVT) Referring Phys: Collier Salina MESSICK --------------------------------------------------------------------------------  Indications: Edema.  Comparison Study: 01/01/2021 - RIGHT:                   -  There is no evidence of deep vein thrombosis in the lower                   extremity.                    - No cystic structure found in the popliteal fossa.                    LEFT:                   - There is no evidence of deep vein thrombosis in the lower                   extremity.                    - No cystic structure found in the popliteal fossa.                    01/19/2021 - RIGHT:                   - No evidence of common femoral vein obstruction.                    LEFT:                   -  There is no evidence of deep vein thrombosis in the lower                   extremity.                    - No cystic structure found in the popliteal fossa. Performing Technologist: Oliver Hum RVT  Examination Guidelines: A complete evaluation includes B-mode imaging, spectral Doppler, color Doppler, and power Doppler as needed of all accessible portions of each vessel. Bilateral testing is considered an integral part of a complete examination. Limited examinations for reoccurring indications may be performed as noted. The reflux portion of the exam is performed with the patient in reverse Trendelenburg.  +-----+---------------+---------+-----------+----------+--------------+  RIGHT Compressibility Phasicity Spontaneity Properties Thrombus Aging  +-----+---------------+---------+-----------+----------+--------------+  CFV   Full            Yes       Yes                                    +-----+---------------+---------+-----------+----------+--------------+   +---------+---------------+---------+-----------+----------+--------------+  LEFT      Compressibility Phasicity Spontaneity Properties Thrombus Aging  +---------+---------------+---------+-----------+----------+--------------+  CFV       Full            Yes       Yes                                    +---------+---------------+---------+-----------+----------+--------------+  SFJ       Full                                                              +---------+---------------+---------+-----------+----------+--------------+  FV Prox   Full                                                             +---------+---------------+---------+-----------+----------+--------------+  FV Mid    Full                                                             +---------+---------------+---------+-----------+----------+--------------+  FV Distal Full                                                             +---------+---------------+---------+-----------+----------+--------------+  PFV       Full                                                             +---------+---------------+---------+-----------+----------+--------------+  POP       Full            Yes       Yes                                    +---------+---------------+---------+-----------+----------+--------------+  PTV       Full                                                             +---------+---------------+---------+-----------+----------+--------------+  PERO      Full                                                             +---------+---------------+---------+-----------+----------+--------------+ Arterial waveforms detected in the popliteal, posterior tibial, and peroneal arteries appear multiphasic.    Summary: RIGHT: - No evidence of common femoral vein obstruction.  LEFT: - There is no evidence of deep vein thrombosis in the lower extremity.  - No cystic structure found in the popliteal fossa.  *See table(s) above for measurements and observations. Electronically signed by Deitra Mayo MD on 02/09/2021 at 3:23:31 PM.    Final      Scheduled Meds:  diltiazem  180 mg Oral Daily   folic acid  1 mg Oral Daily   lactose free nutrition  1 Container Oral BID BM   sodium chloride flush  3 mL Intravenous Q12H   Tbo-filgastrim (GRANIX) SQ  480 mcg Subcutaneous q1800   Continuous Infusions:     LOS: 0 days   Time spent: Gray, MD [facility] To  contact the attending provider between 7A-7P or the covering provider during after hours 7P-7A, please log into the web site [email or web address] and access using universal [place] password for that web site. If you do not have the password, please call the hospital operator.  02/11/2021, 10:12 AM

## 2021-02-11 NOTE — Progress Notes (Signed)
Oncology Discharge Planning Admission Note  [facility] Address: [address], [place], [place] [address] Hours of Operation:  8am - 5pm, Monday - Friday  Clinic Contact Information:  [phone]  Oncology Care Team: Medical Oncologist:  Dr. Julien Nordmann   Dr. Julien Nordmann is aware of this hospital admission dated 02/09/2021.  Patient was assessed at the bedside by Myrtha Mantis, NP.  The cancer center will follow Joe Hill inpatient care to assist with discharge planning as indicated by the oncologist.  We will reach out closer to discharge date to arrange follow up care.  Disclaimer:  This [place] note does not imply a formal consult request has been made by the admitting attending for this admission or there will be an inpatient consult completed by oncology.  Please request oncology consults as per standard process as indicated.

## 2021-02-11 NOTE — ED Notes (Signed)
Patient transported to 1439 by Davy Pique, RN. Receiving nurse states that running infusion, platelets were turned off. Rosemarie Ax, RN states that infusion was not turned off .

## 2021-02-12 ENCOUNTER — Inpatient Hospital Stay (HOSPITAL_COMMUNITY): Payer: Medicare Other

## 2021-02-12 ENCOUNTER — Other Ambulatory Visit: Payer: Self-pay | Admitting: Internal Medicine

## 2021-02-12 DIAGNOSIS — R579 Shock, unspecified: Secondary | ICD-10-CM

## 2021-02-12 DIAGNOSIS — I4819 Other persistent atrial fibrillation: Secondary | ICD-10-CM

## 2021-02-12 DIAGNOSIS — R55 Syncope and collapse: Secondary | ICD-10-CM | POA: Diagnosis not present

## 2021-02-12 LAB — COMPREHENSIVE METABOLIC PANEL
ALT: 40 U/L (ref 0–44)
AST: 40 U/L (ref 15–41)
Albumin: 3.3 g/dL — ABNORMAL LOW (ref 3.5–5.0)
Alkaline Phosphatase: 87 U/L (ref 38–126)
Anion gap: 11 (ref 5–15)
BUN: 47 mg/dL — ABNORMAL HIGH (ref 8–23)
CO2: 25 mmol/L (ref 22–32)
Calcium: 8.4 mg/dL — ABNORMAL LOW (ref 8.9–10.3)
Chloride: 104 mmol/L (ref 98–111)
Creatinine, Ser: 1.5 mg/dL — ABNORMAL HIGH (ref 0.61–1.24)
GFR, Estimated: 47 mL/min — ABNORMAL LOW (ref >60)
Glucose, Bld: 116 mg/dL — ABNORMAL HIGH (ref 70–99)
Potassium: 3.3 mmol/L — ABNORMAL LOW (ref 3.5–5.1)
Sodium: 140 mmol/L (ref 135–145)
Total Bilirubin: 1.7 mg/dL — ABNORMAL HIGH (ref 0.3–1.2)
Total Protein: 6.1 g/dL — ABNORMAL LOW (ref 6.5–8.1)

## 2021-02-12 LAB — BPAM PLATELET PHERESIS
Blood Product Expiration Date: 202301192359
Blood Product Expiration Date: 202301212359
ISSUE DATE / TIME: 202301180146
ISSUE DATE / TIME: 202301181141
Unit Type and Rh: 6200
Unit Type and Rh: 5100

## 2021-02-12 LAB — CBC WITH DIFFERENTIAL/PLATELET
Abs Immature Granulocytes: 0 10*3/uL (ref 0.00–0.07)
Basophils Absolute: 0 10*3/uL (ref 0.0–0.1)
Basophils Relative: 0 %
Eosinophils Absolute: 0 10*3/uL (ref 0.0–0.5)
Eosinophils Relative: 0 %
HCT: 20.5 % — ABNORMAL LOW (ref 39.0–52.0)
Hemoglobin: 7.1 g/dL — ABNORMAL LOW (ref 13.0–17.0)
Immature Granulocytes: 0 %
Lymphocytes Relative: 20 %
Lymphs Abs: 0.1 10*3/uL — ABNORMAL LOW (ref 0.7–4.0)
MCH: 32.7 pg (ref 26.0–34.0)
MCHC: 34.6 g/dL (ref 30.0–36.0)
MCV: 94.5 fL (ref 80.0–100.0)
Monocytes Absolute: 0.1 10*3/uL (ref 0.1–1.0)
Monocytes Relative: 31 %
Neutro Abs: 0.2 10*3/uL — CL (ref 1.7–7.7)
Neutrophils Relative %: 49 %
Platelets: 20 10*3/uL — CL (ref 150–400)
RBC: 2.17 MIL/uL — ABNORMAL LOW (ref 4.22–5.81)
RDW: 20.9 % — ABNORMAL HIGH (ref 11.5–15.5)
WBC: 0.5 10*3/uL — CL (ref 4.0–10.5)
nRBC: 0 % (ref 0.0–0.2)

## 2021-02-12 LAB — PREPARE PLATELET PHERESIS
Unit division: 0
Unit division: 0

## 2021-02-12 LAB — LACTIC ACID, PLASMA
Lactic Acid, Venous: 5.6 mmol/L (ref 0.5–1.9)
Lactic Acid, Venous: 7.9 mmol/L (ref 0.5–1.9)

## 2021-02-12 LAB — MAGNESIUM: Magnesium: 1.5 mg/dL — ABNORMAL LOW (ref 1.7–2.4)

## 2021-02-12 LAB — MRSA NEXT GEN BY PCR, NASAL: MRSA by PCR Next Gen: NOT DETECTED

## 2021-02-12 MED ORDER — AZITHROMYCIN 250 MG PO TABS: 500.0000 mg | ORAL_TABLET | Freq: Every day | ORAL | Status: DC

## 2021-02-12 MED ORDER — SODIUM CHLORIDE 0.9 % IV BOLUS
2000.0000 mL | Freq: Once | INTRAVENOUS | Status: AC
  Administered 2021-02-12: 2000 mL via INTRAVENOUS

## 2021-02-12 MED ORDER — NOREPINEPHRINE 4 MG/250ML-% IV SOLN: 0.0000 ug/min | INTRAVENOUS | Status: DC

## 2021-02-12 MED ORDER — LACTATED RINGERS IV BOLUS
250.0000 mL | Freq: Once | INTRAVENOUS | Status: AC
  Administered 2021-02-12: 250 mL via INTRAVENOUS

## 2021-02-12 MED ORDER — MORPHINE SULFATE (PF) 2 MG/ML IV SOLN
2.0000 mg | Freq: Once | INTRAVENOUS | Status: AC
  Administered 2021-02-12: 2 mg via INTRAVENOUS
  Filled 2021-02-12: qty 1

## 2021-02-12 MED ORDER — SODIUM CHLORIDE 0.9 % IV SOLN: 250.0000 mL | INTRAVENOUS | Status: DC

## 2021-02-12 MED ORDER — ORAL CARE MOUTH RINSE: 15.0000 mL | Freq: Two times a day (BID) | OROMUCOSAL | Status: DC

## 2021-02-12 MED ORDER — HYDROMORPHONE HCL 2 MG PO TABS: 1.0000 mg | ORAL_TABLET | ORAL | Status: DC | PRN

## 2021-02-12 MED ORDER — HALOPERIDOL 1 MG PO TABS: 0.5000 mg | ORAL_TABLET | ORAL | Status: DC | PRN

## 2021-02-12 MED ORDER — AMIODARONE HCL IN DEXTROSE 360-4.14 MG/200ML-% IV SOLN
30.0000 mg/h | INTRAVENOUS | Status: DC
  Filled 2021-02-12: qty 200

## 2021-02-12 MED ORDER — NOREPINEPHRINE 4 MG/250ML-% IV SOLN: 2.0000 ug/min | INTRAVENOUS | Status: DC

## 2021-02-12 MED ORDER — DIGOXIN 0.25 MG/ML IJ SOLN
0.1250 mg | Freq: Once | INTRAMUSCULAR | Status: AC
  Administered 2021-02-12: 0.125 mg via INTRAVENOUS
  Filled 2021-02-12: qty 2

## 2021-02-12 MED ORDER — AMIODARONE HCL IN DEXTROSE 360-4.14 MG/200ML-% IV SOLN
60.0000 mg/h | INTRAVENOUS | Status: DC
  Administered 2021-02-12: 60 mg/h via INTRAVENOUS

## 2021-02-12 MED ORDER — DOXYCYCLINE HYCLATE 100 MG PO TABS: 100.0000 mg | ORAL_TABLET | Freq: Two times a day (BID) | ORAL | Status: DC

## 2021-02-12 MED ORDER — CHLORHEXIDINE GLUCONATE 0.12 % MT SOLN: 15.0000 mL | Freq: Two times a day (BID) | OROMUCOSAL | Status: DC

## 2021-02-12 MED ORDER — DILTIAZEM HCL 30 MG PO TABS: 30.0000 mg | ORAL_TABLET | Freq: Four times a day (QID) | ORAL | Status: DC

## 2021-02-12 MED ORDER — MORPHINE SULFATE (PF) 2 MG/ML IV SOLN
2.0000 mg | INTRAVENOUS | Status: DC | PRN
  Administered 2021-02-12: 2 mg via INTRAVENOUS
  Filled 2021-02-12: qty 1

## 2021-02-12 MED ORDER — HYDROMORPHONE HCL 1 MG/ML IJ SOLN
0.5000 mg | INTRAMUSCULAR | Status: DC | PRN
  Administered 2021-02-12 (×2): 0.5 mg via INTRAVENOUS
  Filled 2021-02-12 (×2): qty 1

## 2021-02-12 MED ORDER — MORPHINE 100MG IN NS 100ML (1MG/ML) PREMIX INFUSION
2.0000 mg/h | INTRAVENOUS | Status: DC
  Filled 2021-02-12: qty 100

## 2021-02-12 MED ORDER — HALOPERIDOL LACTATE 2 MG/ML PO CONC
0.5000 mg | ORAL | Status: DC | PRN
  Filled 2021-02-12: qty 0.3

## 2021-02-12 MED ORDER — SODIUM CHLORIDE 0.9 % IV SOLN
2.0000 g | Freq: Two times a day (BID) | INTRAVENOUS | Status: DC
  Administered 2021-02-12: 2 g via INTRAVENOUS
  Filled 2021-02-12 (×2): qty 2

## 2021-02-12 MED ORDER — AMIODARONE LOAD VIA INFUSION
150.0000 mg | Freq: Once | INTRAVENOUS | Status: AC
  Administered 2021-02-12: 150 mg via INTRAVENOUS
  Filled 2021-02-12: qty 83.34

## 2021-02-12 MED ORDER — SODIUM CHLORIDE 0.9 % IV BOLUS: 500.0000 mL | Freq: Once | INTRAVENOUS | Status: DC

## 2021-02-12 MED ORDER — SODIUM CHLORIDE 0.9 % IV BOLUS: 1000.0000 mL | Freq: Once | INTRAVENOUS | Status: DC

## 2021-02-12 MED ORDER — POTASSIUM CHLORIDE CRYS ER 20 MEQ PO TBCR
40.0000 meq | EXTENDED_RELEASE_TABLET | Freq: Once | ORAL | Status: AC
  Administered 2021-02-12: 40 meq via ORAL
  Filled 2021-02-12: qty 2

## 2021-02-12 MED ORDER — POLYVINYL ALCOHOL 1.4 % OP SOLN
1.0000 [drp] | Freq: Four times a day (QID) | OPHTHALMIC | Status: DC | PRN
  Filled 2021-02-12: qty 15

## 2021-02-12 MED ORDER — BIOTENE DRY MOUTH MT LIQD: 15.0000 mL | OROMUCOSAL | Status: DC | PRN

## 2021-02-12 MED ORDER — VANCOMYCIN HCL 1000 MG/200ML IV SOLN
1000.0000 mg | Freq: Once | INTRAVENOUS | Status: AC
  Administered 2021-02-12: 1000 mg via INTRAVENOUS
  Filled 2021-02-12: qty 200

## 2021-02-12 MED ORDER — SODIUM CHLORIDE 0.9 % IV SOLN: INTRAVENOUS | Status: DC

## 2021-02-12 MED ORDER — NOREPINEPHRINE 4 MG/250ML-% IV SOLN
INTRAVENOUS | Status: AC
  Administered 2021-02-12: 2 ug/min via INTRAVENOUS
  Filled 2021-02-12: qty 250

## 2021-02-12 MED ORDER — HALOPERIDOL LACTATE 5 MG/ML IJ SOLN: 0.5000 mg | INTRAMUSCULAR | Status: DC | PRN

## 2021-02-12 MED ORDER — VANCOMYCIN HCL 1000 MG/200ML IV SOLN: 1000.0000 mg | INTRAVENOUS | Status: DC

## 2021-02-12 MED ORDER — COLLAGENASE 250 UNIT/GM EX OINT
TOPICAL_OINTMENT | Freq: Every day | CUTANEOUS | Status: DC
  Filled 2021-02-12: qty 30

## 2021-02-12 MED ORDER — MAGNESIUM SULFATE 2 GM/50ML IV SOLN
2.0000 g | Freq: Once | INTRAVENOUS | Status: AC
  Administered 2021-02-12: 2 g via INTRAVENOUS
  Filled 2021-02-12: qty 50

## 2021-02-12 MED ORDER — CHLORHEXIDINE GLUCONATE CLOTH 2 % EX PADS
6.0000 | MEDICATED_PAD | Freq: Every day | CUTANEOUS | Status: DC
  Administered 2021-02-12: 6 via TOPICAL

## 2021-02-13 LAB — BLOOD CULTURE ID PANEL (REFLEXED) - BCID2

## 2021-02-15 LAB — CULTURE, BLOOD (ROUTINE X 2): Special Requests: ADEQUATE

## 2021-02-18 ENCOUNTER — Inpatient Hospital Stay: Payer: Medicare Other

## 2021-02-18 ENCOUNTER — Inpatient Hospital Stay: Payer: Medicare Other | Admitting: Internal Medicine

## 2021-02-25 ENCOUNTER — Inpatient Hospital Stay: Payer: Medicare Other

## 2021-02-25 NOTE — Progress Notes (Signed)
Per RN Lonn Georgia pt on comfort care. Charge RN Darrick Meigs stated to [place] to not have a PIV placed.  Kayla to have MD place note or order since on Vasopressors regarding not placing a PIV u/s.

## 2021-02-25 NOTE — Progress Notes (Signed)
Pharmacy Antibiotic Note  Joe Hill is a 81 y.o. male admitted on 02/23/2021 with syncope. On D2 of admission, noted to have fevers and hypotension in setting of neutropenia. Pharmacy has been consulted for vancomycin and cefepime dosing. SCr trending up.  Plan: Vancomycin 1000 mg IV q24 hr (est AUC 526 based on SCr 1.5; Vd 0.72) Measure vancomycin AUC at steady state as indicated SCr daily while on vanc - monitor closely with rising SCr Cefepime 2 g IV q12 hr   Height: 5\' 10"  (177.8 cm) Weight: 71.3 kg (157 lb 3.2 oz) IBW/kg (Calculated) : 73  Temp (24hrs), Avg:98.8 F (37.1 C), Min:97.6 F (36.4 C), Max:101.1 F (38.4 C)  Recent Labs  Lab 02/05/21 1509 01/26/2021 1335 02/11/21 0500 February 15, 2021 0310  WBC 1.5* 0.7* 0.3* 0.5*  CREATININE 1.13 1.14 1.18 1.50*    Estimated Creatinine Clearance: 39.6 mL/min (A) (by C-G formula based on SCr of 1.5 mg/dL (H)).    Allergies  Allergen Reactions   Penicillins Hives    Childhood reaction Tolerates cephalosporins (only 3rd generation and higher documented)    Antimicrobials this admission: 1/19 vancomycin >>  1/19 cefepime >>   Dose adjustments this admission: N/a  Microbiology results: 1/19 BCx: sent 1/17 Covid/Flu: neg  Thank you for allowing pharmacy to be a part of this patients care.  Sanah Kraska A 02/15/21 12:37 PM

## 2021-02-25 NOTE — Progress Notes (Signed)
° ° °  OVERNIGHT PROGRESS REPORT Notified by RN that family has arrived and have confirmed Comfort Measures Only for this patient.  Family has verified this in person and patient is now in the EOL,CMO pathway for comfort measures only as was noted in prior physician notation(s).    Gershon Cull MSNA MSN ACNPC-AG Acute Care Nurse Practitioner [place]

## 2021-02-25 NOTE — Significant Event (Signed)
Rapid Response Event Note   Reason for Call :  Tachycardia, fever, restlessness progressively worse overnight.  Initial Focused Assessment:  Fever trending up since start of shift. Patient reports pain/aching all over, and anxious. Lung sounds diminished with rapid heart rate from 120-150's. Warm to touch. Repeat temperature shows slight improvement since initial call.  Interventions:  Tylenol previously administered. Ice packs applied to neck, groin, armpits, behind knees. 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  Plan of Care:  Continue monitoring vital signs and overall condition per MEWS protocol. To remain at current level of care. RN to call if no improvement in HR, fever in next 1-2 hours.  Event Summary:   MD Notified: Gershon Cull, NP Call Time: 0500 Arrival Time: 0515 End Time: 0530  Selinda Michaels, RN

## 2021-02-25 NOTE — Consult Note (Signed)
WOC Nurse Consult Note: Patient receiving care in [place]. Reason for Consult: left foot and left arms wounds Wound type: trauma injuries from falls. Pressure Injury POA: NA Measurement: dorsum of left foot wound measures 2.5 cm x 5 cm and is 100% light colored tan slough. The wounds on the dorsum of the left hand and LFA are resolving skin tears. Wound bed: Drainage (amount, consistency, odor) bloody from LFA, none from left foot Periwound: LFA is bruised, normal color for left foot Dressing procedure/placement/frequency: Place vaseline gauze over the wounds on the dorsum of the left hand and LFA, cover with foam dressings. Change every 3 days. This is to begin 02/13/21. Daily application of santyl and saline moistened gauze to left foot.  Thank you for the consult.  Discussed plan of care with the patient.  [place] nurse will not follow at this time.  Please re-consult the [place] team if needed.  Val Riles, RN, MSN, CWOCN, CNS-BC, pager [phone]

## 2021-02-25 NOTE — Progress Notes (Signed)
Explain to pt he will be transferred to ICU for closer monitoring and verbalized understanding. Asked the pt if he wants his son to be notified of the transfer but pt declined and prefer not to call his son.

## 2021-02-25 NOTE — Progress Notes (Signed)
° ° °  OVERNIGHT PROGRESS REPORT  Notified by RN that patient has expired at  2154  Patient was DNR/comfort care.  2 RN verified.  Family was available to RN.   Gershon Cull MSNA ACNPC-AG Acute Care Nurse Practitioner [place]

## 2021-02-25 NOTE — TOC Initial Note (Signed)
Transition of Care [facility]) - Initial/Assessment Note    Patient Details  Name: Joe Hill MRN: [id number] Date of Birth: 04-28-1940  Transition of Care [facility]) CM/SW Contact:    Leeroy Cha, RN Phone Number: 2021/03/07, 8:34 AM  Clinical Narrative:                 Temp this am 101.1,wbc=0.5, platelets=20, rec'ing 2 units of ppp.  Transition of Care [facility]) Screening Note   Patient Details  Name: Joe Hill Date of Birth: [birth year]   Transition of Care [facility]) CM/SW Contact:    Leeroy Cha, RN Phone Number: 03/07/2021, 8:35 AM    Transition of Care Department [facility]) has reviewed patient and no TOC needs have been identified at this time. We will continue to monitor patient advancement through interdisciplinary progression rounds. If new patient transition needs arise, please place a TOC consult.    Expected Discharge Plan: Home/Self Care Barriers to Discharge: Continued Medical Work up   Patient Goals and CMS Choice Patient states their goals for this hospitalization and ongoing recovery are:: to return home CMS Medicare.gov Compare Post Acute Care list provided to:: Patient    Expected Discharge Plan and Services Expected Discharge Plan: Home/Self Care   Discharge Planning Services: CM Consult   Living arrangements for the past 2 months: Single Family Home                                      Prior Living Arrangements/Services Living arrangements for the past 2 months: Single Family Home Lives with:: Self Patient language and need for interpreter reviewed:: Yes Do you feel safe going back to the place where you live?: Yes            Criminal Activity/Legal Involvement Pertinent to Current Situation/Hospitalization: No - Comment as needed  Activities of Daily Living Home Assistive Devices/Equipment: None ADL Screening (condition at time of admission) Patient's cognitive ability adequate to safely complete daily activities?: Yes Is the  patient deaf or have difficulty hearing?: No Does the patient have difficulty seeing, even when wearing glasses/contacts?: No Does the patient have difficulty concentrating, remembering, or making decisions?: No Patient able to express need for assistance with ADLs?: Yes (whispers when he talks) Does the patient have difficulty dressing or bathing?: No Independently performs ADLs?: Yes (appropriate for developmental age) Does the patient have difficulty walking or climbing stairs?: No Weakness of Legs: None Weakness of Arms/Hands: None  Permission Sought/Granted                  Emotional Assessment Appearance:: Appears stated age Attitude/Demeanor/Rapport: Engaged Affect (typically observed): Calm Orientation: : Oriented to Place, Oriented to Self, Oriented to  Time, Oriented to Situation Alcohol / Substance Use: Not Applicable Psych Involvement: No (comment)  Admission diagnosis:  Syncope and collapse [R55] Syncope, unspecified syncope type [R55] Other drug-induced neutropenia ([place]) [D70.2] Anemia, unspecified type [D64.9] Pancytopenia (HCC) [F79.024] Patient Active Problem List   Diagnosis Date Noted   Persistent atrial fibrillation with RVR ([place]) 02/21/2021   DNR (do not resuscitate) 09/73/5329   Chronic systolic CHF (congestive heart failure) ([place]) 02/11/2021   Foot ulcer, left, limited to breakdown of skin ([place]) 02/09/2021   Leucopenia    Anemia    Persistent atrial fibrillation ([place]) 01/30/2021   Thrombocytopenia ([place]) secondary to chemotherapy, low Plt likely cause for refractory epistaxis 01/23/2021   Epistaxis d/t thrombocytopenia  d/t chemotherapy treating lung cancer  01/23/2021   Hypotension 01/02/2021   Chemotherapy-induced neutropenia (HCC)    Syncope and collapse 12/26/2020   Pancytopenia ([place]) 12/26/2020   Hypokalemia 12/26/2020   Borderline prolonged QT interval 12/26/2020   Atrial fibrillation with RVR ([place]) 12/26/2020   Encounter for antineoplastic  chemotherapy 11/18/2020   Encounter for antineoplastic immunotherapy 11/18/2020   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[place]) 11/05/2020   Aortic atherosclerosis ([place]) 10/28/2020   Dyslipidemia 10/23/2016   Abnormal CT scan, chest 09/07/2010   EMPHYSEMA 10/16/2009   GERD 09/09/2009   Hyperglycemia 08/12/2009   Essential hypertension 08/04/2007   TOBACCO USE 01/30/2007   PROSTATE CANCER, 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/11/2006   PCP:  [name], MD Pharmacy:   [id number] - [place], [place] [address] [place] [address] Phone: [phone] Fax: [phone]     Social Determinants of Health ([place]) Interventions    Readmission Risk Interventions No flowsheet data found.

## 2021-02-25 NOTE — Progress Notes (Signed)
°   03-12-2021 0400  Assess: MEWS Score  Temp (!) 101.1 F (38.4 C)  BP (!) 94/57  ECG Heart Rate (!) 150  Resp (!) 26  Assess: MEWS Score  MEWS Temp 1  MEWS Systolic 1  MEWS Pulse 3  MEWS RR 2  MEWS LOC 0  MEWS Score 7  MEWS Score Color Red  Assess: if the MEWS score is Yellow or Red  Were vital signs taken at a resting state? Yes  Focused Assessment No change from prior assessment  Does the patient meet 2 or more of the SIRS criteria? Yes  Does the patient have a confirmed or suspected source of infection? No  MEWS guidelines implemented *See Row Information* Yes  Treat  MEWS Interventions Administered scheduled meds/treatments  Pain Scale 0-10  Pain Type Acute pain  Pain Location Generalized  Pain Descriptors / Indicators Aching  Pain Frequency Rarely  Pain Onset On-going  Pain Intervention(s) Medication (See eMAR)  Multiple Pain Sites No  Complains of Agitation  Take Vital Signs  Increase Vital Sign Frequency  Red: Q 1hr X 4 then Q 4hr X 4, if remains red, continue Q 4hrs  Escalate  MEWS: Escalate Red: discuss with charge nurse/RN and provider, consider discussing with RRT  Notify: Charge Nurse/RN  Name of Charge Nurse/RN Notified Myriam Jacobson  Date Charge Nurse/RN Notified 12-Mar-2021  Time Charge Nurse/RN Notified 0400  Notify: Provider  Provider Name/Title J. Olena Heckle  Date Provider Notified March 12, 2021  Time Provider Notified [phone]  Notification Type Page  Notification Reason Change in status  Provider response At bedside  Date of Provider Response 12-Mar-2021  Time of Provider Response [id number]  Notify: Rapid Response  Name of Rapid Response RN Notified Marliss Czar  Date Rapid Response Notified March 12, 2021  Time Rapid Response Notified 0400  Document  Patient Outcome Stabilized after interventions  Assess: SIRS CRITERIA  SIRS Temperature  1  SIRS Pulse 1  SIRS Respirations  1  SIRS WBC 1  SIRS Score Sum  4   Pt seen by NP and rapid response RN

## 2021-02-25 NOTE — Consult Note (Signed)
Cardiology Consult:   Patient ID: Joe Hill MRN: [id number]; DOB: 1940-11-03   Admission date: 02/09/2021  PCP:  [name], MD   [facility] Providers Cardiologist:  None       Chief Complaint:  AF RVR  Patient Profile:   Joe Hill is a 81 y.o. 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  History of Present Illness:   Joe Hill Has unfortunately had a significant decline.  Last seen by EP 01/30/21 and was doing well.  He received his chemotherapy and this have been worse: presented with near syncope, accompanied with weakness while walking (two episodes leading to admission).  Did not have chest pain, palpitations, or SOB, and had stable LE swelling.  Through eval found to have new pancytopenia. Heart rates were increased. Given transfusion and  WBC stimulation Hgb increased to 7.  On assessment he is hoarse and very tachypnic.  He has now given [place] permission to discussed his care with family (Dr. Verlon Au has updated family).  He appears to be bleeding from his left arm. He is  still DNR/DNI.   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   Pulmonary embolism ([place]) 12/26/2020   TOBACCO USE 01/30/2007    Past Surgical History:  Procedure Laterality Date   APPENDECTOMY     CATARACT EXTRACTION     PROSTATE SURGERY     prostatectomy   VIDEO BRONCHOSCOPY WITH ENDOBRONCHIAL NAVIGATION N/A 10/30/2020   Procedure: VIDEO BRONCHOSCOPY WITH ENDOBRONCHIAL NAVIGATION;  Surgeon: Melrose Nakayama, MD;  Location: [place];   Service: Thoracic;  Laterality: N/A;   VIDEO BRONCHOSCOPY WITH ENDOBRONCHIAL ULTRASOUND N/A 10/30/2020   Procedure: VIDEO BRONCHOSCOPY WITH ENDOBRONCHIAL ULTRASOUND;  Surgeon: Melrose Nakayama, MD;  Location: [facility];  Service: Thoracic;  Laterality: N/A;     Medications Prior to Admission: Prior to Admission medications   Medication Sig Start Date End Date Taking? Authorizing Provider  albuterol (VENTOLIN HFA) 108 (90 Base) MCG/ACT inhaler TAKE 2 PUFFS BY MOUTH EVERY 6 HOURS AS NEEDED FOR WHEEZE OR SHORTNESS OF BREATH Patient taking differently: 2 puffs every 6 (six) hours as needed for wheezing or shortness of breath. 01/07/21  Yes [name], MD  dextromethorphan-guaiFENesin [facility] DM) 30-600 MG 12hr tablet Take 1 tablet by mouth 2 (two) times daily as needed for cough.   Yes [provider]  feeding supplement (BOOST HIGH PROTEIN) LIQD Take 1 Container by mouth 2 (two) times daily as needed (nutrition).   Yes [provider]  ibuprofen (ADVIL) 400 MG tablet Take 400 mg by mouth every 6 (six) hours as needed for headache.   Yes [provider]  pantoprazole (PROTONIX) 40 MG tablet Take 1 tablet (40 mg total) by mouth daily. Patient taking differently: Take 40 mg by mouth daily as needed (for acid reflux). 01/02/21 04/02/21 Yes [place] Eric J, DO  triamcinolone cream (KENALOG) 0.5 % Apply topically 4 (four) times daily as needed (irritation to skin lower extremity). 01/25/21  Yes [name], DO  clindamycin (CLEOCIN)  300 MG capsule Take 1 capsule (300 mg total) by mouth 4 (four) times daily. X 7 days Patient not taking: Reported on 02/23/2021 01/27/21   Veryl Speak, MD  diltiazem (CARDIZEM CD) 240 MG 24 hr capsule Take 1 capsule (240 mg total) by mouth daily. Patient not taking: Reported on 02/05/2021 01/25/21   Nicole Kindred A, DO  folic acid (FOLVITE) 1 MG tablet TAKE 1 TABLET BY MOUTH EVERY DAY 03/06/21   Curt Bears, MD  prochlorperazine  (COMPAZINE) 10 MG tablet Take 1 tablet (10 mg total) by mouth every 6 (six) hours as needed for nausea or vomiting. Patient not taking: Reported on 02/08/2021 11/18/20   Curt Bears, MD     Allergies:    Allergies  Allergen Reactions   Penicillins Hives    Childhood reaction Tolerates cephalosporins (only 3rd generation and higher documented)    Social History:   Social History   Socioeconomic History   Marital status: Single    Spouse name: Not on file   Number of children: Not on file   Years of education: Not on file   Highest education level: Not on file  Occupational History   Not on file  Tobacco Use   Smoking status: Every Day    Packs/day: 0.25    Types: Cigarettes   Smokeless tobacco: Never  Vaping Use   Vaping Use: Never used  Substance and Sexual Activity   Alcohol use: No    Alcohol/week: 0.0 standard drinks   Drug use: No   Sexual activity: Not on file  Other Topics Concern   Not on file  Social History Narrative   Divorced. 2 sons. 5 grandkids- 2 in college, 3 in hs in 2021.       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       HObbies: woodworking, taking care of yard, very active at work   Social Determinants of Radio broadcast assistant Strain: Not on file  Food Insecurity: Not on file  Transportation Needs: Not on file  Physical Activity: Not on file  Stress: Not on file  Social Connections: Not on file  Intimate Partner Violence: Not on file    Family History:   The patient's family history includes Arthritis in his maternal aunt; Cancer in his father and sister; Hyperlipidemia in his mother.    ROS:  Please see the history of present illness.  All other ROS reviewed and negative.     Physical Exam/Data:   Vitals:   March 06, 2021 1119 March 06, 2021 1249 March 06, 2021 1434 03-06-21 1449  BP: (!) 87/66 (!) 97/49 91/61 94/61   Pulse: (!) 136 (!) 128  (!) 141  Resp: (!) 24    (!) 34  Temp: 98 F (36.7 C)   99.5 F (37.5 C)  TempSrc: Oral   Oral  SpO2: 100%   90%  Weight:      Height:        Intake/Output Summary (Last 24 hours) at 2021-03-06 1504 Last data filed at March 06, 2021 1245 Gross per 24 hour  Intake 971.98 ml  Output 325 ml  Net 646.98 ml   Last 3 Weights 02/11/2021 02/23/2021 01/30/2021  Weight (lbs) 157 lb 3.2 oz 174 lb 2.6 oz 174 lb 3.2 oz  Weight (kg) 71.305 kg 79 kg 79.017 kg     Body mass index is 22.56 kg/m.   Gen: Acute distress, Cachetic male   Neck: No JVD Cardiac: No  Rubs or Gallops, IRIR tachycardia Respiratory: rapid shallow breathing and increased WOB, tachypnea GI: Soft, nontender, non-distended  MS: No  edema; patient is bleeding from L AC site Integument: Skin feels warm Neuro:  At time of evaluation, is oriented to self, place, and situation Psych: Anxious but appropriate affect  EKG:  EKG was not performed Tele shows AF RVR rates 130s now  Laboratory Data:  High Sensitivity Troponin:   Recent Labs  Lab 01/16/21 0018 01/16/21 0232 01/18/21 0520 02/24/2021 1335 02/16/2021 1522  TROPONINIHS 8 8 8 10 10       Chemistry Recent Labs  Lab 02/11/21 0500 03/05/21 0310  NA 138 140  K 4.1 3.3*  CL 103 104  CO2 25 25  GLUCOSE 115* 116*  BUN 39* 47*  CREATININE 1.18 1.50*  CALCIUM 8.5* 8.4*  MG 1.7 1.5*  GFRNONAA >60 47*  ANIONGAP 10 11    Recent Labs  Lab 02/11/21 0500 Mar 05, 2021 0310  PROT 6.3* 6.1*  ALBUMIN 3.3* 3.3*  AST 45* 40  ALT 43 40  ALKPHOS 96 87  BILITOT 2.7* 1.7*   Lipids No results for input(s): CHOL, TRIG, HDL, LABVLDL, LDLCALC, CHOLHDL in the last 168 hours. Hematology Recent Labs  Lab 02/11/21 0500 03/05/21 0310  WBC 0.3* 0.5*  RBC 2.31* 2.17*  HGB 7.5* 7.1*  HCT 22.0* 20.5*  MCV 95.2 94.5  MCH 32.5 32.7  MCHC 34.1 34.6  RDW 20.7* 20.9*  PLT 15* 20*   Thyroid No results for input(s): TSH, FREET4 in the last 168 hours. BNPNo results for input(s): BNP, PROBNP in the last 168  hours.  DDimer  Recent Labs  Lab 02/23/2021 1339  DDIMER 1.76*     Radiology/Studies:  CT ABDOMEN PELVIS WO CONTRAST  Result Date: 05-Mar-2021 CLINICAL DATA:  Retroperitoneal hematoma, known or suspected; low back pain--low platelets EXAM: CT ABDOMEN AND PELVIS WITHOUT CONTRAST TECHNIQUE: Multidetector CT imaging of the abdomen and pelvis was performed following the standard protocol without IV contrast. RADIATION DOSE REDUCTION: This exam was performed according to the departmental dose-optimization program which includes automated exposure control, adjustment of the mA and/or kV according to patient size and/or use of iterative reconstruction technique. COMPARISON:  2015, chest CT 02/24/2021 FINDINGS: Lower chest: Similar appearance of emphysema and interstitial changes. Increased patchy atelectasis/consolidation at the lung bases. Hepatobiliary: No focal liver abnormality. Cholelithiasis. No biliary dilatation. Pancreas: Unremarkable. Spleen: Unremarkable. Adrenals/Urinary Tract: Unchanged nodular thickening of the adrenals. No hydronephrosis. Increased density within the bladder. Stomach/Bowel: Stomach is within normal limits. Bowel is normal in caliber. Vascular/Lymphatic: Atherosclerosis.  No enlarged nodes. Reproductive: Unremarkable. Other: Small volume simple ascites. Musculoskeletal: Degenerative changes of the included spine. Degenerative changes of the hips. No acute abnormality. IMPRESSION: No retroperitoneal hematoma. Increased density of the bladder lumen may reflect residual contrast from 02/22/2021 injection or proteinaceous material including hemorrhage. Increased patchy atelectasis/consolidation at the lung bases. Small volume simple ascites. Cholelithiasis. Electronically Signed   By: Macy Mis M.D.   On: 2021-03-05 11:10   DG CHEST PORT 1 VIEW  Result Date: 2021-03-05 CLINICAL DATA:  Pneumonia follow-up. EXAM: PORTABLE CHEST 1 VIEW COMPARISON:  CT a chest in chest x-ray dated  February 10, 2021. FINDINGS: The heart size and mediastinal contours are within normal limits. Diffuse reticulonodular interstitial opacities, new since chest x-ray from 2 days ago. No pneumothorax or large pleural effusion. No acute osseous abnormality. IMPRESSION: 1. Diffuse reticulonodular interstitial opacities, new since chest x-ray from 2 days ago, concerning for atypical infection. Electronically Signed  By: Titus Dubin M.D.   On: 2021-02-26 12:58     Assessment and Plan:   Persistent (Long standing) Atrial Fibrillation Panctyopenia Shock Lactic Acidosis COPD HF Stage 4 Lung Cancer - Risk factors include HF, HTN, Age X2 - CHADSVASC=4.- not on AC in the setting of pancytopenia - related to his shock sequale and he is appropriately being treated with IVF and Antibiotics (giving an additional 1 L now) - presently would not tolerate AV nodal agents (may transition to metoprolol succinate as outpatient if he survive this acute decompensations) - AKI and getting azithyromycin, would not tolerated sotalol or Tikosyn - we may consider a one time dose of digoxin 0.25 mg, I suspect this will not make a significant different - IV amiodarone is reasonable: there is a risk of chemical cardioversion and stroke but if there is concern the tachycardia is worsening the hypotension we may be relegated to this option (poor long term option) - pressure applied and dressing placed: bleeding has stopped. Patient is being moved to ICU-> successfully transferred to ICU GCS > 8; BIPAP may be an option, his acidosis is a part of his decompensation  CRITICAL CARE Performed by: Oday Ridings A Ortencia Askari  Total critical care time: 60 minutes. Critical care time was exclusive of separately billable procedures and treating other patients. Critical care was necessary to treat or prevent imminent or life-threatening deterioration. Critical care was time spent personally by me on the following activities:  development of treatment plan with patient and/or surrogate as well as nursing, discussions with consultants, evaluation of patient's response to treatment, examination of patient, obtaining history from patient or surrogate, ordering and performing treatments and interventions, ordering and review of laboratory studies, ordering and review of radiographic studies, pulse oximetry and re-evaluation of patient's condition.    Signed, Rudean Haskell, MD [place]  2021-02-26 4:29 PM    Risk Assessment/Risk Scores:    For questions or updates, please contact [place] Please consult [email or web address] for contact info under     Signed, Werner Lean, MD  26-Feb-2021 3:04 PM

## 2021-02-25 NOTE — Progress Notes (Addendum)
Alerted that patient looking worse clinically Noted Lactic acid 5--->7   Bolus IVf Tx to SDU NCB confirmed c patient but will start Bipap 2/2 singificant tachypnea  I have asked patient son, Win to come visit him as I think this precipitous decline portend badly     5:48 PM Seen with son at bedside-Patient mentions being "tired"--Son mentions his [place] ris driving in from 1 hour away I have signed and held Comfort Protocol in event patient decides to pursue EOL pathway Charge RN made aware  >1 hour care coordination time   Verneita Griffes, MD [facility] 5:49 PM

## 2021-02-25 NOTE — Progress Notes (Addendum)
° ° °  OVERNIGHT PROGRESS REPORT  Notified by RN for Temp and tachycardia. Temp found to be 101.9F axillary. Tylenol given O2 via [place] added  Ice packs added. Bolus administered  Patient expresses some relief beginning at this time RN will recheck    Gershon Cull MSNA MSN [place] Nurse Practitioner [place]

## 2021-02-25 NOTE — Progress Notes (Signed)
Patient refusing additional IV sticks. "Patient verbally stated stop". Son at beside at this time. MD Samtani made aware. Waiting for other son to arrive to begin possibly comfort measures.

## 2021-02-25 NOTE — Death Summary Note (Addendum)
°  DEATH SUMMARY   Patient Details  Name: Joe Hill MRN: [id number] DOB: Aug 13, 1940  Admission/Discharge Information   Admit Date:  Mar 05, 2021  Date of Death: Date of Death: 07-Mar-2021  Time of Death: Time of Death: 26-Apr-2152  Length of Stay: 1  Code Status:   Referring Physician: [name], MD    Reason(s) for Hospitalization  Fall Neutropenic fever Overwhelming sepsis and acute hypoxic respiratory failure  Diagnoses  Preliminary cause of death:  Secondary Diagnoses (including complications and co-morbidities):  Principal Problem:   Syncope and collapse Active Problems:   Essential hypertension   GERD   PROSTATE CANCER, HX OF   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HCC)   Pancytopenia (HCC)   Persistent atrial fibrillation with RVR ([place])   DNR (do not resuscitate)   Chronic systolic CHF (congestive heart failure) ([place])   Foot ulcer, left, limited to breakdown of skin [facility])    [place] [facility] Course    81 y/o male Stage IV NSCLC/carboplatin, Alimta, Keytruda (last cycle 01/29/2021)-Dr. Mohammed HFrEF Partial vocal cord paralysis P A. fib/Eliquis COPD and smoking history Recent admission LLL ulcer 3 to 4 weeks-has been dressing this on his own  admission 12/29--01/25/2021 recurrent severe epistaxis after being admitted earlier that month-hospitalization complicated TCP Prior admission 12/18-12/19 LLL Pneumonia Prior to admission 12/2-12/8 multifocal pneumonia syncope A. fib RVR and significant pancytopenia  Readmit 2021-03-05 2 episodes syncope, 1 episode fell hit nose tip of lip--blood pressure 95/58 noted at triage received 500 cc fluids -Patient had complete loss of consciousness Lab =pancytopenia, hemoglobin 5.9, platelets 12,000,  imaging work-up = CT head CT spine negative for acute traumatic findings CT chest negative for PE stable LUL mass  Oncology consulted-Granix started Patient subsequently had a fall in the emergency room 02/11/2021 AM with no deficit was  had with CT head on 118 and he had no bleeding noted  became more tachypneic 03-07-22 febrile  to 103 and was pancultured because his ANC count was 200 CT obtained 03-07-22 was negative for retroperitoneal hemorrhage Patient progressively declined and decision was made for time-limited trial of pressors-patient was started on BiPAP for support in the ICU setting Vasopressors also started via peripheral IV Long discussion with family ensued--they understand clearly life limiting nature of stage IV cancer.   Ultimately it was decided to pursue comfort care and time of death was 04/26/2152           Nita Sells 02/13/2021, 10:40 AM

## 2021-02-25 NOTE — Progress Notes (Addendum)
PROGRESS NOTE   Joe Hill  UUV:[id number] DOB: 1940/03/24 DOA: 01/29/2021 PCP: [name], MD  Brief Narrative:   81 y/o male Stage IV NSCLC/carboplatin, Alimta, Keytruda (last cycle 01/29/2021)-Dr. Mohammed HFrEF Partial vocal cord paralysis P A. fib/Eliquis COPD and smoking history Recent admission LLL ulcer 3 to 4 weeks-has been dressing this on his own  admission 12/29--01/25/2021 recurrent severe epistaxis after being admitted earlier that month-hospitalization complicated TCP Prior admission 12/18-12/19 LLL Pneumonia Prior to admission 12/2-12/8 multifocal pneumonia syncope A. fib RVR and significant pancytopenia  Readmit 02/21/2021 2 episodes syncope, 1 episode fell hit nose tip of lip--blood pressure 95/58 noted at triage received 500 cc fluids -Patient had complete loss of consciousness Lab =pancytopenia, hemoglobin 5.9, platelets 12,000,  imaging work-up = CT head CT spine negative for acute traumatic findings CT chest negative for PE stable LUL mass  Oncology consulted-Granix started Patient subsequently had a fall in the emergency room 02/11/2021 AM with no deficit  Overnight 1/19 developed 103 degree fever, A. 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 fib RVR on admission-no Eliquis , ? hasbled Magnesium 1.5--will replace Aggressively with IV 2g now and repeat in a.m. Start Amio bolus and Gtt--I discussed the case with Dr. Glenford Bayley  Cardiology we feel that the risk of QT prolongation etc. is outweighed by the benefit of rhythm control Accidental fall , Low back pain L side CT head on admission negative CT abdomen pelvis 1/19 negative for retroperitoneal hematoma Syncope--multifactorial-- severe pancytopenia, unlikely this is arrhythmia 2 episodes of fall prior to admission Transfused 2 units PRBC 1/17, 1 unit platelets 1/17 Transfuse 1 more units platelets 1/18 Keep counts >7 Foot ulcer 2.5X 5.0 cm present on admission--does not appear infected when I evaluated Vaseline gauze cover with foam dressings changed every 3 daily, daily Santyl Appreciate wound nurse input HFrEF with no acute decompensation and compensated at this time Monitor only  DVT prophylaxis: SCD Code Status: DNR Family Communication:  Larence, Th[place] [phone]  Long discussion with son--understands scenario and gravity of illness Disposition:  Status is: inpatient    Consultants:  Oncology Cardiology-we will formally see on 1/20  Procedures: None  Antimicrobials:  Cefepime azithromycin from 1/19  Subjective:  Events overnight noted-high-grade fevers as well as low back pain No cough no cold no dysuria  Main complaint is feeling weak  Objective: Vitals:   03/03/21 0753 2021/03/03 1119 03-03-21 1249 03-03-21 1434  BP: 94/65 (!) 87/66 (!) 97/49 91/61  Pulse: (!) 152 (!) 136 (!) 128   Resp: 20 (!) 24    Temp: 97.6 F (36.4 C) 98 F (36.7 C)    TempSrc: Oral Oral    SpO2: 95% 100%    Weight:      Height:        Intake/Output Summary (Last 24 hours) at March 03, 2021 1442 Last data filed at 2021/03/03 1245 Gross per 24 hour  Intake 971.98 ml  Output 325 ml  Net 646.98 ml    Filed Weights   02/06/2021 1455 02/11/21 1526  Weight: 79 kg 71.3 kg  Examination:  Asthenic frail white male in some cardio respiratory embarrassment S1-S2 no murmur no rub no gallop Abdomen soft nontender no rebound no guarding I did not examine  wound today Abdomen is soft no rebound no guarding  Data Reviewed: personally reviewed   CBC    Component Value Date/Time   WBC 0.5 (LL) February 17, 2021 0310   RBC 2.17 (L) 02-17-21 0310   HGB 7.1 (L) February 17, 2021 0310   HGB 8.8 (L) 02/05/2021 1509   HGB 15.7 11/04/2020 0840   HCT 20.5 (L) 02-17-2021 0310   HCT 44.5 11/04/2020 0840   PLT 20 (LL) Feb 17, 2021 0310   PLT 96 (L) 02/05/2021 1509   PLT 252 11/04/2020 0840   MCV 94.5 17-Feb-2021 0310   MCV 93 11/04/2020 0840   MCH 32.7 2021/02/17 0310   MCHC 34.6 2021/02/17 0310   RDW 20.9 (H) 02/17/21 0310   RDW 12.1 11/04/2020 0840   LYMPHSABS 0.1 (L) 2021-02-17 0310   MONOABS 0.1 2021-02-17 0310   EOSABS 0.0 2021/02/17 0310   BASOSABS 0.0 02/17/21 0310   CMP Latest Ref Rng & Units 17-Feb-2021 02/11/2021 02/24/2021  Glucose 70 - 99 mg/dL 116(H) 115(H) 133(H)  BUN 8 - 23 mg/dL 47(H) 39(H) 35(H)  Creatinine 0.61 - 1.24 mg/dL 1.50(H) 1.18 1.14  Sodium 135 - 145 mmol/L 140 138 138  Potassium 3.5 - 5.1 mmol/L 3.3(L) 4.1 3.7  Chloride 98 - 111 mmol/L 104 103 104  CO2 22 - 32 mmol/L 25 25 28   Calcium 8.9 - 10.3 mg/dL 8.4(L) 8.5(L) 8.6(L)  Total Protein 6.5 - 8.1 g/dL 6.1(L) 6.3(L) 6.2(L)  Total Bilirubin 0.3 - 1.2 mg/dL 1.7(H) 2.7(H) 1.2  Alkaline Phos 38 - 126 U/L 87 96 92  AST 15 - 41 U/L 40 45(H) 25  ALT 0 - 44 U/L 40 43 30     Radiology Studies: CT ABDOMEN PELVIS WO CONTRAST  Result Date: 17-Feb-2021 CLINICAL DATA:  Retroperitoneal hematoma, known or suspected; low back pain--low platelets EXAM: CT ABDOMEN AND PELVIS WITHOUT CONTRAST TECHNIQUE: Multidetector CT imaging of the abdomen and pelvis was performed following the standard protocol without IV contrast. RADIATION DOSE REDUCTION: This exam was performed according to the departmental dose-optimization program which includes automated exposure control, adjustment of the mA and/or kV according to patient size and/or use of iterative reconstruction technique. COMPARISON:  2015,  chest CT 02/01/2021 FINDINGS: Lower chest: Similar appearance of emphysema and interstitial changes. Increased patchy atelectasis/consolidation at the lung bases. Hepatobiliary: No focal liver abnormality. Cholelithiasis. No biliary dilatation. Pancreas: Unremarkable. Spleen: Unremarkable. Adrenals/Urinary Tract: Unchanged nodular thickening of the adrenals. No hydronephrosis. Increased density within the bladder. Stomach/Bowel: Stomach is within normal limits. Bowel is normal in caliber. Vascular/Lymphatic: Atherosclerosis.  No enlarged nodes. Reproductive: Unremarkable. Other: Small volume simple ascites. Musculoskeletal: Degenerative changes of the included spine. Degenerative changes of the hips. No acute abnormality. IMPRESSION: No retroperitoneal hematoma. Increased density of the bladder lumen may reflect residual contrast from 02/14/2021 injection or proteinaceous material including hemorrhage. Increased patchy atelectasis/consolidation at the lung bases. Small volume simple ascites. Cholelithiasis. Electronically Signed   By: Macy Mis M.D.   On: 2021-02-17 11:10   CT Head Wo Contrast  Result Date: 02/11/2021 CLINICAL DATA:  Head trauma.  Status post fall. EXAM: CT HEAD WITHOUT CONTRAST TECHNIQUE: Contiguous axial images were obtained from the base of the skull through the vertex without intravenous contrast. RADIATION DOSE REDUCTION: This exam was performed according to the departmental dose-optimization program which includes automated exposure control, adjustment of  the mA and/or kV according to patient size and/or use of iterative reconstruction technique. COMPARISON:  02/13/2021 FINDINGS: Brain: No evidence of acute infarction, hemorrhage, hydrocephalus, extra-axial collection or mass lesion/mass effect. There is mild diffuse low-attenuation within the subcortical and periventricular white matter compatible with chronic microvascular disease. Prominence of the sulci and ventricles compatible  with brain atrophy. Vascular: No hyperdense vessel or unexpected calcification. Skull: Normal. Negative for fracture or focal lesion. Sinuses/Orbits: No acute finding. Other: None IMPRESSION: 1. No acute intracranial abnormalities. 2. 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 Electronically Signed   By: Kerby Moors M.D.   On: 02/11/2021 06:28   CT Angio Chest PE W and/or Wo Contrast  Result Date: 01/26/2021 CLINICAL DATA:  Chest pain.  Elevated D-dimer. EXAM: CT ANGIOGRAPHY CHEST WITH CONTRAST TECHNIQUE: Multidetector CT imaging of the chest was performed using the standard protocol during bolus administration of intravenous contrast. Multiplanar CT image reconstructions and MIPs were obtained to evaluate the vascular anatomy. RADIATION DOSE REDUCTION: This exam was performed according to the departmental dose-optimization program which includes automated exposure control, adjustment of the mA and/or kV according to patient size and/or use of iterative reconstruction technique. CONTRAST:  49mL OMNIPAQUE IOHEXOL 300 MG/ML  SOLN COMPARISON:  12/27/2020 FINDINGS: Cardiovascular: The heart is normal in size. No pericardial effusion. Stable tortuosity, ectasia and calcification of the thoracic aorta. Stable three-vessel coronary artery calcifications. The pulmonary arterial tree is well opacified. No filling defects to suggest pulmonary embolism. Mediastinum/Nodes: No mediastinal or hilar mass or lymphadenopathy. Small scattered nodes are stable. The esophagus is grossly normal. Lungs/Pleura: Stable underlying emphysematous changes and pulmonary scarring. Stable medial left upper lobe lung mass measuring a maximum of 2.5 cm on image 47/10. Less pronounced soft tissue invading the mediastinum. Improving left lower lobe infiltrate. No pleural effusions. Upper Abdomen: Stable bilateral adrenal gland metastasis. No definite hepatic lesions. Stable vascular calcifications. Musculoskeletal: No chest wall  mass, supraclavicular or axillary adenopathy. The bony thorax is intact. Healing lower sternal fracture. Review of the MIP images confirms the above findings. IMPRESSION: 1. No CT findings for pulmonary embolism. 2. Stable tortuosity, ectasia and calcification of the thoracic aorta. 3. Stable three-vessel coronary artery calcifications. 4. Stable medial left upper lobe lung mass. 5. Improving left lower lobe infiltrate. 6. Stable bilateral adrenal gland metastasis. 7. Stable emphysematous changes and pulmonary scarring. Aortic Atherosclerosis (ICD10-I70.0) and Emphysema (ICD10-J43.9). Electronically Signed   By: Marijo Sanes M.D.   On: 02/15/2021 16:28   DG CHEST PORT 1 VIEW  Result Date: February 25, 2021 CLINICAL DATA:  Pneumonia follow-up. EXAM: PORTABLE CHEST 1 VIEW COMPARISON:  CT a chest in chest x-ray dated February 10, 2021. FINDINGS: The heart size and mediastinal contours are within normal limits. Diffuse reticulonodular interstitial opacities, new since chest x-ray from 2 days ago. No pneumothorax or large pleural effusion. No acute osseous abnormality. IMPRESSION: 1. Diffuse reticulonodular interstitial opacities, new since chest x-ray from 2 days ago, concerning for atypical infection. Electronically Signed   By: Titus Dubin M.D.   On: 02/25/2021 12:58     Scheduled Meds:  amiodarone  150 mg Intravenous Once   azithromycin  500 mg Oral Daily   collagenase   Topical Daily   diltiazem  180 mg Oral Daily   diltiazem  30 mg Oral I3J   folic acid  1 mg Oral Daily   lactose free nutrition  1 Container Oral BID BM   sodium chloride flush  3 mL Intravenous Q12H   Tbo-filgastrim (  GRANIX) SQ  480 mcg Subcutaneous q1800   Continuous Infusions:  sodium chloride 100 mL/hr at 02/17/21 1222   amiodarone     Followed by   amiodarone     ceFEPime (MAXIPIME) IV 2 g (02-17-2021 1306)   vancomycin 1,000 mg (02/17/2021 1436)   vancomycin        LOS: 1 day   Time spent: 60 including care coordination  time  CRITICAL CARE Performed by: Nita Sells   Total critical care time: 35 minutes  Critical care time was exclusive of separately billable procedures and treating other patients.  Critical care was necessary to treat or prevent imminent or life-threatening deterioration.  Critical care was time spent personally by me on the following activities: development of treatment plan with patient and/or surrogate as well as nursing, discussions with consultants, evaluation of patient's response to treatment, examination of patient, obtaining history from patient or surrogate, ordering and performing treatments and interventions, ordering and review of laboratory studies, ordering and review of radiographic studies, pulse oximetry and re-evaluation of patient's condition.   Nita Sells, MD [facility] To contact the attending provider between 7A-7P or the covering provider during after hours 7P-7A, please log into the web site [email or web address] and access using universal [place] password for that web site. If you do not have the password, please call the hospital operator.  2021-02-17, 2:43 PM

## 2021-02-25 NOTE — Progress Notes (Signed)
PT Cancellation Note  Patient Details Name: Joe Hill MRN: [id number] DOB: March 10, 1940   Cancelled Treatment:    Reason Eval/Treat Not Completed: Medical issues which prohibited therapy (Pt wiht new onset Afib and RVR. Remains tachy wtih HR in 140's at rest. Will follow up at later date/time as pt able and schedule allows.)  Verner Mould, DPT [place] Office [phone] Pager [phone]

## 2021-02-25 DEATH — deceased

## 2021-03-04 ENCOUNTER — Inpatient Hospital Stay: Payer: Medicare Other

## 2021-03-11 ENCOUNTER — Ambulatory Visit: Payer: Medicare Other | Admitting: Physician Assistant

## 2021-03-11 ENCOUNTER — Other Ambulatory Visit: Payer: Medicare Other

## 2021-03-11 ENCOUNTER — Ambulatory Visit: Payer: Medicare Other

## 2021-03-18 ENCOUNTER — Other Ambulatory Visit: Payer: Medicare Other

## 2021-03-25 ENCOUNTER — Other Ambulatory Visit: Payer: Medicare Other

## 2021-04-01 ENCOUNTER — Other Ambulatory Visit: Payer: Medicare Other

## 2021-04-01 ENCOUNTER — Ambulatory Visit: Payer: Medicare Other

## 2021-04-01 ENCOUNTER — Ambulatory Visit: Payer: Medicare Other | Admitting: Internal Medicine

## 2021-04-08 ENCOUNTER — Other Ambulatory Visit: Payer: Medicare Other

## 2021-04-15 ENCOUNTER — Other Ambulatory Visit: Payer: Medicare Other

## 2021-04-23 ENCOUNTER — Other Ambulatory Visit: Payer: Medicare Other

## 2021-04-23 ENCOUNTER — Ambulatory Visit: Payer: Medicare Other | Admitting: Physician Assistant

## 2021-04-23 ENCOUNTER — Ambulatory Visit: Payer: Medicare Other

## 2021-05-12 ENCOUNTER — Ambulatory Visit: Payer: Medicare Other | Admitting: Family Medicine

## 2022-06-27 IMAGING — CT CT HEAD W/O CM
3 of 6 series · 12 of 47 positions shown, 14 images · non-contrast
Comparison: 02/10/2021

CLINICAL DATA: Head trauma.  Status post fall.



[Series 2: head wo · axial · 0.47mm/px · z∈[-80,+50]mm · 7 of 32 slices shown, 9 images]
[im 3/32  brain]
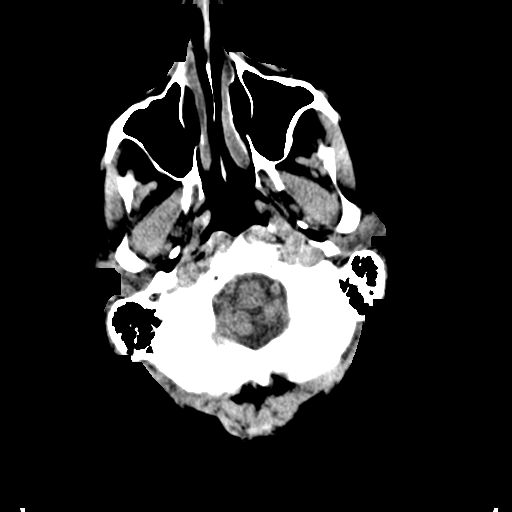
[im 3/32  bone]
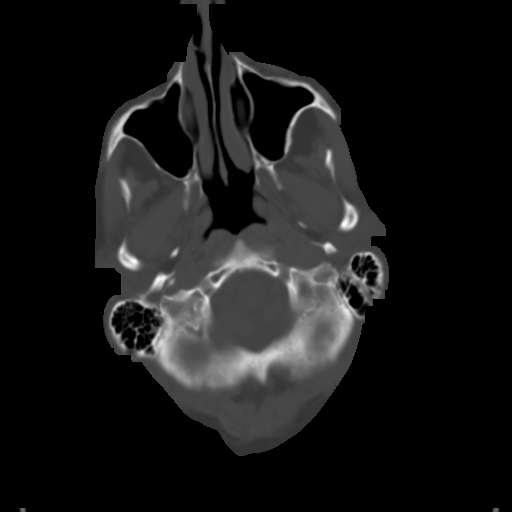
[im 8/32  brain]
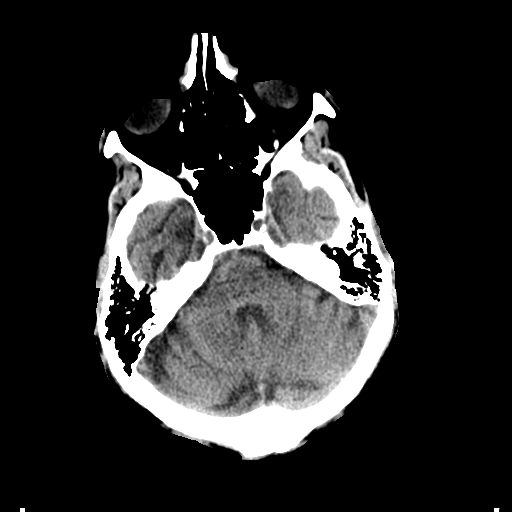
[im 12/32  brain]
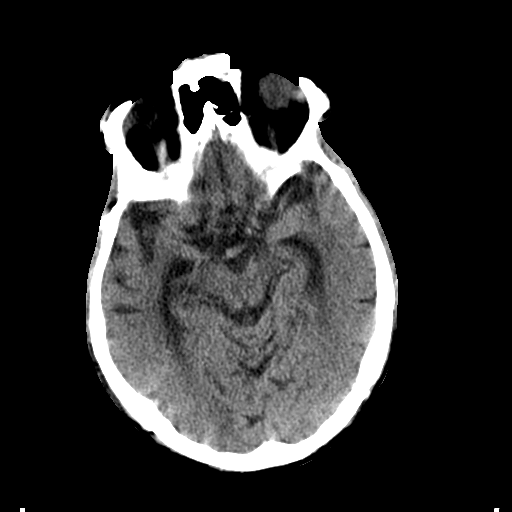
[im 17/32  brain]
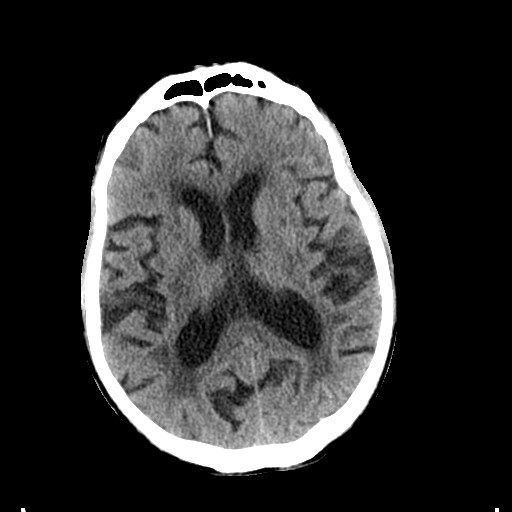
[im 20/32  brain]
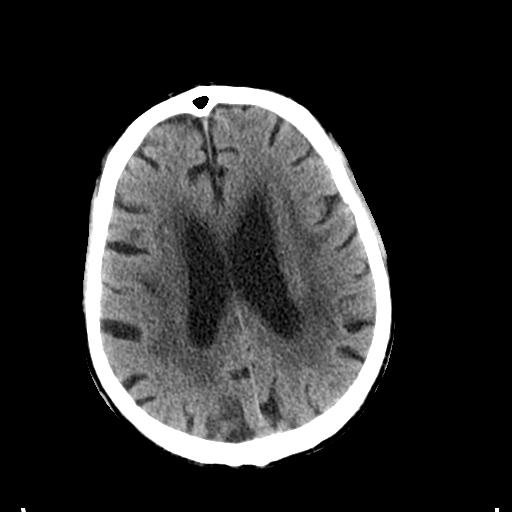
[im 20/32  bone]
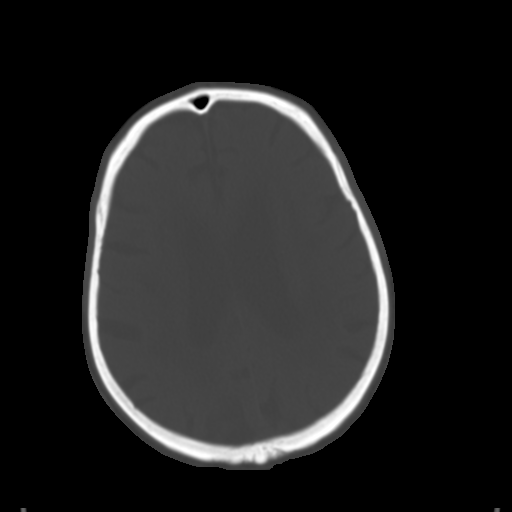
[im 24/32  brain]
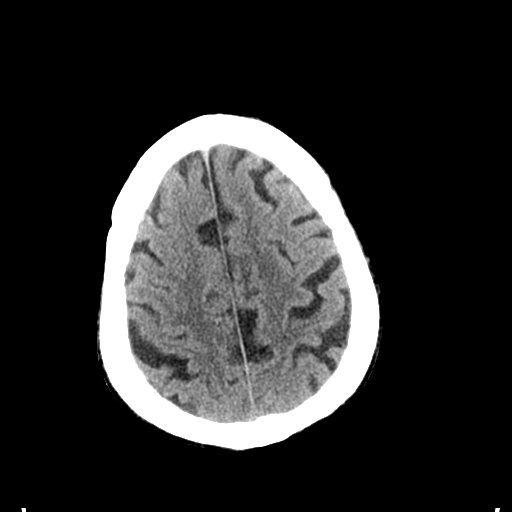
[im 29/32  brain]
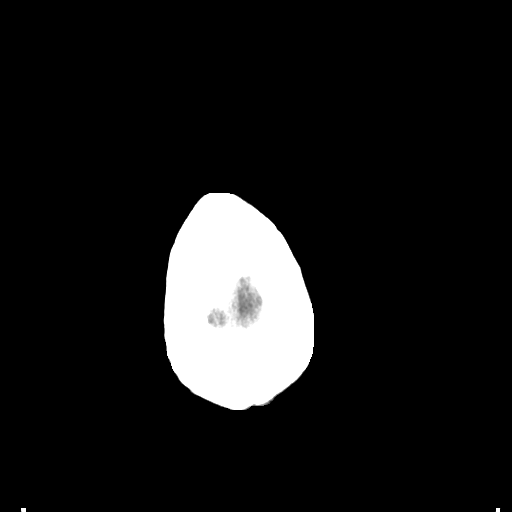

[Series 4: coronal soft tissue · coronal · 0.30mm/px · 3 of 69 slices shown]
[im 14/69  brain]
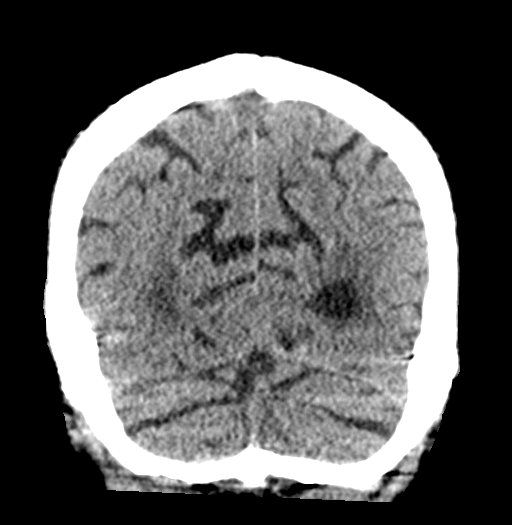
[im 28/69  brain]
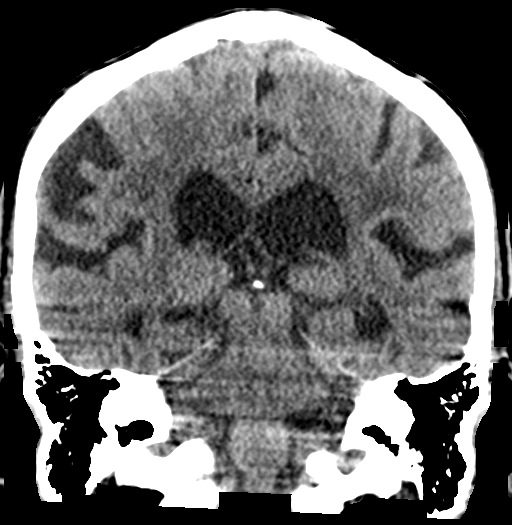
[im 41/69  brain]
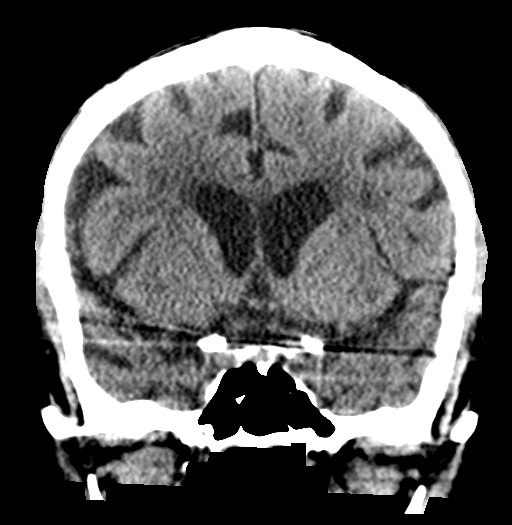

[Series 10: sagittal soft tissue · sagittal · 0.10mm/px · 2 of 52 slices shown]
[im 18/52  brain]
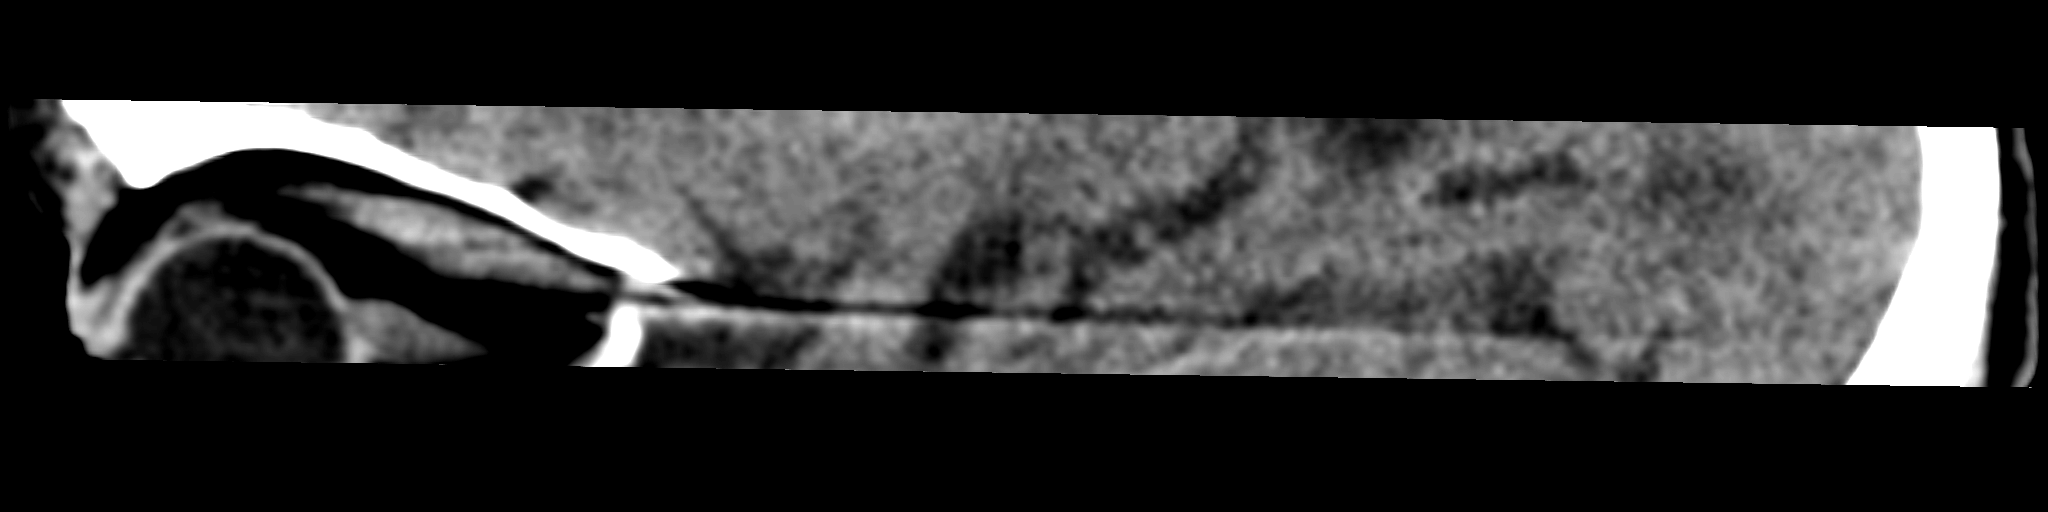
[im 35/52  brain]
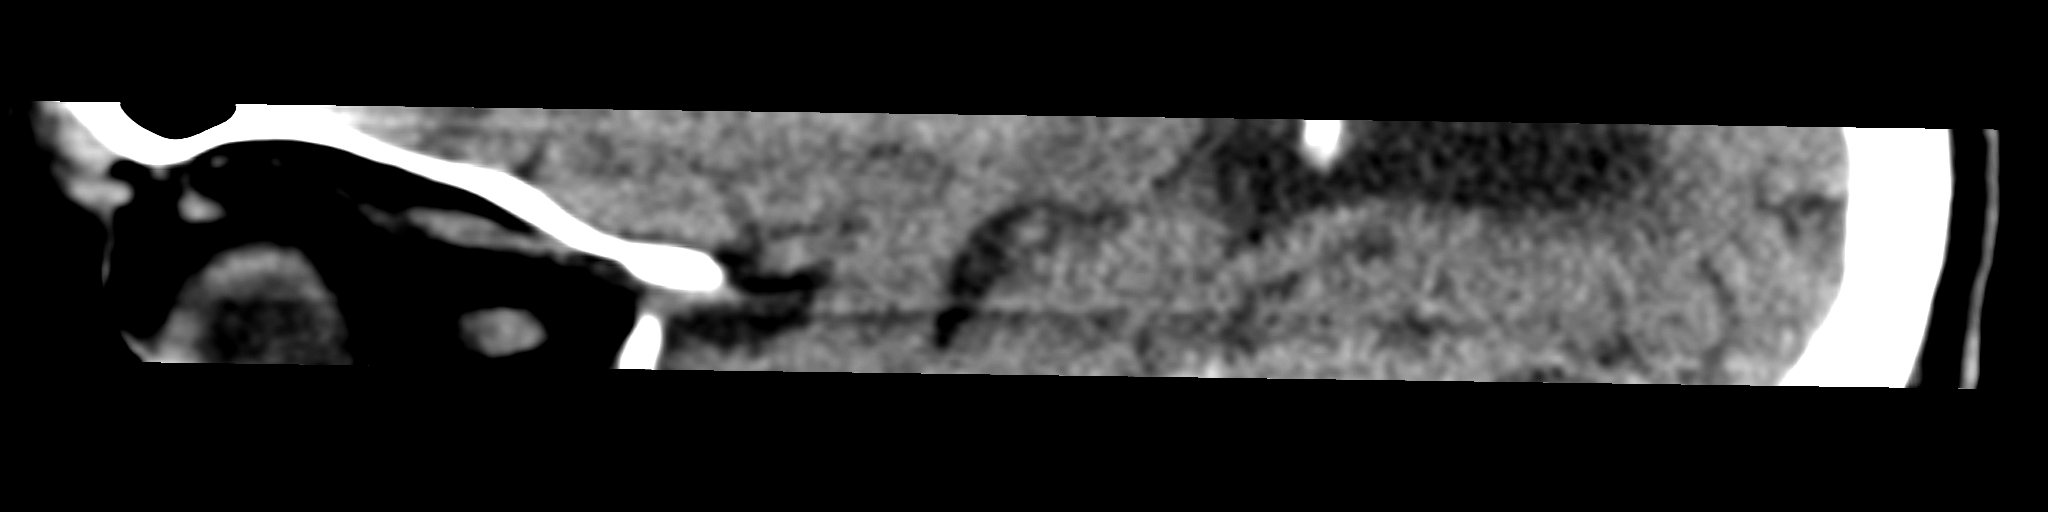

[12 of 47 positions shown; findings below may reference images not displayed]

FINDINGS: Brain: No evidence of acute infarction, hemorrhage, hydrocephalus,
extra-axial collection or mass lesion/mass effect. There is mild
diffuse low-attenuation within the subcortical and periventricular
white matter compatible with chronic microvascular disease.
Prominence of the sulci and ventricles compatible with brain
atrophy.

Vascular: No hyperdense vessel or unexpected calcification.

Skull: Normal. Negative for fracture or focal lesion.

Sinuses/Orbits: No acute finding.

Other: None
IMPRESSION: 1. No acute intracranial abnormalities.
2. Chronic small vessel ischemic disease and brain atrophy.

## 2022-06-28 IMAGING — DX DG CHEST 1V PORT
1 series · 1 of 1 positions shown · non-contrast
Comparison: CT a chest in chest x-ray dated February 10, 2021.

CLINICAL DATA: Pneumonia follow-up.

EXAM:
PORTABLE CHEST 1 VIEW

[chest ap]
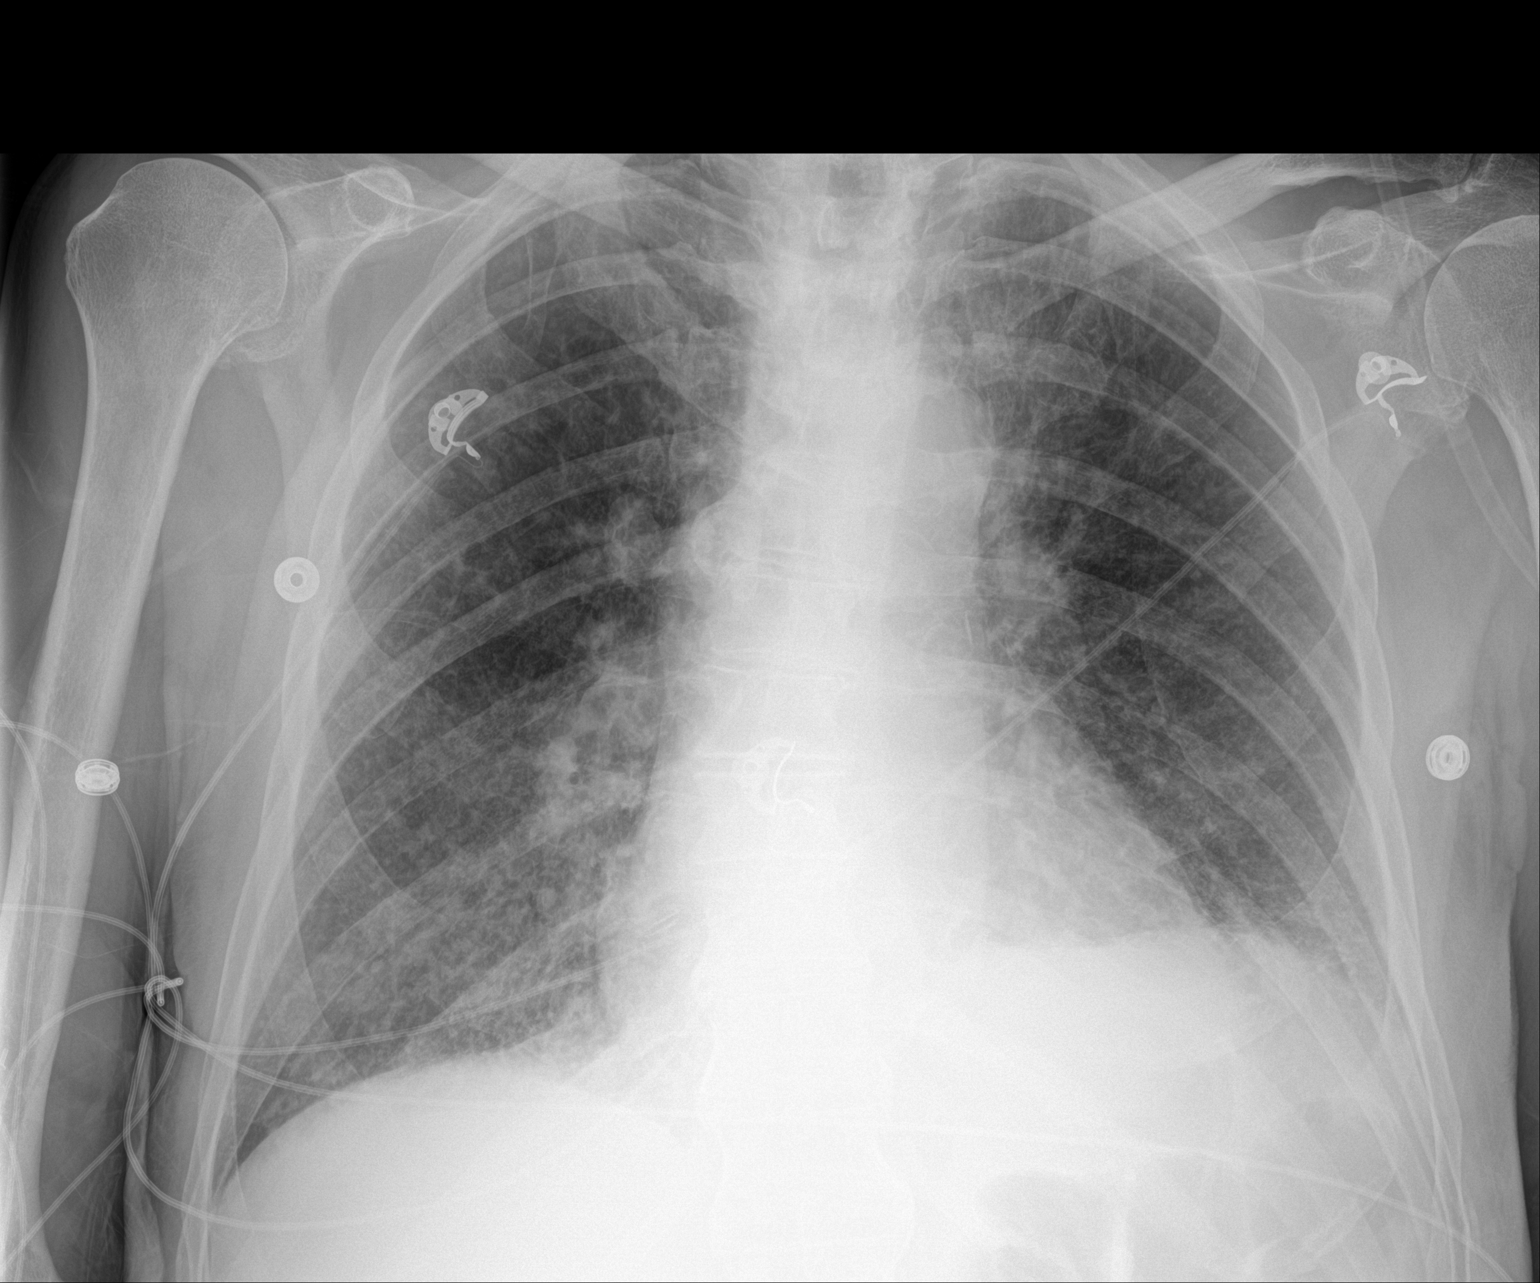

[1 of 1 positions shown; findings below may reference images not displayed]

FINDINGS: The heart size and mediastinal contours are within normal limits.
Diffuse reticulonodular interstitial opacities, new since chest
x-ray from 2 days ago. No pneumothorax or large pleural effusion. No
acute osseous abnormality.
IMPRESSION: 1. Diffuse reticulonodular interstitial opacities, new since chest
x-ray from 2 days ago, concerning for atypical infection.

## 2022-06-28 IMAGING — CT CT ABD-PELV W/O CM
2 of 4 series · 16 of 46 positions shown, 18 images · non-contrast
Comparison: [id number], chest CT 02/10/2021

CLINICAL DATA: Retroperitoneal hematoma, known or suspected; low
back pain--low platelets



[Series 2: axial st · axial · 0.81mm/px · z∈[-542,-112]mm · 13 of 98 slices shown, 15 images]
[im 6/98  soft-tissue]
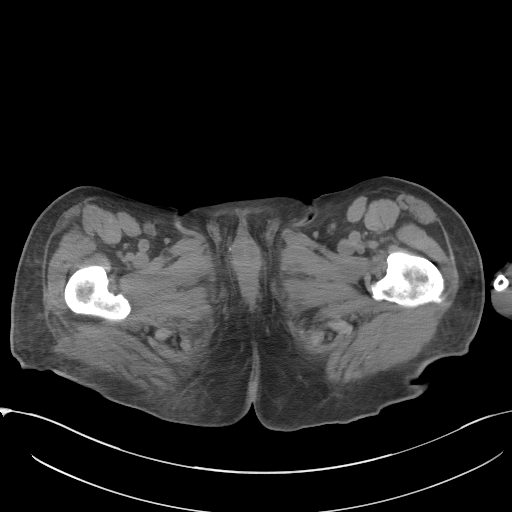
[im 6/98  bone]
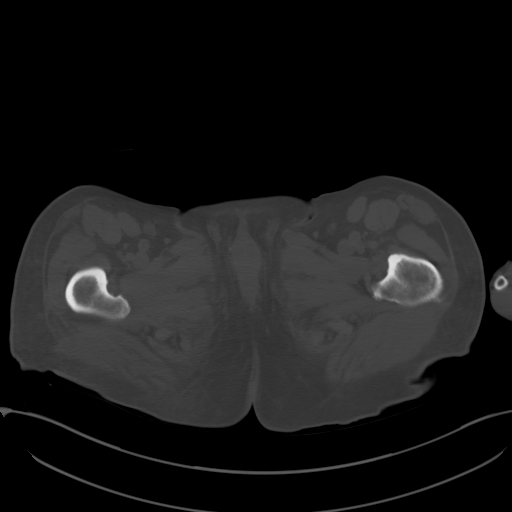
[im 12/98  soft-tissue]
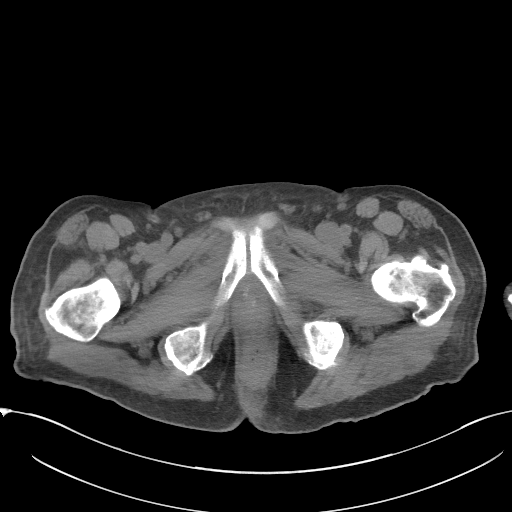
[im 23/98  soft-tissue]
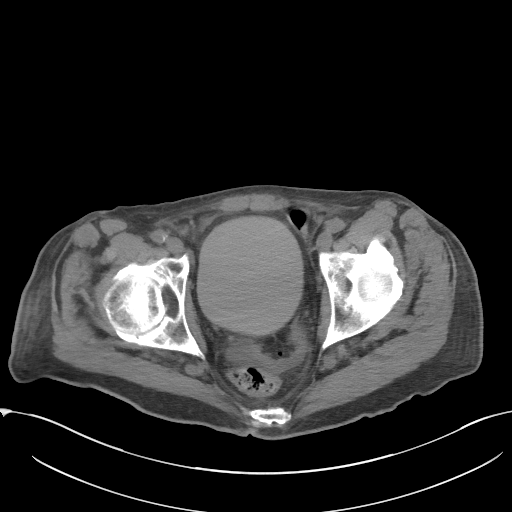
[im 29/98  soft-tissue]
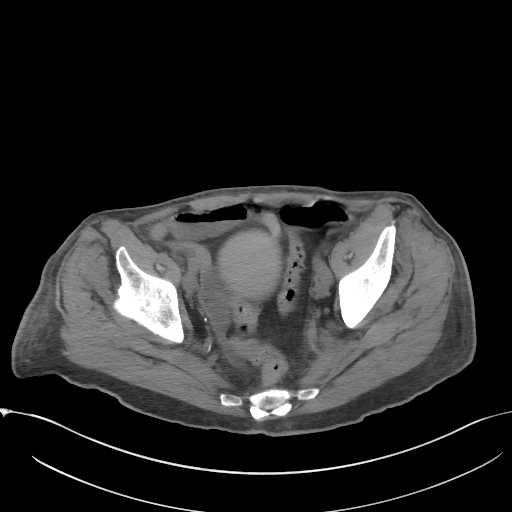
[im 35/98  soft-tissue]
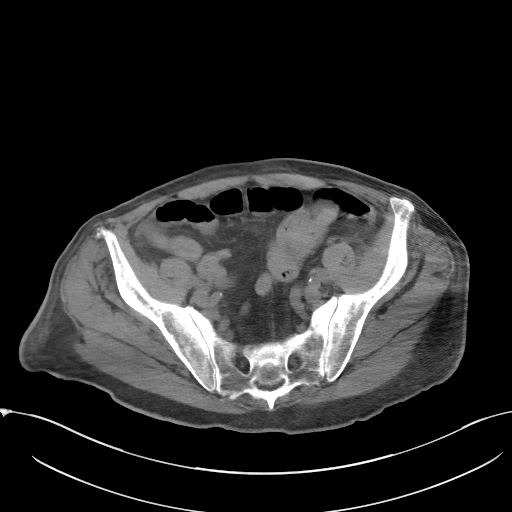
[im 40/98  soft-tissue]
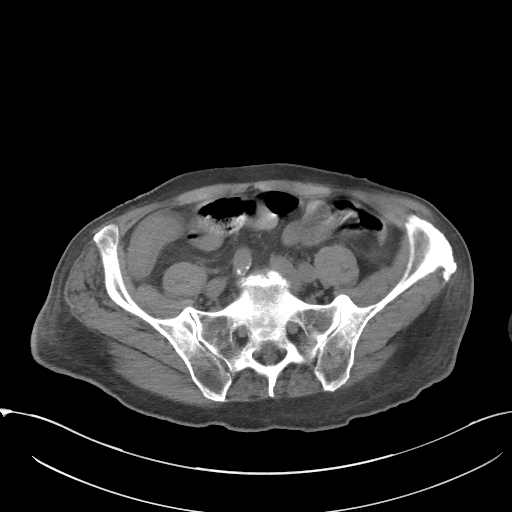
[im 52/98  soft-tissue]
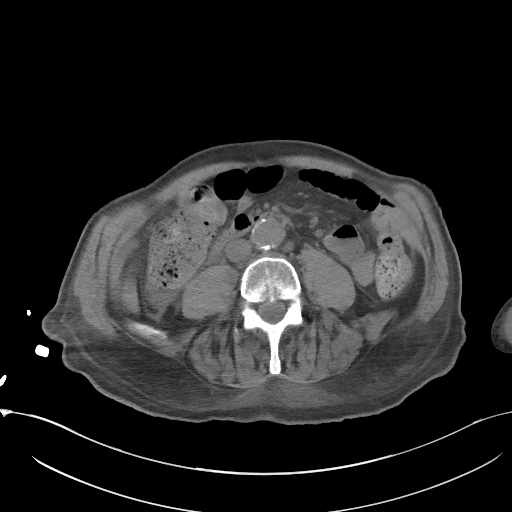
[im 58/98  soft-tissue]
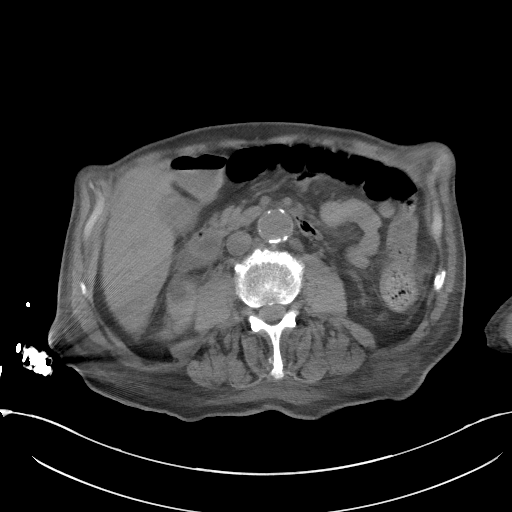
[im 63/98  soft-tissue]
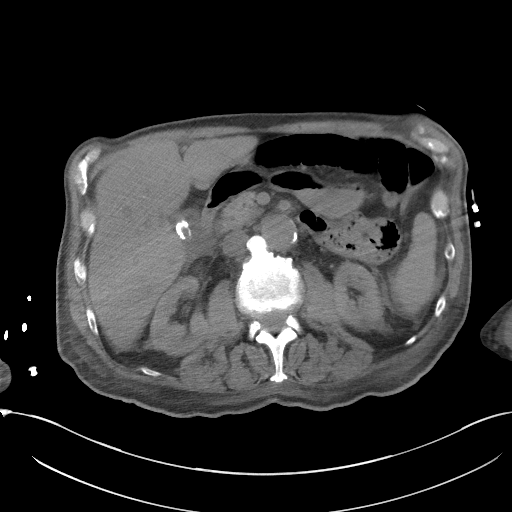
[im 63/98  bone]
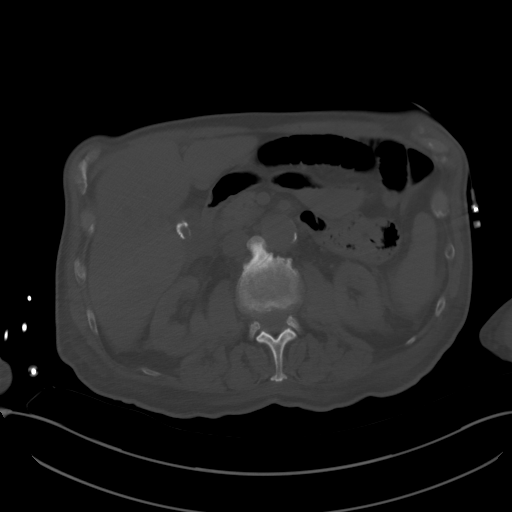
[im 69/98  soft-tissue]
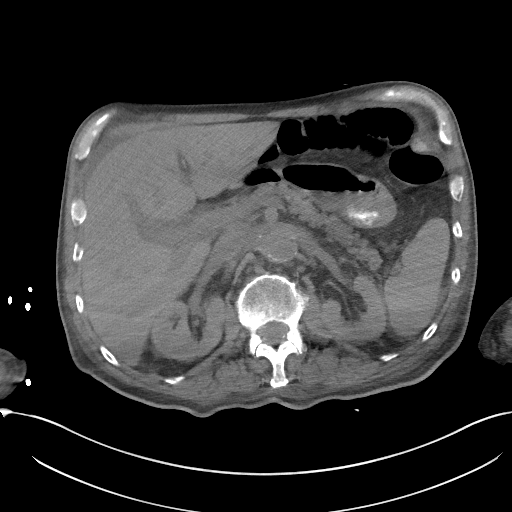
[im 75/98  soft-tissue]
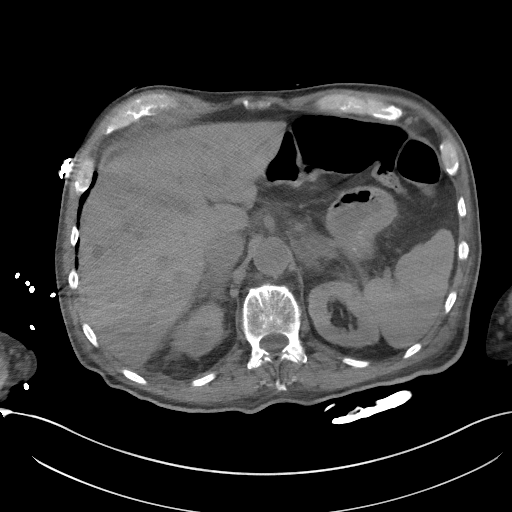
[im 86/98  soft-tissue]
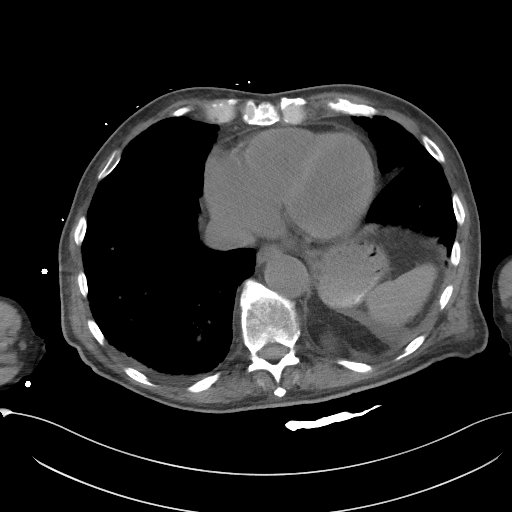
[im 92/98  soft-tissue]
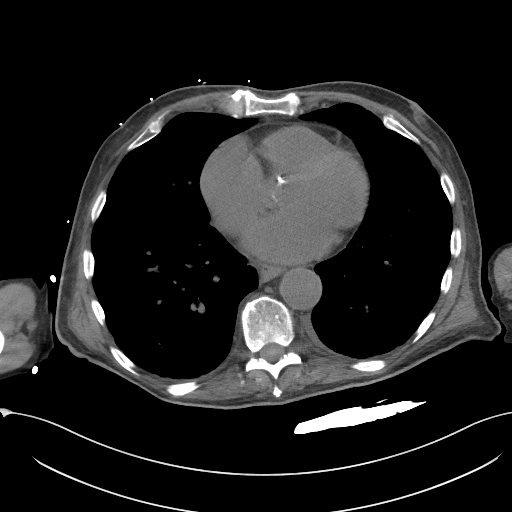

[Series 4: coronal st · coronal · 0.91mm/px · 3 of 99 slices shown]
[im 33/99  soft-tissue]
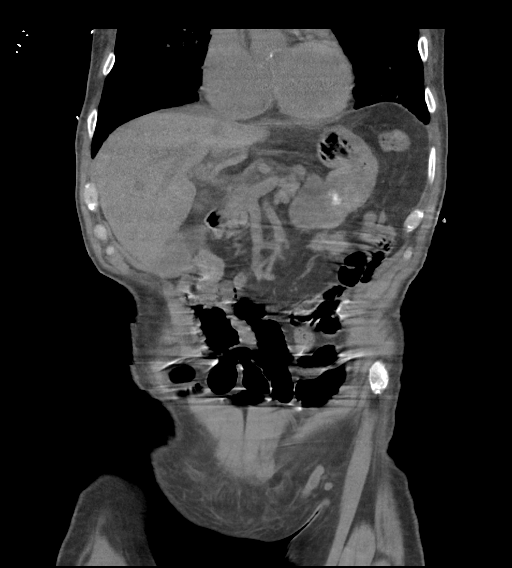
[im 44/99  soft-tissue]
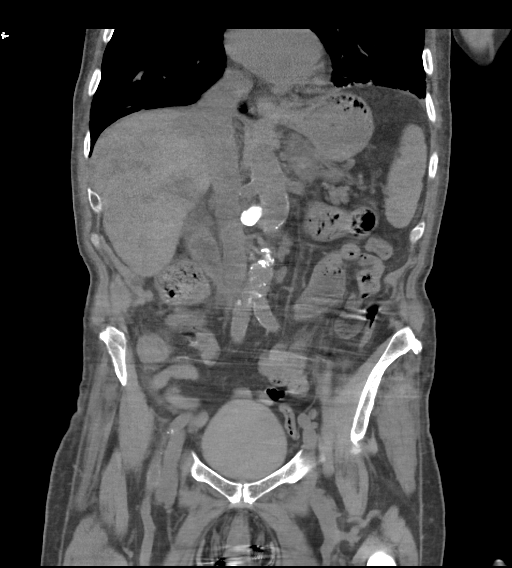
[im 55/99  soft-tissue]
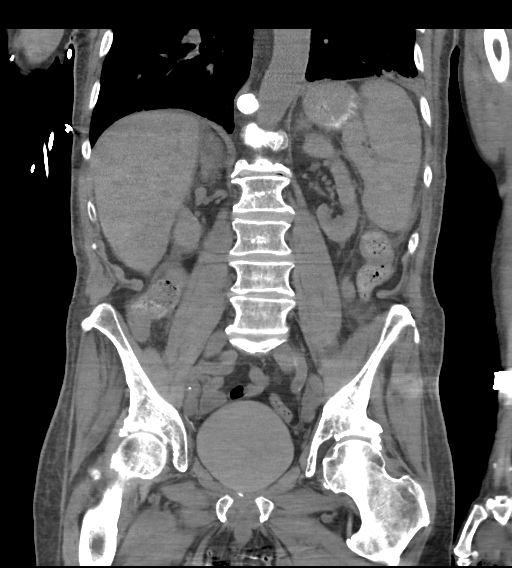

[16 of 46 positions shown; findings below may reference images not displayed]

FINDINGS: Lower chest: Similar appearance of emphysema and interstitial
changes. Increased patchy atelectasis/consolidation at the lung
bases.

Hepatobiliary: No focal liver abnormality. Cholelithiasis. No
biliary dilatation.

Pancreas: Unremarkable.

Spleen: Unremarkable.

Adrenals/Urinary Tract: Unchanged nodular thickening of the
adrenals. No hydronephrosis. Increased density within the bladder.

Stomach/Bowel: Stomach is within normal limits. Bowel is normal in
caliber.

Vascular/Lymphatic: Atherosclerosis.  No enlarged nodes.

Reproductive: Unremarkable.

Other: Small volume simple ascites.

Musculoskeletal: Degenerative changes of the included spine.
Degenerative changes of the hips. No acute abnormality.
IMPRESSION: No retroperitoneal hematoma.

Increased density of the bladder lumen may reflect residual contrast
from 02/10/2021 injection or proteinaceous material including
hemorrhage.

Increased patchy atelectasis/consolidation at the lung bases.

Small volume simple ascites.

Cholelithiasis.
